# Patient Record
Sex: Female | Born: 1955 | Race: White | Hispanic: No | Marital: Married
Health system: Midwestern US, Community
[De-identification: ages and names within clinical notes are randomized; demographics above are authoritative.]

## PROBLEM LIST (undated history)

## (undated) DIAGNOSIS — M25531 Pain in right wrist: Principal | ICD-10-CM

## (undated) DIAGNOSIS — E1140 Type 2 diabetes mellitus with diabetic neuropathy, unspecified: Secondary | ICD-10-CM

## (undated) DIAGNOSIS — N952 Postmenopausal atrophic vaginitis: Secondary | ICD-10-CM

## (undated) DIAGNOSIS — E785 Hyperlipidemia, unspecified: Secondary | ICD-10-CM

## (undated) DIAGNOSIS — R928 Other abnormal and inconclusive findings on diagnostic imaging of breast: Secondary | ICD-10-CM

## (undated) DIAGNOSIS — I10 Essential (primary) hypertension: Principal | ICD-10-CM

## (undated) DIAGNOSIS — Z794 Long term (current) use of insulin: Secondary | ICD-10-CM

## (undated) DIAGNOSIS — E1165 Type 2 diabetes mellitus with hyperglycemia: Principal | ICD-10-CM

## (undated) DIAGNOSIS — E119 Type 2 diabetes mellitus without complications: Principal | ICD-10-CM

## (undated) DIAGNOSIS — K529 Noninfective gastroenteritis and colitis, unspecified: Principal | ICD-10-CM

## (undated) DIAGNOSIS — E559 Vitamin D deficiency, unspecified: Principal | ICD-10-CM

## (undated) DIAGNOSIS — G2581 Restless legs syndrome: Secondary | ICD-10-CM

## (undated) DIAGNOSIS — J340 Abscess, furuncle and carbuncle of nose: Secondary | ICD-10-CM

## (undated) DIAGNOSIS — F411 Generalized anxiety disorder: Secondary | ICD-10-CM

## (undated) DIAGNOSIS — K219 Gastro-esophageal reflux disease without esophagitis: Secondary | ICD-10-CM

## (undated) DIAGNOSIS — Z1159 Encounter for screening for other viral diseases: Secondary | ICD-10-CM

## (undated) DIAGNOSIS — R110 Nausea: Principal | ICD-10-CM

## (undated) DIAGNOSIS — J309 Allergic rhinitis, unspecified: Secondary | ICD-10-CM

## (undated) DIAGNOSIS — Z86010 Personal history of colonic polyps: Secondary | ICD-10-CM

## (undated) DIAGNOSIS — K8050 Calculus of bile duct without cholangitis or cholecystitis without obstruction: Secondary | ICD-10-CM

## (undated) DIAGNOSIS — F32A Depression, unspecified: Secondary | ICD-10-CM

## (undated) DIAGNOSIS — T7840XA Allergy, unspecified, initial encounter: Secondary | ICD-10-CM

## (undated) DIAGNOSIS — G589 Mononeuropathy, unspecified: Secondary | ICD-10-CM

## (undated) DIAGNOSIS — M1990 Unspecified osteoarthritis, unspecified site: Secondary | ICD-10-CM

## (undated) DIAGNOSIS — M797 Fibromyalgia: Secondary | ICD-10-CM

## (undated) DIAGNOSIS — G43909 Migraine, unspecified, not intractable, without status migrainosus: Secondary | ICD-10-CM

## (undated) DIAGNOSIS — F419 Anxiety disorder, unspecified: Secondary | ICD-10-CM

## (undated) DIAGNOSIS — K2970 Gastritis, unspecified, without bleeding: Secondary | ICD-10-CM

## (undated) DIAGNOSIS — M810 Age-related osteoporosis without current pathological fracture: Secondary | ICD-10-CM

## (undated) DIAGNOSIS — I509 Heart failure, unspecified: Secondary | ICD-10-CM

## (undated) DIAGNOSIS — N951 Menopausal and female climacteric states: Secondary | ICD-10-CM

## (undated) DIAGNOSIS — Z9289 Personal history of other medical treatment: Secondary | ICD-10-CM

## (undated) DIAGNOSIS — N289 Disorder of kidney and ureter, unspecified: Secondary | ICD-10-CM

## (undated) DIAGNOSIS — F329 Major depressive disorder, single episode, unspecified: Secondary | ICD-10-CM

## (undated) DIAGNOSIS — Z860101 Personal history of adenomatous and serrated colon polyps: Secondary | ICD-10-CM

## (undated) DIAGNOSIS — H8109 Meniere's disease, unspecified ear: Secondary | ICD-10-CM

## (undated) DIAGNOSIS — E538 Deficiency of other specified B group vitamins: Secondary | ICD-10-CM

## (undated) DIAGNOSIS — H8090 Unspecified otosclerosis, unspecified ear: Secondary | ICD-10-CM

## (undated) DIAGNOSIS — H8110 Benign paroxysmal vertigo, unspecified ear: Secondary | ICD-10-CM

## (undated) DIAGNOSIS — K760 Fatty (change of) liver, not elsewhere classified: Secondary | ICD-10-CM

## (undated) DIAGNOSIS — K279 Peptic ulcer, site unspecified, unspecified as acute or chronic, without hemorrhage or perforation: Secondary | ICD-10-CM

## (undated) DIAGNOSIS — R319 Hematuria, unspecified: Secondary | ICD-10-CM

## (undated) DIAGNOSIS — K449 Diaphragmatic hernia without obstruction or gangrene: Secondary | ICD-10-CM

## (undated) HISTORY — DX: Menopausal and female climacteric states: N95.1

## (undated) HISTORY — PX: CHOLECYSTECTOMY: SHX55

## (undated) HISTORY — PX: BILE DUCT EXPLORATION: SHX1225

## (undated) HISTORY — PX: COLONOSCOPY: SHX174

## (undated) HISTORY — PX: FOOT SURGERY: SHX648

## (undated) HISTORY — PX: MOUTH SURGERY: SHX715

## (undated) HISTORY — DX: Calculus of bile duct without cholangitis or cholecystitis without obstruction: K80.50

## (undated) HISTORY — DX: Benign paroxysmal vertigo, unspecified ear: H81.10

## (undated) HISTORY — PX: TUBAL LIGATION: SHX77

## (undated) HISTORY — DX: Fibromyalgia: M79.7

## (undated) HISTORY — DX: Personal history of other medical treatment: Z92.89

## (undated) HISTORY — DX: Gastro-esophageal reflux disease without esophagitis: K21.9

## (undated) HISTORY — DX: Personal history of colonic polyps: Z86.010

## (undated) HISTORY — DX: Allergy, unspecified, initial encounter: T78.40XA

## (undated) HISTORY — DX: Personal history of adenomatous and serrated colon polyps: Z86.0101

## (undated) HISTORY — DX: Hematuria, unspecified: R31.9

## (undated) HISTORY — DX: Gastritis, unspecified, without bleeding: K29.70

## (undated) HISTORY — DX: Unspecified osteoarthritis, unspecified site: M19.90

## (undated) HISTORY — DX: Depression, unspecified: F32.A

## (undated) HISTORY — PX: TOTAL ABDOMINAL HYSTERECTOMY: SHX209

## (undated) HISTORY — DX: Meniere's disease, unspecified ear: H81.09

## (undated) HISTORY — DX: Unspecified otosclerosis, unspecified ear: H80.90

## (undated) HISTORY — DX: Disorder of kidney and ureter, unspecified: N28.9

## (undated) HISTORY — DX: Migraine, unspecified, not intractable, without status migrainosus: G43.909

## (undated) HISTORY — DX: Hyperlipidemia, unspecified: E78.5

## (undated) HISTORY — DX: Anxiety disorder, unspecified: F41.9

## (undated) HISTORY — DX: Fatty (change of) liver, not elsewhere classified: K76.0

## (undated) HISTORY — DX: Allergic rhinitis, unspecified: J30.9

## (undated) HISTORY — DX: Age-related osteoporosis without current pathological fracture: M81.0

## (undated) HISTORY — DX: Diaphragmatic hernia without obstruction or gangrene: K44.9

## (undated) HISTORY — PX: BREAST ENHANCEMENT SURGERY: SHX7

## (undated) HISTORY — PX: SHOULDER SURGERY: SHX246

## (undated) HISTORY — DX: Deficiency of other specified B group vitamins: E53.8

## (undated) HISTORY — DX: Heart failure, unspecified: I50.9

## (undated) HISTORY — DX: Mononeuropathy, unspecified: G58.9

## (undated) HISTORY — DX: Peptic ulcer, site unspecified, unspecified as acute or chronic, without hemorrhage or perforation: K27.9

## (undated) HISTORY — DX: Major depressive disorder, single episode, unspecified: F32.9

---

## 2004-12-28 ENCOUNTER — Encounter: Admission: RE | Admit: 2004-12-28 | Discharge: 2004-12-28 | Payer: Self-pay | Admitting: Specialist

## 2005-06-14 ENCOUNTER — Encounter: Admission: RE | Admit: 2005-06-14 | Discharge: 2005-06-14 | Payer: Self-pay | Admitting: Neurology

## 2005-06-30 ENCOUNTER — Encounter: Admission: RE | Admit: 2005-06-30 | Discharge: 2005-06-30 | Payer: Self-pay | Admitting: Neurology

## 2005-07-04 ENCOUNTER — Ambulatory Visit: Payer: Self-pay | Admitting: Internal Medicine

## 2005-07-24 ENCOUNTER — Ambulatory Visit: Payer: Self-pay | Admitting: Internal Medicine

## 2005-07-24 ENCOUNTER — Encounter (INDEPENDENT_AMBULATORY_CARE_PROVIDER_SITE_OTHER): Payer: Self-pay | Admitting: Specialist

## 2005-07-28 LAB — HM COLONOSCOPY

## 2005-08-09 ENCOUNTER — Ambulatory Visit: Payer: Self-pay | Admitting: Internal Medicine

## 2005-09-10 ENCOUNTER — Ambulatory Visit (HOSPITAL_COMMUNITY): Admission: RE | Admit: 2005-09-10 | Discharge: 2005-09-10 | Payer: Self-pay | Admitting: Family Medicine

## 2006-03-07 ENCOUNTER — Encounter: Admission: RE | Admit: 2006-03-07 | Discharge: 2006-03-07 | Payer: Self-pay | Admitting: Neurology

## 2006-03-26 ENCOUNTER — Encounter: Admission: RE | Admit: 2006-03-26 | Discharge: 2006-03-26 | Payer: Self-pay | Admitting: Neurology

## 2007-06-10 ENCOUNTER — Encounter: Admission: RE | Admit: 2007-06-10 | Discharge: 2007-06-10 | Payer: Self-pay | Admitting: Family Medicine

## 2007-07-17 ENCOUNTER — Ambulatory Visit: Payer: Self-pay | Admitting: Internal Medicine

## 2007-07-19 ENCOUNTER — Ambulatory Visit (HOSPITAL_COMMUNITY): Admission: RE | Admit: 2007-07-19 | Discharge: 2007-07-19 | Payer: Self-pay | Admitting: Internal Medicine

## 2007-07-23 ENCOUNTER — Ambulatory Visit: Payer: Self-pay | Admitting: Internal Medicine

## 2007-07-23 LAB — CONVERTED CEMR LAB
ALT: 32 units/L (ref 0–35)
AST: 23 units/L (ref 0–37)
Albumin: 4.3 g/dL (ref 3.5–5.2)
Alkaline Phosphatase: 64 units/L (ref 39–117)
Basophils Absolute: 0 10*3/uL (ref 0.0–0.1)
Basophils Relative: 0.4 % (ref 0.0–1.0)
Bilirubin, Direct: 0.1 mg/dL (ref 0.0–0.3)
Eosinophils Absolute: 0 10*3/uL (ref 0.0–0.6)
Eosinophils Relative: 0.3 % (ref 0.0–5.0)
HCT: 41.6 % (ref 36.0–46.0)
Hemoglobin: 14.5 g/dL (ref 12.0–15.0)
INR: 0.8 (ref 0.8–1.0)
Iron: 91 ug/dL (ref 42–145)
Lymphocytes Relative: 33.3 % (ref 12.0–46.0)
MCHC: 34.9 g/dL (ref 30.0–36.0)
MCV: 93.4 fL (ref 78.0–100.0)
Monocytes Absolute: 0.5 10*3/uL (ref 0.2–0.7)
Monocytes Relative: 9.8 % (ref 3.0–11.0)
Neutro Abs: 3.2 10*3/uL (ref 1.4–7.7)
Neutrophils Relative %: 56.2 % (ref 43.0–77.0)
Platelets: 233 10*3/uL (ref 150–400)
Prothrombin Time: 10.8 s — ABNORMAL LOW (ref 10.9–13.3)
RBC: 4.46 M/uL (ref 3.87–5.11)
RDW: 12.5 % (ref 11.5–14.6)
Saturation Ratios: 20.2 % (ref 20.0–50.0)
Total Bilirubin: 1 mg/dL (ref 0.3–1.2)
Total Protein: 7 g/dL (ref 6.0–8.3)
Transferrin: 322.5 mg/dL (ref >=212.0)
WBC: 5.5 10*3/uL (ref 4.5–10.5)

## 2007-07-30 ENCOUNTER — Encounter: Payer: Self-pay | Admitting: Internal Medicine

## 2007-07-30 ENCOUNTER — Ambulatory Visit: Payer: Self-pay | Admitting: Internal Medicine

## 2007-07-30 DIAGNOSIS — K2900 Acute gastritis without bleeding: Secondary | ICD-10-CM | POA: Insufficient documentation

## 2007-09-21 DIAGNOSIS — F329 Major depressive disorder, single episode, unspecified: Secondary | ICD-10-CM | POA: Insufficient documentation

## 2007-09-21 DIAGNOSIS — E785 Hyperlipidemia, unspecified: Secondary | ICD-10-CM | POA: Insufficient documentation

## 2007-09-21 DIAGNOSIS — K449 Diaphragmatic hernia without obstruction or gangrene: Secondary | ICD-10-CM | POA: Insufficient documentation

## 2007-09-21 DIAGNOSIS — D126 Benign neoplasm of colon, unspecified: Secondary | ICD-10-CM | POA: Insufficient documentation

## 2007-09-21 DIAGNOSIS — F411 Generalized anxiety disorder: Secondary | ICD-10-CM | POA: Insufficient documentation

## 2007-09-21 DIAGNOSIS — Z8719 Personal history of other diseases of the digestive system: Secondary | ICD-10-CM | POA: Insufficient documentation

## 2007-09-21 DIAGNOSIS — F3289 Other specified depressive episodes: Secondary | ICD-10-CM | POA: Insufficient documentation

## 2007-09-21 DIAGNOSIS — K219 Gastro-esophageal reflux disease without esophagitis: Secondary | ICD-10-CM | POA: Insufficient documentation

## 2008-02-17 ENCOUNTER — Ambulatory Visit: Payer: Self-pay | Admitting: Family Medicine

## 2008-06-10 ENCOUNTER — Encounter: Admission: RE | Admit: 2008-06-10 | Discharge: 2008-06-10 | Payer: Self-pay | Admitting: Family Medicine

## 2009-02-25 ENCOUNTER — Ambulatory Visit: Payer: Self-pay | Admitting: Family Medicine

## 2009-03-11 ENCOUNTER — Ambulatory Visit: Payer: Self-pay | Admitting: Family Medicine

## 2009-06-11 ENCOUNTER — Encounter: Admission: RE | Admit: 2009-06-11 | Discharge: 2009-06-11 | Payer: Self-pay | Admitting: Family Medicine

## 2009-09-14 ENCOUNTER — Telehealth: Payer: Self-pay | Admitting: Internal Medicine

## 2009-10-26 ENCOUNTER — Ambulatory Visit: Payer: Self-pay | Admitting: Internal Medicine

## 2009-10-26 DIAGNOSIS — R197 Diarrhea, unspecified: Secondary | ICD-10-CM | POA: Insufficient documentation

## 2010-06-02 ENCOUNTER — Encounter: Payer: Self-pay | Admitting: Internal Medicine

## 2010-06-13 ENCOUNTER — Encounter: Admission: RE | Admit: 2010-06-13 | Discharge: 2010-06-13 | Payer: Self-pay | Admitting: Family Medicine

## 2010-06-22 ENCOUNTER — Encounter (INDEPENDENT_AMBULATORY_CARE_PROVIDER_SITE_OTHER): Payer: Self-pay | Admitting: *Deleted

## 2010-06-23 ENCOUNTER — Ambulatory Visit: Payer: Self-pay | Admitting: Internal Medicine

## 2010-06-23 ENCOUNTER — Encounter: Admission: RE | Admit: 2010-06-23 | Discharge: 2010-06-23 | Payer: Self-pay | Admitting: Family Medicine

## 2010-07-05 ENCOUNTER — Ambulatory Visit: Payer: Self-pay | Admitting: Internal Medicine

## 2010-07-07 ENCOUNTER — Encounter: Payer: Self-pay | Admitting: Internal Medicine

## 2010-09-19 ENCOUNTER — Encounter: Payer: Self-pay | Admitting: Family Medicine

## 2010-09-27 NOTE — Letter (Signed)
Summary: [facility] Instructions  [facility]  [address] [place], [place] [address]   Phone: [phone]  Fax: [phone]       Dorothy [name]    Mar 28, 1956    MRN: [id number]       Procedure Day Dorothy [name]:  Dorothy [name]  07/05/10     Arrival Time: 8:30AM     Procedure Time:  9:30AM     Location of Procedure:                    Dorothy [name]  [facility] (4th Floor)  PREPARATION FOR COLONOSCOPY WITH MIRALAX  Starting 5 days prior to your procedure 06/30/10 do not eat nuts, seeds, popcorn, corn, beans, peas,  salads, or any raw vegetables.  Do not take any fiber supplements (e.g. Metamucil, Citrucel, and Benefiber). ____________________________________________________________________________________________________   THE DAY BEFORE YOUR PROCEDURE         DATE: 07/04/10   DAY: MONDAY  1   Drink clear liquids the entire day-NO SOLID FOOD  2   Do not drink anything colored red or purple.  Avoid juices with pulp.  No orange juice.  3   Drink at least 64 oz. (8 glasses) of fluid/clear liquids during the day to prevent dehydration and help the prep work efficiently.  CLEAR LIQUIDS INCLUDE: Water Jello Ice Popsicles Tea (sugar ok, no milk/cream) Powdered fruit flavored drinks Coffee (sugar ok, no milk/cream) Gatorade Juice: apple, white grape, white cranberry  Lemonade Clear bullion, consomm, broth Carbonated beverages (any kind) Strained chicken noodle soup Hard Candy  4   Mix the entire bottle of Miralax with 64 oz. of Gatorade/Powerade in the morning and put in the refrigerator to chill.  5   At 3:00 pm take 2 Dulcolax/Bisacodyl tablets.  6   At 4:30 pm take one Reglan/Metoclopramide tablet.  7  Starting at 5:00 pm drink one 8 oz glass of the Miralax mixture every 15-20 minutes until you have finished drinking the entire 64 oz.  You should finish drinking prep around 7:30 or 8:00 pm.  8   If you are nauseated, you may take the 2nd Reglan/Metoclopramide tablet  at 6:30 pm.        9    At 8:00 pm take 2 more DULCOLAX/Bisacodyl tablets.     THE DAY OF YOUR PROCEDURE      DATE:  07/05/10   DAY: Dorothy [name]  You may drink clear liquids until 7:30AM (2 HOURS BEFORE PROCEDURE).   MEDICATION INSTRUCTIONS  Unless otherwise instructed, you should take regular prescription medications with a small sip of water as early as possible the morning of your procedure.         OTHER INSTRUCTIONS  You will need a responsible adult at least 54 years of age to accompany you and drive you home.   This person must remain in the waiting room during your procedure.  Wear loose fitting clothing that is easily removed.  Leave jewelry and other valuables at home.  However, you may wish to bring a book to read or an iPod/MP3 player to listen to music as you wait for your procedure to start.  Remove all body piercing jewelry and leave at home.  Total time from sign-in until discharge is approximately 2-3 hours.  You should go home directly after your procedure and rest.  You can resume normal activities the day after your procedure.  The day of your procedure you should not:   Drive  Make legal decisions   Operate machinery   Drink alcohol   Return to work  You will receive specific instructions about eating, activities and medications before you leave.   The above instructions have been reviewed and explained to me by   Dorothy [name], RN_____________________    I fully understand and can verbalize these instructions _____________________________ Date _______

## 2010-09-27 NOTE — Progress Notes (Signed)
Summary: Med refill-scheduled her rov  Medications Added LOPERAMIDE HCL 2 MG  TABS (LOPERAMIDE HCL) Take 1 tablet two times a day as needed diarrhea       Phone Note Call from Patient Call back at Home Phone [phone]   Call For: Dr. Juanda Chance Summary of Call: Scheduled rov-next available 3-1. Can we refill her medicine to hold her over until then? Initial call taken by: Leanor Kail [facility],  September 14, 2009 12:38 PM  Follow-up for Phone Call        Advised patient that I would not be giving refills until she is actually seen in the office as I have denied the medication on 5 different occasions. Patient was very understanding, however she states that she was only advised yesterday that she needed an office visit. She was told the other times that we did not respond to refill requests. In that case, I told the patient that I will give her refills until March 1 appt. Follow-up by: Hortense Ramal CMA Duncan Dull),  September 14, 2009 1:34 PM    New/Updated Medications: LOPERAMIDE HCL 2 MG  TABS (LOPERAMIDE HCL) Take 1 tablet two times a day as needed diarrhea Prescriptions: LOPERAMIDE HCL 2 MG  TABS (LOPERAMIDE HCL) Take 1 tablet two times a day as needed diarrhea  #60 x 1   Entered by:   Hortense Ramal CMA (AAMA)   Authorized by:   Hart Carwin MD   Signed by:   Hortense Ramal CMA (AAMA) on 09/14/2009   Method used:   [facility]  [address] [phone][facility]       [address]       [place], [place]  [address]       Ph: [phone]       Fax: [phone]   RxID:   [id number]

## 2010-09-27 NOTE — Letter (Signed)
Summary: Pre Visit Letter Revised  [facility]  [address] [place], [place] [address]   Phone: [phone]  Fax: [phone]        06/02/2010 MRN: [id number] [facility] [address] [place], [place]  [address]             Procedure Date:  11-8 at 9:30am  Welcome to the Gastroenterology Division at [facility].    You are scheduled to see a nurse for your pre-procedure visit on 06-23-10 at 11am on the 3rd floor at [facility], [address]  We ask that you try to arrive at our office 15 minutes prior to your appointment time to allow for check-in.  Please take a minute to review the attached form.  If you answer "Yes" to one or more of the questions on the first page, we ask that you call the person listed at your earliest opportunity.  If you answer "No" to all of the questions, please complete the rest of the form and bring it to your appointment.    Your nurse visit will consist of discussing your medical and surgical history, your immediate family medical history, and your medications.   If you are unable to list all of your medications on the form, please bring the medication bottles to your appointment and we will list them.  We will need to be aware of both prescribed and over the counter drugs.  We will need to know exact dosage information as well.    Please be prepared to read and sign documents such as consent forms, a financial agreement, and acknowledgement forms.  If necessary, and with your consent, a friend or relative is welcome to sit-in on the nurse visit with you.  Please bring your insurance card so that we may make a copy of it.  If your insurance requires a referral to see a specialist, please bring your referral form from your primary care physician.  No co-pay is required for this nurse visit.     If you cannot keep your appointment, please call [phone] to cancel or reschedule prior to your appointment date.  This  allows [place] the opportunity to schedule an appointment for another patient in need of care.    Thank you for choosing [facility] for your medical needs.  We appreciate the opportunity to care for you.  Please visit [place] at our website  to learn more about our practice.  Sincerely, The Gastroenterology Division

## 2010-09-27 NOTE — Assessment & Plan Note (Signed)
Summary: LOPEDRINE REFILLYF    History of Present Illness Visit Type: Follow-up Visit Primary GI MD: [name] MD Primary Provider: Belva Bertin Requesting Provider: n/a Chief Complaint: Lopermide refills, Bowel habit changes with some gas, some rectal itching History of Present Illness:   This is a 55 year old white female with irritable bowel syndrome and postcholecystectomy diarrhea. She needs refills on her Imodium which she takes every other day. There is a positive family history of colon cancer in her maternal cousin at the age of 103. She has a personal history of colon polyps. Her last colonoscopy was in November 2006. Patient is status post cholecystectomy in 2002 resulting in diarrhea. She had an ERCP for a common bile duct stone removal. 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 An upper endoscopy in 1999 in 2001confirmed gastroesophageal reflux. 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 Her last upper endoscopy in December 2008 showed gastritis.   GI Review of Systems    Reports abdominal pain.     Location of  Abdominal pain: RUQ.    Denies acid reflux, belching, bloating, chest pain, dysphagia with liquids, dysphagia with solids, heartburn, loss of appetite, nausea, vomiting, vomiting blood, weight loss, and  weight gain.      Reports change in bowel habits and  rectal bleeding.     Denies anal fissure, black tarry stools, constipation, diarrhea, diverticulosis, fecal incontinence, heme positive stool, hemorrhoids, irritable bowel syndrome, jaundice, light color stool, liver problems, and  rectal pain. Preventive Screening-Counseling & Management  Alcohol-Tobacco     Smoking Status: never      Drug Use:  no.      Current Medications (verified): 1)  Loperamide Hcl 2 Mg  Tabs (Loperamide Hcl) .... Take 1 Tablet Two Times A Day As Needed Diarrhea 2)  Paxil  20 Mg Tabs (Paroxetine Hcl) .[name].. 1 By Mouth Once Daily 3)  Metformin Hcl 500 Mg Tabs (Metformin Hcl) .[name].. 1 By Mouth Two Times A Day 4)  Zocor 40 Mg Tabs (Simvastatin) .[name].. 1 By Mouth Once Daily 5)  Nexium 40 Mg Cpdr (Esomeprazole Magnesium) .[name].. 1 By Mouth Once Daily 6)  Dyazide 37.5-25 Mg Caps (Triamterene-Hctz) .[name].. 1 By Mouth Two Times A Day 7)  Premarin 1.25 Mg Tabs (Estrogens Conjugated) .[name].. 1 By Mouth Once Daily 8)  Aspirin 81 Mg Tbec (Aspirin) .[name].. 1 By Mouth Once Daily 9)  Vitamin D3 1000 Unit Caps (Cholecalciferol) .... 2000iu Once Daily 10)  Calcium-Vitamin D 500-200 Mg-Unit Tabs (Calcium Carbonate-Vitamin D) .[name].. 1 By Mouth Once Daily 11)  Cats Claw 500 Mg Caps (Cats Claw (Uncaria Tomentosa)) .[name].. 1 By Mouth Once Daily 12)  Lyrica 50 Mg Caps (Pregabalin) .[name].. 1 By Mouth Two Times A Day  Allergies (verified): 1)  ! Codeine 2)  ! Talwin 3)  ! Macrobid 4)  ! Bactrim  Past History:  Past Medical History: Current Problems:  GASTRITIS, ACUTE (ICD-535.00) HYPERLIPIDEMIA (ICD-272.4) DEPRESSION (ICD-311) ANXIETY (ICD-300.00) CHOLEDOCHOLITHIASIS, HX OF (ICD-V12.79) GERD (ICD-530.81) HIATAL HERNIA (ICD-553.3) COLONIC POLYPS, ADENOMATOUS (ICD-211.3) DIABETES MELLITUS (ICD-250.00) Fibromyalgia  Past Surgical History: Reviewed history from 09/21/2007 and no changes required. Cholecystectomy Total Abdominal Hysterectomy Tubal ligation left shoulder surgery  Family History: Family History of Colon Cancer: 1st cousin died at age 54 Family History of Prostate Cancer: father Family History of Breast Cancer:aunt  Social History: Patient has never smoked.  Alcohol Use - yes  1 a month Illicit Drug Use - no No Caffeine Occupation:  Receptionist Smoking Status:  never Drug Use:  no  Review of Systems       The patient complains of arthritis/joint pain, back pain, and fatigue.  The patient denies allergy/sinus, anemia, anxiety-new, blood in urine, breast changes/lumps,  change in vision, confusion, cough, coughing up blood, depression-new, fainting, fever, headaches-new, hearing problems, heart murmur, heart rhythm changes, itching, menstrual pain, muscle pains/cramps, night sweats, nosebleeds, pregnancy symptoms, shortness of breath, skin rash, sleeping problems, sore throat, swelling of feet/legs, swollen lymph glands, thirst - excessive , urination - excessive , urination changes/pain, urine leakage, vision changes, and voice change.         with urination tends to have a bowel movement  Vital Signs:  Patient profile:   55 year old female Height:      64 inches Weight:      180 pounds BMI:     31.01 BSA:     1.87 Pulse rate:   64 / minute Pulse rhythm:   irregular BP sitting:   118 / 72  (left arm)  Vitals Entered By: Merri Ray CMA Duncan Dull) (October 26, 2009 1:48 PM)  Physical Exam  General:  Well developed, well nourished, no acute distress. Mouth:  No deformity or lesions, dentition normal. Neck:  Supple; no masses or thyromegaly. Lungs:  Clear throughout to auscultation. Heart:  Regular rate and rhythm; no murmurs, rubs,  or bruits. Abdomen:  soft abdomen with tenderness and left lower quadrant. No palpable mass or rebound. Liver edge at costal margin. Post laparoscopic cholecystectomy scars. No bruit. Rectal:  normal rectal tone. Stool is soft and Hemoccult-negative. Extremities:  No clubbing, cyanosis, edema or deformities noted. Skin:  Intact without significant lesions or rashes. Psych:  Alert and cooperative. Normal mood and affect.   Impression & Recommendations:  Problem # 1:  DIARRHEA, CHRONIC (ICD-787.91) Patient has chronic low-grade diarrhea due to prior cholecystectomy as well as to decreased rectal sphincter tone and diabetic neuropathy. She is improved due to dietary modifications. We will refill her Imodium. I advised to take Benefiber 1 tablespoon daily and gave her samples of a probiotic to take one a day. She is due for a  recall colonoscopy in November 2011. For, rectal irritation she received Calmoseptine ointment.  Problem # 2:  COLONIC POLYPS, ADENOMATOUS (ICD-211.3) Patient had adenomatous and hyperplastic polyps in 2006. She also has a family history of colon cancer in a cousin. She is due for a colonoscopy in November 2011.  Patient Instructions: 1)  refill Imodium to take p.r.n. 2)  High-fiber diet. 3)  Calmoseptine ointment p.r.n. rectal irritation. 4)  Probiotic one a day. 5)  Benefiber 1 tablespoon daily. 6)  Copy sent to : Dr Kevin Fenton 7)  The medication list was reviewed and reconciled.  All changed / newly prescribed medications were explained.  A complete medication list was provided to the patient / caregiver. Prescriptions: LOPERAMIDE HCL 2 MG  TABS (LOPERAMIDE HCL) Take 1 tablet two times a day as needed diarrhea  #60 x 4   Entered by:   Hortense Ramal CMA (AAMA)   Authorized by:   Hart Carwin MD   Signed by:   Hortense Ramal CMA (AAMA) on 10/26/2009   Method used:   [facility]  [address] [phone]       [address]  [place], [place]  [address]       Ph: [phone]       Fax: [phone]   RxID:   [id number]

## 2010-09-27 NOTE — Miscellaneous (Signed)
Summary: RECALLC OLON/YF  Clinical Lists Changes  Medications: Added new medication of MIRALAX   POWD (POLYETHYLENE GLYCOL 3350) As directed - Signed Added new medication of REGLAN 10 MG  TABS (METOCLOPRAMIDE HCL) As directed - Signed Added new medication of DULCOLAX 5 MG  TBEC (BISACODYL) As directed - Signed Rx of MIRALAX   POWD (POLYETHYLENE GLYCOL 3350) As directed;  #255 gms x 0;  Signed;  Entered by: Clide Cliff RN;  Authorized by: Hart Carwin MD;  Method used: [id number] [address] [place], [place], [place]  [address], Ph: [phone], Fax: [phone] Rx of REGLAN 10 MG  TABS (METOCLOPRAMIDE HCL) As directed;  #2 x 0;  Signed;  Entered by: Clide Cliff RN;  Authorized by: Hart Carwin MD;  Method used: [id number] [address] [place], [place], [place]  [address], Ph: [phone], Fax: [phone] Rx of DULCOLAX 5 MG  TBEC (BISACODYL) As directed;  #4 x 0;  Signed;  Entered by: Clide Cliff RN;  Authorized by: Hart Carwin MD;  Method used: [facility] [phone]*, [address], [place], [place], [place]  [address], Ph: [phone], Fax: [phone] Observations: Added new observation of ALLERGY REV: Done (06/23/2010 11:27)    Prescriptions: DULCOLAX 5 MG  TBEC (BISACODYL) As directed  #4 x 0   Entered by:   Clide Cliff RN   Authorized by:   Hart Carwin MD   Signed by:   Clide Cliff RN on 06/23/2010   Method used:   [facility]        [facility]  [address] [phone]* (retail)       [address]       [place], [place]  [address]       Ph: [phone]       Fax: [phone]   RxID:   [id number] REGLAN 10 MG  TABS (METOCLOPRAMIDE HCL) As directed  #2 x 0   Entered by:   Clide Cliff RN   Authorized by:   Hart Carwin MD   Signed by:   Clide Cliff RN on 06/23/2010   Method used:   [facility]        [facility]   [address] [phone]* (retail)       [address]       [place], [place]  [address]       Ph: [phone]       Fax: [phone]   RxID:   [id number] MIRALAX   POWD (POLYETHYLENE GLYCOL 3350) As directed  #255 gms x 0   Entered by:   Clide Cliff RN   Authorized by:   Hart Carwin MD   Signed by:   Clide Cliff RN on 06/23/2010   Method used:   [facility]        [facility]  [address] [phone]* (retail)       [address]       [place], [place]  [address]       Ph: [phone]       Fax: [phone]   RxID:   [id number]

## 2010-09-27 NOTE — Procedures (Signed)
Summary: Colonoscopy  Patient: Dorothy [name] Note: All result statuses are Final unless otherwise noted.  Tests: (1) Colonoscopy (COL)   COL Colonoscopy           DONE     [facility]     [address]     [place], [place]  [address]           COLONOSCOPY PROCEDURE REPORT           PATIENT:  Dorothy, [name]  MR#:  [id number]     BIRTHDATE:  Aug 16, 1956, 54 yrs. old  GENDER:  female     ENDOSCOPIST:  [name]. Juanda Chance, MD     REF. BY:  Nilda Simmer, M.D.     PROCEDURE DATE:  07/05/2010     PROCEDURE:  Colonoscopy [address]     ASA CLASS:  Class II     INDICATIONS:  history of hyperplastic polyps colon 2006     MEDICATIONS:   Versed 7 mg, Fentanyl 50 mcg           DESCRIPTION OF PROCEDURE:   After the risks benefits and     alternatives of the procedure were thoroughly explained, informed     consent was obtained.  Digital rectal exam was performed and     revealed no rectal masses.   The LB [id number] endoscope     was introduced through the anus and advanced to the cecum, which     was identified by both the appendix and ileocecal valve, without     limitations.  The quality of the prep was good, using MiraLax.     The instrument was then slowly withdrawn as the colon was fully     examined.     <<PROCEDUREIMAGES>>           FINDINGS:  Two polyps were found in the sigmoid colon. at 50 cm     33-4 mm flat polyps removed, only one retrieved Polyp was snared     without cautery. Retrieval was unsuccessful. snare polyp The polyp     was removed using cold biopsy forceps (see image4 and image3).     This was otherwise a normal examination of the colon (see image5,     image2, and image1).   Retroflexed views in the rectum revealed no     abnormalities.    The scope was then withdrawn from the patient     and the procedure completed.           COMPLICATIONS:  None     ENDOSCOPIC IMPRESSION:     1) Two polyps in the sigmoid colon     2) Otherwise normal examination  RECOMMENDATIONS:     1) Await pathology results     2) High fiber diet.     REPEAT EXAM:  In 10 year(s) for.           ______________________________     [name]. Juanda Chance, MD           CC:           n.     eSIGNED:   [name]. Brodie at 07/05/2010 10:23 AM           Donetta Potts, [phone]  Note: An exclamation mark (!) indicates a result that was not dispersed into the flowsheet. Document Creation Date: 07/05/2010 10:23 AM _______________________________________________________________________  (1) Order result status: Final Collection or observation date-time: 07/05/2010 10:14 Requested date-time:  Receipt date-time:  Reported  date-time:  Referring Physician:   Ordering Physician: [name] [phone]) Specimen Source:  Source: Launa Grill Order Number: [id number] Lab site:   Appended Document: Colonoscopy     Procedures Next Due Date:    Colonoscopy: 06/2020

## 2010-09-27 NOTE — Letter (Signed)
Summary: Patient Notice- Polyp Results  [facility]  [address] [place], [place] [address]   Phone: [phone]  Fax: [phone]        July 07, 2010 MRN: [id number]    [facility] [address] [place], [place]  [address]    Dear Ms. Vajda,  I am pleased to inform you that the colon polyp(s) removed during your recent colonoscopy was (were) found to be benign (no cancer detected) upon pathologic examination.  I recommend you have a repeat colonoscopy examination in 10_ years to look for recurrent polyps, as having colon polyps increases your risk for having recurrent polyps or even colon cancer in the future.  Should you develop new or worsening symptoms of abdominal pain, bowel habit changes or bleeding from the rectum or bowels, please schedule an evaluation with either your primary care physician or with me.  Additional information/recommendations:  _x_ No further action with gastroenterology is needed at this time. Please      follow-up with your primary care physician for your other healthcare      needs.  __ Please call [phone] to schedule a return visit to review your      situation.  __ Please keep your follow-up visit as already scheduled.  __ Continue treatment plan as outlined the day of your exam.  Please call us if you are having persistent problems or have questions about your condition that have not been fully answered at this time.  Sincerely,  Hart Carwin MD  This letter has been electronically signed by your physician.  Appended Document: Patient Notice- Polyp Results letter mailed 11.15.2011

## 2010-11-08 LAB — GLUCOSE, CAPILLARY
Glucose-Capillary: 110 mg/dL — ABNORMAL HIGH (ref 70–99)
Glucose-Capillary: 152 mg/dL — ABNORMAL HIGH (ref 70–99)

## 2010-11-24 LAB — HM PAP SMEAR

## 2011-01-10 NOTE — Assessment & Plan Note (Signed)
[name]                         GASTROENTEROLOGY OFFICE NOTE   [name], [name]                        MRN:          [id number]  DATE:07/17/2007                            DOB:          [birth year]    NEW PATIENT EVALUATION:  Dorothy [name] is a 55 year old white female with diabetes, whom we saw in  the past for diarrhea and abnormal bowel habits.  There was also a  family history of colon cancer in an indirect relative.  On colonoscopy  November 2006 she was found to have an adenomatous polyp.  She is here  today because of symptoms of right upper quadrant discomfort and  dyspepsia.  She has had symptoms of gastroesophageal reflux for at least  10 years and underwent upper endoscopy in [place] in 1999 and again  at [place] in [place] in 2001.  She was told to have a hiatal  hernia and has stated on proton pump inhibitor continuously.  Her Nexium  was upped from 20 mg a day to 40 mg a day last week.  She denies any  dysphagia or odynophagia.  She has not had any vomiting.  She has  diarrhea and urgency almost each meal, usually breakfast and lunch.  There has been no rectal bleeding.  Some of the right upper quadrant  abdominal pain is associated with movement such as sudden turning or  reaching.  It radiates all the way to the back.  After her  cholecystectomy in 2003 she had to undergo ERCP and removal of a common  bile duct stone about 3 months after the surgery.  She has not had any  problems since then.   PAST HISTORY:  1. Diabetes x3 years.  2. Hyperlipidemia.  3. Anxiety.  4. Depression.   OPERATIONS:  1. Cholecystectomy in 2003.  2. Hysterectomy in 1985.  3. Tubal ligation in 1980.  4. Left shoulder surgery December 2000.   FAMILY HISTORY:  Significant for heart disease in father, uncle and  grandfather; prostate cancer in father; breast cancer in aunt; diabetes  in mother, father and brother as well as  grandmother.   SOCIAL HISTORY:  Married with three children.  She is a Loss adjuster, chartered.  She does not smoke and drinks alcohol only occasionally.   REVIEW OF SYSTEMS:  Positive for allergies, arthritic complaints, night  sweats, back pain, severe fatigue.   PHYSICAL EXAM:  Blood pressure 134/98, pulse 96 and weight 172 pounds.  She was alert, oriented, in no distress.  Neck was supple, no lymphadenopathy.  Sclerae are nonicteric.  Oral  cavity normal.  LUNGS:  Clear to auscultation.  CARDIAC:  Normal S1, normal S2.  ABDOMEN:  Soft with tenderness along the right costal margin radiating  laterally.  Her liver was palpable at right upper quadrant at the costal  margin.  It was tender.  The left upper quadrant was unremarkable.  I  could also elicit some tenderness in the right lower quadrant overlying  the cecum.  There was no distention, and bowel sounds were normoactive.  RECTAL:  Exam not done.  EXTREMITIES:  No edema.   IMPRESSION:  37. A 55 year old white female with right upper quadrant discomfort and      dyspepsia.  The right upper quadrant discomfort may be related to      steatosis of the liver resulting from diabetes, causing some mild      hepatomegaly.  It also could be related to an irritable bowel      syndrome causing hepatic flexure syndrome, tenderness along her      right colon, which I have been able to confirm on physical exam.  2. Chronic dyspepsia and gastroesophageal reflux.  She was told to      have a hiatal hernia.  It has not been adequately controlled on      proton pump inhibitor.  Rule out Helicobacter pylori gastropathy.      Rule out gastroparesis causing delayed gastric emptying and      secondary reflux.  3. Irritable bowel syndrome with diarrhea.  Some of this may be post      cholecystectomy diarrhea.  4. History of common bile duct stone, status post cholecystectomy and      endoscopic retrograde cholangiopancreatography in  [place], [place].   PLAN:  1. Upper endoscopy scheduled.  2. Increase Nexium to 40 mg a day.  3. Refill for loperamide 2 mg to take one or two a day.  This has      worked for her for the past several years.  4. Upper abdominal ultrasound with attention to the liver to assess      for steatosis.     [name]. Juanda Chance, MD  Electronically Signed    DMB/MedQ  DD: 07/17/2007  DT: 07/17/2007  Job #: [id number]   cc:   Nilda Simmer, M.D.

## 2011-02-23 ENCOUNTER — Ambulatory Visit: Payer: Self-pay | Admitting: Family Medicine

## 2011-03-07 ENCOUNTER — Other Ambulatory Visit: Payer: Self-pay | Admitting: Internal Medicine

## 2011-05-15 ENCOUNTER — Other Ambulatory Visit: Payer: Self-pay | Admitting: Family Medicine

## 2011-05-15 DIAGNOSIS — Z1231 Encounter for screening mammogram for malignant neoplasm of breast: Secondary | ICD-10-CM

## 2011-06-26 ENCOUNTER — Ambulatory Visit
Admission: RE | Admit: 2011-06-26 | Discharge: 2011-06-26 | Disposition: A | Discharge status: Home / Self Care | Payer: 59 | Source: Ambulatory Visit | Attending: Family Medicine | Admitting: Family Medicine

## 2011-06-26 ENCOUNTER — Other Ambulatory Visit: Payer: Self-pay | Admitting: Family Medicine

## 2011-06-26 DIAGNOSIS — Z1231 Encounter for screening mammogram for malignant neoplasm of breast: Secondary | ICD-10-CM

## 2011-06-26 LAB — HM MAMMOGRAPHY: HM Mammogram: NORMAL

## 2011-08-01 LAB — HM DEXA SCAN

## 2011-08-09 ENCOUNTER — Ambulatory Visit (INDEPENDENT_AMBULATORY_CARE_PROVIDER_SITE_OTHER): Payer: 59

## 2011-08-09 DIAGNOSIS — J111 Influenza due to unidentified influenza virus with other respiratory manifestations: Secondary | ICD-10-CM

## 2011-09-08 ENCOUNTER — Encounter: Payer: Self-pay | Admitting: *Deleted

## 2011-09-20 ENCOUNTER — Ambulatory Visit: Payer: 59 | Admitting: Internal Medicine

## 2011-09-29 ENCOUNTER — Ambulatory Visit: Payer: Self-pay | Admitting: Family Medicine

## 2011-12-14 ENCOUNTER — Ambulatory Visit: Payer: Self-pay | Admitting: Family Medicine

## 2011-12-14 LAB — HM PAP SMEAR

## 2012-04-09 ENCOUNTER — Other Ambulatory Visit: Payer: Self-pay | Admitting: Family Medicine

## 2012-04-09 DIAGNOSIS — Z9882 Breast implant status: Secondary | ICD-10-CM

## 2012-04-09 DIAGNOSIS — Z1231 Encounter for screening mammogram for malignant neoplasm of breast: Secondary | ICD-10-CM

## 2012-05-16 ENCOUNTER — Encounter: Payer: Self-pay | Admitting: Internal Medicine

## 2012-05-20 ENCOUNTER — Encounter: Payer: Self-pay | Admitting: *Deleted

## 2012-05-21 ENCOUNTER — Encounter: Payer: Self-pay | Admitting: Family Medicine

## 2012-06-03 ENCOUNTER — Other Ambulatory Visit: Payer: Self-pay | Admitting: Family Medicine

## 2012-06-03 NOTE — Telephone Encounter (Signed)
Chart pulled to PA pool at nurses station [phone]

## 2012-06-11 ENCOUNTER — Encounter: Payer: Self-pay | Admitting: Family Medicine

## 2012-06-11 ENCOUNTER — Ambulatory Visit (INDEPENDENT_AMBULATORY_CARE_PROVIDER_SITE_OTHER): Payer: 59 | Admitting: Family Medicine

## 2012-06-11 ENCOUNTER — Other Ambulatory Visit: Payer: Self-pay | Admitting: *Deleted

## 2012-06-11 VITALS — BP 134/84 | HR 68 | Temp 97.9°F | Resp 16 | Ht 65.0 in | Wt 176.0 lb

## 2012-06-11 DIAGNOSIS — I10 Essential (primary) hypertension: Secondary | ICD-10-CM

## 2012-06-11 DIAGNOSIS — E7800 Pure hypercholesterolemia, unspecified: Secondary | ICD-10-CM

## 2012-06-11 DIAGNOSIS — R197 Diarrhea, unspecified: Secondary | ICD-10-CM

## 2012-06-11 DIAGNOSIS — E119 Type 2 diabetes mellitus without complications: Secondary | ICD-10-CM

## 2012-06-11 DIAGNOSIS — Z23 Encounter for immunization: Secondary | ICD-10-CM

## 2012-06-11 DIAGNOSIS — J309 Allergic rhinitis, unspecified: Secondary | ICD-10-CM

## 2012-06-11 DIAGNOSIS — Z780 Asymptomatic menopausal state: Secondary | ICD-10-CM

## 2012-06-11 LAB — COMPREHENSIVE METABOLIC PANEL
AST: 26 U/L (ref 0–37)
Albumin: 4.1 g/dL (ref 3.5–5.2)
Alkaline Phosphatase: 49 U/L (ref 39–117)
BUN: 18 mg/dL (ref 6–23)
Glucose, Bld: 139 mg/dL — ABNORMAL HIGH (ref 70–99)
Potassium: 3.6 mEq/L (ref 3.5–5.3)
Sodium: 137 mEq/L (ref 135–145)
Total Bilirubin: 0.5 mg/dL (ref 0.3–1.2)

## 2012-06-11 LAB — CBC WITH DIFFERENTIAL/PLATELET
Basophils Absolute: 0 K/uL (ref 0.0–0.1)
Basophils Relative: 1 % (ref 0–1)
Eosinophils Absolute: 0.1 K/uL (ref 0.0–0.7)
Eosinophils Relative: 3 % (ref 0–5)
HCT: 37.4 % (ref 36.0–46.0)
Hemoglobin: 12.6 g/dL (ref 12.0–15.0)
Lymphocytes Relative: 28 % (ref 12–46)
Lymphs Abs: 1.2 K/uL (ref 0.7–4.0)
MCH: 30.6 pg (ref 26.0–34.0)
MCHC: 33.7 g/dL (ref 30.0–36.0)
MCV: 90.8 fL (ref 78.0–100.0)
Monocytes Absolute: 0.3 K/uL (ref 0.1–1.0)
Monocytes Relative: 8 % (ref 3–12)
Neutro Abs: 2.6 K/uL (ref 1.7–7.7)
Neutrophils Relative %: 60 % (ref 43–77)
Platelets: 240 K/uL (ref 150–400)
RBC: 4.12 MIL/uL (ref 3.87–5.11)
RDW: 13.5 % (ref 11.5–15.5)
WBC: 4.3 K/uL (ref 4.0–10.5)

## 2012-06-11 LAB — LIPID PANEL
Cholesterol: 282 mg/dL — ABNORMAL HIGH (ref 0–200)
HDL: 68 mg/dL
LDL Cholesterol: 164 mg/dL — ABNORMAL HIGH (ref 0–99)
Total CHOL/HDL Ratio: 4.1 ratio
Triglycerides: 248 mg/dL — ABNORMAL HIGH
VLDL: 50 mg/dL — ABNORMAL HIGH (ref 0–40)

## 2012-06-11 LAB — CK: Total CK: 93 U/L (ref 7–177)

## 2012-06-11 LAB — HEMOGLOBIN A1C
Hgb A1c MFr Bld: 6.8 % — ABNORMAL HIGH
Mean Plasma Glucose: 148 mg/dL — ABNORMAL HIGH

## 2012-06-11 MED ORDER — ESTROGENS CONJUGATED 0.625 MG PO TABS: ORAL_TABLET | ORAL | Status: DC

## 2012-06-11 MED ORDER — METFORMIN HCL 1000 MG PO TABS: 1000.0000 mg | ORAL_TABLET | Freq: Two times a day (BID) | ORAL | Status: DC

## 2012-06-11 MED ORDER — FLUTICASONE PROPIONATE 50 MCG/ACT NA SUSP: 2.0000 | Freq: Every day | NASAL | Status: DC

## 2012-06-11 NOTE — Progress Notes (Signed)
[address]   [place], [place]  [address]   [phone]  Subjective:    Patient ID: Dorothy [name], female    DOB: 12-06-1955, 56 y.o.   MRN: [id number]  HPIThis 56 y.o. female presents to establish care and for three month follow-up:  1.  DMII: four month follow-up.  Sugars running 122-238.  Compliance with medication; good tolerance to medication; good symptom control.  Denies polydipsia or polyuria.    2. Hyperlipidemia:  Four month follow-up; no changes to management made at last visit.  Reports good tolerance to medication, good compliance with medication; good symptom control.  Denies CP/palp/SOB/leg swelling; denies HA/vision changes/focal weakness/paresthesias.  3.  Depression:  Mom passed four weeks ago; mother died at age 17; died of respiratory failure/pneumonia/COPD/secondary adenocarcinoma lung?  Called mother every day after work; now having a hard time after work.  Brother lived a few hours away.  Son moved to [facility] one week before mother got sick.  Stayed with mother in hospice for four days.  Son in prison; called grandmother.     4. Neck pain:  Unchanged.   Not interested in referral to ortho at this time.  No worsening.    5.  Diarrhea: onset two months.  Has worsened since death of mother.  Stress? Pure water or sludge; after every meals. Chronic diarrhea if does not take medication; Dr. Juanda Chance prescribed Lomotil one bid.  Usually takes one pill three times per week.  Now taking Lomotil bid for two weeks and still having loose stools.  Does not feel stressed.  Watery diarrhea.  Non-bloody stools. No recent antibiotics.  No abdominal pain or cramping.  No nausea or vomiting; no fever.  No malaise or fatigue.  Last colonoscopy 2011 with [facility].  Prep is miserable.  No melena.    6. Hepatitis B#2:; due.  7. Flu vaccine: agreeable.  8. L bunion hurting:  Needs evaluation eventually.  9.  Gasping for air at night  10:  Allergic rhinitis: worsening.  Using Astelin  daily.    Review of Systems  Constitutional: Negative for fever, chills, diaphoresis and fatigue.  HENT: Positive for congestion, rhinorrhea, sneezing and postnasal drip. Negative for ear pain.   Respiratory: Positive for apnea. Negative for shortness of breath and wheezing.   Cardiovascular: Negative for chest pain, palpitations and leg swelling.  Gastrointestinal: Positive for diarrhea. Negative for nausea, vomiting, abdominal pain, constipation, blood in stool, abdominal distention, anal bleeding and rectal pain.  Musculoskeletal: Positive for joint swelling and arthralgias.  Neurological: Negative for dizziness, syncope, facial asymmetry, speech difficulty, weakness, light-headedness, numbness and headaches.  Psychiatric/Behavioral: Positive for dysphoric mood. The patient is not nervous/anxious.         Past Medical History  Diagnosis Date  . Fatty liver   . GERD (gastroesophageal reflux disease)   . Gastritis   . Hyperlipidemia   . Depression   . Anxiety   . Choledocholithiasis   . Hiatal hernia   . Hx of adenomatous colonic polyps   . Diabetes mellitus   . Fibromyalgia   . Migraine, unspecified, without mention of intractable migraine without mention of status migrainosus   . Osteoarthrosis, unspecified whether generalized or localized, unspecified site   . Peptic ulcer, unspecified site, unspecified as acute or chronic, without mention of hemorrhage, perforation, or obstruction   . Otosclerosis, unspecified   . Other B-complex deficiencies   . Chest pain, unspecified   . Hematuria, unspecified   . Unspecified disorder of kidney  and ureter   . Mononeuritis of unspecified site   . Osteoporosis, unspecified   . Meniere's disease, unspecified   . Benign paroxysmal positional vertigo   . Symptomatic menopausal or female climacteric states   . Allergic rhinitis, cause unspecified     Past Surgical History  Procedure Date  . Cholecystectomy   . Total abdominal  hysterectomy     ovaries intact  . Tubal ligation   . Shoulder surgery     left  . Breast enhancement surgery   . Foot surgery     right  . Mouth surgery   . Bile duct exploration     gallstone removed    Prior to Admission medications   Medication Sig Start Date End Date Taking? Authorizing Provider  aspirin EC 81 MG tablet Take 81 mg by mouth daily.   Yes Historical Provider, MD  azelastine (ASTELIN) 137 MCG/SPRAY nasal spray Place 1 spray into the nose as needed. Use in each nostril as directed   Yes Historical Provider, MD  Calcium Carb-Cholecalciferol (CALCIUM 500 +D) 500-400 MG-UNIT TABS Take by mouth daily.   Yes Historical Provider, MD  Cholecalciferol (VITAMIN D3) 2000 UNITS TABS Take by mouth daily.   Yes Historical Provider, MD  esomeprazole (NEXIUM) 40 MG packet Take 40 mg by mouth daily before breakfast.   Yes Historical Provider, MD  estrogens, conjugated, (PREMARIN) 0.625 MG tablet One p o q d 06/11/12  Yes Ethelda Chick, MD  glucose blood test strip ACCUCHECK ACTIVE TEST STRIPS ONLY PLEASE   Yes Historical Provider, MD  Lancets MISC by Does not apply route. accu-check   Yes Historical Provider, MD  metFORMIN (GLUCOPHAGE) 1000 MG tablet Take 1 tablet (1,000 mg total) by mouth 2 (two) times daily with a meal. 06/11/12  Yes Ethelda Chick, MD  omega-3 acid ethyl esters (LOVAZA) 1 G capsule Take 2 g by mouth 2 (two) times daily.   Yes Historical Provider, MD  pregabalin (LYRICA) 50 MG capsule Take 50 mg by mouth 2 (two) times daily.   Yes Historical Provider, MD  simvastatin (ZOCOR) 40 MG tablet Take 40 mg by mouth every evening.   Yes Historical Provider, MD  triamterene-hydrochlorothiazide (DYAZIDE) 37.5-25 MG per capsule Take 2 capsules by mouth every morning.   Yes Historical Provider, MD  venlafaxine XR (EFFEXOR-XR) 75 MG 24 hr capsule Take 75 mg by mouth daily.   Yes Historical Provider, MD  dicyclomine (BENTYL) 20 MG tablet Take 1 tablet (20 mg total) by mouth 2 (two)  times daily. 08/07/12   Hart Carwin, MD  loperamide (IMODIUM) 2 MG capsule Take 1 capsule (2 mg total) by mouth 2 (two) times daily as needed for diarrhea or loose stools. 08/07/12   Hart Carwin, MD  ranitidine (ZANTAC) 300 MG capsule Take 1 capsule (300 mg total) by mouth every evening. 08/07/12   Hart Carwin, MD    Allergies  Allergen Reactions  . Codeine   . Nitrofurantoin   . Pentazocine Lactate   . Phenazopyridine   . Sulfa Drugs Cross Reactors   . Sulfamethoxazole W-Trimethoprim   . Talwin (Pentazocine) Nausea And Vomiting and Rash    Muscle cramps and vision changes    History   Social History  . Marital Status: Married    Spouse Name: N/A    Number of Children: 3  . 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 Smoking status: Never Smoker   . Smokeless tobacco: Never Used  . Alcohol Use: Yes     Comment: occasional 1 x month  . Drug Use: No  . Sexually Active: Not on file   Other Topics Concern  . Not on file   Social History Narrative   Always uses seat belts. Smoke alarm and carbon monoxide detector in the home.No caffeine use. No unsecured guns in the home. Married x 16 years;Happily,no abuse. 2nd marriage. Exercise: Inactive. Lives with spouse and two sons.    Family History  Problem Relation Age of Onset  . Colon cancer Cousin     died age 76  . Prostate cancer Father   . Lung disease Father   . Diabetes Father   . Heart disease Father   . COPD Father   . Breast cancer Maternal Aunt   . Diabetes Mother   . Hypertension Mother   . Hyperlipidemia Mother   . COPD Mother   . Diabetes Brother   . Hypertension Brother     Objective:   Physical Exam  Nursing note and vitals reviewed. Constitutional: She appears well-developed and well-nourished. No distress.  HENT:  Head: Normocephalic and atraumatic.  Right Ear: External ear normal.  Left Ear: External  ear normal.  Nose: Nose normal.  Mouth/Throat: Oropharynx is clear and moist.  Eyes: Conjunctivae normal are normal. Pupils are equal, round, and reactive to light.  Neck: Normal range of motion. Neck supple. No JVD present. No thyromegaly present.  Cardiovascular: Normal rate, regular rhythm, normal heart sounds and intact distal pulses.   Pulmonary/Chest: Effort normal and breath sounds normal.  Abdominal: Soft. Bowel sounds are normal. She exhibits no distension and no mass. There is no tenderness. There is no rebound and no guarding.  Musculoskeletal:       Right shoulder: She exhibits normal range of motion, no tenderness and no bony tenderness.       Left shoulder: She exhibits normal range of motion, no tenderness and no bony tenderness.       Cervical back: She exhibits decreased range of motion and tenderness. She exhibits no bony tenderness, no pain and no spasm.  Lymphadenopathy:    She has no cervical adenopathy.  Skin: She is not diaphoretic.  Psychiatric: She has a normal mood and affect. Her behavior is normal. Judgment and thought content normal.    HEPATITIS B#2 ADMINISTERED.  INFLUENZA VACCINE ADMINISTERED.     Assessment & Plan:   1. Need for hepatitis B vaccination  Hepatitis B vaccine adult IM  2. Need for influenza vaccination  Flu vaccine greater than or equal to 3yo preservative free IM  3. Type II or unspecified type diabetes mellitus without mention of complication, not stated as uncontrolled  CBC with Differential, CK, Hemoglobin A1c, metFORMIN (GLUCOPHAGE) 1000 MG tablet  4. Pure hypercholesterolemia  Comprehensive metabolic panel, Lipid panel  5. Menopause  estrogens, conjugated, (PREMARIN) 0.625 MG tablet  6. Allergic rhinitis  DISCONTINUED: fluticasone (FLONASE) 50 MCG/ACT nasal spray  7. Diarrhea  Clostridium difficile EIA, Ova and parasite examination, Stool culture     1.  DMII:  Controlled; no change in medications; obtain labs.  S/p Hepatitis B#2,  s/p influenza vaccine. 2. Hypercholesterolemia: controlled; no change in management; obtain labs. 3.  Menopause: stable refill of medication provided. 4.  Allergic rhinitis:  Worsening; continue Astelin nasal spray daily; start Flonase daily. 5. Diarrhea:  New.  Obtain stool cultures.  If  persists, follow-up with GI/[facility]. Colonoscopy UTD. 6.  S/p Hepatitis B#2; RTC three months for Hepatitis B#3.   7.  S/p influenza vaccine.  Meds ordered this encounter  Medications  . estrogens, conjugated, (PREMARIN) 0.625 MG tablet    Sig: One p o q d    Dispense:  30 tablet    Refill:  11  . DISCONTD: fluticasone (FLONASE) 50 MCG/ACT nasal spray    Sig: Place 2 sprays into the nose daily.    Dispense:  16 g    Refill:  6  . metFORMIN (GLUCOPHAGE) 1000 MG tablet    Sig: Take 1 tablet (1,000 mg total) by mouth 2 (two) times daily with a meal.    Dispense:  60 tablet    Refill:  11

## 2012-06-11 NOTE — Patient Instructions (Addendum)
1. Need for hepatitis B vaccination  Hepatitis B vaccine adult IM  2. Need for influenza vaccination  Flu vaccine greater than or equal to 55yo preservative free IM  3. Type II or unspecified type diabetes mellitus without mention of complication, not stated as uncontrolled  CBC with Differential, CK, Hemoglobin A1c, metFORMIN (GLUCOPHAGE) 1000 MG tablet  4. Pure hypercholesterolemia  Comprehensive metabolic panel, Lipid panel  5. Menopause  estrogens, conjugated, (PREMARIN) 0.625 MG tablet  6. Allergic rhinitis  fluticasone (FLONASE) 50 MCG/ACT nasal spray  7. Diarrhea  Stool culture, Clostridium difficile culture-fecal

## 2012-06-20 ENCOUNTER — Other Ambulatory Visit: Payer: Self-pay | Admitting: Internal Medicine

## 2012-06-26 ENCOUNTER — Other Ambulatory Visit: Payer: Self-pay | Admitting: Family Medicine

## 2012-06-26 ENCOUNTER — Ambulatory Visit
Admission: RE | Admit: 2012-06-26 | Discharge: 2012-06-26 | Disposition: A | Discharge status: Home / Self Care | Payer: 59 | Source: Ambulatory Visit | Attending: Family Medicine | Admitting: Family Medicine

## 2012-06-26 DIAGNOSIS — Z9882 Breast implant status: Secondary | ICD-10-CM

## 2012-06-26 DIAGNOSIS — Z1231 Encounter for screening mammogram for malignant neoplasm of breast: Secondary | ICD-10-CM

## 2012-07-22 ENCOUNTER — Other Ambulatory Visit: Payer: Self-pay | Admitting: Internal Medicine

## 2012-07-22 MED ORDER — LOPERAMIDE HCL 2 MG PO CAPS: 2.0000 mg | ORAL_CAPSULE | Freq: Two times a day (BID) | ORAL | Status: DC | PRN

## 2012-07-22 NOTE — Telephone Encounter (Signed)
rx sent

## 2012-07-29 ENCOUNTER — Ambulatory Visit: Payer: 59 | Admitting: Internal Medicine

## 2012-08-07 ENCOUNTER — Encounter: Payer: Self-pay | Admitting: Internal Medicine

## 2012-08-07 ENCOUNTER — Ambulatory Visit (INDEPENDENT_AMBULATORY_CARE_PROVIDER_SITE_OTHER): Payer: 59 | Admitting: Internal Medicine

## 2012-08-07 VITALS — BP 108/70 | HR 84 | Ht 64.5 in | Wt 173.4 lb

## 2012-08-07 DIAGNOSIS — R197 Diarrhea, unspecified: Secondary | ICD-10-CM

## 2012-08-07 DIAGNOSIS — K219 Gastro-esophageal reflux disease without esophagitis: Secondary | ICD-10-CM

## 2012-08-07 MED ORDER — LOPERAMIDE HCL 2 MG PO CAPS: 2.0000 mg | ORAL_CAPSULE | Freq: Two times a day (BID) | ORAL | Status: DC | PRN

## 2012-08-07 MED ORDER — DICYCLOMINE HCL 20 MG PO TABS: 20.0000 mg | ORAL_TABLET | Freq: Two times a day (BID) | ORAL | Status: DC

## 2012-08-07 MED ORDER — RANITIDINE HCL 300 MG PO CAPS: 300.0000 mg | ORAL_CAPSULE | Freq: Every evening | ORAL | Status: DC

## 2012-08-07 NOTE — Patient Instructions (Addendum)
We have sent the following medications to your pharmacy for you to pick up at your convenience: Ranitidine every evening (in addition to your Nexium) Imodium Bentyl  CC: Dr Nilda Simmer

## 2012-08-07 NOTE — Progress Notes (Signed)
Dorothy [name] 29-Mar-1956 MRN [id number]  History of Present Illness:  This is a 56 year old white female diabetic with an exacerbation of irritable bowel syndrome/diarrhea. She has a history of loose stoolssince prior cholecystectomy . Her last colonoscopy in November 2011 showed 2 polyps which showed only polypoid mucosa. There were no adenomatous changes. She is now having burning epigastric pain and reflux symptoms while taking Nexium 40 mg every morning. Her mother just passed away in [place] and patient had to stay with her in the hospital and take care of her house and pack everything after she passed away. She was very stressed out and now she is dealing with marriage problems. She feels that lot of her symptoms may be related to stress. Her lst upper endoscopy in 2008 showed gastritis.   Past Medical History  Diagnosis Date  . Fatty liver   . GERD (gastroesophageal reflux disease)   . Gastritis   . Hyperlipidemia   . Depression   . Anxiety   . Choledocholithiasis   . Hiatal hernia   . Hx of adenomatous colonic polyps   . Diabetes mellitus   . Fibromyalgia   . Migraine, unspecified, without mention of intractable migraine without mention of status migrainosus   . Osteoarthrosis, unspecified whether generalized or localized, unspecified site   . Peptic ulcer, unspecified site, unspecified as acute or chronic, without mention of hemorrhage, perforation, or obstruction   . Otosclerosis, unspecified   . Other B-complex deficiencies   . Chest pain, unspecified   . Hematuria, unspecified   . Unspecified disorder of kidney and ureter   . Mononeuritis of unspecified site   . Osteoporosis, unspecified   . Meniere's disease, unspecified   . Benign paroxysmal positional vertigo   . Symptomatic menopausal or female climacteric states   . Allergic rhinitis, cause unspecified    Past Surgical History  Procedure Date  . Cholecystectomy   . Total abdominal hysterectomy    ovaries intact  . Tubal ligation   . Shoulder surgery     left  . Breast enhancement surgery   . Foot surgery     right  . Mouth surgery   . Bile duct exploration     gallstone removed    reports that she has never smoked. She has never used smokeless tobacco. She reports that she drinks alcohol. She reports that she does not use illicit drugs. family history includes Breast cancer in her maternal aunt; COPD in her father and mother; Colon cancer in her cousin; Diabetes in her brother, father, and mother; Heart disease in her father; Hyperlipidemia in her mother; Hypertension in her brother and mother; Lung disease in her father; and Prostate cancer in her father. Allergies  Allergen Reactions  . Codeine   . Nitrofurantoin   . Pentazocine Lactate   . Phenazopyridine   . Sulfa Drugs Cross Reactors   . Sulfamethoxazole W-Trimethoprim   . Talwin (Pentazocine) Nausea And Vomiting and Rash    Muscle cramps and vision changes        Review of Systems: Positive for heartburn negative for dysphagia odynophagia positive for diarrhea. No diarrhea at night  The remainder of the 10 point ROS is negative except as outlined in H&P   Physical Exam: General appearance  Well developed, in no distress.,appears depressed Eyes- non icteric. HEENT nontraumatic, normocephalic. Mouth no lesions, tongue papillated, no cheilosis. Neck supple without adenopathy, thyroid not enlarged, no carotid bruits, no JVD. Lungs Clear to auscultation bilaterally. Cor normal S1, normal  S2, regular rhythm, no murmur,  quiet precordium. Abdomen: Soft abdomen with mild tenderness in epigastrium and also diffuse tenderness around her left transverse and right colon. There is no rebound, no distention or bruit. Rectal: Not done. Extremities no pedal edema. Skin no lesions. Neurological alert and oriented x 3. Psychological normal mood and affect.  Assessment and Plan:  Problem #1 Exacerbation of irritable bowel  syndrome/diarrhea secondary to stress associated with her mother's death and marriage problems. She will continue on Nexium 40 mg daily and add ranitidine 300 mg in the afternoon. She will modify her diet which has been rather erratic. We will refill her Imodium twice a day and add Bentyl 20 mg twice a day. If her symptoms don't settle down in the next 6-8 weeks, she will call us back and I will consider an upper endoscopy and upper abdominal ultrasound.   08/07/2012 [name]

## 2012-08-23 NOTE — Progress Notes (Signed)
Reviewed and agree.

## 2012-09-01 ENCOUNTER — Ambulatory Visit (INDEPENDENT_AMBULATORY_CARE_PROVIDER_SITE_OTHER): Payer: 59 | Admitting: Family Medicine

## 2012-09-01 ENCOUNTER — Encounter: Payer: Self-pay | Admitting: Family Medicine

## 2012-09-01 VITALS — BP 132/84 | HR 69 | Temp 98.3°F | Resp 16 | Ht 65.0 in | Wt 174.6 lb

## 2012-09-01 DIAGNOSIS — K219 Gastro-esophageal reflux disease without esophagitis: Secondary | ICD-10-CM

## 2012-09-01 DIAGNOSIS — R110 Nausea: Secondary | ICD-10-CM

## 2012-09-01 DIAGNOSIS — R1013 Epigastric pain: Secondary | ICD-10-CM

## 2012-09-01 LAB — POCT UA - MICROSCOPIC ONLY: Mucus, UA: NEGATIVE

## 2012-09-01 LAB — POCT CBC
Granulocyte percent: 61 %G (ref 37–80)
Hemoglobin: 13.5 g/dL (ref 12.2–16.2)
MCV: 96.6 fL (ref 80–97)
MID (cbc): 0.3 (ref 0–0.9)
MPV: 10 fL (ref 0–99.8)
POC MID %: 5.2 %M (ref 0–12)
Platelet Count, POC: 264 10*3/uL (ref 142–424)
RBC: 4.53 M/uL (ref 4.04–5.48)
WBC: 6.6 10*3/uL (ref 4.6–10.2)

## 2012-09-01 LAB — POCT URINALYSIS DIPSTICK
Ketones, UA: NEGATIVE
Leukocytes, UA: NEGATIVE
Nitrite, UA: NEGATIVE
Protein, UA: NEGATIVE
Urobilinogen, UA: 0.2
pH, UA: 7

## 2012-09-01 MED ORDER — SUCRALFATE 1 GM/10ML PO SUSP: 1.0000 g | Freq: Three times a day (TID) | ORAL | Status: DC

## 2012-09-01 NOTE — Progress Notes (Signed)
[address]   [place], [place]  [address]   [phone]  Subjective:    Patient ID: Dorothy [name], female    DOB: [birth year], 57 y.o.   MRN: [id number]  HPIThis 57 y.o. female presents for evaluation of nausea, gas, bloating, belching, GERD, abdominal pain.  S/p evaluation by Juanda Chance 08/07/12 for diarrhea, GERD; added Ranitidine 300mg  at bedtime; continued Bentyl and Imodium. Recommended contacting office if no improvement in six weeks for EGD and abdominal u/s.  Called [facility] office two days ago; appointment scheduled for 10/08/12.  No changes to medication.  Felt stress related.  Felt IBS exacerbation.  Eating makes worse.  Really watching food intake; avoiding spicy or gassy foods.  Cut out coffee, juices, diet cokes.  Only drinking water; hurts with going down.  Diarrhea is better; almost constipating since starting Ranitidine.  Did not complete stool studies.  Diarrhea improved after last visit and then recurred.  Exact symptoms in past; tested for H. Pylori and treated with improvement.   No fever/chills/sweats.  +nausea; after eating; no vomiting; no diarrhea; +constipation; no bloody stools.  GERD all day. Has L sided pain and radiates into back; also hurts under R rib cage.  Felt like cholecystitis but s/p cholecystectomy.  Eating makes pain worse.  Moderate flatus.  Appetite fine.  All foods make worse; hand cut back to bland diet. Rare Ibuprofen or Aleve.  No Goody Powders ro BC powders.  Two glasses of tea per day.  No alcohol.     Review of Systems  Constitutional: Negative for fever, chills, diaphoresis and fatigue.  Cardiovascular: Positive for chest pain.  Gastrointestinal: Positive for nausea, abdominal pain, constipation and abdominal distention. Negative for vomiting, diarrhea and anal bleeding.  Genitourinary: Negative for dysuria, urgency, hematuria and flank pain.        Past Medical History  Diagnosis Date  . Fatty liver   . GERD (gastroesophageal reflux disease)   .  Gastritis   . Hyperlipidemia   . Depression   . Anxiety   . Choledocholithiasis   . Hiatal hernia   . Hx of adenomatous colonic polyps   . Diabetes mellitus   . Fibromyalgia   . Migraine, unspecified, without mention of intractable migraine without mention of status migrainosus   . Osteoarthrosis, unspecified whether generalized or localized, unspecified site   . Peptic ulcer, unspecified site, unspecified as acute or chronic, without mention of hemorrhage, perforation, or obstruction   . Otosclerosis, unspecified   . Other B-complex deficiencies   . Chest pain, unspecified   . Hematuria, unspecified   . Unspecified disorder of kidney and ureter   . Mononeuritis of unspecified site   . Osteoporosis, unspecified   . Meniere's disease, unspecified   . Benign paroxysmal positional vertigo   . Symptomatic menopausal or female climacteric states   . Allergic rhinitis, cause unspecified     Past Surgical History  Procedure Date  . Cholecystectomy   . Total abdominal hysterectomy     ovaries intact  . Tubal ligation   . Shoulder surgery     left  . Breast enhancement surgery   . Foot surgery     right  . Mouth surgery   . Bile duct exploration     gallstone removed    Prior to Admission medications   Medication Sig Start Date End Date Taking? Authorizing Provider  aspirin EC 81 MG tablet Take 81 mg by mouth daily.   Yes Historical Provider, MD  azelastine (ASTELIN)  137 MCG/SPRAY nasal spray Place 1 spray into the nose as needed. Use in each nostril as directed   Yes Historical Provider, MD  Calcium Carb-Cholecalciferol (CALCIUM 500 +D) 500-400 MG-UNIT TABS Take by mouth daily.   Yes Historical Provider, MD  Cholecalciferol (VITAMIN D3) 2000 UNITS TABS Take by mouth daily.   Yes Historical Provider, MD  dicyclomine (BENTYL) 20 MG tablet Take 1 tablet (20 mg total) by mouth 2 (two) times daily. 08/07/12  Yes Hart Carwin, MD  esomeprazole (NEXIUM) 40 MG packet Take 40 mg by  mouth daily before breakfast.   Yes Historical Provider, MD  estrogens, conjugated, (PREMARIN) 0.625 MG tablet One p o q d 06/11/12  Yes Ethelda Chick, MD  glucose blood test strip ACCUCHECK ACTIVE TEST STRIPS ONLY PLEASE   Yes Historical Provider, MD  Lancets MISC by Does not apply route. accu-check   Yes Historical Provider, MD  loperamide (IMODIUM) 2 MG capsule Take 1 capsule (2 mg total) by mouth 2 (two) times daily as needed for diarrhea or loose stools. 08/07/12  Yes Hart Carwin, MD  metFORMIN (GLUCOPHAGE) 1000 MG tablet Take 1 tablet (1,000 mg total) by mouth 2 (two) times daily with a meal. 06/11/12  Yes Ethelda Chick, MD  omega-3 acid ethyl esters (LOVAZA) 1 G capsule Take 2 g by mouth 2 (two) times daily.   Yes Historical Provider, MD  pregabalin (LYRICA) 50 MG capsule Take 50 mg by mouth 2 (two) times daily.   Yes Historical Provider, MD  ranitidine (ZANTAC) 300 MG capsule Take 1 capsule (300 mg total) by mouth every evening. 08/07/12  Yes Hart Carwin, MD  simvastatin (ZOCOR) 40 MG tablet Take 40 mg by mouth every evening.   Yes Historical Provider, MD  triamterene-hydrochlorothiazide (DYAZIDE) 37.5-25 MG per capsule Take 2 capsules by mouth every morning.   Yes Historical Provider, MD  venlafaxine XR (EFFEXOR-XR) 75 MG 24 hr capsule Take 75 mg by mouth daily.   Yes Historical Provider, MD  sucralfate (CARAFATE) 1 GM/10ML suspension Take 10 mLs (1 g total) by mouth 3 (three) times daily before meals. 09/01/12   Ethelda Chick, MD    Allergies  Allergen Reactions  . Codeine   . Nitrofurantoin   . Pentazocine Lactate   . Phenazopyridine   . Sulfa Drugs Cross Reactors   . Sulfamethoxazole W-Trimethoprim   . Talwin (Pentazocine) Nausea And Vomiting and Rash    Muscle cramps and vision changes    History   Social History  . Marital Status: Married    Spouse Name: N/A    Number of Children: 3  . 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 Smoking status: Never Smoker   . Smokeless tobacco: Never Used  . Alcohol Use: Yes     Comment: occasional 1 x month  . Drug Use: No  . Sexually Active: Not on file   Other Topics Concern  . Not on file   Social History Narrative   Always uses seat belts. Smoke alarm and carbon monoxide detector in the home.No caffeine use. No unsecured guns in the home. Married x 16 years;Happily,no abuse. 2nd marriage. Exercise: Inactive. Lives with spouse and two sons.    Family History  Problem Relation Age of Onset  . Colon cancer Cousin     died age 67  . Prostate cancer Father   .  Lung disease Father   . Diabetes Father   . Heart disease Father   . COPD Father   . Breast cancer Maternal Aunt   . Diabetes Mother   . Hypertension Mother   . Hyperlipidemia Mother   . COPD Mother   . Diabetes Brother   . Hypertension Brother     Objective:   Physical Exam  Nursing note and vitals reviewed. Constitutional: She is oriented to person, place, and time. She appears well-developed and well-nourished. No distress.  Cardiovascular: Normal rate, regular rhythm and normal heart sounds.   No murmur heard. Pulmonary/Chest: Effort normal and breath sounds normal. She has no wheezes. She has no rales.  Abdominal: Soft. Bowel sounds are normal. She exhibits no distension and no mass. There is no hepatosplenomegaly. There is tenderness in the right upper quadrant, epigastric area and left upper quadrant. There is guarding. There is no rigidity, no rebound, no CVA tenderness and negative Murphy's sign. No hernia.  Neurological: She is alert and oriented to person, place, and time.  Skin: Skin is warm and dry. She is not diaphoretic.  Psychiatric: She has a normal mood and affect. Her behavior is normal. Judgment and thought content normal.    Results for orders placed in visit on 09/01/12  POCT CBC      Component Value Range   WBC 6.6   4.6 - 10.2 K/uL   Lymph, poc 2.2  0.6 - 3.4   POC LYMPH PERCENT 33.8  10 - 50 %L   MID (cbc) 0.3  0 - 0.9   POC MID % 5.2  0 - 12 %M   POC Granulocyte 4.0  2 - 6.9   Granulocyte percent 61.0  37 - 80 %G   RBC 4.53  4.04 - 5.48 M/uL   Hemoglobin 13.5  12.2 - 16.2 g/dL   HCT, POC 65.7  84.6 - 47.9 %   MCV 96.6  80 - 97 fL   MCH, POC 29.8  27 - 31.2 pg   MCHC 30.8 (*) 31.8 - 35.4 g/dL   RDW, POC 96.2     Platelet Count, POC 264  142 - 424 K/uL   MPV 10.0  0 - 99.8 fL  POCT UA - MICROSCOPIC ONLY      Component Value Range   WBC, Ur, HPF, POC neg     RBC, urine, microscopic 0-1     Bacteria, U Microscopic trace     Mucus, UA neg     Epithelial cells, urine per micros 1-2     Crystals, Ur, HPF, POC neg     Casts, Ur, LPF, POC neg     Yeast, UA neg    POCT URINALYSIS DIPSTICK      Component Value Range   Color, UA yellow     Clarity, UA clear     Glucose, UA neg     Bilirubin, UA neg     Ketones, UA neg     Spec Grav, UA 1.015     Blood, UA neg     pH, UA 7.0     Protein, UA neg     Urobilinogen, UA 0.2     Nitrite, UA neg     Leukocytes, UA Negative         Assessment & Plan:   1. Abdominal pain, acute, epigastric  Comprehensive metabolic panel, US Abdomen Complete  2. GERD (gastroesophageal reflux disease)    3. Nausea  POCT CBC, POCT UA -  Microscopic Only, Comprehensive metabolic panel, Lipase, Amylase, US Abdomen Complete    1.  Abdominal Pain:  New to this provider.  Associated with worsening GERD symptoms, bloating, nausea especially post-prandially.  Appointment in one month for GI follow-up. Obtain labs. Refer for abdominal u/s.  Rx for Carafate to use before each meal. Continue Nexium and Ranitidine at current doses.  Ddx includes PUD, gastritis, GERD, biliary process, pancreatitis. 2.  GERD: uncontrolled; continue Nexium and Ranitidine.  Rx for Carafate provided; continue with dietary modification. 3.  Nausea: New.  Associated with above symptoms.  Obtain labs.     Meds ordered this encounter  Medications  . sucralfate (CARAFATE) 1 GM/10ML suspension    Sig: Take 10 mLs (1 g total) by mouth 3 (three) times daily before meals.    Dispense:  420 mL    Refill:  3

## 2012-09-01 NOTE — Patient Instructions (Addendum)
1. Abdominal pain, acute, epigastric  Comprehensive metabolic panel, US Abdomen Complete, POCT urinalysis dipstick  2. GERD (gastroesophageal reflux disease)    3. Nausea  POCT CBC, POCT UA - Microscopic Only, Comprehensive metabolic panel, Lipase, Amylase, US Abdomen Complete, POCT urinalysis dipstick

## 2012-09-02 LAB — LIPASE: Lipase: 24 U/L (ref 0–75)

## 2012-09-02 LAB — COMPREHENSIVE METABOLIC PANEL
Albumin: 4.4 g/dL (ref 3.5–5.2)
BUN: 24 mg/dL — ABNORMAL HIGH (ref 6–23)
Calcium: 9.3 mg/dL (ref 8.4–10.5)
Chloride: 100 mEq/L (ref 96–112)
Creat: 1.48 mg/dL — ABNORMAL HIGH (ref 0.50–1.10)
Glucose, Bld: 176 mg/dL — ABNORMAL HIGH (ref 70–99)
Potassium: 3.6 mEq/L (ref 3.5–5.3)

## 2012-09-02 LAB — AMYLASE: Amylase: 39 U/L (ref 0–105)

## 2012-09-02 NOTE — Progress Notes (Signed)
Reviewed and agree.

## 2012-09-04 ENCOUNTER — Ambulatory Visit
Admission: RE | Admit: 2012-09-04 | Discharge: 2012-09-04 | Disposition: A | Discharge status: Home / Self Care | Payer: 59 | Source: Ambulatory Visit | Attending: Family Medicine | Admitting: Family Medicine

## 2012-09-04 DIAGNOSIS — K760 Fatty (change of) liver, not elsewhere classified: Secondary | ICD-10-CM

## 2012-09-04 DIAGNOSIS — R110 Nausea: Secondary | ICD-10-CM

## 2012-09-04 DIAGNOSIS — R1013 Epigastric pain: Secondary | ICD-10-CM

## 2012-09-04 HISTORY — DX: Fatty (change of) liver, not elsewhere classified: K76.0

## 2012-09-05 ENCOUNTER — Encounter: Payer: Self-pay | Admitting: *Deleted

## 2012-09-06 NOTE — Progress Notes (Signed)
Left msg for patient to schedule 2-4 week f/up appt with Dr. Katrinka Blazing.

## 2012-09-11 ENCOUNTER — Telehealth: Payer: Self-pay

## 2012-09-11 NOTE — Telephone Encounter (Signed)
Patient would like someone to call her with results from ultrasound done on the 8th.

## 2012-09-11 NOTE — Telephone Encounter (Signed)
lmom to cb. 

## 2012-09-12 ENCOUNTER — Telehealth: Payer: Self-pay

## 2012-09-12 NOTE — Telephone Encounter (Signed)
Notes Recorded by Watt Climes on 09/11/2012 at 10:46 AM Left message on machine to call back. ------  Notes Recorded by Ethelda Chick, MD on 09/10/2012 at 11:35 AM Call --- 1. Liver with fatty infiltration; recommend weight loss, exercise, aggressive control of diabetes and cholesterol (as we are doing). 2. Common bile duct is normal. 3. Kidneys are normal. 4. Pancreas is largely obscured by gas thus not well visualized. 5. Recommend current plan of care. Recommend follow-up with Dr. Juanda Chance as scheduled; Dr. Juanda Chance will be able to review abdominal ultrasound results in the computer system. 5. How is she feeling? KMS      Called patient left message for call back/ again Amy

## 2012-09-12 NOTE — Telephone Encounter (Signed)
Pt CB to get results of [place]. I gave her results and instr's from Dr Katrinka Blazing (see notes under [place] results). Pt agreed and reported that she is feeling better other than the burning in stomach/esophagus when she eats. Other Sxs have resolved. Pt will f/up w/Dr Juanda Chance and asked if she should cont the carafate. I advised pt to get the RFs that Dr Katrinka Blazing had given her and continue taking it until she sees Dr Juanda Chance, as long as she is having burning Sxs.

## 2012-09-12 NOTE — Progress Notes (Signed)
Appt made for 10/01/12 with Dr. Katrinka Blazing.

## 2012-09-15 NOTE — Telephone Encounter (Signed)
Pt CB to get results of [place]. I gave her results and instr's from Dr Katrinka Blazing (see notes under [place] results). Pt agreed and reported that she is feeling better other than the burning in stomach/esophagus when she eats. Other Sxs have resolved. Pt will f/up w/Dr Juanda Chance and asked if she should cont the carafate. I advised pt to get the RFs that Dr Katrinka Blazing had given her and continue taking it until she sees Dr Juanda Chance, as long as she is having burning Sxs. Above is note from Sheppard Plumber, RN she has been in touch with patient.   FYI patient feeling better, will follow up with Dr Juanda Chance, and continue the Carafate. Chany Woolworth

## 2012-09-22 ENCOUNTER — Telehealth: Payer: Self-pay | Admitting: *Deleted

## 2012-09-22 MED ORDER — SIMVASTATIN 40 MG PO TABS: 40.0000 mg | ORAL_TABLET | Freq: Every evening | ORAL | Status: DC

## 2012-09-22 NOTE — Telephone Encounter (Signed)
Cholesterol med sent in

## 2012-09-26 ENCOUNTER — Other Ambulatory Visit: Payer: Self-pay | Admitting: Family Medicine

## 2012-10-01 ENCOUNTER — Encounter: Payer: Self-pay | Admitting: Family Medicine

## 2012-10-01 ENCOUNTER — Ambulatory Visit (INDEPENDENT_AMBULATORY_CARE_PROVIDER_SITE_OTHER): Payer: 59 | Admitting: Family Medicine

## 2012-10-01 VITALS — BP 118/90 | HR 73 | Temp 98.7°F | Resp 16 | Ht 65.0 in | Wt 169.6 lb

## 2012-10-01 DIAGNOSIS — E119 Type 2 diabetes mellitus without complications: Secondary | ICD-10-CM

## 2012-10-01 DIAGNOSIS — F329 Major depressive disorder, single episode, unspecified: Secondary | ICD-10-CM

## 2012-10-01 DIAGNOSIS — I10 Essential (primary) hypertension: Secondary | ICD-10-CM

## 2012-10-01 DIAGNOSIS — E7800 Pure hypercholesterolemia, unspecified: Secondary | ICD-10-CM

## 2012-10-01 DIAGNOSIS — R1013 Epigastric pain: Secondary | ICD-10-CM

## 2012-10-01 DIAGNOSIS — E785 Hyperlipidemia, unspecified: Secondary | ICD-10-CM

## 2012-10-01 LAB — CBC WITH DIFFERENTIAL/PLATELET
Basophils Absolute: 0 10*3/uL (ref 0.0–0.1)
Basophils Relative: 1 % (ref 0–1)
Eosinophils Absolute: 0.1 10*3/uL (ref 0.0–0.7)
Eosinophils Relative: 2 % (ref 0–5)
HCT: 42.1 % (ref 36.0–46.0)
Hemoglobin: 14.4 g/dL (ref 12.0–15.0)
MCH: 30.2 pg (ref 26.0–34.0)
MCHC: 34.2 g/dL (ref 30.0–36.0)
MCV: 88.3 fL (ref 78.0–100.0)
Monocytes Absolute: 0.5 10*3/uL (ref 0.1–1.0)
Monocytes Relative: 9 % (ref 3–12)
RDW: 13.5 % (ref 11.5–15.5)

## 2012-10-01 LAB — COMPREHENSIVE METABOLIC PANEL
BUN: 25 mg/dL — ABNORMAL HIGH (ref 6–23)
CO2: 29 mEq/L (ref 19–32)
Creat: 1.18 mg/dL — ABNORMAL HIGH (ref 0.50–1.10)
Glucose, Bld: 138 mg/dL — ABNORMAL HIGH (ref 70–99)
Sodium: 139 mEq/L (ref 135–145)
Total Bilirubin: 0.5 mg/dL (ref 0.3–1.2)
Total Protein: 7.1 g/dL (ref 6.0–8.3)

## 2012-10-01 LAB — LIPID PANEL
Cholesterol: 224 mg/dL — ABNORMAL HIGH (ref 0–200)
HDL: 79 mg/dL (ref >39)

## 2012-10-01 NOTE — Assessment & Plan Note (Signed)
Slightly improved with addition of Carafate.  Concerning for PUD; warrants EGD.  Appointment next week with GI.  Associated with RUQ pain, weight loss.

## 2012-10-01 NOTE — Assessment & Plan Note (Signed)
Uncontrolled at last visit; repeat today; obtain labs.

## 2012-10-01 NOTE — Assessment & Plan Note (Signed)
Controlled with Effexor; no changes to management.

## 2012-10-01 NOTE — Patient Instructions (Addendum)
1. Type II or unspecified type diabetes mellitus without mention of complication, not stated as uncontrolled  CBC with Differential, CK, Comprehensive metabolic panel, Hemoglobin A1c, Lipid panel  2. Pure hypercholesterolemia  CBC with Differential, CK, Comprehensive metabolic panel, Hemoglobin A1c, Lipid panel  3. Essential hypertension, benign  CBC with Differential, CK, Comprehensive metabolic panel, Hemoglobin A1c, Lipid panel  4. Abdominal pain, epigastric
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
## 2012-10-01 NOTE — Assessment & Plan Note (Signed)
Controlled; obtain labs; continue current medications; will call in new glucometer with lancets, test strips.

## 2012-10-02 LAB — HEMOGLOBIN A1C
Hgb A1c MFr Bld: 7.3 % — ABNORMAL HIGH (ref <5.7)
Mean Plasma Glucose: 163 mg/dL — ABNORMAL HIGH (ref <117)

## 2012-10-02 NOTE — Progress Notes (Signed)
Reviewed and agree.

## 2012-10-03 ENCOUNTER — Encounter: Payer: Self-pay | Admitting: *Deleted

## 2012-10-03 DIAGNOSIS — M797 Fibromyalgia: Secondary | ICD-10-CM | POA: Insufficient documentation

## 2012-10-06 MED ORDER — POTASSIUM CHLORIDE CRYS ER 20 MEQ PO TBCR: 20.0000 meq | EXTENDED_RELEASE_TABLET | Freq: Every day | ORAL | Status: DC

## 2012-10-06 NOTE — Addendum Note (Signed)
Addended by: Johnnette Litter on: 10/06/2012 08:33 AM   Modules accepted: Orders

## 2012-10-08 ENCOUNTER — Ambulatory Visit (INDEPENDENT_AMBULATORY_CARE_PROVIDER_SITE_OTHER): Payer: 59 | Admitting: Internal Medicine

## 2012-10-08 ENCOUNTER — Encounter: Payer: Self-pay | Admitting: Internal Medicine

## 2012-10-08 VITALS — BP 102/74 | HR 80 | Ht 64.5 in | Wt 172.5 lb

## 2012-10-08 DIAGNOSIS — R1013 Epigastric pain: Secondary | ICD-10-CM

## 2012-10-08 NOTE — Patient Instructions (Addendum)
You have been scheduled for an endoscopy with propofol. Please follow written instructions given to you at your visit today. If you use inhalers (even only as needed) or a CPAP machine, please bring them with you on the day of your procedure.  Please hold your diabetic medications the morning of your endoscopy.  CC: Dr Nilda Simmer

## 2012-10-08 NOTE — Progress Notes (Signed)
Dorothy [name] 31-Mar-1956 MRN [id number]   History of Present Illness:  This is a 57 year old white female with epigastric pain. Her last appointment was 6 weeks ago. She was put on Nexium 40 mg in the morning and ranitidine 300 mg at bedtime. She is about 30% improved. She saw Dr. Katrinka [name] 2 weeks ago for continued abdominal pain and was started on Carafate 1 g 3 times a day. She is still having pain in the epigastrium, mostly during the day and after meals. She has not lost any weight. She denies nausea or vomiting. She has a history of H. pylori gastritis when she lived in [place] about 14 years ago. She was treated for it. She had a prior cholecystectomy. A recent upper abdominal ultrasound showed a 7.5 mm common bile duct and fatty liver. Her colonoscopy in November 2011 showed 2 polyps, both of them consisted of polypoid mucosa. A prior colonoscopy in 2006 showed hyperplastic polyps. Patient's most recent upper endoscopy in December 2008 showed gastritis. Since the last visit, her diarrhea has subsided on Bentyl 20 mg twice a day. Her stress associated with her mother has decreased. She has been able to work.   Past Medical History  Diagnosis Date  . Fatty liver   . GERD (gastroesophageal reflux disease)   . Gastritis   . Hyperlipidemia   . Depression   . Anxiety   . Choledocholithiasis   . Hiatal hernia   . Hx of adenomatous colonic polyps   . Diabetes mellitus   . Fibromyalgia   . Migraine   . Osteoarthritis   . Peptic ulcer, unspecified site, unspecified as acute or chronic, without mention of hemorrhage, perforation, or obstruction   . Otosclerosis, unspecified   . B12 deficiency   . Hematuria, unspecified   . Unspecified disorder of kidney and ureter   . Mononeuritis of unspecified site   . Osteoporosis, unspecified   . Meniere's disease, unspecified   . Benign paroxysmal positional vertigo   . Symptomatic menopausal or female climacteric states   . Allergic rhinitis,  cause unspecified   . Fatty liver 09/04/12   Past Surgical History  Procedure Laterality Date  . Cholecystectomy    . Total abdominal hysterectomy      ovaries intact  . Tubal ligation    . Shoulder surgery      left  . Breast enhancement surgery    . Foot surgery      right  . Mouth surgery    . Bile duct exploration      gallstone removed    reports that she has never smoked. She has never used smokeless tobacco. She reports that  drinks alcohol. She reports that she does not use illicit drugs. family history includes Breast cancer in her maternal aunt; COPD in her father and mother; Colon cancer in her cousin; Diabetes in her brother, father, and mother; Heart disease in her father; Hyperlipidemia in her mother; Hypertension in her brother and mother; Lung disease in her father; and Prostate cancer in her father. Allergies  Allergen Reactions  . Codeine   . Nitrofurantoin   . Pentazocine Lactate   . Phenazopyridine   . Sulfa Drugs Cross Reactors   . Sulfamethoxazole W-Trimethoprim   . Talwin (Pentazocine) Nausea And Vomiting and Rash    Muscle cramps and vision changes        Review of Systems: Denies heartburn or dysphagia  The remainder of the 10 point ROS is negative except as outlined in  H&P   Physical Exam: General appearance  Well developed, in no distress. Eyes- non icteric. HEENT nontraumatic, normocephalic. Mouth no lesions, tongue papillated, no cheilosis. Neck supple without adenopathy, thyroid not enlarged, no carotid bruits, no JVD. Lungs Clear to auscultation bilaterally. Cor normal S1, normal S2, regular rhythm, no murmur,  quiet precordium. Abdomen: Very tender epigastrium and left and right upper quadrant. Also, there is mild tenderness in the left lower quadrant, normal active bowel sounds. Rectal: Not done. Extremities no pedal edema. Skin no lesions. Neurological alert and oriented x 3. Psychological normal mood and affect.  Assessment and  Plan:  Problem #1 Persistent epigastric pain. Patient is on maximal medical therapy which includes a proton pump inhibitor, ranitidine and Carafate. She is also on antispasmodics. Her liver function tests appeared normal after her prior cholecystectomy. She has a history of H. pylori gastritis and there is a possibility that she has a recurrent infection. She gives a history of an ulcer about 15 years ago. We will proceed with an upper endoscopy and appropriate biopsies.  Problem #2 IBS with diarrhea. This has been under good control with Bentyl.   10/08/2012 Dorothy [name]

## 2012-10-09 ENCOUNTER — Encounter: Payer: Self-pay | Admitting: Internal Medicine

## 2012-10-09 ENCOUNTER — Ambulatory Visit (AMBULATORY_SURGERY_CENTER): Payer: 59 | Admitting: Internal Medicine

## 2012-10-09 ENCOUNTER — Other Ambulatory Visit: Payer: Self-pay | Admitting: Internal Medicine

## 2012-10-09 VITALS — BP 124/79 | HR 72 | Temp 97.7°F | Resp 19 | Ht 64.0 in | Wt 172.0 lb

## 2012-10-09 DIAGNOSIS — R1013 Epigastric pain: Secondary | ICD-10-CM

## 2012-10-09 DIAGNOSIS — K319 Disease of stomach and duodenum, unspecified: Secondary | ICD-10-CM

## 2012-10-09 DIAGNOSIS — K2960 Other gastritis without bleeding: Secondary | ICD-10-CM

## 2012-10-09 DIAGNOSIS — K219 Gastro-esophageal reflux disease without esophagitis: Secondary | ICD-10-CM

## 2012-10-09 MED ORDER — DICYCLOMINE HCL 20 MG PO TABS: 20.0000 mg | ORAL_TABLET | Freq: Two times a day (BID) | ORAL | Status: DC

## 2012-10-09 MED ORDER — SODIUM CHLORIDE 0.9 % IV SOLN: 500.0000 mL | INTRAVENOUS | Status: DC

## 2012-10-09 MED ORDER — SUCRALFATE 1 GM/10ML PO SUSP: 1.0000 g | Freq: Three times a day (TID) | ORAL | Status: DC

## 2012-10-09 NOTE — Progress Notes (Signed)
Called to room to assist during endoscopic procedure.  Patient ID and intended procedure confirmed with present staff. Received instructions for my participation in the procedure from the performing physician.  

## 2012-10-09 NOTE — Patient Instructions (Addendum)

## 2012-10-09 NOTE — Op Note (Signed)
[facility] [address] [place], [address]   ENDOSCOPY PROCEDURE REPORT  PATIENT: Dorothy, [name]  MR#: [id number] BIRTHDATE: Jan 03, 1956 , 56  yrs. old GENDER: Female ENDOSCOPIST: Hart Carwin, MD REFERRED BY:  Nilda Simmer, M.D. PROCEDURE DATE:  10/09/2012 PROCEDURE:  EGD w/ biopsy ASA CLASS:     Class II INDICATIONS:  Epigastric pain.   epig.  burning refractory to PPI, hx GERD on EGD 1999, 2001, EGD 2008- gastritis.  Hx of H.Pylori, treated, s/p chole,. MEDICATIONS: MAC sedation, administered by CRNA and propofol (Diprivan) 200mg  IV TOPICAL ANESTHETIC: none  DESCRIPTION OF PROCEDURE: After the risks benefits and alternatives of the procedure were thoroughly explained, informed consent was obtained.  The LB GIF-H180 [id number] endoscope was introduced through the mouth and advanced to the second portion of the duodenum. Without limitations.  The instrument was slowly withdrawn as the mucosa was fully examined.        STOMACH: Mild gastritis (inflammation) with hemorrhage was found in the gastric antrum.  A biopsy was performed using cold forceps. Sample obtained for helicobacter pylori testing.  Retroflexed views revealed no abnormalities.     The scope was then withdrawn from the patient and the procedure completed.  COMPLICATIONS: There were no complications. ENDOSCOPIC IMPRESSION: Gastritis (inflammation) with hemorrhage was found in the gastric antrum; biopsy , nothing to account for the abdominal pain  RECOMMENDATIONS: 1.  Await biopsy results 2.  Continue current meds 3. Trial of probiotics 4. follow up prn  REPEAT EXAM: for EGD pending biopsy results.  eSigned:  Hart Carwin, MD 10/09/2012 1:05 PM   CC:

## 2012-10-09 NOTE — Progress Notes (Signed)
Lidocaine-40mg IV prior to Propofol InductionPropofol given over incremental dosages 

## 2012-10-09 NOTE — Progress Notes (Signed)
Patient did not experience any of the following events: a burn prior to discharge; a fall within the facility; wrong site/side/patient/procedure/implant event; or a hospital transfer or hospital admission upon discharge from the facility. (G8907) Patient did not have preoperative order for IV antibiotic SSI prophylaxis. (G8918)  

## 2012-10-15 ENCOUNTER — Encounter: Payer: Self-pay | Admitting: Internal Medicine

## 2012-12-30 ENCOUNTER — Other Ambulatory Visit: Payer: Self-pay | Admitting: Family Medicine

## 2013-01-07 ENCOUNTER — Ambulatory Visit (INDEPENDENT_AMBULATORY_CARE_PROVIDER_SITE_OTHER): Payer: 59 | Admitting: Family Medicine

## 2013-01-07 ENCOUNTER — Encounter: Payer: Self-pay | Admitting: Family Medicine

## 2013-01-07 VITALS — BP 115/82 | HR 76 | Temp 97.8°F | Resp 16 | Ht 65.0 in | Wt 168.0 lb

## 2013-01-07 DIAGNOSIS — E7800 Pure hypercholesterolemia, unspecified: Secondary | ICD-10-CM

## 2013-01-07 DIAGNOSIS — Z0000 Encounter for general adult medical examination without abnormal findings: Secondary | ICD-10-CM

## 2013-01-07 DIAGNOSIS — Z23 Encounter for immunization: Secondary | ICD-10-CM

## 2013-01-07 DIAGNOSIS — Z01419 Encounter for gynecological examination (general) (routine) without abnormal findings: Secondary | ICD-10-CM

## 2013-01-07 DIAGNOSIS — E119 Type 2 diabetes mellitus without complications: Secondary | ICD-10-CM

## 2013-01-07 LAB — CBC WITH DIFFERENTIAL/PLATELET
Basophils Relative: 0 % (ref 0–1)
Eosinophils Absolute: 0.1 10*3/uL (ref 0.0–0.7)
Eosinophils Relative: 1 % (ref 0–5)
Lymphs Abs: 2.2 10*3/uL (ref 0.7–4.0)
MCH: 31 pg (ref 26.0–34.0)
MCHC: 34.4 g/dL (ref 30.0–36.0)
MCV: 90.2 fL (ref 78.0–100.0)
Neutrophils Relative %: 60 % (ref 43–77)
Platelets: 283 10*3/uL (ref 150–400)
RBC: 4.9 MIL/uL (ref 3.87–5.11)
RDW: 13.7 % (ref 11.5–15.5)

## 2013-01-07 LAB — FOLATE: Folate: 15.3 ng/mL

## 2013-01-07 LAB — COMPREHENSIVE METABOLIC PANEL
AST: 16 U/L (ref 0–37)
BUN: 29 mg/dL — ABNORMAL HIGH (ref 6–23)
Calcium: 10.1 mg/dL (ref 8.4–10.5)
Chloride: 96 mEq/L (ref 96–112)
Creat: 1.34 mg/dL — ABNORMAL HIGH (ref 0.50–1.10)
Total Bilirubin: 0.5 mg/dL (ref 0.3–1.2)

## 2013-01-07 LAB — LIPID PANEL
Cholesterol: 212 mg/dL — ABNORMAL HIGH (ref 0–200)
HDL: 67 mg/dL (ref >39)
Triglycerides: 359 mg/dL — ABNORMAL HIGH (ref <150)
VLDL: 72 mg/dL — ABNORMAL HIGH (ref 0–40)

## 2013-01-07 LAB — HEMOGLOBIN A1C: Hgb A1c MFr Bld: 6.7 % — ABNORMAL HIGH (ref <5.7)

## 2013-01-07 LAB — VITAMIN B12: Vitamin B-12: 1337 pg/mL — ABNORMAL HIGH (ref 211–911)

## 2013-01-07 LAB — POCT UA - MICROSCOPIC ONLY

## 2013-01-07 LAB — POCT URINALYSIS DIPSTICK
Bilirubin, UA: NEGATIVE
Glucose, UA: NEGATIVE
Spec Grav, UA: 1.025
pH, UA: 5.5

## 2013-01-07 LAB — VITAMIN D 25 HYDROXY (VIT D DEFICIENCY, FRACTURES): Vit D, 25-Hydroxy: 44 ng/mL (ref 30–89)

## 2013-01-07 LAB — CK: Total CK: 30 U/L (ref 7–177)

## 2013-01-07 LAB — TSH: TSH: 3.067 u[IU]/mL (ref 0.350–4.500)

## 2013-01-07 MED ORDER — ESOMEPRAZOLE MAGNESIUM 40 MG PO CPDR: 40.0000 mg | DELAYED_RELEASE_CAPSULE | Freq: Every day | ORAL | Status: DC

## 2013-01-07 NOTE — Progress Notes (Signed)
[address]   [place], [place]  [address]   [phone]  Subjective:    Patient ID: Dorothy [name], female    DOB: [birth year], 57 y.o.   MRN: [id number]  HPI This 57 y.o. female presents for evaluation of CPE.  Last physical 12/14/11. Pap smear 12/14/2011. Mammogram 06/26/12. Colonoscopy 07/28/2005. Repeat 06/2010. TDAP 08/28/2008. Pneumovax 08/29/2007. Hepatitis B  12/14/2011, 06/11/2012. Flu vaccine 06/11/2012. Eye exam due for one now; 11/2011; called this morning; [facility]. Dental exam Read Chestine Spore; last week.   Review of Systems  Constitutional: Negative for fever, chills, diaphoresis, activity change, appetite change, fatigue and unexpected weight change.  HENT: Positive for ear pain and postnasal drip. Negative for hearing loss, nosebleeds, congestion, sore throat, facial swelling, sneezing, drooling, mouth sores, trouble swallowing, neck pain, neck stiffness, dental problem, voice change, sinus pressure, tinnitus and ear discharge.   Eyes: Negative for photophobia, pain, discharge, redness, itching and visual disturbance.  Respiratory: Negative for apnea, cough, choking, chest tightness, shortness of breath, wheezing and stridor.   Cardiovascular: Negative for chest pain, palpitations and leg swelling.  Gastrointestinal: Positive for nausea and diarrhea. Negative for vomiting, abdominal pain, constipation, blood in stool, abdominal distention, anal bleeding and rectal pain.  Endocrine: Negative for cold intolerance, heat intolerance, polydipsia, polyphagia and polyuria.  Genitourinary: Negative for dysuria, urgency, frequency, hematuria, flank pain, decreased urine volume, vaginal bleeding, vaginal discharge, enuresis, difficulty urinating, genital sores, vaginal pain, pelvic pain and dyspareunia.  Musculoskeletal: Positive for myalgias, back pain and arthralgias. Negative for joint swelling and gait problem.  Skin: Negative for color change, pallor, rash and wound.    Allergic/Immunologic: Negative for environmental allergies, food allergies and immunocompromised state.  Neurological: Positive for dizziness, light-headedness and headaches. Negative for tremors, seizures, syncope, facial asymmetry, speech difficulty, weakness and numbness.  Hematological: Negative for adenopathy. Does not bruise/bleed easily.  Psychiatric/Behavioral: Negative for suicidal ideas, hallucinations, behavioral problems, confusion, sleep disturbance, self-injury, dysphoric mood, decreased concentration and agitation. The patient is not nervous/anxious and is not hyperactive.        Objective:   Physical Exam  Nursing note and vitals reviewed. Constitutional: She is oriented to person, place, and time. She appears well-developed and well-nourished. No distress.  HENT:  Head: Normocephalic and atraumatic.  Right Ear: External ear normal.  Left Ear: External ear normal.  Nose: Nose normal.  Mouth/Throat: Oropharynx is clear and moist.  Eyes: Conjunctivae and EOM are normal. Pupils are equal, round, and reactive to light.  Neck: Normal range of motion. Neck supple. No JVD present. No thyromegaly present.  Cardiovascular: Normal rate, regular rhythm, normal heart sounds and intact distal pulses.  Exam reveals no gallop and no friction rub.   No murmur heard. Pulmonary/Chest: Effort normal and breath sounds normal. She has no wheezes. She has no rales.  Abdominal: Soft. Bowel sounds are normal. She exhibits no distension and no mass. There is no tenderness. There is no rebound and no guarding.  Genitourinary: Vagina normal. No breast swelling, tenderness, discharge or bleeding. There is no rash, tenderness or lesion on the right labia. There is no rash, tenderness or lesion on the left labia. Right adnexum displays no mass, no tenderness and no fullness. Left adnexum displays no mass, no tenderness and no fullness.  Lymphadenopathy:    She has no cervical adenopathy.  Neurological:  She is alert and oriented to person, place, and time. She has normal reflexes. No cranial nerve deficit. She exhibits normal muscle tone. Coordination normal.  Monofilament intact.  Skin: Skin is warm and dry. No rash noted. She is not diaphoretic. No erythema. No pallor.  Psychiatric: She has a normal mood and affect. Her behavior is normal. Judgment and thought content normal.   Hepatitis B#3 administered in office.    Assessment & Plan:  Routine general medical examination at a health care facility - Plan: POCT urinalysis dipstick, Microalbumin, urine, TSH, Vitamin B12, Vitamin D 25 hydroxy, Folate, EKG 12-Lead, POCT UA - Microscopic Only  Type II or unspecified type diabetes mellitus without mention of complication, not stated as uncontrolled - Plan: CBC with Differential, Hemoglobin A1c, Microalbumin, urine  Pure hypercholesterolemia - Plan: CBC with Differential, CK, Comprehensive metabolic panel, Lipid panel  Need for hepatitis B vaccination - Plan: Hepatitis B vaccine adult IM

## 2013-01-07 NOTE — Progress Notes (Signed)
  Subjective:    Patient ID: Dorothy [name], female    DOB: May 16, 1956, 57 y.o.   MRN: [id number]  HPI    Review of Systems  HENT: Positive for ear pain, postnasal drip and sinus pressure.   Gastrointestinal: Positive for nausea and diarrhea.  Musculoskeletal: Positive for myalgias, back pain and arthralgias.  Neurological: Positive for dizziness, light-headedness and headaches.       Objective:   Physical Exam        Assessment & Plan:

## 2013-01-15 ENCOUNTER — Telehealth: Payer: Self-pay

## 2013-01-15 MED ORDER — LANCETS MISC: Status: DC

## 2013-01-15 NOTE — Telephone Encounter (Signed)
Called pt back to give her lab results. Pt reported that she WAS fasting for her lipid panel, so she is concerned also that her triglycerides have increased. Dr Katrinka Blazing, [place]. Do you want to make any changes to her medication?

## 2013-01-15 NOTE — Telephone Encounter (Signed)
Pt is calling back about some results Call back numbers she left is home [phone] and cell [phone]

## 2013-01-24 NOTE — Telephone Encounter (Signed)
I suspect that her triglycerides have increased because she stopped the Lovaza (which is a triglyceride lowering medication).  She could start back the Lovaza or could start Fish Oil if she can tolerate it (Fish Oil 1000mg  two tablets bid).

## 2013-01-26 NOTE — Telephone Encounter (Signed)
Left message with husband for pt to [facility]

## 2013-01-27 NOTE — Telephone Encounter (Signed)
Advised pt of dr Lonn Georgia note

## 2013-01-27 NOTE — Telephone Encounter (Signed)
lmom to cb. 

## 2013-02-25 DIAGNOSIS — Z01419 Encounter for gynecological examination (general) (routine) without abnormal findings: Secondary | ICD-10-CM | POA: Insufficient documentation

## 2013-02-25 DIAGNOSIS — Z0000 Encounter for general adult medical examination without abnormal findings: Secondary | ICD-10-CM | POA: Insufficient documentation

## 2013-02-25 DIAGNOSIS — Z23 Encounter for immunization: Secondary | ICD-10-CM | POA: Insufficient documentation

## 2013-02-25 NOTE — Assessment & Plan Note (Signed)
S/p Hepatitis B#3 in office; series completed.

## 2013-02-25 NOTE — Assessment & Plan Note (Signed)
Controlled; normal monofilament; obtain urine microalbumin.  S/p Hepatitis B series.

## 2013-02-25 NOTE — Assessment & Plan Note (Signed)
Anticipatory guidance --- weight loss, exercise.  Pap smear UTD.  Mammogram and colonoscopy UTD.  Immunizations UTD: s/p Hepatitis #3 in office. Obtain labs.

## 2013-02-25 NOTE — Assessment & Plan Note (Signed)
Completed. Will need to wean HRT further at next CPE in one year.

## 2013-04-07 ENCOUNTER — Other Ambulatory Visit: Payer: Self-pay | Admitting: Internal Medicine

## 2013-04-09 ENCOUNTER — Telehealth: Payer: Self-pay | Admitting: *Deleted

## 2013-04-09 NOTE — Telephone Encounter (Signed)
lmom to see if pt will be using [facility] for diabetic supplies.  Please let Apolonio Schneiders or Britta Mccreedy know.

## 2013-04-09 NOTE — Telephone Encounter (Signed)
error 

## 2013-04-15 ENCOUNTER — Ambulatory Visit (INDEPENDENT_AMBULATORY_CARE_PROVIDER_SITE_OTHER): Payer: 59 | Admitting: Family Medicine

## 2013-04-15 ENCOUNTER — Encounter: Payer: Self-pay | Admitting: Family Medicine

## 2013-04-15 ENCOUNTER — Ambulatory Visit: Payer: 59

## 2013-04-15 VITALS — BP 112/75 | HR 63 | Temp 97.5°F | Resp 16 | Ht 65.0 in | Wt 169.0 lb

## 2013-04-15 DIAGNOSIS — F329 Major depressive disorder, single episode, unspecified: Secondary | ICD-10-CM

## 2013-04-15 DIAGNOSIS — S63501A Unspecified sprain of right wrist, initial encounter: Secondary | ICD-10-CM

## 2013-04-15 DIAGNOSIS — E7800 Pure hypercholesterolemia, unspecified: Secondary | ICD-10-CM

## 2013-04-15 DIAGNOSIS — E119 Type 2 diabetes mellitus without complications: Secondary | ICD-10-CM

## 2013-04-15 DIAGNOSIS — E782 Mixed hyperlipidemia: Secondary | ICD-10-CM

## 2013-04-15 DIAGNOSIS — S63509A Unspecified sprain of unspecified wrist, initial encounter: Secondary | ICD-10-CM

## 2013-04-15 DIAGNOSIS — M79609 Pain in unspecified limb: Secondary | ICD-10-CM

## 2013-04-15 LAB — COMPREHENSIVE METABOLIC PANEL
ALT: 14 U/L (ref 0–35)
CO2: 28 mEq/L (ref 19–32)
Calcium: 9.3 mg/dL (ref 8.4–10.5)
Chloride: 100 mEq/L (ref 96–112)
Creat: 1.35 mg/dL — ABNORMAL HIGH (ref 0.50–1.10)
Glucose, Bld: 132 mg/dL — ABNORMAL HIGH (ref 70–99)
Total Protein: 6.1 g/dL (ref 6.0–8.3)

## 2013-04-15 LAB — CBC WITH DIFFERENTIAL/PLATELET
Eosinophils Relative: 2 % (ref 0–5)
HCT: 39.5 % (ref 36.0–46.0)
Hemoglobin: 13.3 g/dL (ref 12.0–15.0)
Lymphocytes Relative: 28 % (ref 12–46)
Lymphs Abs: 1.5 10*3/uL (ref 0.7–4.0)
MCV: 92.1 fL (ref 78.0–100.0)
Monocytes Absolute: 0.4 10*3/uL (ref 0.1–1.0)
Monocytes Relative: 7 % (ref 3–12)
RBC: 4.29 MIL/uL (ref 3.87–5.11)
WBC: 5.3 10*3/uL (ref 4.0–10.5)

## 2013-04-15 LAB — LIPID PANEL
LDL Cholesterol: 58 mg/dL (ref 0–99)
Triglycerides: 141 mg/dL (ref <150)

## 2013-04-15 LAB — HEMOGLOBIN A1C
Hgb A1c MFr Bld: 6.3 % — ABNORMAL HIGH (ref <5.7)
Mean Plasma Glucose: 134 mg/dL — ABNORMAL HIGH (ref <117)

## 2013-04-15 MED ORDER — VENLAFAXINE HCL ER 75 MG PO CP24: 75.0000 mg | ORAL_CAPSULE | Freq: Every day | ORAL | Status: DC

## 2013-04-15 MED ORDER — ESOMEPRAZOLE MAGNESIUM 40 MG PO CPDR: 40.0000 mg | DELAYED_RELEASE_CAPSULE | Freq: Every day | ORAL | Status: DC

## 2013-04-15 MED ORDER — MELOXICAM 15 MG PO TABS: 15.0000 mg | ORAL_TABLET | Freq: Every day | ORAL | Status: DC

## 2013-04-15 NOTE — Patient Instructions (Addendum)
1  ICE WRIST AND FOOT TWICE DAILY (15-20 MINUTES) FOR ONE WEEK AND THEN DECREASE TO ICING ONCE DAILY FOR ANOTHER WEEK. 2. WEAR WRIST SPLINT DURING THE DAY FOR THE NEXT 2-3 WEEKS.   3.  TAKE WRIST SPLINT OFF EVERY EVENING  AND PERFORM RANGE OF MOTION EXERCISES.

## 2013-04-15 NOTE — Progress Notes (Signed)
[address]   [place], [place]  [address]   [phone]  Subjective:    Patient ID: Dorothy [name], female    DOB: [birth year], 57 y.o.   MRN: [id number]  HPI This 57 y.o. female presents for three month follow-up:  1.  DMII:  Three month follow-up; no changes to management made at last visit.  HgbA1c at goal.  Reports good compliance with medication, good tolerance to medication; good symptom control.  Sugars running 128-170s.    2. Hyperlipidemia: three month follow-up; had stopped Lovaza prior to last visit; triglycerides elevated.  No change in diet since last visit.  Wants to take less medication.  3. Wrist pain R:  Onset three weeks ago.  No injury.  Only thing pt can think of put up green beans; not sure if repetitive motion of stringing beans caused pain.  Onset ulnar aspect of wrist.   No swelling.  No numbness of tingling; pain with movement; no pain with rest.  Bought wrist splint three days ago; wearing wrist split.  No ice.  No medication.  4.  Depression: stable at this time; mother died in past year; also caught husband talking with another women; no physical relationship; now undergoing counseling marital with Librarian, academic.  Coping well; needs refill on Effexor.    5.  Jaw surgery: scheduled for June 20, 2013.  Reconstruct the bottom jaw because jaw recesses and bottom teeth cut gums.  6. R foot pain: hit 5th toe on chair one week ago; now having pain in 3rd toe proximal; history of foot surgery in past.  No n/t or burning. No bruising or swelling.  Must wear supportive shoes to help with pain.  7. Epigastric pain: much improved; really worsened after death of mother and with marital issues.  Continuing to take Nexium and Zantac daily; afraid to stop medication.   Review of Systems  Constitutional: Negative for fever, chills, diaphoresis and fatigue.  Respiratory: Negative for shortness of breath, wheezing and stridor.   Cardiovascular: Negative for chest pain,  palpitations and leg swelling.  Gastrointestinal: Negative for nausea, vomiting, abdominal pain, diarrhea, constipation, blood in stool, abdominal distention, anal bleeding and rectal pain.  Musculoskeletal: Positive for arthralgias and gait problem. Negative for myalgias, back pain and joint swelling.  Skin: Negative for color change, pallor, rash and wound.  Neurological: Negative for dizziness, tremors, seizures, syncope, facial asymmetry, speech difficulty, weakness, light-headedness, numbness and headaches.  Psychiatric/Behavioral: Positive for dysphoric mood. Negative for suicidal ideas, sleep disturbance and self-injury. The patient is not nervous/anxious.       Past Medical History  Diagnosis Date  . Fatty liver   . GERD (gastroesophageal reflux disease)   . Gastritis   . Hyperlipidemia   . Depression   . Anxiety   . Choledocholithiasis   . Hiatal hernia   . Hx of adenomatous colonic polyps   . Diabetes mellitus   . Fibromyalgia   . Migraine   . Osteoarthritis   . Peptic ulcer, unspecified site, unspecified as acute or chronic, without mention of hemorrhage, perforation, or obstruction   . Otosclerosis, unspecified   . B12 deficiency   . Hematuria, unspecified   . Unspecified disorder of kidney and ureter   . Mononeuritis of unspecified site   . Osteoporosis, unspecified   . Meniere's disease, unspecified   . Benign paroxysmal positional vertigo   . Symptomatic menopausal or female climacteric states   . Allergic rhinitis, cause unspecified   . Fatty liver  09/04/12    Past Surgical History  Procedure Laterality Date  . Cholecystectomy    . Total abdominal hysterectomy      ovaries intact  . Tubal ligation    . Shoulder surgery      left  . Breast enhancement surgery    . Foot surgery      right  . Mouth surgery    . Bile duct exploration      gallstone removed    Prior to Admission medications   Medication Sig Start Date End Date Taking? Authorizing  Provider  aspirin EC 81 MG tablet Take 81 mg by mouth daily.   Yes Historical Provider, MD  azelastine (ASTELIN) 137 MCG/SPRAY nasal spray Place 1 spray into the nose as needed. Use in each nostril as directed   Yes Historical Provider, MD  Calcium Carb-Cholecalciferol (CALCIUM 500 +D) 500-400 MG-UNIT TABS Take by mouth daily.   Yes Historical Provider, MD  Cholecalciferol (VITAMIN D3) 2000 UNITS TABS Take by mouth daily.   Yes Historical Provider, MD  esomeprazole (NEXIUM) 40 MG capsule Take 1 capsule (40 mg total) by mouth daily before breakfast. 01/07/13  Yes Ethelda Chick, MD  estrogens, conjugated, (PREMARIN) 0.625 MG tablet One p o q d 06/11/12  Yes Ethelda Chick, MD  fluticasone Aleda Grana) 50 MCG/ACT nasal spray  12/29/12  Yes Historical Provider, MD  glucose blood test strip ACCUCHECK ACTIVE TEST STRIPS ONLY PLEASE   Yes Historical Provider, MD  Lancets MISC Use to test blood sugar daily. One Touch 01/15/13  Yes Eleanore Delia Chimes, PA-C  metFORMIN (GLUCOPHAGE) 1000 MG tablet Take 1 tablet (1,000 mg total) by mouth 2 (two) times daily with a meal. 06/11/12  Yes Ethelda Chick, MD  pregabalin (LYRICA) 50 MG capsule Take 50 mg by mouth 2 (two) times daily.   Yes Historical Provider, MD  ranitidine (ZANTAC) 300 MG capsule Take 1 capsule (300 mg total) by mouth every evening. 08/07/12  Yes Hart Carwin, MD  simvastatin (ZOCOR) 40 MG tablet Take 1 tablet (40 mg total) by mouth every evening. 09/22/12  Yes Morrell Riddle, PA-C  triamterene-hydrochlorothiazide (DYAZIDE) 37.5-25 MG per capsule Take 2 capsules by mouth every morning.   Yes Historical Provider, MD  venlafaxine XR (EFFEXOR-XR) 75 MG 24 hr capsule TAKE 1 CAPSULE BY MOUTH DAILY 09/26/12  Yes Marzella Schlein McClung, PA-C  loperamide (IMODIUM) 2 MG capsule TAKE 1 CAPSULE BY MOUTH 2 TIMES DAILY AS NEEDED FOR DIARRHEA OR LOOSE STOOLS. 04/07/13   Hart Carwin, MD  LORazepam (ATIVAN) 1 MG tablet  12/29/12   Historical Provider, MD  metaxalone (SKELAXIN) 800  MG tablet  11/13/12   Historical Provider, MD    Allergies  Allergen Reactions  . Codeine   . Nitrofurantoin   . Pentazocine Lactate   . Phenazopyridine   . Sulfa Drugs Cross Reactors   . Sulfamethoxazole W-Trimethoprim   . Talwin [Pentazocine] Nausea And Vomiting and Rash    Muscle cramps and vision changes    History   Social History  . Marital Status: Married    Spouse Name: N/A    Number of Children: 3  . 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 Smoking status: Never Smoker   . Smokeless tobacco: Never Used  . Alcohol Use: Yes     Comment: occasional 1 x month  . Drug Use: No  .  Sexual Activity: Yes   Other Topics Concern  . Not on file   Social History Narrative   Always uses seat belts. Smoke alarm and carbon monoxide detector in the home.No caffeine use. No unsecured guns in the home.    Marital status:  Married x 17 years;Happily,no abuse. 2nd marriage.      Children: 3 sons (37, 22, 26); 3 grandchildren; 3 step grandchildren; 1 gg.      Lives: with husband.      Employment:  Working 15-20 hours per week at [employer]; loves job.      Tobacco:       Alcohol:       Drugs:      Exercise: Inactive/none.    Family History  Problem Relation Age of Onset  . Colon cancer Cousin     died age 20  . Prostate cancer Father   . Lung disease Father   . Diabetes Father   . Heart disease Father 76    CHF, AMI multiple/CABG  . COPD Father   . Cancer Father     prostate cancer  . Breast cancer Maternal Aunt   . Diabetes Mother   . Hypertension Mother   . Hyperlipidemia Mother   . COPD Mother   . Cancer Mother     lung cancer  . Diabetes Brother   . Hypertension Brother   . Obesity Brother   . Stroke Maternal Grandmother   . Heart disease Maternal Grandfather   . COPD Paternal Grandmother   . Diabetes Paternal Grandmother   . COPD Paternal Grandfather          Objective:   Physical Exam  Nursing note and vitals reviewed. Constitutional: She is oriented to person, place, and time. She appears well-developed and well-nourished. No distress.  HENT:  Head: Normocephalic and atraumatic.  Right Ear: External ear normal.  Left Ear: External ear normal.  Nose: Nose normal.  Mouth/Throat: Oropharynx is clear and moist.  Eyes: Conjunctivae and EOM are normal. Pupils are equal, round, and reactive to light.  Neck: Normal range of motion. Neck supple. No JVD present. No thyromegaly present.  Cardiovascular: Normal rate, regular rhythm, normal heart sounds and intact distal pulses.  Exam reveals no gallop and no friction rub.   No murmur heard. Pulmonary/Chest: Effort normal and breath sounds normal.  Musculoskeletal:       Right wrist: She exhibits decreased range of motion, tenderness and bony tenderness. She exhibits no swelling.       Right foot: She exhibits tenderness and bony tenderness. She exhibits normal range of motion and no swelling.  R WRIST:  +TTP ULNAR ASPECT OF WRIST; PAIN WITH FLEXION/EXTENSION; MILD PAIN WITH SUPINATION AND PRONATION.  GRIP 5/5. R HAND: NON-TENDER; NO SWELLING. R FOOT:  NO SWELLING, ECCHYMOSES; +TTP INTERDIGIT SPACE OF 2ND AND 3RD DIGITS.    Lymphadenopathy:    She has no cervical adenopathy.  Neurological: She is alert and oriented to person, place, and time.  Skin: Skin is warm and dry. No rash noted. She is not diaphoretic. No erythema.  Psychiatric: She has a normal mood and affect. Her behavior is normal. Judgment and thought content normal.   [facility] reading (PRIMARY) by  Dr. Katrinka Blazing.  R FOOT:  NAD; +screw in place.      Assessment & Plan:  Type II or unspecified type diabetes mellitus without mention of complication, not stated as uncontrolled - Plan: CBC with Differential, CK, Comprehensive metabolic panel, Hemoglobin A1c  Pure hypercholesterolemia - Plan: CBC with Differential, CK, Comprehensive metabolic  panel, Lipid panel  Pain, foot, right - Plan: DG Foot Complete Right, CANCELED: DG Foot Complete Right  DEPRESSION  Sprain of wrist, right, initial encounter   1.  DMII: controlled; obtain labs; no change in medications.   2.  Hypercholesterolemia: uncontrolled with elevated triglycerides since stopping Lovaza; repeat labs today. If remains elevated, will need to restart Lovaza or Fish oil. 3.  Depression: stable despite death of mother in past year and infidelity of husband; counseling provided in the office; coping well; undergoing marital counseling with good results; no change in Effexor at this time. 4.  R wrist sprain:  New.  Overuse injury; recommend continued wrist splint daily for 2-3 weeks; rx for Mobic 15mg  one daily for next 2-4 weeks; ice wrist bid. 5.  Pain R foot:  New.  Onset after trauma to 5th digit; xray negative; supportive shoe; ice foot bid for one week and then PRN.  Rx for Mobic.  Meds ordered this encounter  Medications  . meloxicam (MOBIC) 15 MG tablet    Sig: Take 1 tablet (15 mg total) by mouth daily.    Dispense:  30 tablet    Refill:  0  . venlafaxine XR (EFFEXOR-XR) 75 MG 24 hr capsule    Sig: Take 1 capsule (75 mg total) by mouth daily.    Dispense:  30 capsule    Refill:  11  . esomeprazole (NEXIUM) 40 MG capsule    Sig: Take 1 capsule (40 mg total) by mouth daily before breakfast.    Dispense:  30 capsule    Refill:  11

## 2013-04-16 NOTE — Telephone Encounter (Signed)
She would like to continue using [facility].

## 2013-04-21 ENCOUNTER — Telehealth: Payer: Self-pay | Admitting: *Deleted

## 2013-04-21 NOTE — Telephone Encounter (Signed)
Faxed signed order to [facility] [phone] for diabetic supplies, per Dr Kevin Fenton. Confirmation page received.

## 2013-05-10 ENCOUNTER — Other Ambulatory Visit: Payer: Self-pay | Admitting: Family Medicine

## 2013-05-22 ENCOUNTER — Encounter (HOSPITAL_COMMUNITY)
Admission: RE | Disposition: A | Discharge status: Home / Self Care | Payer: Self-pay | Source: Ambulatory Visit | Attending: Oral Surgery

## 2013-05-22 SURGERY — OSTEOTOMY, MANDIBLE
Anesthesia: General | Laterality: Bilateral

## 2013-06-09 ENCOUNTER — Encounter (HOSPITAL_COMMUNITY): Payer: Self-pay | Admitting: Pharmacy Technician

## 2013-06-09 ENCOUNTER — Other Ambulatory Visit (HOSPITAL_COMMUNITY): Payer: Self-pay | Admitting: Oral Surgery

## 2013-06-09 NOTE — Progress Notes (Signed)
ekg 01-07-13 epic

## 2013-06-09 NOTE — Patient Instructions (Addendum)
20 Dorothy [name]  06/09/2013   Your procedure is scheduled on: 06-19-2013  Report to [facility] at 530 AM.  Call this number if you have problems the morning of surgery [phone]   Remember:   Do not eat food or drink liquids :After Midnight.     Take these medicines the morning of surgery with A SIP OF WATER: lyrica, fluticasone nasal spray                                SEE [facility] PREPARING FOR SURGERY SHEET             You may not have any metal on your body including hair pins and piercings  Do not wear jewelry, make-up.  Do not wear lotions, powders, or perfumes. You may wear deodorant.   Men may shave face and neck.  Do not bring valuables to the hospital. [facility] IS NOT RESPONSIBLE FOR VALUEABLES.  Contacts, dentures or bridgework may not be worn into surgery.  Leave suitcase in the car. After surgery it may be brought to your room.  For patients admitted to the hospital, checkout time is 11:00 AM the day of discharge.   Patients discharged the day of surgery will not be allowed to drive home.  Name and phone number of your driver:  Special Instructions: N/A   Please read over the following fact sheets that you were given:   Call Cain Sieve RN pre op nurse if needed [phone]    FAILURE TO FOLLOW THESE INSTRUCTIONS MAY RESULT IN THE CANCELLATION OF YOUR SURGERY.  PATIENT SIGNATURE___________________________________________  NURSE SIGNATURE_____________________________________________

## 2013-06-10 ENCOUNTER — Encounter (HOSPITAL_COMMUNITY)
Admission: RE | Admit: 2013-06-10 | Discharge: 2013-06-10 | Disposition: A | Discharge status: Home / Self Care | Payer: 59 | Source: Ambulatory Visit | Attending: Oral Surgery | Admitting: Oral Surgery

## 2013-06-10 ENCOUNTER — Encounter (INDEPENDENT_AMBULATORY_CARE_PROVIDER_SITE_OTHER): Payer: Self-pay

## 2013-06-10 ENCOUNTER — Encounter (HOSPITAL_COMMUNITY): Payer: Self-pay

## 2013-06-10 DIAGNOSIS — Z01812 Encounter for preprocedural laboratory examination: Secondary | ICD-10-CM | POA: Insufficient documentation

## 2013-06-10 LAB — CBC
Hemoglobin: 13.6 g/dL (ref 12.0–15.0)
MCH: 30.6 pg (ref 26.0–34.0)
MCHC: 34 g/dL (ref 30.0–36.0)
Platelets: 226 10*3/uL (ref 150–400)
RBC: 4.45 MIL/uL (ref 3.87–5.11)
RDW: 13.4 % (ref 11.5–15.5)
WBC: 5.5 10*3/uL (ref 4.0–10.5)

## 2013-06-10 LAB — BASIC METABOLIC PANEL
CO2: 24 mEq/L (ref 19–32)
Calcium: 10.3 mg/dL (ref 8.4–10.5)
GFR calc non Af Amer: 65 mL/min — ABNORMAL LOW (ref >90)
Potassium: 3.5 mEq/L (ref 3.5–5.1)
Sodium: 136 mEq/L (ref 135–145)

## 2013-06-10 NOTE — Progress Notes (Signed)
bmet results faxed by epic to dr Gwendlyn Deutscher office

## 2013-06-11 NOTE — H&P (Signed)
Dorothy [name] is an 57 y.o. female.   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  She has had multiple implants placed over the last several years and requires a mandibular advancement of the mandible for her to have a functional occlusion.    Past Medical History  Diagnosis Date  . Fatty liver   . GERD (gastroesophageal reflux disease)   . Gastritis   . Hyperlipidemia   . Depression   . Anxiety   . Choledocholithiasis   . Hx of adenomatous colonic polyps   . Diabetes mellitus   . Migraine   . Osteoarthritis   . Peptic ulcer, unspecified site, unspecified as acute or chronic, without mention of hemorrhage, perforation, or obstruction   . Otosclerosis, unspecified   . B12 deficiency   . Hematuria, unspecified   . Unspecified disorder of kidney and ureter   . Osteoporosis, unspecified   . Meniere's disease, unspecified   . Benign paroxysmal positional vertigo   . Symptomatic menopausal or female climacteric states   . Allergic rhinitis, cause unspecified   . Fatty liver 09/04/12  . Hiatal hernia   . Fibromyalgia   . Mononeuritis of unspecified site     Past Surgical History  Procedure Laterality Date  . Cholecystectomy    . Tubal ligation    . Shoulder surgery      left  . Breast enhancement surgery Bilateral   . Foot surgery      right  . Mouth surgery    . Bile duct exploration      gallstone removed  . Total abdominal hysterectomy      ovaries intact    Family History  Problem Relation Age of Onset  . Colon cancer Cousin     died age 39  . Prostate cancer Father   . Lung disease Father   . Diabetes Father   . Heart disease Father 6    CHF, AMI multiple/CABG  . COPD Father   . Cancer Father     prostate cancer  . Breast cancer Maternal Aunt   . Diabetes Mother   . Hypertension Mother   . Hyperlipidemia Mother   . COPD Mother   . Cancer  Mother     lung cancer  . Diabetes Brother   . Hypertension Brother   . Obesity Brother   . Stroke Maternal Grandmother   . Heart disease Maternal Grandfather   . COPD Paternal Grandmother   . Diabetes Paternal Grandmother   . COPD Paternal Grandfather    Social History:  reports that she has never smoked. She has never used smokeless tobacco. She reports that she drinks alcohol. She reports that she does not use illicit drugs.  Allergies:  Allergies  Allergen Reactions  . Adhesive [Tape] Other (See Comments)    Tears skins  . Bactrim [Sulfamethoxazole-Tmp Ds] Other (See Comments)    blisters  . Codeine Itching  . Macrobid [Nitrofurantoin Macrocrystal] Other (See Comments)    blisters  . Nitrofurantoin Nausea And Vomiting and Other (See Comments)    Blisters   . Pentazocine Lactate     Pt not sure  . Phenazopyridine     Pt not sure  . Sulfa Drugs Cross Reactors Itching  . Sulfamethoxazole-Trimethoprim Itching  . Talwin [Pentazocine] Nausea And Vomiting and Rash    Muscle cramps and vision changes  Current medications: Effexor 75 mg, Metformin 1000 mg, Simvastatin 40 mg, Loperamide 2 mg, Nexium 40 mg, Lyrica, 50 mg, Triamterene HCTZ, Premaxin 0.625, Lovaza, ASA, Vit D-3, Calcium.    Results for orders placed during the hospital encounter of 06/10/13 (from the past 48 hour(s))  CBC     Status: None   Collection Time    06/10/13 10:25 AM      Result Value Range   WBC 5.5  4.0 - 10.5 K/uL   RBC 4.45  3.87 - 5.11 MIL/uL   Hemoglobin 13.6  12.0 - 15.0 g/dL   HCT 40.9  81.1 - 91.4 %   MCV 89.9  78.0 - 100.0 fL   MCH 30.6  26.0 - 34.0 pg   MCHC 34.0  30.0 - 36.0 g/dL   RDW 78.2  95.6 - 21.3 %   Platelets 226  150 - 400 K/uL  BASIC METABOLIC PANEL     Status: Abnormal   Collection Time    06/10/13 10:30 AM      Result Value Range   Sodium 136  135 - 145 mEq/L   Potassium 3.5  3.5 - 5.1 mEq/L   Chloride 97  96 - 112 mEq/L   CO2 24  19 - 32 mEq/L   Glucose, Bld 163  (*) 70 - 99 mg/dL   BUN 27 (*) 6 - 23 mg/dL   Creatinine, Ser 0.86  0.50 - 1.10 mg/dL   Calcium 57.8  8.4 - 46.9 mg/dL   GFR calc non Af Amer 65 (*) >90 mL/min   GFR calc Af Amer 75 (*) >90 mL/min   Comment: (NOTE)     The eGFR has been calculated using the CKD EPI equation.     This calculation has not been validated in all clinical situations.     eGFR's persistently <90 mL/min signify possible Chronic Kidney     Disease.    No results found.  ROS: Pertinent items are noted in HPI.  There were no vitals taken for this visit.  Physical Exam: General appearance: alert and cooperative Head: Normocephalic, without obvious abnormality, atraumatic Eyes: conjunctivae/corneas clear. PERRL, EOM's intact. Fundi benign. Ears: normal TM's and external ear canals both ears Nose: Nares normal. Septum midline. Mucosa normal. No drainage or sinus tenderness.  Chest: Clear to Ascultation HEART: No Murmurs Rubs or Gallops Abdomen: Soft NT, ND, Positive bowel sounds Extremities: 2+ pulses x 4  Neurologic: Cranial Nerves Intact  Assessment/Plan Mandibular Hypoplasia  Plan:  1.  Bilateral Sagittal Split Osteotomy Advancement  2. The patient will be admitted for 23 observation  Dorothy [name],Dorothy [name] 06/11/2013, 5:07 PM

## 2013-06-18 ENCOUNTER — Other Ambulatory Visit: Payer: Self-pay

## 2013-06-18 MED ORDER — METFORMIN HCL 1000 MG PO TABS: 1000.0000 mg | ORAL_TABLET | Freq: Two times a day (BID) | ORAL | Status: DC

## 2013-06-18 MED ORDER — ESTROGENS CONJUGATED 0.625 MG PO TABS: 0.6250 mg | ORAL_TABLET | Freq: Every day | ORAL | Status: DC

## 2013-06-19 ENCOUNTER — Ambulatory Visit (HOSPITAL_COMMUNITY): Payer: 59 | Admitting: Anesthesiology

## 2013-06-19 ENCOUNTER — Encounter (HOSPITAL_COMMUNITY): Payer: 59 | Admitting: Anesthesiology

## 2013-06-19 ENCOUNTER — Observation Stay (HOSPITAL_BASED_OUTPATIENT_CLINIC_OR_DEPARTMENT_OTHER)
Admission: RE | Admit: 2013-06-19 | Discharge: 2013-06-20 | Disposition: A | Discharge status: Home / Self Care | Payer: 59 | Source: Ambulatory Visit | Attending: Oral Surgery | Admitting: Oral Surgery

## 2013-06-19 ENCOUNTER — Encounter (HOSPITAL_COMMUNITY): Payer: Self-pay

## 2013-06-19 ENCOUNTER — Encounter (HOSPITAL_COMMUNITY)
Admission: RE | Disposition: A | Discharge status: Home / Self Care | Payer: Self-pay | Source: Ambulatory Visit | Attending: Oral Surgery

## 2013-06-19 DIAGNOSIS — K219 Gastro-esophageal reflux disease without esophagitis: Secondary | ICD-10-CM | POA: Insufficient documentation

## 2013-06-19 DIAGNOSIS — E785 Hyperlipidemia, unspecified: Secondary | ICD-10-CM | POA: Insufficient documentation

## 2013-06-19 DIAGNOSIS — M2604 Mandibular hypoplasia: Principal | ICD-10-CM | POA: Insufficient documentation

## 2013-06-19 DIAGNOSIS — E119 Type 2 diabetes mellitus without complications: Secondary | ICD-10-CM | POA: Insufficient documentation

## 2013-06-19 DIAGNOSIS — I10 Essential (primary) hypertension: Secondary | ICD-10-CM | POA: Insufficient documentation

## 2013-06-19 DIAGNOSIS — M810 Age-related osteoporosis without current pathological fracture: Secondary | ICD-10-CM | POA: Insufficient documentation

## 2013-06-19 HISTORY — PX: MAXILLARY LE FORTE I OSTEOTOMY: SHX2005

## 2013-06-19 LAB — GLUCOSE, CAPILLARY
Glucose-Capillary: 152 mg/dL — ABNORMAL HIGH (ref 70–99)
Glucose-Capillary: 156 mg/dL — ABNORMAL HIGH (ref 70–99)

## 2013-06-19 SURGERY — OSTEOTOMY, LE FORT I, MAXILLA, WITH MANDIBULAR OSTEOTOMY
Anesthesia: General | Site: Mouth | Laterality: Bilateral | Wound class: Clean Contaminated

## 2013-06-19 MED ORDER — VITAMIN D 1000 UNITS PO TABS
2000.0000 [IU] | ORAL_TABLET | Freq: Every day | ORAL | Status: DC
  Administered 2013-06-19 – 2013-06-20 (×2): 2000 [IU] via ORAL
  Filled 2013-06-19 (×2): qty 2

## 2013-06-19 MED ORDER — TRIAMTERENE-HCTZ 37.5-25 MG PO TABS
2.0000 | ORAL_TABLET | Freq: Every day | ORAL | Status: DC
  Administered 2013-06-19 – 2013-06-20 (×2): 2 via ORAL
  Filled 2013-06-19 (×2): qty 2

## 2013-06-19 MED ORDER — PANTOPRAZOLE SODIUM 40 MG PO TBEC
80.0000 mg | DELAYED_RELEASE_TABLET | Freq: Every day | ORAL | Status: DC
  Administered 2013-06-19: 80 mg via ORAL
  Filled 2013-06-19 (×2): qty 2

## 2013-06-19 MED ORDER — CEFAZOLIN SODIUM 1-5 GM-% IV SOLN
1.0000 g | Freq: Three times a day (TID) | INTRAVENOUS | Status: DC
  Administered 2013-06-19 – 2013-06-20 (×3): 1 g via INTRAVENOUS
  Filled 2013-06-19 (×4): qty 50

## 2013-06-19 MED ORDER — PREGABALIN 50 MG PO CAPS
50.0000 mg | ORAL_CAPSULE | Freq: Two times a day (BID) | ORAL | Status: DC
  Administered 2013-06-19 – 2013-06-20 (×2): 50 mg via ORAL
  Filled 2013-06-19 (×2): qty 1

## 2013-06-19 MED ORDER — DEXAMETHASONE SODIUM PHOSPHATE 4 MG/ML IJ SOLN
8.0000 mg | Freq: Three times a day (TID) | INTRAMUSCULAR | Status: DC
  Administered 2013-06-19 – 2013-06-20 (×3): 8 mg via INTRAVENOUS
  Filled 2013-06-19 (×6): qty 2

## 2013-06-19 MED ORDER — OXYMETAZOLINE HCL 0.05 % NA SOLN
NASAL | Status: AC
  Filled 2013-06-19: qty 15

## 2013-06-19 MED ORDER — FLUTICASONE PROPIONATE 50 MCG/ACT NA SUSP
2.0000 | Freq: Every day | NASAL | Status: DC
  Administered 2013-06-20: 2 via NASAL
  Filled 2013-06-19: qty 16

## 2013-06-19 MED ORDER — ESTROGENS CONJUGATED 0.625 MG PO TABS
0.6250 mg | ORAL_TABLET | Freq: Every day | ORAL | Status: DC
  Administered 2013-06-19: 0.625 mg via ORAL
  Filled 2013-06-19 (×2): qty 1

## 2013-06-19 MED ORDER — CEFAZOLIN SODIUM-DEXTROSE 2-3 GM-% IV SOLR
2.0000 g | INTRAVENOUS | Status: AC
  Administered 2013-06-19: 2 g via INTRAVENOUS

## 2013-06-19 MED ORDER — CALCIUM CARBONATE-VITAMIN D 500-200 MG-UNIT PO TABS
1.0000 | ORAL_TABLET | Freq: Every day | ORAL | Status: DC
  Filled 2013-06-19 (×3): qty 1

## 2013-06-19 MED ORDER — BUPIVACAINE-EPINEPHRINE PF 0.5-1:200000 % IJ SOLN
INTRAMUSCULAR | Status: AC
  Filled 2013-06-19: qty 7.2

## 2013-06-19 MED ORDER — FENTANYL CITRATE 0.05 MG/ML IJ SOLN: 25.0000 ug | INTRAMUSCULAR | Status: DC | PRN

## 2013-06-19 MED ORDER — LIDOCAINE HCL (CARDIAC) 20 MG/ML IV SOLN
INTRAVENOUS | Status: DC | PRN
  Administered 2013-06-19: 75 mg via INTRAVENOUS

## 2013-06-19 MED ORDER — OXYCODONE-ACETAMINOPHEN 5-325 MG PO TABS
1.0000 | ORAL_TABLET | ORAL | Status: DC | PRN
  Filled 2013-06-19: qty 2

## 2013-06-19 MED ORDER — HYDROMORPHONE HCL PF 1 MG/ML IJ SOLN
0.5000 mg | INTRAMUSCULAR | Status: DC | PRN
  Administered 2013-06-19 (×2): 0.5 mg via INTRAVENOUS
  Filled 2013-06-19 (×2): qty 1

## 2013-06-19 MED ORDER — VENLAFAXINE HCL ER 75 MG PO CP24
75.0000 mg | ORAL_CAPSULE | Freq: Every evening | ORAL | Status: DC
  Administered 2013-06-19: 75 mg via ORAL
  Filled 2013-06-19 (×2): qty 1

## 2013-06-19 MED ORDER — LACTATED RINGERS IV SOLN: INTRAVENOUS | Status: DC

## 2013-06-19 MED ORDER — PHENYLEPHRINE HCL 10 MG/ML IJ SOLN
INTRAMUSCULAR | Status: DC | PRN
  Administered 2013-06-19: 120 ug via INTRAVENOUS

## 2013-06-19 MED ORDER — LACTATED RINGERS IV SOLN
INTRAVENOUS | Status: DC
  Administered 2013-06-19: 12:00:00 via INTRAVENOUS

## 2013-06-19 MED ORDER — CALCIUM CARB-CHOLECALCIFEROL 500-400 MG-UNIT PO TABS: 1.0000 | ORAL_TABLET | Freq: Every day | ORAL | Status: DC

## 2013-06-19 MED ORDER — PROMETHAZINE HCL 25 MG/ML IJ SOLN: 6.2500 mg | INTRAMUSCULAR | Status: DC | PRN

## 2013-06-19 MED ORDER — ONDANSETRON HCL 4 MG/2ML IJ SOLN: 4.0000 mg | Freq: Four times a day (QID) | INTRAMUSCULAR | Status: DC | PRN

## 2013-06-19 MED ORDER — ISOPROPYL ALCOHOL 70 % SOLN
Status: AC
  Filled 2013-06-19: qty 480

## 2013-06-19 MED ORDER — CHLORHEXIDINE GLUCONATE 0.12 % MT SOLN
15.0000 mL | Freq: Two times a day (BID) | OROMUCOSAL | Status: DC
  Administered 2013-06-19 – 2013-06-20 (×2): 15 mL via OROMUCOSAL
  Filled 2013-06-19 (×3): qty 15

## 2013-06-19 MED ORDER — SODIUM CHLORIDE 0.9 % IV SOLN
INTRAVENOUS | Status: DC
  Administered 2013-06-19: 16:00:00 via INTRAVENOUS

## 2013-06-19 MED ORDER — BUPIVACAINE-EPINEPHRINE PF 0.5-1:200000 % IJ SOLN
INTRAMUSCULAR | Status: DC | PRN
  Administered 2013-06-19: 7.2 mL

## 2013-06-19 MED ORDER — MEPERIDINE HCL 50 MG/ML IJ SOLN: 6.2500 mg | INTRAMUSCULAR | Status: DC | PRN

## 2013-06-19 MED ORDER — OMEGA-3-ACID ETHYL ESTERS 1 G PO CAPS
1.0000 g | ORAL_CAPSULE | Freq: Two times a day (BID) | ORAL | Status: DC
  Filled 2013-06-19 (×4): qty 1

## 2013-06-19 MED ORDER — BACITRACIN-NEOMYCIN-POLYMYXIN 400-5-5000 EX OINT
TOPICAL_OINTMENT | CUTANEOUS | Status: DC | PRN
  Administered 2013-06-19: 1 via TOPICAL

## 2013-06-19 MED ORDER — SIMVASTATIN 40 MG PO TABS
40.0000 mg | ORAL_TABLET | Freq: Every evening | ORAL | Status: DC
  Administered 2013-06-19: 40 mg via ORAL
  Filled 2013-06-19 (×2): qty 1

## 2013-06-19 MED ORDER — DEXAMETHASONE SODIUM PHOSPHATE 10 MG/ML IJ SOLN
INTRAMUSCULAR | Status: DC | PRN
  Administered 2013-06-19: 10 mg via INTRAVENOUS

## 2013-06-19 MED ORDER — HYDROMORPHONE HCL PF 1 MG/ML IJ SOLN
INTRAMUSCULAR | Status: DC | PRN
  Administered 2013-06-19: 0.5 mg via INTRAVENOUS

## 2013-06-19 MED ORDER — LIDOCAINE-EPINEPHRINE 2 %-1:100000 IJ SOLN
INTRAMUSCULAR | Status: AC
  Filled 2013-06-19: qty 6.8

## 2013-06-19 MED ORDER — LOPERAMIDE HCL 2 MG PO CAPS
2.0000 mg | ORAL_CAPSULE | Freq: Every day | ORAL | Status: DC
  Administered 2013-06-19 – 2013-06-20 (×2): 2 mg via ORAL
  Filled 2013-06-19 (×2): qty 1

## 2013-06-19 MED ORDER — TRIAMTERENE-HCTZ 37.5-25 MG PO CAPS
2.0000 | ORAL_CAPSULE | Freq: Every morning | ORAL | Status: DC
  Filled 2013-06-19: qty 2

## 2013-06-19 MED ORDER — NEOSTIGMINE METHYLSULFATE 1 MG/ML IJ SOLN
INTRAMUSCULAR | Status: DC | PRN
  Administered 2013-06-19: 3 mg via INTRAVENOUS

## 2013-06-19 MED ORDER — PROPOFOL 10 MG/ML IV BOLUS
INTRAVENOUS | Status: DC | PRN
  Administered 2013-06-19: 200 mg via INTRAVENOUS
  Administered 2013-06-19: 100 mg via INTRAVENOUS

## 2013-06-19 MED ORDER — ASPIRIN EC 81 MG PO TBEC
81.0000 mg | DELAYED_RELEASE_TABLET | Freq: Every morning | ORAL | Status: DC
  Filled 2013-06-19 (×2): qty 1

## 2013-06-19 MED ORDER — MIDAZOLAM HCL 5 MG/5ML IJ SOLN
INTRAMUSCULAR | Status: DC | PRN
  Administered 2013-06-19: 2 mg via INTRAVENOUS
  Administered 2013-06-19: 1 mg via INTRAVENOUS

## 2013-06-19 MED ORDER — GLYCOPYRROLATE 0.2 MG/ML IJ SOLN
INTRAMUSCULAR | Status: DC | PRN
  Administered 2013-06-19: 0.4 mg via INTRAVENOUS

## 2013-06-19 MED ORDER — CEFAZOLIN SODIUM 1-5 GM-% IV SOLN
INTRAVENOUS | Status: AC
  Filled 2013-06-19: qty 100

## 2013-06-19 MED ORDER — BACITRACIN-NEOMYCIN-POLYMYXIN 400-5-5000 EX OINT
TOPICAL_OINTMENT | CUTANEOUS | Status: AC
  Filled 2013-06-19: qty 1

## 2013-06-19 MED ORDER — METFORMIN HCL 500 MG PO TABS
1000.0000 mg | ORAL_TABLET | Freq: Two times a day (BID) | ORAL | Status: DC
  Administered 2013-06-19 – 2013-06-20 (×2): 1000 mg via ORAL
  Filled 2013-06-19 (×4): qty 2

## 2013-06-19 MED ORDER — LIDOCAINE-EPINEPHRINE 2 %-1:100000 IJ SOLN
INTRAMUSCULAR | Status: DC | PRN
  Administered 2013-06-19: 6.8 mL

## 2013-06-19 MED ORDER — SUFENTANIL CITRATE 50 MCG/ML IV SOLN
INTRAVENOUS | Status: DC | PRN
  Administered 2013-06-19: 10 ug via INTRAVENOUS
  Administered 2013-06-19 (×2): 5 ug via INTRAVENOUS
  Administered 2013-06-19 (×2): 10 ug via INTRAVENOUS
  Administered 2013-06-19: 5 ug via INTRAVENOUS
  Administered 2013-06-19: 10 ug via INTRAVENOUS

## 2013-06-19 MED ORDER — ONDANSETRON HCL 4 MG/2ML IJ SOLN
INTRAMUSCULAR | Status: DC | PRN
  Administered 2013-06-19: 4 mg via INTRAMUSCULAR

## 2013-06-19 MED ORDER — ONDANSETRON HCL 4 MG PO TABS: 4.0000 mg | ORAL_TABLET | Freq: Four times a day (QID) | ORAL | Status: DC | PRN

## 2013-06-19 MED ORDER — ROCURONIUM BROMIDE 100 MG/10ML IV SOLN
INTRAVENOUS | Status: DC | PRN
  Administered 2013-06-19: 60 mg via INTRAVENOUS

## 2013-06-19 MED ORDER — LACTATED RINGERS IV SOLN
INTRAVENOUS | Status: DC | PRN
  Administered 2013-06-19 (×2): via INTRAVENOUS

## 2013-06-19 SURGICAL SUPPLY — 60 items
ATTRACTOMAT 16X20 MAGNETIC DRP (DRAPES) ×2 IMPLANT
BAG SPEC THK2 15X12 ZIP CLS (MISCELLANEOUS)
BAG ZIPLOCK 12X15 (MISCELLANEOUS) IMPLANT
BAR ARCH COIL WRE MTR ACHV (MISCELLANEOUS)
BAR ARCH WIRE ENRICH COIL (MISCELLANEOUS) ×1 IMPLANT
BIT DRILL LINDEMANN MED (BIT) ×1 IMPLANT
BLADE SURG 15 STRL LF DISP TIS (BLADE) ×2 IMPLANT
BLADE SURG 15 STRL SS (BLADE) ×4
BUR CROSS CUT TIS (BURR) ×2 IMPLANT
BUR CRS CUT 2.1 (BURR) ×2 IMPLANT
BUR OVAL CARBIDE 4.0 (BURR) ×2 IMPLANT
BUR SIDE CUT (BURR) ×2 IMPLANT
CANISTER SUCTION 2500CC (MISCELLANEOUS) ×2 IMPLANT
CANNULA VESSEL W/WING WO/VALVE (CANNULA) ×5 IMPLANT
CLEANER TIP ELECTROSURG 2X2 (MISCELLANEOUS) ×2 IMPLANT
CLIP TI WIDE RED SMALL 6 (CLIP) IMPLANT
CLOTH BEACON ORANGE TIMEOUT ST (SAFETY) ×1 IMPLANT
COVER SURGICAL LIGHT HANDLE (MISCELLANEOUS) IMPLANT
DEPRESSOR TONGUE BLADE STERILE (MISCELLANEOUS) IMPLANT
DRAPE TABLE BACK 44X90 PK DISP (DRAPES) ×1 IMPLANT
DRESSING TELFA 8X3 (GAUZE/BANDAGES/DRESSINGS) ×1 IMPLANT
DRILL LINDEMANN MED (BIT) ×2
ELECT COATED BLADE 2.86 ST (ELECTRODE) ×2 IMPLANT
ELECT REM PT RETURN 9FT ADLT (ELECTROSURGICAL) ×2
ELECTRODE REM PT RTRN 9FT ADLT (ELECTROSURGICAL) ×1 IMPLANT
GAUZE SPONGE 4X4 16PLY XRAY LF (GAUZE/BANDAGES/DRESSINGS) ×3 IMPLANT
GLOVE ECLIPSE 8.5 STRL (GLOVE) ×2 IMPLANT
GLOVE SURG SS PI 8.0 STRL IVOR (GLOVE) ×6 IMPLANT
HOLDER FOLEY CATH W/STRAP (MISCELLANEOUS) ×2 IMPLANT
KIT BASIN OR (CUSTOM PROCEDURE TRAY) ×2 IMPLANT
NDL DENTAL 27 LONG (NEEDLE) ×2 IMPLANT
NEEDLE DENTAL 27 LONG (NEEDLE) ×4 IMPLANT
NS IRRIG 1000ML POUR BTL (IV SOLUTION) ×3 IMPLANT
PACK EENT SPLIT (PACKS) ×2 IMPLANT
PACKING VAGINAL (PACKING) ×2 IMPLANT
PAD EYE OVAL STERILE LF (GAUZE/BANDAGES/DRESSINGS) ×4 IMPLANT
PENCIL BUTTON HOLSTER BLD 10FT (ELECTRODE) ×2 IMPLANT
SOL PREP POV-IOD 16OZ 10% (MISCELLANEOUS) ×2 IMPLANT
SOL PREP PROV IODINE SCRUB 4OZ (MISCELLANEOUS) ×2 IMPLANT
SUT CHROMIC 3 0 PS 2 (SUTURE) ×2 IMPLANT
SUT CHROMIC 4 0 P 3 18 (SUTURE) IMPLANT
SUT STEEL 0 (SUTURE)
SUT STEEL 0 18XMFL TIE 12 (SUTURE)
SUT STEEL 2 0 (SUTURE) IMPLANT
SUT VIC AB 2-0 FS1 27 (SUTURE) ×1 IMPLANT
SUT VIC AB 2-0 PS2 27 (SUTURE) ×1 IMPLANT
SUT VIC AB 3-0 PS2 18 (SUTURE) ×4
SUT VIC AB 3-0 PS2 18XBRD (SUTURE) ×2 IMPLANT
SUT VIC AB 4-0 PS2 27 (SUTURE) ×2 IMPLANT
SUT VIC AB 4-0 SH 27 (SUTURE)
SUT VIC AB 4-0 SH 27XBRD (SUTURE) ×2 IMPLANT
SUTURE STEEL 0 18XMFL TIE 12 (SUTURE) ×1 IMPLANT
SYR 50ML LL SCALE MARK (SYRINGE) ×6 IMPLANT
SYR BULB IRRIGATION 50ML (SYRINGE) ×1 IMPLANT
SYRINGE 10CC LL (SYRINGE) ×2 IMPLANT
TOOTHBRUSH ADULT (PERSONAL CARE ITEMS) ×2 IMPLANT
TOWEL OR 17X26 10 PK STRL BLUE (TOWEL DISPOSABLE) ×2 IMPLANT
TRAY FOLEY CATH 14FRSI W/METER (CATHETERS) ×2 IMPLANT
WATER STERILE IRR 1500ML POUR (IV SOLUTION) IMPLANT
YANKAUER SUCT BULB TIP 10FT TU (MISCELLANEOUS) ×2 IMPLANT

## 2013-06-19 NOTE — Anesthesia Postprocedure Evaluation (Signed)
  Anesthesia Post-op Note  Patient: Dorothy [name]  Procedure(s) Performed: Procedure(s) (LRB): BILATERAL SAGITTAL SPLIT OSTEOMY WITH RIGID FIXATION (Bilateral)  Patient Location: PACU  Anesthesia Type: General  Level of Consciousness: awake and alert   Airway and Oxygen Therapy: Patient Spontanous Breathing  Post-op Pain: mild  Post-op Assessment: Post-op Vital signs reviewed, Patient's Cardiovascular Status Stable, Respiratory Function Stable, Patent Airway and No signs of Nausea or vomiting  Last Vitals:  Filed Vitals:   06/19/13 1115  BP: 118/70  Pulse:   Temp: 36.7 C  Resp:     Post-op Vital Signs: stable   Complications: No apparent anesthesia complications

## 2013-06-19 NOTE — Anesthesia Preprocedure Evaluation (Addendum)
Anesthesia Evaluation  Patient identified by MRN, date of birth, ID band Patient awake    Reviewed: Allergy & Precautions, H&P , NPO status , Patient's Chart, lab work & pertinent test results  Airway Mallampati: II TM Distance: >3 FB Neck ROM: Full    Dental no notable dental hx.    Pulmonary neg pulmonary ROS,  breath sounds clear to auscultation  Pulmonary exam normal       Cardiovascular hypertension, Pt. on medications Rhythm:Regular Rate:Normal     Neuro/Psych negative neurological ROS  negative psych ROS   GI/Hepatic negative GI ROS, Neg liver ROS, hiatal hernia,   Endo/Other  diabetes, Type 2, Oral Hypoglycemic Agents  Renal/GU negative Renal ROS  negative genitourinary   Musculoskeletal negative musculoskeletal ROS (+) Fibromyalgia -  Abdominal   Peds negative pediatric ROS (+)  Hematology negative hematology ROS (+)   Anesthesia Other Findings   Reproductive/Obstetrics negative OB ROS                          Anesthesia Physical Anesthesia Plan  ASA: II  Anesthesia Plan: General   Post-op Pain Management:    Induction: Intravenous  Airway Management Planned: Nasal ETT  Additional Equipment:   Intra-op Plan:   Post-operative Plan: Extubation in OR  Informed Consent: I have reviewed the patients History and Physical, chart, labs and discussed the procedure including the risks, benefits and alternatives for the proposed anesthesia with the patient or authorized representative who has indicated his/her understanding and acceptance.   Dental advisory given  Plan Discussed with: CRNA  Anesthesia Plan Comments:         Anesthesia Quick Evaluation

## 2013-06-19 NOTE — Interval H&P Note (Signed)
History and Physical Interval Note:  06/19/2013 7:01 AM  Dorothy [name]  has presented today for surgery, with the diagnosis of prognathism retrognathism, mandibular hyproplasia, under developed  The various methods of treatment have been discussed with the patient and family. After consideration of risks, benefits and other options for treatment, the patient has consented to the Procedure: BILATERAL SAGITTAL SPLIT OSTEOTOMY WITH RIGID FIXATION as a surgical intervention .  The patient's history has been reviewed, patient examined, no change in status, stable for surgery.  I have reviewed the patient's chart and labs.  Questions were answered to the patient's satisfaction.     Debraann Livingstone JR,Maysun Meditz M

## 2013-06-19 NOTE — Transfer of Care (Signed)
Immediate Anesthesia Transfer of Care Note  Patient: Dorothy [name]  Procedure(s) Performed: Procedure(s): BILATERAL SAGITTAL SPLIT OSTEOMY WITH RIGID FIXATION (Bilateral)  Patient Location: PACU  Anesthesia Type:General  Level of Consciousness: awake, alert , oriented and patient cooperative  Airway & Oxygen Therapy: Patient Spontanous Breathing and Patient connected to face mask oxygen  Post-op Assessment: Report given to PACU RN, Post -op Vital signs reviewed and stable and Patient moving all extremities X 4  Post vital signs: stable  Complications: No apparent anesthesia complications

## 2013-06-20 LAB — GLUCOSE, CAPILLARY: Glucose-Capillary: 153 mg/dL — ABNORMAL HIGH (ref 70–99)

## 2013-06-20 LAB — BASIC METABOLIC PANEL
Chloride: 98 mEq/L (ref 96–112)
GFR calc Af Amer: 68 mL/min — ABNORMAL LOW (ref >90)
GFR calc non Af Amer: 59 mL/min — ABNORMAL LOW (ref >90)
Potassium: 3.5 mEq/L (ref 3.5–5.1)
Sodium: 136 mEq/L (ref 135–145)

## 2013-06-20 LAB — CBC
HCT: 35.8 % — ABNORMAL LOW (ref 36.0–46.0)
MCHC: 33.8 g/dL (ref 30.0–36.0)
MCV: 91.8 fL (ref 78.0–100.0)
RBC: 3.9 MIL/uL (ref 3.87–5.11)
RDW: 13.8 % (ref 11.5–15.5)
WBC: 11.8 10*3/uL — ABNORMAL HIGH (ref 4.0–10.5)

## 2013-07-04 ENCOUNTER — Encounter (HOSPITAL_COMMUNITY): Payer: Self-pay | Admitting: Oral Surgery

## 2013-07-16 ENCOUNTER — Ambulatory Visit
Admission: RE | Admit: 2013-07-16 | Discharge: 2013-07-16 | Disposition: A | Discharge status: Home / Self Care | Payer: 59 | Source: Ambulatory Visit | Attending: Family Medicine | Admitting: Family Medicine

## 2013-07-16 ENCOUNTER — Other Ambulatory Visit: Payer: Self-pay | Admitting: Physician Assistant

## 2013-07-16 DIAGNOSIS — Z9882 Breast implant status: Secondary | ICD-10-CM

## 2013-07-16 DIAGNOSIS — Z1231 Encounter for screening mammogram for malignant neoplasm of breast: Secondary | ICD-10-CM

## 2013-07-17 ENCOUNTER — Encounter (HOSPITAL_COMMUNITY): Payer: Self-pay | Admitting: Oral Surgery

## 2013-07-28 ENCOUNTER — Ambulatory Visit (INDEPENDENT_AMBULATORY_CARE_PROVIDER_SITE_OTHER): Payer: 59 | Admitting: Family Medicine

## 2013-07-28 ENCOUNTER — Ambulatory Visit: Payer: 59 | Admitting: Family Medicine

## 2013-07-28 ENCOUNTER — Encounter: Payer: Self-pay | Admitting: Family Medicine

## 2013-07-28 VITALS — BP 130/80 | HR 60 | Temp 98.2°F | Resp 16 | Ht 65.0 in | Wt 163.0 lb

## 2013-07-28 DIAGNOSIS — F411 Generalized anxiety disorder: Secondary | ICD-10-CM

## 2013-07-28 DIAGNOSIS — E119 Type 2 diabetes mellitus without complications: Secondary | ICD-10-CM

## 2013-07-28 DIAGNOSIS — Z23 Encounter for immunization: Secondary | ICD-10-CM

## 2013-07-28 DIAGNOSIS — E785 Hyperlipidemia, unspecified: Secondary | ICD-10-CM

## 2013-07-28 DIAGNOSIS — E782 Mixed hyperlipidemia: Secondary | ICD-10-CM

## 2013-07-28 LAB — CBC WITH DIFFERENTIAL/PLATELET
Basophils Relative: 0 % (ref 0–1)
Eosinophils Absolute: 0 10*3/uL (ref 0.0–0.7)
Lymphocytes Relative: 33 % (ref 12–46)
Lymphs Abs: 1.3 10*3/uL (ref 0.7–4.0)
MCH: 30.2 pg (ref 26.0–34.0)
Neutro Abs: 2.3 10*3/uL (ref 1.7–7.7)
Neutrophils Relative %: 59 % (ref 43–77)
Platelets: 237 10*3/uL (ref 150–400)
RBC: 3.98 MIL/uL (ref 3.87–5.11)
WBC: 3.9 10*3/uL — ABNORMAL LOW (ref 4.0–10.5)

## 2013-07-28 LAB — LIPID PANEL
HDL: 76 mg/dL (ref >39)
LDL Cholesterol: 83 mg/dL (ref 0–99)
Total CHOL/HDL Ratio: 2.6 Ratio

## 2013-07-28 LAB — COMPREHENSIVE METABOLIC PANEL
ALT: 17 U/L (ref 0–35)
Alkaline Phosphatase: 61 U/L (ref 39–117)
CO2: 27 mEq/L (ref 19–32)
Calcium: 9.1 mg/dL (ref 8.4–10.5)
Chloride: 102 mEq/L (ref 96–112)
Sodium: 139 mEq/L (ref 135–145)
Total Bilirubin: 0.5 mg/dL (ref 0.3–1.2)
Total Protein: 6.2 g/dL (ref 6.0–8.3)

## 2013-07-28 LAB — HEMOGLOBIN A1C: Hgb A1c MFr Bld: 6.6 % — ABNORMAL HIGH (ref <5.7)

## 2013-07-28 NOTE — Progress Notes (Signed)
Subjective:    Patient ID: Dorothy [name], female    DOB: [birth year], 57 y.o.   MRN: [id number]  Diabetes Pertinent negatives for hypoglycemia include no dizziness, headaches, nervousness/anxiousness, seizures, speech difficulty or tremors. Pertinent negatives for diabetes include no chest pain, no fatigue, no polydipsia, no polyphagia, no polyuria and no weakness.   This 57 y.o. female presents for four month follow-up:  1.  Mandible surgery: 06/20/13; did really well; suffered with n/v/d post-operatively. Diarrhea persisted for three weeks. Suffering with chin numbness.  2. DMII:  Three month follow-up; no changes to management made at last visit; last HgbA1c of 6.3; compliance with medication; good tolerance to medication; good symptom control.  Does not check sugars at home.    3.  Hyperlipidemia: three month follow-up; no changes to management made at last visit.  Triglycerides normal at last visit. Reports good compliance with medication; good tolerance to medication; good symptom control.  4.  Depression/anxiety:  Three month follow-up; no changes to management made at last visit; major home stressors with husband's infidelity; was undergoing marital counseling; has not undergone counseling in past two months.  Sons are stable; "same old same old"; sons did not come for Thanksgiving which was good; "they always hit [place] up for money".   5.  Wrist strain: Improved.  No persistent pain.  6.  Toe contusion: improved.  No persistent pain.  7. Vertigo: no recent issues.  Review of Systems  Constitutional: Negative for fever, chills, diaphoresis and fatigue.  Eyes: Negative for visual disturbance.  Respiratory: Negative for cough and shortness of breath.   Cardiovascular: Negative for chest pain, palpitations and leg swelling.  Gastrointestinal: Negative for nausea, vomiting, abdominal pain and diarrhea.  Endocrine: Negative for cold intolerance, heat intolerance, polydipsia, polyphagia  and polyuria.  Neurological: Positive for numbness. Negative for dizziness, tremors, seizures, syncope, facial asymmetry, speech difficulty, weakness, light-headedness and headaches.  Psychiatric/Behavioral: Negative for sleep disturbance and dysphoric mood. The patient is not nervous/anxious.    Past Medical History  Diagnosis Date  . Fatty liver   . GERD (gastroesophageal reflux disease)   . Gastritis   . Hyperlipidemia   . Depression   . Anxiety   . Choledocholithiasis   . Hx of adenomatous colonic polyps   . Diabetes mellitus   . Migraine   . Osteoarthritis   . Peptic ulcer, unspecified site, unspecified as acute or chronic, without mention of hemorrhage, perforation, or obstruction   . Otosclerosis, unspecified   . B12 deficiency   . Hematuria, unspecified   . Unspecified disorder of kidney and ureter   . Osteoporosis, unspecified   . Meniere's disease, unspecified   . Benign paroxysmal positional vertigo   . Symptomatic menopausal or female climacteric states   . Allergic rhinitis, cause unspecified   . Fatty liver 09/04/12  . Hiatal hernia   . Fibromyalgia   . Mononeuritis of unspecified site    Allergies  Allergen Reactions  . Adhesive [Tape] Other (See Comments)    Tears skins  . Bactrim [Sulfamethoxazole-Tmp Ds] Other (See Comments)    blisters  . Codeine Itching  . Keflex [Cephalexin]   . Macrobid [Nitrofurantoin Macrocrystal] Other (See Comments)    blisters  . Nitrofurantoin Nausea And Vomiting and Other (See Comments)    Blisters   . Pentazocine Lactate     Pt not sure  . Phenazopyridine     Pt not sure  . Sulfa Drugs Cross Reactors Itching  . Sulfamethoxazole-Trimethoprim Itching  .  Talwin [Pentazocine] Nausea And Vomiting and Rash    Muscle cramps and vision changes   Current outpatient prescriptions:aspirin EC 81 MG tablet, Take 81 mg by mouth every morning. , Disp: , Rfl: ;  Calcium Carb-Cholecalciferol (CALCIUM 500 +D) 500-400 MG-UNIT TABS, Take  1 tablet by mouth daily. , Disp: , Rfl: ;  Cholecalciferol (VITAMIN D3) 2000 UNITS TABS, Take 2,000 Units by mouth daily. , Disp: , Rfl: ;  Cyanocobalamin (VITAMIN B 12 PO), Take 1,500 mg by mouth daily., Disp: , Rfl:  esomeprazole (NEXIUM) 40 MG capsule, Take 40 mg by mouth every evening., Disp: , Rfl: ;  estrogens, conjugated, (PREMARIN) 0.625 MG tablet, Take 1 tablet (0.625 mg total) by mouth daily. Take daily for 21 days then do not take for 7 days., Disp: 30 tablet, Rfl: 5;  fluticasone (FLONASE) 50 MCG/ACT nasal spray, Place 2 sprays into the nose daily., Disp: , Rfl: ;  loperamide (IMODIUM) 2 MG capsule, Take 2 mg by mouth daily., Disp: , Rfl:  metFORMIN (GLUCOPHAGE) 1000 MG tablet, Take 1 tablet (1,000 mg total) by mouth 2 (two) times daily with a meal., Disp: 60 tablet, Rfl: 3;  omega-3 acid ethyl esters (LOVAZA) 1 G capsule, Take 1 g by mouth 2 (two) times daily., Disp: , Rfl: ;  ONETOUCH DELICA LANCETS 33G MISC, USE TO TEST BLOOD SUGAR DAILY, Disp: 100 each, Rfl: 1;  pregabalin (LYRICA) 50 MG capsule, Take 50 mg by mouth 2 (two) times daily., Disp: , Rfl:  simvastatin (ZOCOR) 40 MG tablet, Take 40 mg by mouth every evening., Disp: , Rfl: ;  triamterene-hydrochlorothiazide (DYAZIDE) 37.5-25 MG per capsule, Take 2 capsules by mouth every morning., Disp: , Rfl: ;  triamterene-hydrochlorothiazide (DYAZIDE) 37.5-25 MG per capsule, Take 2 capsules by mouth every morning., Disp: , Rfl: ;  venlafaxine XR (EFFEXOR-XR) 75 MG 24 hr capsule, Take 75 mg by mouth every evening., Disp: , Rfl:     Objective:   Physical Exam  Nursing note and vitals reviewed. Constitutional: She is oriented to person, place, and time. She appears well-developed and well-nourished. No distress.  HENT:  Head: Normocephalic and atraumatic.  Eyes: Conjunctivae and EOM are normal. Pupils are equal, round, and reactive to light.  Neck: Normal range of motion. Neck supple. No thyromegaly present.  Cardiovascular: Normal rate,  regular rhythm and normal heart sounds.  Exam reveals no gallop and no friction rub.   No murmur heard. Pulmonary/Chest: Effort normal and breath sounds normal. She has no wheezes. She has no rales.  Abdominal: Soft. Bowel sounds are normal. She exhibits no distension. There is no tenderness. There is no rebound and no guarding.  Lymphadenopathy:    She has no cervical adenopathy.  Neurological: She is alert and oriented to person, place, and time.  Skin: Skin is warm and dry. She is not diaphoretic.  Psychiatric: She has a normal mood and affect. Her behavior is normal. Judgment and thought content normal.   INFLUENZA VACCINE ADMINISTERED IN OFFICE.    Assessment & Plan:  Type II or unspecified type diabetes mellitus without mention of complication, not stated as uncontrolled - Plan: HM Diabetes Foot Exam, Hemoglobin A1c  Need for prophylactic vaccination and inoculation against influenza - Plan: Flu Vaccine QUAD 36+ mos IM  ANXIETY  HYPERLIPIDEMIA - Plan: CBC with Differential, Comprehensive metabolic panel, Lipid panel  1.  DMII: controlled; obtain labs; continue current medications. 2.  Hyperlipidemia: controlled; obtain labs; continue current medications. 3.  Depression/anxiety: controlled; continue current medications.  4.  S/p flu vaccine. 5.  Wrist strain: resolved. 6.  Toe contusion: resolved.  No orders of the defined types were placed in this encounter.

## 2013-07-31 NOTE — Discharge Summary (Signed)
Physician Discharge Summary  Patient ID: Dorothy [name] MRN: [id number] DOB/AGE: Sep 13, 1955 57 y.o.  Admit date: 06/19/2013 Discharge date:06/20/2013  Admission Diagnoses: Mandibular Hypoplasia  Discharge Diagnoses: Mandibular Hypoplasia Active Problems:   * No active hospital problems. 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  She was stable after surgery and was kept 23 hours for observation and then discharged to home with follow up with Dr. Manson Passey.  Consults: None  Significant Diagnostic Studies: none  Treatments: surgery: Bilateral Sagittal Split Osteotomy  Discharge Exam: Blood pressure 112/70, pulse 73, temperature 98.4 F (36.9 C), temperature source Axillary, resp. rate 18, height 5\' 4"  (1.626 m), weight 75.297 kg (166 lb), SpO2 100.00%. General appearance: alert and cooperative  Disposition: 01-Home or Self Care  Discharge Orders   Future Appointments Provider Department Dept Phone   10/27/2013 8:00 AM Ethelda Chick, MD [facility] [phone]   01/12/2014 8:30 AM Ethelda Chick, MD [facility] [phone]   Future Orders Complete By Expires   Diet - low sodium heart healthy  As directed    Increase activity slowly  As directed        Medication List    STOP taking these medications       glucose blood test strip     Lancets Misc      TAKE these medications       aspirin EC 81 MG tablet  Take 81 mg by mouth every morning.     CALCIUM 500 +D 500-400 MG-UNIT Tabs  Generic drug:  Calcium Carb-Cholecalciferol  Take 1 tablet by mouth daily.     esomeprazole 40 MG capsule  Commonly known as:  NEXIUM  Take 40 mg by mouth every evening.     estrogens (conjugated) 0.625 MG tablet  Commonly known as:  PREMARIN  Take 1 tablet (0.625 mg total) by mouth daily. Take daily for 21 days then do not take for 7 days.     fluticasone 50 MCG/ACT  nasal spray  Commonly known as:  FLONASE  Place 2 sprays into the nose daily.     loperamide 2 MG capsule  Commonly known as:  IMODIUM  Take 2 mg by mouth daily.     metFORMIN 1000 MG tablet  Commonly known as:  GLUCOPHAGE  Take 1 tablet (1,000 mg total) by mouth 2 (two) times daily with a meal.     omega-3 acid ethyl esters 1 G capsule  Commonly known as:  LOVAZA  Take 1 g by mouth 2 (two) times daily.     pregabalin 50 MG capsule  Commonly known as:  LYRICA  Take 50 mg by mouth 2 (two) times daily.     simvastatin 40 MG tablet  Commonly known as:  ZOCOR  Take 40 mg by mouth every evening.     triamterene-hydrochlorothiazide 37.5-25 MG per capsule  Commonly known as:  DYAZIDE  Take 2 capsules by mouth every morning.     triamterene-hydrochlorothiazide 37.5-25 MG per capsule  Commonly known as:  DYAZIDE  Take 2 capsules by mouth every morning.     venlafaxine XR 75 MG 24 hr capsule  Commonly known as:  EFFEXOR-XR  Take 75 mg by mouth every evening.     VITAMIN B 12 PO  Take 1,500 mg by mouth daily.     Vitamin D3 2000 UNITS Tabs  Take 2,000 Units by mouth  daily.           Follow-up Information   Call Marlou Sa, DDS. (Come to the office after discharge)    Specialty:  Oral Surgery   Contact information:   [address] [place] [address] [phone]       Signed: Corley,Alanta Scobey L 07/31/2013, 7:48 AM

## 2013-07-31 NOTE — Op Note (Addendum)
06/19/2013   1:00PM  PATIENT:  Dorothy [name]  57 y.o. female  PRE-OPERATIVE DIAGNOSIS:  Mandibular Hypoplasia   POST-OPERATIVE DIAGNOSIS:  Mandibular Hypoplasia  PROCEDURE:  Procedure(s): BILATERAL SAGITTAL SPLIT OSTEOMY WITH RIGID FIXATION  INDICATIONS FOR PROCEDURE: The patient was referred for prosthetic reconstruction of her posterior mandible with dental implants; however, she also has a significant Class II skeletal deficiency.  This deficiency required a bilateral sagittal split osteotomy advancement to correct her occlusion.  SURGEON:  Surgeon(s): Ubaldo Glassing, DDS Francene Finders, DDS  PHYSICIAN ASSISTANT: None  ASSISTANTS: none   PROCEDURE IN DETAIL: The patient was seen in the preoperative area. All of the patient's questions were answered and the consent was reviewed and signed .  The history and physical was also updated and verified.  The patient was taken to the operating room by the anesthesia service.   Patient was placed on the table in the supine position and nasally intubated. The patient was prepped and draped in the usual sterile fashion for all maxillofacial surgery procedures.  A moisten raytec was placed in the patient oropharynx.  Next,  2% Lidocaine with 1:100,000 epinephrine and 0.5% Marcaine with 1:200,000 epinephrine was then used to block the left and right Inferior Alveolar Nerve and Long Buccal Nerve.     Next, Bovie cautery set at 30/30 was used to make a standard bilateral sagittal split incision which was a full thickness mucoperiosteal flap along the right anterior ramus from the first molar up to the level of the temporalis tendon insertion.  Next 15 blade was used to make release distal to the second molar.  Next, a periosteal elevator was used to raise the flap.    The lingula was indentified using a nerve hook and then a [employer] was used to make the medial cut just superior to it.  Next a 701 bur was used to make the sagittal cut  and then a 703 was used to make the lateral and inferior cuts.  Then, a sequence of increasing larger osteotomes were used to widen the osteotomy and separate the distal and proximal mandible.  The nerve was intact and the condyle was attached to the proximal segment.  This was then repeated on the left mandible.  The mandible was advanced and the patient was placed into MMF with 24 gauge wire bilaterally times two and then anteriorly.  The right distal and proximal segments were clamped with a bone clamp.  The right mandible was fixated with 2 superior border KLS positional screws and 1 inferior border screw.  The same was repeated for the left mandible.   The MMF was taken down and the mouth was irrigated.  The throat pack was removed and the patient was suctioned.  Occlusion was stable and repeatable into a class one occlusion.  Copious irrigation with normal saline was performed again and then 3-0 Vicryl was used to close.  Heavy elastics were used to place the patient back into MMF.  All counts were correct times two.  Patient was extubated and taken to the PACU were he recovered well.  ANESTHESIA:   general  EBL:  [name] than 200 mL  DRAINS: none   LOCAL MEDICATIONS USED:  MARCAINE    and LIDOCAINE   SPECIMEN:  No Specimen  DISPOSITION OF SPECIMEN:  N/A  COUNTS:  YES   PLAN OF CARE: Admit to inpatient   PATIENT DISPOSITION:  PACU - hemodynamically stable.   Delay start of Pharmacological VTE  agent (>24hrs) due to surgical blood loss or risk of bleeding:  not applicable

## 2013-08-18 ENCOUNTER — Ambulatory Visit: Payer: 59 | Admitting: Family Medicine

## 2013-09-26 ENCOUNTER — Telehealth: Payer: Self-pay

## 2013-09-26 NOTE — Telephone Encounter (Signed)
Patient would like a ROI faxed to her.   Fax [phone]  Cell number- [phone]

## 2013-09-26 NOTE — Telephone Encounter (Signed)
Release form faxed with confirmation.

## 2013-10-27 ENCOUNTER — Encounter: Payer: Self-pay | Admitting: Family Medicine

## 2013-10-27 ENCOUNTER — Ambulatory Visit: Payer: 59

## 2013-10-27 ENCOUNTER — Ambulatory Visit (INDEPENDENT_AMBULATORY_CARE_PROVIDER_SITE_OTHER): Payer: 59 | Admitting: Family Medicine

## 2013-10-27 VITALS — BP 116/68 | HR 69 | Temp 98.2°F | Resp 16 | Ht 65.0 in | Wt 165.0 lb

## 2013-10-27 DIAGNOSIS — R0602 Shortness of breath: Secondary | ICD-10-CM

## 2013-10-27 DIAGNOSIS — F329 Major depressive disorder, single episode, unspecified: Secondary | ICD-10-CM

## 2013-10-27 DIAGNOSIS — E119 Type 2 diabetes mellitus without complications: Secondary | ICD-10-CM

## 2013-10-27 DIAGNOSIS — K219 Gastro-esophageal reflux disease without esophagitis: Secondary | ICD-10-CM

## 2013-10-27 DIAGNOSIS — R945 Abnormal results of liver function studies: Secondary | ICD-10-CM

## 2013-10-27 DIAGNOSIS — R7989 Other specified abnormal findings of blood chemistry: Secondary | ICD-10-CM

## 2013-10-27 DIAGNOSIS — R5381 Other malaise: Secondary | ICD-10-CM

## 2013-10-27 DIAGNOSIS — R5383 Other fatigue: Secondary | ICD-10-CM

## 2013-10-27 DIAGNOSIS — F3289 Other specified depressive episodes: Secondary | ICD-10-CM

## 2013-10-27 DIAGNOSIS — E7800 Pure hypercholesterolemia, unspecified: Secondary | ICD-10-CM

## 2013-10-27 LAB — CBC WITH DIFFERENTIAL/PLATELET
BASOS PCT: 0 % (ref 0–1)
Basophils Absolute: 0 10*3/uL (ref 0.0–0.1)
EOS ABS: 0.2 10*3/uL (ref 0.0–0.7)
EOS PCT: 3 % (ref 0–5)
HCT: 40.9 % (ref 36.0–46.0)
HEMOGLOBIN: 13.6 g/dL (ref 12.0–15.0)
LYMPHS ABS: 2 10*3/uL (ref 0.7–4.0)
Lymphocytes Relative: 36 % (ref 12–46)
MCH: 29.2 pg (ref 26.0–34.0)
MCHC: 33.3 g/dL (ref 30.0–36.0)
MCV: 87.8 fL (ref 78.0–100.0)
MONO ABS: 0.4 10*3/uL (ref 0.1–1.0)
MONOS PCT: 7 % (ref 3–12)
Neutro Abs: 3 10*3/uL (ref 1.7–7.7)
Neutrophils Relative %: 54 % (ref 43–77)
Platelets: 273 10*3/uL (ref 150–400)
RBC: 4.66 MIL/uL (ref 3.87–5.11)
RDW: 14.1 % (ref 11.5–15.5)
WBC: 5.5 10*3/uL (ref 4.0–10.5)

## 2013-10-27 LAB — COMPLETE METABOLIC PANEL WITH GFR
ALK PHOS: 65 U/L (ref 39–117)
ALT: 16 U/L (ref 0–35)
AST: 15 U/L (ref 0–37)
Albumin: 4.5 g/dL (ref 3.5–5.2)
BILIRUBIN TOTAL: 0.4 mg/dL (ref 0.2–1.2)
BUN: 28 mg/dL — AB (ref 6–23)
CO2: 27 meq/L (ref 19–32)
CREATININE: 1.1 mg/dL (ref 0.50–1.10)
Calcium: 10.2 mg/dL (ref 8.4–10.5)
Chloride: 99 mEq/L (ref 96–112)
GFR, EST NON AFRICAN AMERICAN: 56 mL/min — AB
GFR, Est African American: 64 mL/min
GLUCOSE: 146 mg/dL — AB (ref 70–99)
Potassium: 3.7 mEq/L (ref 3.5–5.3)
Sodium: 139 mEq/L (ref 135–145)
Total Protein: 7 g/dL (ref 6.0–8.3)

## 2013-10-27 LAB — HEMOGLOBIN A1C
HEMOGLOBIN A1C: 6.8 % — AB (ref <5.7)
Mean Plasma Glucose: 148 mg/dL — ABNORMAL HIGH (ref <117)

## 2013-10-27 LAB — LIPID PANEL
CHOLESTEROL: 213 mg/dL — AB (ref 0–200)
HDL: 87 mg/dL (ref >39)
LDL Cholesterol: 83 mg/dL (ref 0–99)
TRIGLYCERIDES: 214 mg/dL — AB (ref <150)
Total CHOL/HDL Ratio: 2.4 Ratio
VLDL: 43 mg/dL — ABNORMAL HIGH (ref 0–40)

## 2013-10-27 NOTE — Progress Notes (Addendum)
Subjective:  Patient ID: Dorothy [name], female    DOB: 1956-02-08, 58 y.o.   MRN: [id number]  HPI  This chart was scribed for Dorothy Forts, MD, by Dorothy [name], ED Scribe. This patient was seen in room 21 and the patient's care was started at 8:31 AM.  HPI Comments: Dorothy [name] is a 58 y.o. female who presents to the [facility] for a DM, high cholesterol and depression follow-up appointment. During her previous appointment three months ago, her hemoglobin a1c recorded was 6.6, no changes to management were made, and cholesterol was under control, with the exception of high triglycerides. Patient states that she checks her blood sugar every other day with an average recording of 130-180 in the afternoons.  Patient states she has had constant indigestion over the past week due to additional stress at home. She states she has been taking Nexium and OTC medication/Zantac 376m qhs with moderate relief. She denies any black or bloody stools. She has also been taking Carafate PRN.  Patient reports she has been emotionally stable and states her treatment for depression (venlafaxine) has been satisfactory. Patient states that 4-5 days ago, she had symptoms of vertigo while lying in bed at night, and has attributed this new symptom to a constant, non changing post-nasal drip. Patient states that she typically has a runny nose during this time of season due to allergies; compliance with Flonase; suffered with sores in nose with Astelin.  Uses Netti Pot PRN.  Patient also states that recently she has been experiencing intermittent SOB, with mild exertion, such as walking or sweeping the floor. She reports it has progressively worsened over the last three months and that she first noticed the associated fatigue one month ago. Patient states she is afraid to exercise due to this condition and reports that she experiences an infrequent, mild "fluttering" in her chest during these  episodes. She reports no associated CP, cough, or nausea/vomiting. No leg pain or calf pain; no leg swelling.  No associated diaphoresis.  Did undergo mandible surgery in 05/2013; onset of DOE soon afterwards.  No regular exercise. No recent weight gain.  Takes a four hour nap every day after work 2:00pm to 6:00pm.  Last  Sleep study 12 years ago.    Patient has a history of fibromyalgia and states she is due to check her liver functioning as per Dr. DEstanislado Pandy rheumatologist. Underwent LFTs one month ago and were elevated at rheumatology office.  Additionally, she states that she was recommended to stop drinking caffeinated products, including teas and sodas.   Patient states that she has been progressively recovering from her jaw surgery that occurred on 06/19/2013, and has some expected numbness in the area. She denies any pain and states that her drooling has improved.  Past Medical History  Diagnosis Date  . Fatty liver   . GERD (gastroesophageal reflux disease)   . Gastritis   . Hyperlipidemia   . Depression   . Anxiety   . Choledocholithiasis   . Hx of adenomatous colonic polyps   . Diabetes mellitus   . Migraine   . Osteoarthritis   . Peptic ulcer, unspecified site, unspecified as acute or chronic, without mention of hemorrhage, perforation, or obstruction   . Otosclerosis, unspecified   . B12 deficiency   . Hematuria, unspecified   . Unspecified disorder of kidney and ureter   . Osteoporosis, unspecified   . Meniere's disease, unspecified   . Benign paroxysmal positional vertigo   .  Symptomatic menopausal or female climacteric states   . Allergic rhinitis, cause unspecified   . Fatty liver 09/04/12  . Hiatal hernia   . Fibromyalgia   . Mononeuritis of unspecified site     Past Surgical History  Procedure Laterality Date  . Cholecystectomy    . Tubal ligation    . Shoulder surgery      left  . Breast enhancement surgery Bilateral   . Foot surgery      right  . Mouth  surgery    . Bile duct exploration      gallstone removed  . Total abdominal hysterectomy      ovaries intact  . 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 Colon cancer Cousin     died age 4  . Prostate cancer Father   . Lung disease Father   . Diabetes Father   . Heart disease Father 10    CHF, AMI multiple/CABG  . COPD Father   . Cancer Father     prostate cancer  . Breast cancer Maternal Aunt   . Diabetes Mother   . Hypertension Mother   . Hyperlipidemia Mother   . COPD Mother   . Cancer Mother     lung cancer  . Diabetes Brother   . Hypertension Brother   . Obesity Brother   . Stroke Maternal Grandmother   . Heart disease Maternal Grandfather   . COPD Paternal Grandmother   . Diabetes Paternal Grandmother   . COPD Paternal Grandfather     History   Social History  . Marital Status: Married    Spouse Name: Dorothy [name]    Number of Children: 3  . 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 Smoking status: Never Smoker   . Smokeless tobacco: Never Used  . Alcohol Use: Yes     Comment: occasional 1 x month  . Drug Use: No  . Sexual Activity: Yes   Other Topics Concern  . Not on file   Social History Narrative   Always uses seat belts. Smoke alarm and carbon monoxide detector in the home.No caffeine use. No unsecured guns in the home.    Marital status:  Married x 17 years;Happily,no abuse. 2nd marriage.      Children: 3 sons (13, 60, 12); 3 grandchildren; 3 step grandchildren; 1 gg.      Lives: with husband.      Employment:  Working 15-20 hours per week at [employer]; loves job.      Tobacco:       Alcohol:       Drugs:      Exercise:  Inactive/none.    Allergies  Allergen Reactions  . Adhesive [Tape] Other (See Comments)    Tears skins  . Bactrim [Sulfamethoxazole-Tmp Ds] Other (See Comments)    blisters  . Codeine Itching  . Keflex [Cephalexin]   . Macrobid [Nitrofurantoin Macrocrystal] Other (See Comments)    blisters  . Nitrofurantoin Nausea And Vomiting and Other (See Comments)    Blisters   . Pentazocine Lactate     Pt not sure  . Phenazopyridine     Pt not sure  .  Sulfa Drugs Cross Reactors Itching  . Sulfamethoxazole-Trimethoprim Itching  . Talwin [Pentazocine] Nausea And Vomiting and Rash    Muscle cramps and vision changes    Patient Active Problem List   Diagnosis Date Noted  . Routine general medical examination at a health care facility 02/25/2013  . Pure hypercholesterolemia 02/25/2013  . Need for hepatitis B vaccination 02/25/2013  . Routine gynecological examination 02/25/2013  . Fibromyalgia 10/03/2012  . Abdominal pain, epigastric 10/01/2012  . DIARRHEA, CHRONIC 10/26/2009  . COLONIC POLYPS, ADENOMATOUS 09/21/2007  . DIABETES MELLITUS 09/21/2007  . HYPERLIPIDEMIA 09/21/2007  . ANXIETY 09/21/2007  . DEPRESSION 09/21/2007  . GERD 09/21/2007  . HIATAL HERNIA 09/21/2007  . CHOLEDOCHOLITHIASIS, HX OF 09/21/2007  . GASTRITIS, ACUTE 07/30/2007    Results for orders placed in visit on 07/28/13  CBC WITH DIFFERENTIAL      Result Value Ref Range   WBC 3.9 (*) 4.0 - 10.5 K/uL   RBC 3.98  3.87 - 5.11 MIL/uL   Hemoglobin 12.0  12.0 - 15.0 g/dL   HCT 35.3 (*) 36.0 - 46.0 %   MCV 88.7  78.0 - 100.0 fL   MCH 30.2  26.0 - 34.0 pg   MCHC 34.0  30.0 - 36.0 g/dL   RDW 13.9  11.5 - 15.5 %   Platelets 237  150 - 400 K/uL   Neutrophils Relative % 59  43 - 77 %   Neutro Abs 2.3  1.7 - 7.7 K/uL   Lymphocytes Relative 33  12 - 46 %   Lymphs Abs 1.3  0.7 - 4.0 K/uL   Monocytes Relative 7  3 - 12 %   Monocytes Absolute 0.3  0.1 - 1.0 K/uL   Eosinophils Relative 1  0 - 5 %   Eosinophils  Absolute 0.0  0.0 - 0.7 K/uL   Basophils Relative 0  0 - 1 %   Basophils Absolute 0.0  0.0 - 0.1 K/uL   Smear Review Criteria for review not met    COMPREHENSIVE METABOLIC PANEL      Result Value Ref Range   Sodium 139  135 - 145 mEq/L   Potassium 3.4 (*) 3.5 - 5.3 mEq/L   Chloride 102  96 - 112 mEq/L   CO2 27  19 - 32 mEq/L   Glucose, Bld 129 (*) 70 - 99 mg/dL   BUN 20  6 - 23 mg/dL   Creat 1.02  0.50 - 1.10 mg/dL   Total Bilirubin 0.5  0.3 - 1.2 mg/dL   Alkaline Phosphatase 61  39 - 117 U/L   AST 17  0 - 37 U/L   ALT 17  0 - 35 U/L   Total Protein 6.2  6.0 - 8.3 g/dL   Albumin 4.3  3.5 - 5.2 g/dL   Calcium 9.1  8.4 - 10.5 mg/dL  HEMOGLOBIN A1C      Result Value Ref Range   Hemoglobin A1C 6.6 (*) <5.7 %   Mean Plasma Glucose 143 (*) <117 mg/dL  LIPID PANEL      Result Value Ref Range   Cholesterol 197  0 - 200 mg/dL   Triglycerides 189 (*) <150 mg/dL   HDL 76  >39 mg/dL   Total CHOL/HDL Ratio 2.6     VLDL 38  0 - 40 mg/dL   LDL Cholesterol 83  0 - 99 mg/dL    Current Outpatient Prescriptions on File Prior to Visit  Medication Sig Dispense Refill  . Calcium Carb-Cholecalciferol (CALCIUM  500 +D) 500-400 MG-UNIT TABS Take 1 tablet by mouth daily.       . Cholecalciferol (VITAMIN D3) 2000 UNITS TABS Take 2,000 Units by mouth daily.       . Cyanocobalamin (VITAMIN B 12 PO) Take 1,500 mg by mouth daily.      [name] esomeprazole (NEXIUM) 40 MG capsule Take 40 mg by mouth every evening.      . estrogens, conjugated, (PREMARIN) 0.625 MG tablet Take 1 tablet (0.625 mg total) by mouth daily. Take daily for 21 days then do not take for 7 days.  30 tablet  5  . fluticasone (FLONASE) 50 MCG/ACT nasal spray Place 2 sprays into the nose daily.      [name] loperamide (IMODIUM) 2 MG capsule Take 2 mg by mouth daily.      . metFORMIN (GLUCOPHAGE) 1000 MG tablet Take 1 tablet (1,000 mg total) by mouth 2 (two) times daily with a meal.  60 tablet  3  . omega-3 acid ethyl esters (LOVAZA) 1 G capsule Take  1 g by mouth 2 (two) times daily.      [name] DELICA LANCETS 51G MISC USE TO TEST BLOOD SUGAR DAILY  100 each  1  . pregabalin (LYRICA) 50 MG capsule Take 50 mg by mouth 2 (two) times daily.      . simvastatin (ZOCOR) 40 MG tablet Take 40 mg by mouth every evening.      . triamterene-hydrochlorothiazide (DYAZIDE) 37.5-25 MG per capsule Take 2 capsules by mouth every morning.      . venlafaxine XR (EFFEXOR-XR) 75 MG 24 hr capsule Take 75 mg by mouth every evening.      [name] aspirin EC 81 MG tablet Take 81 mg by mouth every morning.        No current facility-administered medications on file prior to visit.   Triage Vitals: BP 116/68  Pulse 69  Temp(Src) 98.2 F (36.8 C)  Resp 16  Ht _0  (1.651 m)  Wt 165 lb (74.844 kg)  BMI 27.46 kg/m2  SpO2 98%  Review of Systems  Constitutional: Positive for fatigue. Negative for fever, chills, activity change and appetite change.  HENT: Positive for postnasal drip and rhinorrhea. Negative for drooling and sore throat.   Eyes: Negative.   Respiratory: Positive for shortness of breath. Negative for cough and chest tightness.   Cardiovascular: Positive for palpitations. Negative for chest pain and leg swelling.  Gastrointestinal: Negative for nausea, vomiting and diarrhea.       Indigestion   Musculoskeletal: Negative for joint swelling.  Neurological: Positive for dizziness (vertigo) and numbness (jaw, post-op). Negative for weakness.  Psychiatric/Behavioral: Negative for sleep disturbance and dysphoric mood.  All other systems reviewed and are negative.    Objective:  Physical Exam  Nursing note and vitals reviewed. Constitutional: She is oriented to person, place, and time. She appears well-developed and well-nourished. No distress.  HENT:  Head: Normocephalic and atraumatic.  Right Ear: Tympanic membrane normal.  Left Ear: Tympanic membrane normal.  Mouth/Throat: Oropharynx is clear and moist. No oropharyngeal exudate.  Eyes: EOM  are normal.  Neck: Normal range of motion. Neck supple.  Cardiovascular: Normal rate, regular rhythm and normal heart sounds.  Exam reveals no gallop.   No murmur heard. No swelling in legs.  Hommen's negative.  Pulmonary/Chest: Effort normal and breath sounds normal. No respiratory distress. She has no wheezes. She has no rales.  Abdominal: Soft. Bowel sounds are normal. There is tenderness (Mild epigastric tenderness.).  Musculoskeletal: Normal range of motion. She exhibits no edema.  Neurological: She is alert and oriented to person, place, and time.  Skin: Skin is warm and dry.  Psychiatric: She has a normal mood and affect. Her behavior is normal.   Orders placed in visit on 10/27/13  . EKG 12-LEAD   No results found for this or any previous visit (from the past 24 hour(s)).  [facility] reading (PRIMARY) by  Dr. Tamala Julian.  CXR: NAD.    EKG: NSR; no acute changes.   Assessment & Plan:  8:41 AM-Discussed my concern for cardiac conditions and sleep apnea related to patient's SOB. EKG ordered and will refer patient to a cardiologist. Ordered CBC with liver fx. Treatment plan discussed with patient and patient agrees.  9:45 AM-EKG performed. Diabetes - Plan: [facility] Diabetes Foot Exam, CBC with Differential, COMPLETE METABOLIC PANEL WITH GFR, Hemoglobin A1c, EKG 12-Lead  Pure hypercholesterolemia - Plan: Lipid panel, EKG 12-Lead  Shortness of breath - Plan: DG Chest 2 View, Ambulatory referral to Cardiology, CT Angio Chest W/Cm &/Or Wo Cm  Other malaise and fatigue - Plan: Ambulatory referral to Cardiology, CT Angio Chest W/Cm &/Or Wo Cm  Elevated LFTs  DEPRESSION  GERD  No orders of the defined types were placed in this encounter.    1. DMII: controlled; obtain labs; continue current medications. 2.  Hyperlipidemia: controlled; obtain labs; continue current medications. 3.  Depression: stable despite recent stressors with son's heroine overdose.  4.  SOB: New.  Ddx includes  post-operative PE after mandibular surgery versus anginal equivalent.  Refer for CT angio to evaluate for PE; refer to cardiology to rule out angina or valvular pathology.  To ED for acute worsening.   5.  Malaise and fatigue: chronic with recent worsening; obtain labs; refer for sleep study. 6.  GERD: worsening with recent stressors; continue Nexium daily, Zantac 372m qhs, Carafate PRN. If persistent, follow-up with GI. 7. Elevated LFTs: New at rheumatology; if persistent, hold statin and repeat.  If still persistent, obtain abdominal u/s and refer back to GI.  Known fatty liver which may be etiology.  I personally performed the services described in this documentation, which was scribed in my presence.  The recorded information has been reviewed and is accurate.  KReginia [name] M.D.  [facility]  [phone]

## 2013-10-29 ENCOUNTER — Other Ambulatory Visit: Payer: Self-pay | Admitting: Family Medicine

## 2013-10-30 ENCOUNTER — Other Ambulatory Visit: Payer: Self-pay | Admitting: Family Medicine

## 2013-10-30 ENCOUNTER — Other Ambulatory Visit: Payer: Self-pay | Admitting: Physician Assistant

## 2013-11-03 ENCOUNTER — Telehealth: Payer: Self-pay

## 2013-11-03 DIAGNOSIS — E7800 Pure hypercholesterolemia, unspecified: Secondary | ICD-10-CM

## 2013-11-03 DIAGNOSIS — R5383 Other fatigue: Secondary | ICD-10-CM

## 2013-11-03 DIAGNOSIS — R5381 Other malaise: Secondary | ICD-10-CM

## 2013-11-03 DIAGNOSIS — F3289 Other specified depressive episodes: Secondary | ICD-10-CM

## 2013-11-03 DIAGNOSIS — F329 Major depressive disorder, single episode, unspecified: Secondary | ICD-10-CM

## 2013-11-03 DIAGNOSIS — E119 Type 2 diabetes mellitus without complications: Secondary | ICD-10-CM

## 2013-11-03 NOTE — Telephone Encounter (Signed)
[facility] called to ask for change of order for noc polysomnography to REFERRAL to neuro. I am putting in corrected order. Dr Tamala Julian, [place].

## 2013-11-13 ENCOUNTER — Encounter: Payer: Self-pay | Admitting: Neurology

## 2013-11-13 ENCOUNTER — Ambulatory Visit (INDEPENDENT_AMBULATORY_CARE_PROVIDER_SITE_OTHER): Payer: 59 | Admitting: Neurology

## 2013-11-13 VITALS — BP 130/82 | HR 68 | Temp 97.4°F | Ht 65.0 in | Wt 170.0 lb

## 2013-11-13 DIAGNOSIS — G478 Other sleep disorders: Secondary | ICD-10-CM

## 2013-11-13 DIAGNOSIS — R0989 Other specified symptoms and signs involving the circulatory and respiratory systems: Secondary | ICD-10-CM

## 2013-11-13 DIAGNOSIS — R0609 Other forms of dyspnea: Secondary | ICD-10-CM

## 2013-11-13 DIAGNOSIS — R0683 Snoring: Secondary | ICD-10-CM

## 2013-11-13 DIAGNOSIS — R0602 Shortness of breath: Secondary | ICD-10-CM

## 2013-11-13 DIAGNOSIS — G2581 Restless legs syndrome: Secondary | ICD-10-CM

## 2013-11-13 DIAGNOSIS — G4761 Periodic limb movement disorder: Secondary | ICD-10-CM

## 2013-11-13 NOTE — Patient Instructions (Addendum)

## 2013-11-13 NOTE — Progress Notes (Signed)
Subjective:    Patient ID: Dorothy [name] is a 58 y.o. female.  HPI    Star Age, MD, PhD [facility] [address] [place], [place] [address]   Dear Dr. Tamala Julian,  I saw your patient, Dorothy [name], upon your kind request in my neurologic clinic today for initial consultation of her sleep disorder, in particular, concern for obstructive sleep apnea. The patient is unaccompanied today. As you know, Dorothy [name] is a 58 year old right-handed woman with an underlying complex medical history of diabetes, hyperlipidemia, depression, fatty liver, reflux disease, anxiety, gallstones, migraine headaches, osteoarthritis, peptic ulcer disease, vitamin B12 deficiency, Mnire's disease and benign positional vertigo, allergic rhinitis, hiatal hernia and fibromyalgia, who reports intermittent shortness of breath, palpitations, indigestion, and excessive daytime somnolence, with a need for daily nap after work between 2 and 6 PM. She did have a sleep study many years ago which she recalls showed snoring. She had a  bilateral sagittal split osteotomy advancement to correct her dental malocclusion in 10/14 and has been doing very well. She has braces on top, which may come out soon. 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 She is a non-smoker and drinks alcohol very occasionally and does not drink caffeine. She lost about 15 lb from the jaw surgery, but has mostly gained it back.   Her typical bedtime is reported to be around MN and usual wake time is around 6 AM. Sleep onset typically occurs within a few minutes. She reports feeling marginally rested upon awakening. She wakes up on an average 3 to 4 times in the middle of the night and has to go to the bathroom 1 times on a typical night. She denies morning headaches. She used to have migraines years ago. She also reports problems with her short-term memory. She reports excessive daytime somnolence  (EDS) and Her Epworth Sleepiness Score (ESS) is 5/24 today. She has not fallen asleep while driving. The patient has been taking a scheduled nap, which is usually 4 hours long. She does not really feel refreshed after a nap.  She has been known to snore for the past many years. Snoring is reportedly mild, and it is unclear if it is associated with choking sounds and witnessed apneas. The patient admits to a sense of gasping for air rarely. There is report of nighttime reflux, with no nighttime cough experienced. The patient has not noted any RLS symptoms and is known to be restless in her sleep. She jerks in her sleep and her RLS symptoms include an urge to move her legs, which bothers her about once a week. There is family history of RLS in her mother.   She denies cataplexy, sleep paralysis, hypnagogic or hypnopompic hallucinations, or sleep attacks. She does not report any vivid dreams, nightmares, dream enactments, or parasomnias, such as sleep walking.  Her bedroom is usually dark and cool. There is a TV in the bedroom and usually it is not on during the night, but she does watch TV up until lights out.    Her Past Medical History Is Significant For: Past Medical History  Diagnosis Date  . Fatty liver   . GERD (gastroesophageal reflux disease)   . Gastritis   . Hyperlipidemia   . Depression   . Anxiety   . Choledocholithiasis   . Hx of adenomatous colonic polyps   . Diabetes mellitus   . Migraine   . Osteoarthritis   . Peptic ulcer,  unspecified site, unspecified as acute or chronic, without mention of hemorrhage, perforation, or obstruction   . Otosclerosis, unspecified   . B12 deficiency   . Hematuria, unspecified   . Unspecified disorder of kidney and ureter   . Osteoporosis, unspecified   . Meniere's disease, unspecified   . Benign paroxysmal positional vertigo   . Symptomatic menopausal or female climacteric states   . Allergic rhinitis, cause unspecified   . Fatty liver 09/04/12   . Hiatal hernia   . Fibromyalgia   . Mononeuritis of unspecified site     Her Past Surgical History Is Significant For: Past Surgical History  Procedure Laterality Date  . Cholecystectomy    . Tubal ligation    . Shoulder surgery      left  . Breast enhancement surgery Bilateral   . Foot surgery      right  . Mouth surgery    . Bile duct exploration      gallstone removed  . Total abdominal hysterectomy      ovaries intact  . 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 Colon cancer Cousin     died age 44  . Prostate cancer Father   . Lung disease Father   . Diabetes Father   . Heart disease Father 23    CHF, AMI multiple/CABG  . COPD Father   . Cancer Father     prostate cancer  . Breast cancer Maternal Aunt   . Diabetes Mother   . Hypertension Mother   . Hyperlipidemia Mother   . COPD Mother   . Cancer Mother     lung cancer  . Diabetes Brother   . Hypertension Brother   . Obesity Brother   . Stroke Maternal Grandmother   . Heart disease Maternal Grandfather   . COPD Paternal Grandmother   . Diabetes Paternal Grandmother   . COPD Paternal Grandfather     Her Social History Is Significant For: History   Social History  . Marital Status: Married    Spouse Name: Richardson Landry    Number of Children: 3  . 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 Smoking status: Never Smoker   . Smokeless tobacco: Never Used  . Alcohol Use: Yes     Comment: occasional 1 x month  . Drug Use: No  . Sexual Activity: Yes   Other Topics Concern  . None   Social History Narrative   Always uses seat belts. Smoke alarm and  carbon monoxide detector in the home.No caffeine use. No unsecured guns in the home.    Marital status:  Married x 17 years;Happily,no abuse. 2nd marriage.      Children: 3 sons (3, 53, 35); 3 grandchildren; 3 step grandchildren; 1 gg.      Lives: with husband.      Employment:  Working 15-20 hours per week at [employer]; loves job.      Tobacco:       Alcohol:       Drugs:      Exercise: Inactive/none.    Her Allergies Are:  Allergies  Allergen Reactions  .  Adhesive [Tape] Other (See Comments)    Tears skins  . Bactrim [Sulfamethoxazole-Tmp Ds] Other (See Comments)    blisters  . Codeine Itching  . Keflex [Cephalexin]   . Macrobid [Nitrofurantoin Macrocrystal] Other (See Comments)    blisters  . Nitrofurantoin Nausea And Vomiting and Other (See Comments)    Blisters   . Pentazocine Lactate     Pt not sure  . Phenazopyridine     Pt not sure  . Sulfa Drugs Cross Reactors Itching  . Sulfamethoxazole-Trimethoprim Itching  . Talwin [Pentazocine] Nausea And Vomiting and Rash    Muscle cramps and vision changes  :   Her Current Medications Are:  Outpatient Encounter Prescriptions as of 11/13/2013  Medication Sig  . Calcium Carb-Cholecalciferol (CALCIUM 500 +D) 500-400 MG-UNIT TABS Take 1 tablet by mouth daily.   . Cholecalciferol (VITAMIN D3) 2000 UNITS TABS Take 2,000 Units by mouth daily.   . Cyanocobalamin (VITAMIN B 12 PO) Take 1,500 mg by mouth daily.  [name] esomeprazole (NEXIUM) 40 MG capsule Take 40 mg by mouth every evening.  . estrogens, conjugated, (PREMARIN) 0.625 MG tablet Take 1 tablet (0.625 mg total) by mouth daily. Take daily for 21 days then do not take for 7 days.  . fluticasone (FLONASE) 50 MCG/ACT nasal spray Place 2 sprays into the nose daily.  [name] loperamide (IMODIUM) 2 MG capsule Take 2 mg by mouth daily.  . metFORMIN (GLUCOPHAGE) 1000 MG tablet TAKE 1 TABLET BY MOUTH 2 TIMES DAILY WITH A MEAL.  [name] omega-3 acid ethyl esters (LOVAZA) 1 G capsule Take 1 g by  mouth 2 (two) times daily.  [name] DELICA LANCETS 42H MISC USE TO TEST BLOOD SUGAR DAILY  . pregabalin (LYRICA) 50 MG capsule Take 50 mg by mouth 2 (two) times daily.  . simvastatin (ZOCOR) 40 MG tablet Take 40 mg by mouth every evening.  . triamterene-hydrochlorothiazide (DYAZIDE) 37.5-25 MG per capsule Take 2 capsules by mouth every morning.  . venlafaxine XR (EFFEXOR-XR) 75 MG 24 hr capsule Take 75 mg by mouth every evening.  . [DISCONTINUED] aspirin EC 81 MG tablet Take 81 mg by mouth every morning.   . [DISCONTINUED] metFORMIN (GLUCOPHAGE) 1000 MG tablet TAKE 1 TABLET BY MOUTH 2 TIMES DAILY WITH A MEAL.  :  Review of Systems:  Out of a complete 14 point review of systems, all are reviewed and negative with the exception of these symptoms as listed below:  Review of Systems  Constitutional: Positive for fatigue.  HENT: Positive for hearing loss, rhinorrhea and tinnitus.   Eyes: Negative.   Respiratory: Positive for apnea (snoring) and shortness of breath.   Cardiovascular: Positive for chest pain.  Gastrointestinal: Positive for diarrhea.  Endocrine: Positive for heat intolerance and polydipsia.  Genitourinary: Negative.   Musculoskeletal: Positive for arthralgias and myalgias.  Skin: Negative.   Allergic/Immunologic: Positive for environmental allergies.  Neurological: Positive for dizziness.       Memory loss   Hematological: Negative.   Psychiatric/Behavioral: Positive for sleep disturbance (e.d.s., snoring, restless leg) and dysphoric mood.    Objective:  Neurologic Exam  Physical Exam Physical Examination:   Filed Vitals:   11/13/13 0839  BP: 130/82  Pulse: 68  Temp: 97.4 F (36.3 C)    General Examination: The patient is a very pleasant 58 y.o. female in no acute distress. She appears well-developed and well-nourished and well groomed.   HEENT: Normocephalic, atraumatic, pupils are equal, round and reactive to light and accommodation. Funduscopic exam is  normal with sharp disc margins noted. Extraocular tracking is good without limitation to gaze excursion or nystagmus noted. Normal smooth pursuit is noted. Hearing is grossly intact. Tympanic membranes are clear bilaterally. Face is symmetric with normal facial animation and normal facial sensation. Speech is clear with no dysarthria noted. There is no hypophonia. There is no lip, neck/head, jaw or voice tremor. Neck is supple with full range of passive and active motion. There are no carotid bruits on auscultation. Oropharynx exam reveals: mild mouth dryness, adequate dental hygiene with upper braces in place and moderate airway crowding, due to larger and elongated uvula and narrow airway entry. Mallampati is class II. Tongue protrudes centrally and palate elevates symmetrically. Tonsils are 1+ in size. Neck size is 14.5 inches. She has a tiny overbite. Nasal inspection reveals no significant nasal mucosal bogginess or redness and no septal deviation.   Chest: Clear to auscultation without wheezing, rhonchi or crackles noted.  Heart: S1+S2+0, regular and normal without murmurs, rubs or gallops noted.   Abdomen: Soft, non-tender and non-distended with normal bowel sounds appreciated on auscultation.  Extremities: There is no pitting edema in the distal lower extremities bilaterally. Pedal pulses are intact.  Skin: Warm and dry without trophic changes noted. There are no varicose veins.  Musculoskeletal: exam reveals no obvious joint deformities, tenderness or joint swelling or erythema.   Neurologically:  Mental status: The patient is awake, alert and oriented in all 4 spheres. Her immediate and remote memory, attention, language skills and fund of knowledge are appropriate. There is no evidence of aphasia, agnosia, apraxia or anomia. Speech is clear with normal prosody and enunciation. Thought process is linear. Mood is normal and affect is normal.  Cranial nerves II - XII are as described above  under HEENT exam. In addition: shoulder shrug is normal with equal shoulder height noted. Motor exam: Normal bulk, strength and tone is noted. There is no drift, tremor or rebound. Romberg shows slight swaying. Reflexes are 2 to 3+ throughout. Babinski: Toes are flexor bilaterally. Fine motor skills and coordination: intact with normal finger taps, normal hand movements, normal rapid alternating patting, normal foot taps and normal foot agility.  Cerebellar testing: No dysmetria or intention tremor on finger to nose testing. Heel to shin is unremarkable bilaterally. There is no truncal or gait ataxia.  Sensory exam: intact to light touch, pinprick, vibration, temperature sense and proprioception in the upper and lower extremities.  Gait, station and balance: She stands easily. No veering to one side is noted. No leaning to one side is noted. Posture is age-appropriate and stance is narrow based. Gait shows normal stride length and normal pace. No problems turning are noted. She turns en bloc. Tandem walk is unremarkable. Intact toe and heel stance is noted.               Assessment and Plan:  In summary, Dorothy [name] is a very pleasant 58 y.o.-year old female with a history and physical exam concerning for obstructive sleep apnea (OSA). She reports snoring, daytime tiredness with a need for prolonged nap daily, nonrestorative sleep. In addition, she reports short-term memory issues. She also endorses RLS symptoms as well as periodic leg movements of sleep. I had a long chat with the patient about my findings and the diagnosis of OSA and RLS, the prognosis and treatment options. We talked about medical treatments and non-pharmacological approaches. I explained in particular the risks and ramifications of untreated moderate to severe OSA, especially with respect  to developing cardiovascular disease down the Road, including congestive heart failure, difficult to treat hypertension, cardiac arrhythmias, or  stroke. Even type 2 diabetes has in part been linked to untreated OSA. We talked about trying to maintain a healthy lifestyle in general, as well as the importance of weight control. I encouraged the patient to eat healthy, exercise daily and keep well hydrated, to keep a scheduled bedtime and wake time routine, to not skip any meals and eat healthy snacks in between meals.  I recommended the following at this time: sleep study with potential positive airway pressure titration.  I explained the sleep test procedure to the patient and also outlined possible surgical and non-surgical treatment options of OSA, including the use of a custom-made dental device, upper airway surgical options, such as pillar implants, radiofrequency surgery, tongue base surgery, and UPPP. I also explained the CPAP treatment option to the patient, who indicated that she would be willing to try CPAP if the need arises. I explained the importance of being compliant with PAP treatment, not only for insurance purposes but primarily to improve Her symptoms, and for the patient's long term health benefit, including to reduce Her cardiovascular risks. I answered all her questions today and the patient was in agreement. I would like to see her back after the sleep study is completed and encouraged her to call with any interim questions, concerns, problems or updates.   Thank you very much for allowing me to participate in the care of this nice patient. If I can be of any further assistance to you please do not hesitate to call me at [phone].  Sincerely,   Star Age, MD, PhD

## 2013-11-19 ENCOUNTER — Other Ambulatory Visit: Payer: Self-pay | Admitting: Family Medicine

## 2013-11-19 ENCOUNTER — Ambulatory Visit
Admission: RE | Admit: 2013-11-19 | Discharge: 2013-11-19 | Disposition: A | Discharge status: Home / Self Care | Payer: No Typology Code available for payment source | Source: Ambulatory Visit | Attending: Family Medicine | Admitting: Family Medicine

## 2013-11-19 DIAGNOSIS — R5383 Other fatigue: Secondary | ICD-10-CM

## 2013-11-19 DIAGNOSIS — R0602 Shortness of breath: Secondary | ICD-10-CM

## 2013-11-19 DIAGNOSIS — R5381 Other malaise: Secondary | ICD-10-CM

## 2013-11-19 MED ORDER — IOHEXOL 350 MG/ML SOLN
100.0000 mL | Freq: Once | INTRAVENOUS | Status: AC | PRN
Start: 2013-11-19 — End: 2013-11-19
  Administered 2013-11-19: 100 mL via INTRAVENOUS

## 2013-11-20 ENCOUNTER — Other Ambulatory Visit: Payer: Self-pay | Admitting: Internal Medicine

## 2013-12-06 ENCOUNTER — Other Ambulatory Visit: Payer: Self-pay | Admitting: Family Medicine

## 2013-12-10 ENCOUNTER — Ambulatory Visit (INDEPENDENT_AMBULATORY_CARE_PROVIDER_SITE_OTHER): Payer: 59 | Admitting: Cardiovascular Disease

## 2013-12-10 ENCOUNTER — Encounter: Payer: Self-pay | Admitting: Cardiovascular Disease

## 2013-12-10 ENCOUNTER — Institutional Professional Consult (permissible substitution): Payer: 59 | Admitting: Cardiovascular Disease

## 2013-12-10 VITALS — BP 114/81 | HR 80 | Ht 65.0 in | Wt 171.0 lb

## 2013-12-10 DIAGNOSIS — I493 Ventricular premature depolarization: Secondary | ICD-10-CM

## 2013-12-10 DIAGNOSIS — I4949 Other premature depolarization: Secondary | ICD-10-CM

## 2013-12-10 DIAGNOSIS — R0789 Other chest pain: Secondary | ICD-10-CM

## 2013-12-10 NOTE — Progress Notes (Signed)
Dorothy [name] Date of Birth  1956/03/18       [facility] Office [address], [address], [place], [place]  [address]   [place], [place]  [address] [place]   Fax  [phone]     Fax [phone]  Problem List: 1. Palpitations 2. Diabetes mellitus 3. Depression 4. Hyperlipidemia   History of Present Illness:  Dorothy [name] has had some DOE.  Has been going on for several months.  Occasionally she has a dull ache - up through the chest .  Not associated with exercise or change of position. She thinks that it may be associated with eating.  She walks on a regular basis.  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   She has no chest pain with exertion.  She has trouble sleeping - no trouble getting to sleep but then has difficulty in staying asleep.    She takes a 4 hour nap every afternoon.     She has some occasional palpitations that are entirely c/w PVCs. ( single isolated palps, runs up there throat, brief sudden cough)   ECG at [facility] was normal. CT of the chest was normal.  + family hx of CAD.  Non smoker,  Occasional ETOH.   Current Outpatient Prescriptions on File Prior to Visit  Medication Sig Dispense Refill  . Calcium Carb-Cholecalciferol (CALCIUM 500 +D) 500-400 MG-UNIT TABS Take 1 tablet by mouth daily.       . Cholecalciferol (VITAMIN D3) 2000 UNITS TABS Take 2,000 Units by mouth daily.       . Cyanocobalamin (VITAMIN B 12 PO) Take 1,500 mg by mouth daily.      [name] esomeprazole (NEXIUM) 40 MG capsule Take 40 mg by mouth every evening.      . estrogens, conjugated, (PREMARIN) 0.625 MG tablet Take 1 tablet (0.625 mg total) by mouth daily. Take daily for 21 days then do not take for 7 days.  30 tablet  5  . fluticasone (FLONASE) 50 MCG/ACT nasal spray Place 2 sprays into the nose daily.      [name] loperamide (IMODIUM) 2 MG capsule Take 2 mg by  mouth daily.      [name] loperamide (IMODIUM) 2 MG capsule TAKE 1 CAPSULE BY MOUTH 2 TIMES DAILY AS NEEDED FOR DIARRHEA OR LOOSE STOOLS.  60 capsule  3  . metFORMIN (GLUCOPHAGE) 1000 MG tablet TAKE 1 TABLET BY MOUTH 2 TIMES DAILY WITH A MEAL.  60 tablet  5  . omega-3 acid ethyl esters (LOVAZA) 1 G capsule Take 1 g by mouth 2 (two) times daily.      [name] DELICA LANCETS 03K MISC USE TO TEST BLOOD SUGAR DAILY  100 each  3  . pregabalin (LYRICA) 50 MG capsule Take 50 mg by mouth 2 (two) times daily.      . simvastatin (ZOCOR) 40 MG tablet Take 40 mg by mouth every evening.      . simvastatin (ZOCOR) 40 MG tablet TAKE 1 TABLET AT BEDTIME FOR CHOLESTEROL  30 tablet  5  . triamterene-hydrochlorothiazide (DYAZIDE) 37.5-25 MG per capsule Take 2 capsules by mouth every morning.      . venlafaxine XR (EFFEXOR-XR) 75 MG 24 hr capsule Take 75 mg by mouth every evening.       No current facility-administered medications on file prior to visit.  Allergies  Allergen Reactions  . Adhesive [Tape] Other (See Comments)    Tears skins  . Bactrim [Sulfamethoxazole-Tmp Ds] Other (See Comments)    blisters  . Codeine Itching  . Keflex [Cephalexin]   . Macrobid [Nitrofurantoin Macrocrystal] Other (See Comments)    blisters  . Nitrofurantoin Nausea And Vomiting and Other (See Comments)    Blisters   . Pentazocine Lactate     Pt not sure  . Phenazopyridine     Pt not sure  . Sulfa Drugs Cross Reactors Itching  . Sulfamethoxazole-Trimethoprim Itching  . Talwin [Pentazocine] Nausea And Vomiting and Rash    Muscle cramps and vision changes    Past Medical History  Diagnosis Date  . Fatty liver   . GERD (gastroesophageal reflux disease)   . Gastritis   . Hyperlipidemia   . Depression   . Anxiety   . Choledocholithiasis   . Hx of adenomatous colonic polyps   . Diabetes mellitus   . Migraine   . Osteoarthritis   . Peptic ulcer, unspecified site, unspecified as acute or chronic, without mention of  hemorrhage, perforation, or obstruction   . Otosclerosis, unspecified   . B12 deficiency   . Hematuria, unspecified   . Unspecified disorder of kidney and ureter   . Osteoporosis, unspecified   . Meniere's disease, unspecified   . Benign paroxysmal positional vertigo   . Symptomatic menopausal or female climacteric states   . Allergic rhinitis, cause unspecified   . Fatty liver 09/04/12  . Hiatal hernia   . Fibromyalgia   . Mononeuritis of unspecified site     Past Surgical History  Procedure Laterality Date  . Cholecystectomy    . Tubal ligation    . Shoulder surgery      left  . Breast enhancement surgery Bilateral   . Foot surgery      right  . Mouth surgery    . Bile duct exploration      gallstone removed  . Total abdominal hysterectomy      ovaries intact  . Maxillary le forte i osteotomy Bilateral 06/19/2013    Procedure: BILATERAL SAGITTAL SPLIT OSTEOMY WITH RIGID FIXATION;  Surgeon: Michaela Corner, DDS;  Location: [facility];  Service: Oral Surgery;  Laterality: Bilateral;    History  Smoking status  . Never Smoker   Smokeless tobacco  . Never Used    History  Alcohol Use  . Yes    Comment: occasional 1 x month    Family History  Problem Relation Age of Onset  . Colon cancer Cousin     died age 75  . Prostate cancer Father   . Lung disease Father   . Diabetes Father   . Heart disease Father 40    CHF, AMI multiple/CABG  . COPD Father   . Cancer Father     prostate cancer  . Breast cancer Maternal Aunt   . Diabetes Mother   . Hypertension Mother   . Hyperlipidemia Mother   . COPD Mother   . Cancer Mother     lung cancer  . Diabetes Brother   . Hypertension Brother   . Obesity Brother   . Stroke Maternal Grandmother   . Heart disease Maternal Grandfather   . COPD Paternal Grandmother   . Diabetes Paternal Grandmother   . COPD Paternal Grandfather     Reviw of Systems:  Reviewed in the HPI.  All other systems are negative.  Physical  Exam: Blood pressure 114/81,  pulse 80, height 5\' 5"  (1.651 m), weight 171 lb (77.565 kg). Wt Readings from Last 3 Encounters:  12/10/13 171 lb (77.565 kg)  11/13/13 170 lb (77.111 kg)  10/27/13 165 lb (74.844 kg)     General: Well developed, well nourished, in no acute distress.  Head: Normocephalic, atraumatic, sclera non-icteric, mucus membranes are moist,   Neck: Supple. Carotids are 2 + without bruits. No JVD   Lungs: Clear   Heart: Rr, , normal S1S2  Abdomen: Soft, non-tender, non-distended with normal bowel sounds.  Msk:  Strength and tone are normal   Extremities: No clubbing or cyanosis. No edema.  Distal pedal pulses are 2+ and equal    Neuro: CN II - XII intact.  Alert and oriented X 3.   Psych:  Normal   ECG: October 28, 2013:  NSR , no ST or T wave changes.  Assessment / Plan:

## 2013-12-10 NOTE — Patient Instructions (Addendum)
Add potassium chloride to your diet. You can find is salt substitute next the salt in the standard grocery store. Additional potassium will help reduce your premature ventricular contractions.    Try to get a more regular sleep schedule.  Increase her exercise. I would like to get off for about 1 hour a day. This should be about 3 miles. Start about half a mile a day and increase the distance every 5-7 days. Call us back if you have any episodes of chest pain with exertion. As suspected to be feeling much better after starting a regular exercise program. Regular  exercise will also help with your  Sleep.    Your physician recommends that you schedule a follow-up appointment in: as needed basis

## 2013-12-10 NOTE — Assessment & Plan Note (Signed)
She presents with palpitations that are almost certainly premature ventricular contractions. He's a single isolated palpitations that run up into her throat culture to cough recently. I suspect that these are due to her hypokalemia, lack of sleep, or increased stress.  Her TSH was normal in May of 2014. I've recommended that she increase the potassium in her diet.

## 2013-12-10 NOTE — Assessment & Plan Note (Addendum)
Chest discomfort sounds atypical. It is not associated with exertion it seems to be more associated with eating or drinking. Is not caused by exercise.  Her EKG is entirely normal.  I've recommended that she start an exercise program. If she is able to advance in  her exercise program and I think rthe odds of her  having significant coronary disease are very low.

## 2013-12-17 ENCOUNTER — Ambulatory Visit (INDEPENDENT_AMBULATORY_CARE_PROVIDER_SITE_OTHER): Payer: 59

## 2013-12-17 DIAGNOSIS — G4761 Periodic limb movement disorder: Secondary | ICD-10-CM

## 2013-12-17 DIAGNOSIS — G2581 Restless legs syndrome: Secondary | ICD-10-CM

## 2013-12-17 DIAGNOSIS — G479 Sleep disorder, unspecified: Secondary | ICD-10-CM

## 2013-12-17 DIAGNOSIS — G4733 Obstructive sleep apnea (adult) (pediatric): Secondary | ICD-10-CM

## 2013-12-17 DIAGNOSIS — R0683 Snoring: Secondary | ICD-10-CM

## 2013-12-17 DIAGNOSIS — R0602 Shortness of breath: Secondary | ICD-10-CM

## 2013-12-17 DIAGNOSIS — G478 Other sleep disorders: Secondary | ICD-10-CM

## 2013-12-24 ENCOUNTER — Other Ambulatory Visit: Payer: Self-pay | Admitting: Family Medicine

## 2013-12-24 NOTE — Telephone Encounter (Signed)
Dr Tamala Julian, you saw pt in March but don't see Premarin addressed. Do you want to RF?

## 2013-12-26 HISTORY — PX: BUNIONECTOMY: SHX129

## 2014-01-02 ENCOUNTER — Telehealth: Payer: Self-pay | Admitting: Neurology

## 2014-01-02 NOTE — Telephone Encounter (Signed)
Please advise patient had her sleep study did not show any significant sleep apnea but she did have significant periodic leg movements of sleep which can be seen in patients with restless leg syndrome. She had significant sleep disruption from her leg movements and I would like to go for the test results with her in detail during her followup appointment and where to go from here. pls arrange appt.

## 2014-01-05 ENCOUNTER — Encounter: Payer: Self-pay | Admitting: *Deleted

## 2014-01-05 NOTE — Telephone Encounter (Signed)
I called and spoke with the patient about her recent sleep study results. I informed the patient that the study did not show any significant sleep apnea but she did have periodic leg movements of sleep which can be seen in patients with restless leg syndrome. I also informed the patient that Dr. Rexene Alberts would like to discuss the sleep study results in detail. Patient has been scheduled for May 15,2015 at 9:00 am with arrival time of 8:45 am. I will fax a copy of the report to Dr. Steffanie Dunn Smith's office and mail a copy to the patient.

## 2014-01-09 ENCOUNTER — Ambulatory Visit (INDEPENDENT_AMBULATORY_CARE_PROVIDER_SITE_OTHER): Payer: 59 | Admitting: Neurology

## 2014-01-09 ENCOUNTER — Encounter: Payer: Self-pay | Admitting: Neurology

## 2014-01-09 ENCOUNTER — Encounter (INDEPENDENT_AMBULATORY_CARE_PROVIDER_SITE_OTHER): Payer: Self-pay

## 2014-01-09 VITALS — BP 119/77 | HR 64 | Temp 97.0°F | Ht 65.0 in | Wt 169.0 lb

## 2014-01-09 DIAGNOSIS — G478 Other sleep disorders: Secondary | ICD-10-CM

## 2014-01-09 DIAGNOSIS — G4761 Periodic limb movement disorder: Secondary | ICD-10-CM

## 2014-01-09 DIAGNOSIS — E663 Overweight: Secondary | ICD-10-CM

## 2014-01-09 DIAGNOSIS — G479 Sleep disorder, unspecified: Secondary | ICD-10-CM

## 2014-01-09 DIAGNOSIS — G2581 Restless legs syndrome: Secondary | ICD-10-CM

## 2014-01-09 MED ORDER — ROTIGOTINE 1 MG/24HR TD PT24: 1.0000 mg | MEDICATED_PATCH | Freq: Every day | TRANSDERMAL | Status: DC

## 2014-01-09 MED ORDER — ROTIGOTINE 2 MG/24HR TD PT24: 1.0000 | MEDICATED_PATCH | Freq: Every day | TRANSDERMAL | Status: DC

## 2014-01-09 NOTE — Patient Instructions (Signed)
We will start treatment for your restless legs and your severe leg kicking in sleep.  Try to switch your Effexor XR 75 mg to once daily in the MORNING.  We will start Neupro: (rotigotine patch) 1 mg/24 hour: Use 1 patch each night for 1 month (or just 2 weeks if you wish: you have a 30 day free Rx), then go to the 2 mg patch for the next months - co pat card provided. Common side effects reported are: Sedation, sleepiness, nausea, rare vomiting, and rare side effects are: confusion, hallucinations, swelling in legs, and abnormal behaviors, including impulse control problems (which can manifest as excessive eating, obsessions with food or gambling, or hypersexuality).  I will also check iron studies in your blood today and we will call you back. If you ferritin is less than 50, you will be asked to take an OTC iron, but we will let you know.

## 2014-01-09 NOTE — Progress Notes (Signed)
Subjective:    [name] ID: Dorothy [name] is a 58 y.o. female.  HPI    Interim history:    Dorothy [name] is a 58 year old right-handed woman with an underlying complex medical history of diabetes, hyperlipidemia, depression, fatty liver, reflux disease, anxiety, gallstones, migraine headaches, osteoarthritis, peptic ulcer disease, vitamin B12 deficiency, Mnire's disease and benign positional vertigo, allergic rhinitis, hiatal hernia and fibromyalgia, who presents for followup consultation of her sleep disturbance, including restless leg symptoms. She is unaccompanied today. I first met her on 11/13/2013 at Dorothy request of her primary care physician, at which time she reported intermittent shortness of breath, palpitations, indigestion, and excessive daytime somnolence, with a need for daily nap after work between 2 and 6 PM. She also reported restless leg symptoms and jerking in her sleep. She reported having had a sleep study many years ago which she recalls showed snoring. 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 She has braces on top, which may come out soon. 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 She is a non-smoker and drinks alcohol very occasionally and does not drink caffeine. She lost about 15 lb from Dorothy jaw surgery, but has mostly gained it back. I suggested she return for sleep study. She she had a baseline sleep study on 12/17/2013 and I went over her test results with her in detail today. Sleep efficiency was reduced at 62.9% latency to sleep of 73 minutes and wake after sleep onset of 77 minutes with moderate to severe sleep fragmentation noted. She had an elevated arousal index of 28.5 per hour primarily because of periodic leg movements. She had a normal percentage of stage I sleep, a markedly increased percentage of stage II sleep, and absence of slow-wave and REM sleep.  She had severe periodic leg movements of sleep. Index was 76.9 per hour with an associated arousal index of 21.5 per hour. She had no significant EKG changes. Minimal intermittent snoring was noted. She slept mostly in Dorothy lateral positions. She had a total AHI of 4 per hour, rising to 25.4 per hour in Dorothy supine position. Baseline oxygen saturation was 95%, nadir was 90%. I felt that Dorothy absence of REM sleep and her estimated her potential underlying sleep disorder breathing. I felt that her Effexor may be a contributor to her restless leg symptoms and PLMS. Today, she reports significant RLS symptoms and endorses difficulty with sleep onset and sleep maintenance. This has been a long-standing problem for her. She recently had cardiac workup and other than PVCs she has no cardiac abnormalities.   Her Past Medical History Is Significant For: Past Medical History  Diagnosis Date  . Fatty liver   . GERD (gastroesophageal reflux disease)   . Gastritis   . Hyperlipidemia   . Depression   . Anxiety   . Choledocholithiasis   . Hx of adenomatous colonic polyps   . Diabetes mellitus   . Migraine   . Osteoarthritis   . Peptic ulcer, unspecified site, unspecified as acute or chronic, without mention of hemorrhage, perforation, or obstruction   . Otosclerosis, unspecified   . B12 deficiency   . Hematuria, unspecified   . Unspecified disorder of kidney and ureter   . Osteoporosis, unspecified   . Meniere's disease, unspecified   . Benign paroxysmal positional vertigo   . Symptomatic menopausal or female climacteric states   . Allergic rhinitis, cause  unspecified   . Fatty liver 09/04/12  . Hiatal hernia   . Fibromyalgia   . Mononeuritis of unspecified site     Her Past Surgical History Is Significant For: Past Surgical History  Procedure Laterality Date  . Cholecystectomy    . Tubal ligation    . Shoulder surgery      left  . Breast enhancement surgery Bilateral   . Foot surgery       right  . Mouth surgery    . Bile duct exploration      gallstone removed  . Total abdominal hysterectomy      ovaries intact  . 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 Colon cancer Cousin     died age 1  . Prostate cancer Father   . Lung disease Father   . Diabetes Father   . Heart disease Father 46    CHF, AMI multiple/CABG  . COPD Father   . Cancer Father     prostate cancer  . Breast cancer Maternal Aunt   . Diabetes Mother   . Hypertension Mother   . Hyperlipidemia Mother   . COPD Mother   . Cancer Mother     lung cancer  . Diabetes Brother   . Hypertension Brother   . Obesity Brother   . Stroke Maternal Grandmother   . Heart disease Maternal Grandfather   . COPD Paternal Grandmother   . Diabetes Paternal Grandmother   . COPD Paternal Grandfather     Her Social History Is Significant For: History   Social History  . Marital Status: Married    Spouse Name: Richardson Landry    Number of Children: 3  . 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 Smoking status: Never Smoker   . Smokeless tobacco: Never Used  . Alcohol Use: Yes     Comment: occasional 1 x month  . Drug Use: No  . Sexual Activity: Yes   Other Topics Concern  . None   Social History Narrative   Always uses seat belts. Smoke alarm and carbon monoxide detector in Dorothy home.No caffeine use. No unsecured guns in Dorothy home.    Marital status:  Married x 17 years;Happily,no abuse. 2nd marriage.      Children: 3 sons (93, 32, 76); 3 grandchildren; 3 step grandchildren; 1 gg.      Lives: with husband.      Employment:  Working 15-20 hours per week at [employer]; loves job.      Tobacco:       Alcohol:       Drugs:      Exercise: Inactive/none.    Her Allergies Are:  Allergies  Allergen Reactions  . Adhesive [Tape] Other (See Comments)    Tears skins  . Bactrim [Sulfamethoxazole-Tmp Ds] Other (See Comments)    blisters  . Codeine Itching  . Keflex [Cephalexin]   . Macrobid [Nitrofurantoin Macrocrystal] Other (See Comments)    blisters  . Nitrofurantoin Nausea And Vomiting and Other (See Comments)    Blisters   . Pentazocine Lactate     Pt not sure  .  Phenazopyridine     Pt not sure  . Sulfa Drugs Cross Reactors Itching  . Sulfamethoxazole-Trimethoprim Itching  . Talwin [Pentazocine] Nausea And Vomiting and Rash    Muscle cramps and vision changes  :   Her Current Medications Are:  Outpatient Encounter Prescriptions as of 01/09/2014  Medication Sig  . Calcium Carb-Cholecalciferol (CALCIUM 500 +D) 500-400 MG-UNIT TABS Take 1 tablet by mouth daily.   . Cholecalciferol (VITAMIN D3) 2000 UNITS TABS Take 2,000 Units by mouth daily.   . Cyanocobalamin (VITAMIN B 12 PO) Take 1,500 mg by mouth daily.  [name] esomeprazole (NEXIUM) 40 MG capsule Take 40 mg by mouth every evening.  . estrogens, conjugated, (PREMARIN) 0.625 MG tablet Take 1 tablet (0.625 mg total) by mouth daily.  . fluticasone (FLONASE) 50 MCG/ACT nasal spray Place 2 sprays into Dorothy nose daily.  [name] loperamide (IMODIUM) 2 MG capsule Take 2 mg by mouth daily.  . metFORMIN (GLUCOPHAGE) 1000 MG tablet TAKE 1 TABLET BY MOUTH 2 TIMES DAILY WITH A MEAL.  [name] omega-3 acid ethyl esters (LOVAZA) 1 G capsule Take 1 g by mouth 2 (two) times daily.  [name] DELICA LANCETS 82N MISC USE TO TEST BLOOD SUGAR DAILY  . pregabalin (LYRICA) 50 MG capsule Take 50 mg by mouth 2 (two) times daily.  . simvastatin (ZOCOR) 40 MG tablet Take 40 mg by mouth every evening.  . simvastatin (ZOCOR) 40 MG tablet TAKE 1 TABLET AT BEDTIME FOR CHOLESTEROL  . triamterene-hydrochlorothiazide (DYAZIDE) 37.5-25  MG per capsule Take 2 capsules by mouth every morning.  . venlafaxine XR (EFFEXOR-XR) 75 MG 24 hr capsule Take 75 mg by mouth every evening.  . [DISCONTINUED] loperamide (IMODIUM) 2 MG capsule TAKE 1 CAPSULE BY MOUTH 2 TIMES DAILY AS NEEDED FOR DIARRHEA OR LOOSE STOOLS.  :  Review of Systems:  Out of a complete 14 point review of systems, all are reviewed and negative with Dorothy exception of these symptoms as listed below:  Review of Systems  Constitutional: Positive for fatigue.  HENT:       Ringing in ears  Respiratory: Positive for shortness of breath.   Cardiovascular: Positive for palpitations.  Genitourinary:       Diarrhea  Musculoskeletal: Positive for back pain, neck pain and neck stiffness.       Joint pain,aching muscles  Allergic/Immunologic: Positive for environmental allergies.  Neurological: Positive for dizziness.  Psychiatric/Behavioral:       Depression    Objective:  Neurologic Exam  Physical Exam Physical Examination:   Filed Vitals:   01/09/14 0903  BP: 119/77  Pulse: 64  Temp: 97 F (36.1 C)     General Examination: Dorothy [name] is a very pleasant 58 y.o. female in no acute distress. She appears well-developed and well-nourished and well groomed.   HEENT: Normocephalic, atraumatic, pupils are equal, round and reactive to light and accommodation. Funduscopic exam is normal with sharp disc margins noted. Extraocular tracking is good without limitation to gaze excursion or nystagmus noted. Normal smooth pursuit is noted. Hearing is grossly intact. Face is symmetric with normal facial animation and normal facial sensation. Speech is clear with no dysarthria noted. There is no hypophonia. There is no lip, neck/head, jaw or voice tremor. Neck is supple with full range of passive and active motion. There are no carotid bruits on auscultation. Oropharynx exam reveals: mild mouth dryness, adequate dental hygiene with upper braces in place and moderate airway  crowding, due to larger and elongated uvula and narrow  airway entry. Mallampati is class II. Tongue protrudes centrally and palate elevates symmetrically. Tonsils are 1+ in size. Neck size is 14.5 inches. She has a tiny overbite.  Chest: Clear to auscultation without wheezing, rhonchi or crackles noted.  Heart: S1+S2+0, regular and normal without murmurs, rubs or gallops noted.   Abdomen: Soft, non-tender and non-distended with normal bowel sounds appreciated on auscultation.  Extremities: There is no pitting edema in Dorothy distal lower extremities bilaterally. Pedal pulses are intact.  Skin: Warm and dry without trophic changes noted. There are no varicose veins.  Musculoskeletal: exam reveals no obvious joint deformities, tenderness or joint swelling or erythema.   Neurologically:  Mental status: Dorothy [name] is awake, alert and oriented in all 4 spheres. Her immediate and remote memory, attention, language skills and fund of knowledge are appropriate. There is no evidence of aphasia, agnosia, apraxia or anomia. Speech is clear with normal prosody and enunciation. Thought process is linear. Mood is normal and affect is normal.  Cranial nerves II - XII are as described above under HEENT exam. In addition: shoulder shrug is normal with equal shoulder height noted. Motor exam: Normal bulk, strength and tone is noted. There is no drift, tremor or rebound. Romberg shows slight swaying. Reflexes are 2 to 3+ throughout. Babinski: Toes are flexor bilaterally. Fine motor skills and coordination: intact with normal finger taps, normal hand movements, normal rapid alternating patting, normal foot taps and normal foot agility.  Cerebellar testing: No dysmetria or intention tremor on finger to nose testing. Heel to shin is unremarkable bilaterally. There is no truncal or gait ataxia.  Sensory exam: intact to light touch, pinprick, vibration, temperature sense in Dorothy upper and lower extremities.  Gait, station  and balance: She stands easily. No veering to one side is noted. No leaning to one side is noted. Posture is age-appropriate and stance is narrow based. Gait shows normal stride length and normal pace. No problems turning are noted. She turns en bloc. Tandem walk is unremarkable.           Assessment and Plan:   In summary, Dorothy [name] is a very pleasant 58 year old female with an underlying complex medical history of diabetes, hyperlipidemia, depression, fatty liver, reflux disease, anxiety, gallstones, migraine headaches, osteoarthritis, peptic ulcer disease, vitamin B12 deficiency, Mnire's disease and benign positional vertigo, allergic rhinitis, hiatal hernia and fibromyalgia, who presents for followup consultation of her sleep disturbance, particularly her restless leg symptoms, after her recent sleep study. I talked to her at length about her sleep study results. Unfortunately she has poor sleep efficiency and poor sleep consolidation, achieved no deep sleep and no REM sleep. Thankfully she does not have much in Dorothy way of sleep disordered breathing other than when she sleeps on her back. For this I have advised her to try to lose weight and stay off her back when she sleeps. Of course, this is in Dorothy absence of REM sleep and Dorothy study which may mean that her sleep disorder breathing make it worse when she is in REM sleep. Dorothy same is true for desaturation nadir. She had significant periodic leg movements in sleep and significant associated arousals which are likely a major cause for sleep disturbance and poor sleep consolidation. She did not achieve any REM sleep which may be a medication effect from Dorothy Effexor. I also advised her that medications like Effexor can increase Dorothy likelihood for periodic leg movements and symptoms of restless leg syndrome. Nevertheless, if she is  stable on her antidepressant there is not necessarily a need to change her antidepressant, in particular, as most other  antidepressants also fallen Dorothy SSRI category or SNRI category and would also be likely to cause similar symptoms or PLMS. At this juncture, I would like to check her iron studies. She recently had a CBC, which was fine. I have asked her to start Neupro patch, 1 mg once daily. I talked to her about potential side effects and provided her with a 30 a free prescription. After one month or if she wishes after 2 weeks she can increase it to Dorothy neck strength, namely 2 mg once daily. I provided her with a separate prescription for that as well. If her ferritin comes back less than 50 I would also like for her to start an over-Dorothy-counter iron supplements. We will call her with Dorothy test results and let her know. In addition, would like for her to try take her Effexor in Dorothy morning as posterior evening but it makes her sleepy she's going to have to switch back. I will see her back in 3-4 months, sooner if Dorothy need arises. We will be in touch over Dorothy phone regarding her blood test results. I answered all her questions and she was in agreement with Dorothy plan.

## 2014-01-10 LAB — IRON AND TIBC
IRON SATURATION: 12 % — AB (ref 15–55)
IRON: 64 ug/dL (ref 35–155)
TIBC: 519 ug/dL — ABNORMAL HIGH (ref 250–450)
UIBC: 455 ug/dL — AB (ref 150–375)

## 2014-01-10 LAB — FERRITIN: Ferritin: 10 ng/mL — ABNORMAL LOW (ref 15–150)

## 2014-01-11 NOTE — Progress Notes (Signed)
Quick Note:  Please call patient and advise her that her that her storage iron was low. This can contribute to symptoms of restless leg syndrome as discussed during our recent clinic visit. I would like for her to start an over-the-counter iron supplement. Please have her follow the directions on the are and supplement. Please ask her to take it with a little bit of orange juice to allow better absorption. We will recheck lab tests probably in about 3 months. Star Age, MD, PhD [facility] ([facility]  ______

## 2014-01-12 ENCOUNTER — Other Ambulatory Visit: Payer: Self-pay | Admitting: Physician Assistant

## 2014-01-12 ENCOUNTER — Encounter: Payer: Self-pay | Admitting: Family Medicine

## 2014-01-12 ENCOUNTER — Ambulatory Visit (INDEPENDENT_AMBULATORY_CARE_PROVIDER_SITE_OTHER): Payer: 59 | Admitting: Family Medicine

## 2014-01-12 ENCOUNTER — Other Ambulatory Visit: Payer: Self-pay | Admitting: Family Medicine

## 2014-01-12 VITALS — BP 116/78 | HR 57 | Temp 98.1°F | Resp 16 | Ht 64.75 in | Wt 173.0 lb

## 2014-01-12 DIAGNOSIS — Z0000 Encounter for general adult medical examination without abnormal findings: Secondary | ICD-10-CM

## 2014-01-12 DIAGNOSIS — E7800 Pure hypercholesterolemia, unspecified: Secondary | ICD-10-CM

## 2014-01-12 DIAGNOSIS — E119 Type 2 diabetes mellitus without complications: Secondary | ICD-10-CM

## 2014-01-12 LAB — CBC WITH DIFFERENTIAL/PLATELET
BASOS ABS: 0 10*3/uL (ref 0.0–0.1)
BASOS PCT: 0 % (ref 0–1)
EOS ABS: 0.1 10*3/uL (ref 0.0–0.7)
Eosinophils Relative: 2 % (ref 0–5)
HCT: 34.8 % — ABNORMAL LOW (ref 36.0–46.0)
HEMOGLOBIN: 11.5 g/dL — AB (ref 12.0–15.0)
Lymphocytes Relative: 34 % (ref 12–46)
Lymphs Abs: 1.6 10*3/uL (ref 0.7–4.0)
MCH: 28.5 pg (ref 26.0–34.0)
MCHC: 33 g/dL (ref 30.0–36.0)
MCV: 86.4 fL (ref 78.0–100.0)
Monocytes Absolute: 0.4 10*3/uL (ref 0.1–1.0)
Monocytes Relative: 8 % (ref 3–12)
NEUTROS PCT: 56 % (ref 43–77)
Neutro Abs: 2.6 10*3/uL (ref 1.7–7.7)
Platelets: 221 10*3/uL (ref 150–400)
RBC: 4.03 MIL/uL (ref 3.87–5.11)
RDW: 15.1 % (ref 11.5–15.5)
WBC: 4.6 10*3/uL (ref 4.0–10.5)

## 2014-01-12 LAB — LIPID PANEL
CHOLESTEROL: 177 mg/dL (ref 0–200)
HDL: 76 mg/dL (ref >39)
LDL CALC: 72 mg/dL (ref 0–99)
TRIGLYCERIDES: 144 mg/dL (ref <150)
Total CHOL/HDL Ratio: 2.3 Ratio
VLDL: 29 mg/dL (ref 0–40)

## 2014-01-12 LAB — POCT URINALYSIS DIPSTICK
Bilirubin, UA: NEGATIVE
Blood, UA: NEGATIVE
Glucose, UA: NEGATIVE
KETONES UA: NEGATIVE
LEUKOCYTES UA: NEGATIVE
Nitrite, UA: NEGATIVE
PH UA: 5.5
PROTEIN UA: NEGATIVE
Spec Grav, UA: 1.015
UROBILINOGEN UA: 0.2

## 2014-01-12 LAB — COMPLETE METABOLIC PANEL WITH GFR
ALK PHOS: 46 U/L (ref 39–117)
ALT: 13 U/L (ref 0–35)
AST: 15 U/L (ref 0–37)
Albumin: 4 g/dL (ref 3.5–5.2)
BILIRUBIN TOTAL: 0.5 mg/dL (ref 0.2–1.2)
BUN: 22 mg/dL (ref 6–23)
CO2: 26 mEq/L (ref 19–32)
Calcium: 9.3 mg/dL (ref 8.4–10.5)
Chloride: 102 mEq/L (ref 96–112)
Creat: 1.09 mg/dL (ref 0.50–1.10)
GFR, EST NON AFRICAN AMERICAN: 56 mL/min — AB
GFR, Est African American: 65 mL/min
Glucose, Bld: 121 mg/dL — ABNORMAL HIGH (ref 70–99)
Potassium: 3.5 mEq/L (ref 3.5–5.3)
Sodium: 139 mEq/L (ref 135–145)
Total Protein: 6.1 g/dL (ref 6.0–8.3)

## 2014-01-12 LAB — MICROALBUMIN, URINE: Microalb, Ur: 0.73 mg/dL (ref 0.00–1.89)

## 2014-01-12 LAB — TSH: TSH: 4.075 u[IU]/mL (ref 0.350–4.500)

## 2014-01-12 LAB — HEMOGLOBIN A1C
HEMOGLOBIN A1C: 7 % — AB (ref <5.7)
MEAN PLASMA GLUCOSE: 154 mg/dL — AB (ref <117)

## 2014-01-12 MED ORDER — VENLAFAXINE HCL ER 75 MG PO CP24: 75.0000 mg | ORAL_CAPSULE | Freq: Every evening | ORAL | Status: DC

## 2014-01-12 MED ORDER — ESTROGENS CONJUGATED 0.45 MG PO TABS: 0.4500 mg | ORAL_TABLET | Freq: Every day | ORAL | Status: DC

## 2014-01-12 MED ORDER — SIMVASTATIN 40 MG PO TABS: 40.0000 mg | ORAL_TABLET | Freq: Every day | ORAL | Status: DC

## 2014-01-12 MED ORDER — FLUTICASONE PROPIONATE 50 MCG/ACT NA SUSP: 2.0000 | Freq: Every day | NASAL | Status: DC

## 2014-01-12 MED ORDER — METFORMIN HCL 1000 MG PO TABS: 1000.0000 mg | ORAL_TABLET | Freq: Two times a day (BID) | ORAL | Status: DC

## 2014-01-12 MED ORDER — DEXLANSOPRAZOLE 60 MG PO CPDR: 60.0000 mg | DELAYED_RELEASE_CAPSULE | Freq: Every day | ORAL | Status: DC

## 2014-01-12 NOTE — Progress Notes (Addendum)
Subjective:    Patient ID: Dorothy [name], female    DOB: [birth year], 58 y.o.   MRN: [id number]  HPI Chief Complaint  Patient presents with   Annual Exam   This chart was scribed for [name], MD by Thea Alken, ED Scribe. This patient was seen in room 30 and the patient's care was started at 9:12 AM.  HPI Comments: Dorothy [name] is a 58 y.o. female who presents to the [facility] here regarding a complete physical. Pt had a sleep study and was diagnosed with mild obstructive sleep apnea and restless leg syndrome. Pt was c/o CP and was seen by the cardiologist. Dr. Acie Fredrickson reports pt CP was atypical and recommended an exercise program.  Pt was having palpitations caused by PVCs; no further work up recommended at cardiology.  At last visit pt was c/o SOB. Pt had CT of chest that was negative and pt was referred to cardiology. Pt GERD worse at last visit; Zantac 300mg  qhs was added with improvement of symptoms. Pt liver function test was elevated but when repeated came out normal. .  Pt reports hot flashes are subsiding. Pt reports she has stop taking Zantac. She states her GERD is not as bad.   Pt reports trouble hearing in right ear. Pt denies worsening CP and palpitation. .Pt believes SOB came from being over weight and found weighing less she had very mild SOB.  Pt is beginning a diet today. She reports smaller portion and cutting back on sweets. Pt is hoping to lose 30 lb. Pt denies cough and HA. Pt denies leg swelling, numbness and tingling in feet.  Pt no longer see massage therapist. Pt has seen optometrist 1 year ago. Pt denies glaucoma and cataracts.  Pt has h/o hysterectomy for non cancerous reasons. Pt denies vaginal pain, vaginal itch, vaginal discharge. Family h/o breast cancer. Pt reports leaking urine when excited during the day. She reports urinating once a night. Pt reports BM every day. She denies blood in stool  Pt has 1 brother who has HTN, DM and is  obese. Pt has been happily marry 18 years. She denies abuse. Pt has 3 sons and 1 lives with her. She reports her son who lives with her is 67 and on methadone. He is unable to find a job due to h/o drug abuse. Pt has 3 biological grandchildren, 3 step grandchildren and 1 great grandchild. Pt works about 25-30 hours a week. She reports she loves her job. Pt denies exercise. Pt denies guns at home.  Pt reports h/o colon cancer in family and does not like going too long without colonoscopy.   01/07/13 last physical.  2011 last colonoscopy 05/20/12-last pap smear 06/2013-  Last mammogram  2010 - TDAP 2012- last bone density  2014- Hep B 2014 influenza vaccine   Past Medical History  Diagnosis Date   Fatty liver    GERD (gastroesophageal reflux disease)    Gastritis    Hyperlipidemia    Depression    Anxiety    Choledocholithiasis    Hx of adenomatous colonic polyps    Diabetes mellitus    Migraine    Osteoarthritis    Peptic ulcer, unspecified site, unspecified as acute or chronic, without mention of hemorrhage, perforation, or obstruction    Otosclerosis, unspecified    B12 deficiency    Hematuria, unspecified    Unspecified disorder of kidney and ureter    Osteoporosis, unspecified  Meniere's disease, unspecified    Benign paroxysmal positional vertigo    Symptomatic menopausal or female climacteric states    Allergic rhinitis, cause unspecified    Fatty liver 09/04/12   Hiatal hernia    Fibromyalgia    Mononeuritis of unspecified site    Allergies  Allergen Reactions   Adhesive [Tape] Other (See Comments)    Tears skins   Bactrim [Sulfamethoxazole-Tmp Ds] Other (See Comments)    blisters   Codeine Itching   Keflex [Cephalexin]    Macrobid [Nitrofurantoin Macrocrystal] Other (See Comments)    blisters   Nitrofurantoin Nausea And Vomiting and Other (See Comments)    Blisters    Pentazocine Lactate     Pt not sure   Phenazopyridine      Pt not sure   Sulfa Drugs Cross Reactors Itching   Sulfamethoxazole-Trimethoprim Itching   Talwin [Pentazocine] Nausea And Vomiting and Rash    Muscle cramps and vision changes   Prior to Admission medications   Medication Sig Start Date End Date Taking? Authorizing Provider  Calcium Carb-Cholecalciferol (CALCIUM 500 +D) 500-400 MG-UNIT TABS Take 1 tablet by mouth daily.     Historical Provider, MD  Cholecalciferol (VITAMIN D3) 2000 UNITS TABS Take 2,000 Units by mouth daily.     Historical Provider, MD  Cyanocobalamin (VITAMIN B 12 PO) Take 1,500 mg by mouth daily.    Historical Provider, MD  esomeprazole (NEXIUM) 40 MG capsule Take 40 mg by mouth every evening.    Historical Provider, MD  estrogens, conjugated, (PREMARIN) 0.625 MG tablet Take 1 tablet (0.625 mg total) by mouth daily. 12/24/13   [name], MD  fluticasone (FLONASE) 50 MCG/ACT nasal spray Place 2 sprays into the nose daily.    Historical Provider, MD  loperamide (IMODIUM) 2 MG capsule Take 2 mg by mouth daily.    Historical Provider, MD  metFORMIN (GLUCOPHAGE) 1000 MG tablet TAKE 1 TABLET BY MOUTH 2 TIMES DAILY WITH A MEAL. 10/30/13   [name], MD  omega-3 acid ethyl esters (LOVAZA) 1 G capsule Take 1 g by mouth 2 (two) times daily.    Historical Provider, MD  [facility] DELICA LANCETS [id number] MISC USE TO TEST BLOOD SUGAR DAILY 10/30/13   [name], MD  pregabalin (LYRICA) 50 MG capsule Take 50 mg by mouth 2 (two) times daily.    Historical Provider, MD  Rotigotine (NEUPRO) 1 MG/24HR PT24 Place 1 patch (1 mg total) onto the skin daily. 01/09/14   Star Age, MD  rotigotine (NEUPRO) 2 MG/24HR Place 1 patch onto the skin daily. 01/09/14   Star Age, MD  simvastatin (ZOCOR) 40 MG tablet Take 40 mg by mouth every evening.    Historical Provider, MD  simvastatin (ZOCOR) 40 MG tablet TAKE 1 TABLET AT BEDTIME FOR CHOLESTEROL    Ryan M Dunn, PA-C  triamterene-hydrochlorothiazide (DYAZIDE) 37.5-25 MG per capsule Take 2  capsules by mouth every morning.    Historical Provider, MD  venlafaxine XR (EFFEXOR-XR) 75 MG 24 hr capsule Take 75 mg by mouth every evening.    Historical Provider, MD   Review of Systems  Constitutional: Positive for fatigue. Negative for fever, chills, diaphoresis, activity change, appetite change and unexpected weight change.  HENT: Positive for hearing loss, postnasal drip and tinnitus. Negative for congestion, dental problem, drooling, ear discharge, ear pain, facial swelling, mouth sores, nosebleeds, rhinorrhea, sinus pressure, sneezing, sore throat, trouble swallowing and voice change.   Eyes: Negative for photophobia, pain, discharge, redness, itching  and visual disturbance.  Respiratory: Negative for apnea, cough, choking, chest tightness, shortness of breath, wheezing and stridor.   Cardiovascular: Negative for chest pain, palpitations and leg swelling.  Gastrointestinal: Negative for nausea, vomiting, abdominal pain, diarrhea, constipation, blood in stool, abdominal distention, anal bleeding and rectal pain.  Endocrine: Negative for cold intolerance, heat intolerance, polydipsia, polyphagia and polyuria.  Genitourinary: Negative for dysuria, urgency, frequency, hematuria, flank pain, decreased urine volume, vaginal bleeding, vaginal discharge, enuresis, difficulty urinating, genital sores, vaginal pain, menstrual problem, pelvic pain and dyspareunia.  Musculoskeletal: Positive for myalgias, back pain, arthralgias, neck pain and neck stiffness. Negative for joint swelling and gait problem.  Skin: Negative for color change, pallor, rash and wound.  Allergic/Immunologic: Positive for environmental allergies. Negative for food allergies and immunocompromised state.  Neurological: Negative for dizziness, tremors, seizures, syncope, facial asymmetry, speech difficulty, weakness, light-headedness, numbness and headaches.  Hematological: Negative for adenopathy. Does not bruise/bleed easily.    Psychiatric/Behavioral: Positive for sleep disturbance. Negative for suicidal ideas, hallucinations, behavioral problems, confusion, self-injury, dysphoric mood, decreased concentration and agitation. The patient is not nervous/anxious and is not hyperactive.   All other systems reviewed and are negative.  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 Smoke alarm and carbon monoxide detector in the home.No caffeine use. No unsecured guns in the home.    Marital status:  Married x 18 years;Happily,no abuse. 2nd marriage.      Children: 3 sons (8, 22, 53); 3 grandchildren; 3 step grandchildren; 1 gg.      Lives: with husband, son.      Employment:  Working 25-30 hours per week at [employer]; loves job.      Tobacco:none       Alcohol:         Drugs:      Exercise: Inactive/none.      Seatbelt: 100%      Guns: none   Family History  Problem Relation Age of Onset   Colon cancer Cousin     died age 1   Prostate cancer Father    Lung disease Father    Diabetes Father    Heart disease Father 37    CHF, AMI multiple/CABG   COPD Father    Cancer Father     prostate cancer   Breast cancer Maternal Aunt    Diabetes Mother    Hypertension Mother    Hyperlipidemia Mother    COPD Mother    Cancer Mother     lung cancer   Diabetes Brother    Hypertension Brother    Obesity Brother    Stroke Maternal Grandmother    Heart disease Maternal Grandfather    COPD Paternal Grandmother    Diabetes Paternal Grandmother    COPD  Paternal Grandfather        Objective:   Physical Exam  Nursing note and vitals reviewed. Constitutional: She is oriented to person, place,  and time. She appears well-developed and well-nourished. No distress.  HENT:  Head: Normocephalic and atraumatic.  Right Ear: External ear normal.  Left Ear: External ear normal.  Nose: Nose normal.  Mouth/Throat: Oropharynx is clear and moist.  Cerumen in right ear  Eyes: Conjunctivae and EOM are normal. Pupils are equal, round, and reactive to light.  Neck: Normal range of motion and full passive range of motion without pain. Neck supple. No JVD present. Carotid bruit is not present. No tracheal deviation present. No thyromegaly present.  Cardiovascular: Normal rate, regular rhythm and normal heart sounds.  Exam reveals no gallop and no friction rub.   No murmur heard. Pulmonary/Chest: Effort normal and breath sounds normal. No respiratory distress. She has no wheezes. She has no rales. She exhibits no tenderness. Right breast exhibits no inverted nipple, no mass, no nipple discharge, no skin change and no tenderness. Left breast exhibits no inverted nipple, no mass, no nipple discharge, no skin change and no tenderness. Breasts are symmetrical.  Abdominal: Soft. Bowel sounds are normal. She exhibits no distension and no mass. There is no tenderness. There is no rebound and no guarding.  Genitourinary: Vagina normal. No labial fusion. There is no rash, tenderness, lesion or injury on the right labia. There is no rash, tenderness, lesion or injury on the left labia. Right adnexum displays no mass, no tenderness and no fullness. Left adnexum displays no mass, no tenderness and no fullness. No erythema, tenderness or bleeding around the vagina. No foreign body around the vagina. No signs of injury around the vagina. No vaginal discharge found.  Musculoskeletal: Normal range of motion.       Right shoulder: Normal.       Left shoulder: Normal.        Cervical back: Normal.  Lymphadenopathy:    She has no cervical adenopathy.  Neurological: She is alert and oriented to person, place, and time. She has normal reflexes. No cranial nerve deficit. She exhibits normal muscle tone. Coordination normal.  Skin: Skin is warm and dry. No rash noted. She is not diaphoretic. No erythema. No pallor.  Psychiatric: She has a normal mood and affect. Her behavior is normal. Judgment and thought content normal.      Assessment & Plan:  Routine general medical examination at a health care facility - Plan: POCT urinalysis dipstick, CBC with Differential, COMPLETE METABOLIC PANEL WITH GFR, TSH, CANCELED: EKG 12-Lead  Type II or unspecified type diabetes mellitus without mention of complication, not stated as uncontrolled - Plan: Microalbumin, urine, Hemoglobin A1c  Pure hypercholesterolemia - Plan: Lipid panel   1. Complete Physical Examination: anticipatory guidance --- weight loss, exercise.  No longer warrants pap smears due to hysterectomy status.  Mammogram UTD. Colonoscopy UTD. Immunizations UTD.  2.  Gynecological exam: completed; pap smear and mammogram UTD. Hysterectomy for non-cancerous reasons.  Rx for premarin 0.45mg  daily provided; plan to decrease further next year. 3.  DMII: controlled; obtain labs,urine microalbumin. Eye exam UTD. Refills provided. 4.  Hypercholesterolemia: controlled; obtain labs; refills provided.   Meds ordered this encounter  Medications   dexlansoprazole (DEXILANT) 60 MG capsule    Sig: Take 1 capsule (60 mg total) by mouth daily.    Dispense:  30 capsule    Refill:  11   estrogens, conjugated, (PREMARIN) 0.45 MG tablet    Sig: Take 1 tablet (0.45 mg total) by mouth daily.    Dispense:  30 tablet    Refill:  11   fluticasone (  FLONASE) 50 MCG/ACT nasal spray    Sig: Place 2 sprays into both nostrils daily.    Dispense:  16 g    Refill:  11   metFORMIN (GLUCOPHAGE) 1000 MG tablet    Sig: Take 1 tablet (1,000  mg total) by mouth 2 (two) times daily with a meal.    Dispense:  60 tablet    Refill:  11   simvastatin (ZOCOR) 40 MG tablet    Sig: Take 1 tablet (40 mg total) by mouth at bedtime.    Dispense:  30 tablet    Refill:  11   venlafaxine XR (EFFEXOR-XR) 75 MG 24 hr capsule    Sig: Take 1 capsule (75 mg total) by mouth every evening.    Dispense:  30 capsule    Refill:  11    I personally performed the services described in this documentation, which was scribed in my presence.  The recorded information has been reviewed and is accurate.  Reginia Forts, M.D.  [facility] [address] [place], [place]  [phone] phone [phone] fax

## 2014-01-15 ENCOUNTER — Telehealth: Payer: Self-pay | Admitting: Neurology

## 2014-01-15 NOTE — Telephone Encounter (Signed)
Patient calling to get lab results. Thanks. °

## 2014-01-21 ENCOUNTER — Telehealth: Payer: Self-pay | Admitting: Neurology

## 2014-01-21 NOTE — Telephone Encounter (Signed)
I called and LMVM for pt to return call.   

## 2014-01-21 NOTE — Telephone Encounter (Signed)
Patient calling requesting results of recent lab work. Please call to advise.

## 2014-01-21 NOTE — Telephone Encounter (Signed)
Pt's information was given to Sandy, RN. Please advise °

## 2014-01-22 ENCOUNTER — Encounter: Payer: Self-pay | Admitting: *Deleted

## 2014-01-22 NOTE — Progress Notes (Signed)
Quick Note:  Mailed copy of results along with Dr. Guadelupe Sabin reccomendation. ______

## 2014-01-23 NOTE — Telephone Encounter (Signed)
I called pt and LMVM home # to return call.

## 2014-01-23 NOTE — Telephone Encounter (Signed)
Patient is returning call regarding lab results-she could not understand message that was left--please call before 1pm--thank you.

## 2014-01-26 NOTE — Telephone Encounter (Signed)
Mailed copy of lab results and Dr. Guadelupe Sabin recommendations.

## 2014-04-13 ENCOUNTER — Ambulatory Visit: Payer: 59 | Admitting: Family Medicine

## 2014-04-15 ENCOUNTER — Ambulatory Visit (INDEPENDENT_AMBULATORY_CARE_PROVIDER_SITE_OTHER): Payer: 59 | Admitting: Family Medicine

## 2014-04-15 ENCOUNTER — Encounter: Payer: Self-pay | Admitting: Family Medicine

## 2014-04-15 VITALS — BP 114/79 | HR 65 | Temp 97.9°F | Resp 16 | Ht 65.0 in | Wt 170.4 lb

## 2014-04-15 DIAGNOSIS — E7800 Pure hypercholesterolemia, unspecified: Secondary | ICD-10-CM

## 2014-04-15 DIAGNOSIS — E119 Type 2 diabetes mellitus without complications: Secondary | ICD-10-CM

## 2014-04-15 DIAGNOSIS — D649 Anemia, unspecified: Secondary | ICD-10-CM

## 2014-04-15 DIAGNOSIS — K219 Gastro-esophageal reflux disease without esophagitis: Secondary | ICD-10-CM

## 2014-04-15 DIAGNOSIS — F329 Major depressive disorder, single episode, unspecified: Secondary | ICD-10-CM

## 2014-04-15 DIAGNOSIS — K2900 Acute gastritis without bleeding: Secondary | ICD-10-CM

## 2014-04-15 DIAGNOSIS — H6121 Impacted cerumen, right ear: Secondary | ICD-10-CM

## 2014-04-15 DIAGNOSIS — F3289 Other specified depressive episodes: Secondary | ICD-10-CM

## 2014-04-15 DIAGNOSIS — H6120 Impacted cerumen, unspecified ear: Secondary | ICD-10-CM

## 2014-04-15 LAB — COMPLETE METABOLIC PANEL WITH GFR
ALBUMIN: 4.5 g/dL (ref 3.5–5.2)
ALK PHOS: 60 U/L (ref 39–117)
ALT: 28 U/L (ref 0–35)
AST: 20 U/L (ref 0–37)
BILIRUBIN TOTAL: 0.6 mg/dL (ref 0.2–1.2)
BUN: 23 mg/dL (ref 6–23)
CO2: 28 mEq/L (ref 19–32)
Calcium: 9.7 mg/dL (ref 8.4–10.5)
Chloride: 96 mEq/L (ref 96–112)
Creat: 1.12 mg/dL — ABNORMAL HIGH (ref 0.50–1.10)
GFR, EST NON AFRICAN AMERICAN: 55 mL/min — AB
GFR, Est African American: 63 mL/min
GLUCOSE: 142 mg/dL — AB (ref 70–99)
POTASSIUM: 3.9 meq/L (ref 3.5–5.3)
Sodium: 139 mEq/L (ref 135–145)
Total Protein: 7.1 g/dL (ref 6.0–8.3)

## 2014-04-15 LAB — CBC WITH DIFFERENTIAL/PLATELET
BASOS ABS: 0.1 10*3/uL (ref 0.0–0.1)
Basophils Relative: 1 % (ref 0–1)
Eosinophils Absolute: 0.1 10*3/uL (ref 0.0–0.7)
Eosinophils Relative: 2 % (ref 0–5)
HEMATOCRIT: 40.6 % (ref 36.0–46.0)
Hemoglobin: 13.5 g/dL (ref 12.0–15.0)
LYMPHS PCT: 31 % (ref 12–46)
Lymphs Abs: 1.7 10*3/uL (ref 0.7–4.0)
MCH: 28.8 pg (ref 26.0–34.0)
MCHC: 33.3 g/dL (ref 30.0–36.0)
MCV: 86.6 fL (ref 78.0–100.0)
MONO ABS: 0.4 10*3/uL (ref 0.1–1.0)
Monocytes Relative: 8 % (ref 3–12)
Neutro Abs: 3.2 10*3/uL (ref 1.7–7.7)
Neutrophils Relative %: 58 % (ref 43–77)
Platelets: 231 10*3/uL (ref 150–400)
RBC: 4.69 MIL/uL (ref 3.87–5.11)
RDW: 15.2 % (ref 11.5–15.5)
WBC: 5.6 10*3/uL (ref 4.0–10.5)

## 2014-04-15 LAB — LIPID PANEL
Cholesterol: 190 mg/dL (ref 0–200)
HDL: 83 mg/dL (ref >39)
LDL Cholesterol: 74 mg/dL (ref 0–99)
Total CHOL/HDL Ratio: 2.3 Ratio
Triglycerides: 164 mg/dL — ABNORMAL HIGH (ref <150)
VLDL: 33 mg/dL (ref 0–40)

## 2014-04-15 LAB — HEMOGLOBIN A1C
Hgb A1c MFr Bld: 7.1 % — ABNORMAL HIGH (ref <5.7)
MEAN PLASMA GLUCOSE: 157 mg/dL — AB (ref <117)

## 2014-04-15 MED ORDER — FLUTICASONE PROPIONATE 50 MCG/ACT NA SUSP: 2.0000 | Freq: Every day | NASAL | Status: DC

## 2014-04-15 NOTE — Progress Notes (Signed)
Subjective:    Patient ID: Dorothy [name], female    DOB: Jan 02, 1956, 58 y.o.   MRN: [id number] This chart was scribed for [name], MD by Cathie Hoops, ED Scribe. The patient was seen in Room 2. The patient's care was started at 10:51 AM.   04/15/2014  Diabetes   HPI HPI Comments: Dorothy [name] is a 58 y.o. female who presents to the [facility] for three month follow-up for the following:   Recurrent lower back pain:  complaining of lower back pain onset one month ago. Pt attributes her pain to sitting too much at work. Pt reports some pain radiating down her right leg. Pt reports taking ibuprofen to relieve her symptoms. Pt denies having a recent x-ray of her back. Pt reports she has some stretches for back but denies she has done them to relieve her symptoms. Pt reports she does have a muscle relaxer, but notes she hasn't taken it in about 2 weeks.  Dr. Kathee Delton has performed lower back injections for chronic lower back pain in the past.  Pt sees Dr. Kathee Delton in two weeks; plans to discuss lower back pain during that visit.   Pt reports intermittent, moderate burning in her upper abdomen. Pt denies she is taking Zantac. She reports compliance with Dexilant; no recent follow-up with Dr. Olevia Perches.  Burning never completely resolved after last visit with Dr. Olevia Perches. Denies n/v/d/c; denies bloody or black stools.  Compliance with Dexliant.   Pt reports some right ear pain. Pt reports she has some mild wax build-up and post-nasal drip. Pt reports she has been taking Claritin and nasal spray Flonase; needs a refill of Flonase. Pt reports some gland tenderness on the right. Pt denies chest pain.   Pt reports she had some dizziness associated with her vertigo two days ago but notes it has since resolved. Pt denies cough, SOB, leg swelling, chest pain, numbness, tingling or weakness.  Pt reports she checks her blood sugars at home and reports readings of 130-165 at  dinner. Pt denies blood in stool, black stools, or nausea.    Pt reports her son is doing well. She states he takes his methadone everyday and decreased his socialization.  No longer dating girlfriend.  Pt is doing well emotionally at this time.  She is here for a diabetic follow-up. Her Hemoglobin/A1C was 7.0 at her last visit. We didn't change anything. Follow-up for cholesteorol was under excellent control. Thyroid function was normal. She was slightly anemic at her last visit so we will repeat that. She also has anxiety that is well controlled with Effexor.   Review of Systems  Constitutional: Negative for fever, chills, diaphoresis, fatigue and unexpected weight change.  HENT: Positive for congestion, ear pain (right), hearing loss and postnasal drip. Negative for rhinorrhea, sinus pressure, sneezing, sore throat, tinnitus and trouble swallowing.   Eyes: Negative for visual disturbance.  Respiratory: Negative for cough, shortness of breath, wheezing and stridor.   Cardiovascular: Negative for chest pain, palpitations and leg swelling.  Gastrointestinal: Positive for abdominal pain. Negative for nausea, vomiting, diarrhea, constipation, blood in stool, abdominal distention and anal bleeding.  Endocrine: Negative for cold intolerance, heat intolerance, polydipsia, polyphagia and polyuria.  Musculoskeletal: Positive for arthralgias (right leg) and back pain.  Skin: Negative for color change, pallor, rash and wound.  Neurological: Positive for dizziness. Negative for tremors, seizures, syncope, facial asymmetry, speech difficulty, weakness, light-headedness, numbness and headaches.  Psychiatric/Behavioral: Negative for suicidal ideas,  sleep disturbance, self-injury and dysphoric mood. The patient is not nervous/anxious.   All other systems reviewed and are negative.  Past Medical History  Diagnosis Date  . Fatty liver   . GERD (gastroesophageal reflux disease)   . Gastritis   .  Hyperlipidemia   . Depression   . Anxiety   . Choledocholithiasis   . Hx of adenomatous colonic polyps   . Diabetes mellitus   . Migraine   . Osteoarthritis   . Peptic ulcer, unspecified site, unspecified as acute or chronic, without mention of hemorrhage, perforation, or obstruction   . Otosclerosis, unspecified   . B12 deficiency   . Hematuria, unspecified   . Unspecified disorder of kidney and ureter   . Osteoporosis, unspecified   . Meniere's disease, unspecified   . Benign paroxysmal positional vertigo   . Symptomatic menopausal or female climacteric states   . Allergic rhinitis, cause unspecified   . Fatty liver 09/04/12  . Hiatal hernia   . Fibromyalgia   . Mononeuritis of unspecified site    Past Surgical History  Procedure Laterality Date  . Cholecystectomy    . Tubal ligation    . Shoulder surgery      left  . Breast enhancement surgery Bilateral   . Foot surgery      right  . Mouth surgery    . Bile duct exploration      gallstone removed  . Total abdominal hysterectomy      ovaries intact  . 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 Adhesive [Tape] Other (See Comments)    Tears skins  . Bactrim [Sulfamethoxazole-Tmp Ds] Other (See Comments)    blisters  . Codeine Itching  . Keflex [Cephalexin]   . Macrobid [Nitrofurantoin Macrocrystal] Other (See Comments)    blisters  . Nitrofurantoin Nausea And Vomiting and Other (See Comments)    Blisters   . Pentazocine Lactate     Pt not sure  . Phenazopyridine     Pt not sure  . Sulfa Drugs Cross Reactors Itching  . Sulfamethoxazole-Trimethoprim Itching  . Talwin [Pentazocine] Nausea And Vomiting and Rash    Muscle cramps and vision changes   Current Outpatient Prescriptions  Medication Sig  Dispense Refill  . Calcium Carb-Cholecalciferol (CALCIUM 500 +D) 500-400 MG-UNIT TABS Take 1 tablet by mouth daily.       . Cholecalciferol (VITAMIN D3) 2000 UNITS TABS Take 2,000 Units by mouth daily.       . Cyanocobalamin (VITAMIN B 12 PO) Take 1,500 mg by mouth daily.      [name] dexlansoprazole (DEXILANT) 60 MG capsule Take 1 capsule (60 mg total) by mouth daily.  30 capsule  11  . esomeprazole (NEXIUM) 40 MG capsule Take 40 mg by mouth every evening.      . estrogens, conjugated, (PREMARIN) 0.45 MG tablet Take 1 tablet (0.45 mg total) by mouth daily.  30 tablet  11  . fluticasone (FLONASE) 50 MCG/ACT nasal spray Place 2 sprays into both nostrils daily.  16 g  11  . loperamide (IMODIUM) 2 MG capsule Take 2 mg by mouth daily.      . metFORMIN (GLUCOPHAGE) 1000 MG tablet Take 1 tablet (1,000 mg total) by mouth 2 (two) times daily with a meal.  60 tablet  11  . omega-3 acid ethyl esters (LOVAZA) 1 G capsule Take 1 g by mouth 2 (two) times daily.      [name] DELICA LANCETS 75Z MISC USE TO TEST BLOOD SUGAR DAILY  100 each  3  . pregabalin (LYRICA) 50 MG capsule Take 50 mg by mouth 2 (two) times daily.      . Rotigotine (NEUPRO) 1 MG/24HR PT24 Place 1 patch (1 mg total) onto the skin daily.  30 patch  0  . simvastatin (ZOCOR) 40 MG tablet Take 1 tablet (40 mg total) by mouth at bedtime.  30 tablet  11  . triamterene-hydrochlorothiazide (DYAZIDE) 37.5-25 MG per capsule Take 2 capsules by mouth every morning.      . venlafaxine XR (EFFEXOR-XR) 75 MG 24 hr capsule Take 1 capsule (75 mg total) by mouth every evening.  30 capsule  11  . rotigotine (NEUPRO) 2 MG/24HR Place 1 patch onto the skin daily.  30 patch  5   No current facility-administered medications for this visit.       Objective:  Traige Vitals: BP 114/79  Pulse 65  Temp(Src) 97.9 F (36.6 C) (Oral)  Resp 16  Ht $R'5\' 5"'TD$  (1.651 m)  Wt 170 lb 6.4 oz (77.293 kg)  BMI 28.36 kg/m2  SpO2 100% Physical Exam  Nursing note and vitals  reviewed. Constitutional: She is oriented to person, place, and time. She appears well-developed and well-nourished. No distress.  HENT:  Head: Normocephalic and atraumatic.  Right Ear: External ear normal.  Left Ear: External ear normal.  Nose: Nose normal.  Mouth/Throat: Oropharynx is clear and moist.  Cerumen in R ear canal.    Eyes: Conjunctivae and EOM are normal. Pupils are equal, round, and reactive to light.  Neck: Normal range of motion. Neck supple. No JVD present. Carotid bruit is not present. No tracheal deviation present. No thyromegaly present.  Cardiovascular: Normal rate, regular rhythm, normal heart sounds and intact distal pulses.  Exam reveals no gallop and no friction rub.   No murmur heard. Pulmonary/Chest: Effort normal and breath sounds normal. No respiratory distress. She has no wheezes. She has no rales.  Abdominal: Soft. Bowel sounds are normal. She exhibits no distension and no mass. There is tenderness in the epigastric area. There is no rebound and no guarding.  Some epigastric tenderness.  Musculoskeletal: Normal range of motion.  Lymphadenopathy:    She has no cervical adenopathy.  Neurological: She is alert and oriented to person, place, and time. She has normal reflexes. No cranial nerve deficit or sensory deficit. Gait normal.  Skin: Skin is warm and dry. No rash noted. She is not diaphoretic. No erythema. No pallor.  Psychiatric: She has a normal mood and affect. Her behavior is normal. Judgment and thought content normal.   Results for orders placed in visit on 04/15/14  COMPLETE METABOLIC PANEL WITH GFR      Result Value Ref Range   Sodium 139  135 - 145 mEq/L   Potassium 3.9  3.5 - 5.3 mEq/L   Chloride 96  96 - 112 mEq/L   CO2 28  19 - 32 mEq/L   Glucose, Bld 142 (*) 70 - 99 mg/dL   BUN 23  6 - 23 mg/dL   Creat 1.12 (*) 0.50 - 1.10 mg/dL   Total Bilirubin 0.6  0.2 - 1.2 mg/dL   Alkaline Phosphatase 60  39 - 117 U/L   AST 20  0 - 37 U/L   ALT  28  0 - 35 U/L   Total Protein 7.1  6.0 - 8.3 g/dL   Albumin 4.5  3.5 - 5.2 g/dL   Calcium 9.7  8.4 - 10.5 mg/dL   GFR, Est African American 63     GFR, Est Non African American 55 (*)   CBC WITH DIFFERENTIAL      Result Value Ref Range   WBC 5.6  4.0 - 10.5 K/uL   RBC 4.69  3.87 - 5.11 MIL/uL   Hemoglobin 13.5  12.0 - 15.0 g/dL   HCT 40.6  36.0 - 46.0 %   MCV 86.6  78.0 - 100.0 fL   MCH 28.8  26.0 - 34.0 pg   MCHC 33.3  30.0 - 36.0 g/dL   RDW 15.2  11.5 - 15.5 %   Platelets 231  150 - 400 K/uL   Neutrophils Relative % 58  43 - 77 %   Neutro Abs 3.2  1.7 - 7.7 K/uL   Lymphocytes Relative 31  12 - 46 %   Lymphs Abs 1.7  0.7 - 4.0 K/uL   Monocytes Relative 8  3 - 12 %   Monocytes Absolute 0.4  0.1 - 1.0 K/uL   Eosinophils Relative 2  0 - 5 %   Eosinophils Absolute 0.1  0.0 - 0.7 K/uL   Basophils Relative 1  0 - 1 %   Basophils Absolute 0.1  0.0 - 0.1 K/uL   Smear Review Criteria for review not met    HEMOGLOBIN A1C      Result Value Ref Range   Hemoglobin A1C 7.1 (*) <5.7 %   Mean Plasma Glucose 157 (*) <117 mg/dL  LIPID PANEL      Result Value Ref Range   Cholesterol 190  0 - 200 mg/dL   Triglycerides 164 (*) <150 mg/dL   HDL 83  >39 mg/dL   Total CHOL/HDL Ratio 2.3     VLDL 33  0 - 40 mg/dL   LDL Cholesterol 74  0 - 99 mg/dL       Assessment & Plan:   1. Type II or unspecified type diabetes mellitus without mention of complication, not stated as uncontrolled   2. Pure hypercholesterolemia   3. Cerumen impaction, right   4. Gastroesophageal reflux disease without esophagitis   5. GASTRITIS, ACUTE   6. DEPRESSION   7. Anemia, unspecified anemia type    1. DMII: moderately controlled; if worsens, will add Januvia.  Obtain labs. No changes to management. 2.  Hypercholesterolemia: controlled; obtain labs; continue current medications. 3.  Cerumen Impaction: New.  S/p R ear irrigation in office. 4.  GERD: controlled with Dexilant. 5.  Gastritis: persistent  epigastric burning; continue Dexilant; refill of Zantac provided.   6.  Depression with anxiety: controlled; no changes to therapy at this time. 7. Anemia: New at last visit; repeat today is normal. 8. Low back pain:  Recurrent; recommend stretches daily.  Recommend muscle relaxer qhs. Follow up with [facility] in two weeks.  Meds ordered this encounter  Medications  . fluticasone (FLONASE) 50 MCG/ACT nasal spray    Sig: Place 2 sprays into both nostrils daily.    Dispense:  16 g    Refill:  11    Return in about 3 months (around 07/16/2014) for recheck.   I personally performed the services described in this documentation, which was scribed in my presence.  The recorded information has been reviewed and is accurate.    Reginia Forts,  M.D.  [facility] [address] [place], [place]  [address] [phone] phone [phone] fax

## 2014-04-15 NOTE — Progress Notes (Signed)
After verbal consent colace was placed in patients right ear. Patients right ear was then flushed with peroxide and warm water 50:50 ratio. Moderate amount of cerumen was removed from the right ear, a small amount remains, but appears to be adhered to the ear canal. 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 Patient tolerated procedure well with no complaints of dizziness or discomfort.

## 2014-04-16 MED ORDER — RANITIDINE HCL 300 MG PO TABS: 300.0000 mg | ORAL_TABLET | Freq: Every day | ORAL | Status: DC

## 2014-04-21 MED ORDER — SITAGLIPTIN PHOSPHATE 50 MG PO TABS: 50.0000 mg | ORAL_TABLET | Freq: Every day | ORAL | Status: DC

## 2014-04-21 NOTE — Addendum Note (Signed)
Addended by: [name] on: 04/21/2014 03:59 PM   Modules accepted: Orders

## 2014-05-01 ENCOUNTER — Other Ambulatory Visit: Payer: Self-pay

## 2014-05-01 MED ORDER — METFORMIN HCL 1000 MG PO TABS: 1000.0000 mg | ORAL_TABLET | Freq: Two times a day (BID) | ORAL | Status: DC

## 2014-05-19 ENCOUNTER — Other Ambulatory Visit: Payer: Self-pay

## 2014-05-19 DIAGNOSIS — Z1231 Encounter for screening mammogram for malignant neoplasm of breast: Secondary | ICD-10-CM

## 2014-06-09 ENCOUNTER — Encounter: Payer: Self-pay | Admitting: Neurology

## 2014-06-09 ENCOUNTER — Encounter (INDEPENDENT_AMBULATORY_CARE_PROVIDER_SITE_OTHER): Payer: Self-pay

## 2014-06-09 ENCOUNTER — Ambulatory Visit (INDEPENDENT_AMBULATORY_CARE_PROVIDER_SITE_OTHER): Payer: 59 | Admitting: Neurology

## 2014-06-09 VITALS — BP 117/81 | HR 67 | Temp 96.9°F | Resp 12 | Ht 65.0 in | Wt 171.5 lb

## 2014-06-09 DIAGNOSIS — G2581 Restless legs syndrome: Secondary | ICD-10-CM

## 2014-06-09 DIAGNOSIS — G4761 Periodic limb movement disorder: Secondary | ICD-10-CM

## 2014-06-09 MED ORDER — ROTIGOTINE 2 MG/24HR TD PT24
1.0000 | MEDICATED_PATCH | Freq: Every day | TRANSDERMAL | Status: DC
Start: 2014-06-09 — End: 2015-02-22

## 2014-06-09 NOTE — Progress Notes (Signed)
Subjective:    Patient ID: Dorothy [name] is a 58 y.o. female.  HPI    Interim history:   Dorothy [name] is a 58 year old right-handed woman with an underlying complex medical history of diabetes, hyperlipidemia, depression, fatty liver, reflux disease, anxiety, gallstones, migraine headaches, osteoarthritis, peptic ulcer disease, vitamin B12 deficiency, Mnire's disease and benign positional vertigo, allergic rhinitis, hiatal hernia and fibromyalgia, who presents for followup consultation of her RLS. She is unaccompanied today. Last saw her on 01/09/2014, at which time she reported significant RLS symptoms and difficulty with sleep onset as well as sleep maintenance. Sleep issues have been a long-standing problem for her. She also reported a recent cardiac workup and other than PVCs there were no abnormalities found. I talked her about her sleep test results in detail. I suggested we check iron studies. Her ferritin level was 10 and we called her with the test results. I advised her to start an over-the-counter iron supplement. Her hemoglobin and hematocrit were slightly low which were checked by her primary care provider a few days later. I also suggested she start Neupro patch for symptomatic treatment of RLS. I started her on 1 mg strength with increased to 2 mg. She has been diagnosed with diabetes in the interim. She follows with Dr. Estanislado Pandy for arthritis.   Today, she reports doing better with Neupro. She is using the 2 mg patch. She denies any side effects and feels that she is sleeping so much better. Unfortunately she had an accident with a patch one time were it fell off of her stomach as she was changing clothes and her little 5 pound dog apparently lifted or put it in her mouth and started vomiting. She became lethargic and her eyes rolled up and to call poison control and take her to the fat immediately. Since then the patient has been very careful and applying and removing the patch. One  time she forgot to change it out and noted increase in her RLS symptoms. She has been taking her iron supplement daily. She is trying to lose weight and has joined a gym. She is now on the second diabetes medication, Januvia per PCP.   I first met her on 11/13/2013 at the request of her primary care physician, at which time she reported intermittent shortness of breath, palpitations, indigestion, and excessive daytime somnolence, with a need for daily nap after work between 2 and 6 PM. She also reported restless leg symptoms and jerking in her sleep. She reported having had a sleep study many years ago which she recalled showed snoring. 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 She has braces on top, which may come out soon. 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 She is a non-smoker and drinks alcohol very occasionally and does not drink caffeine. She lost about 15 lb from the jaw surgery, but has mostly gained it back. I suggested she return for sleep study. She she had a baseline sleep study on 12/17/2013: Sleep efficiency was reduced at 62.9% latency to sleep of 73 minutes and wake after sleep onset of 77 minutes with moderate to severe sleep fragmentation noted. She had an elevated arousal index of 28.5 per hour primarily because of periodic leg movements. She had a normal percentage of stage I sleep, a markedly increased percentage of stage II sleep, and absence of slow-wave and REM sleep. She had  severe periodic leg movements of sleep. Index was 76.9 per hour with an associated arousal index of 21.5 per hour. She had no significant EKG changes. Minimal intermittent snoring was noted. She slept mostly in the lateral positions. She had a total AHI of 4 per hour, rising to 25.4 per hour in the supine position. Baseline oxygen saturation was 95%, nadir was 90%. I felt that the absence of REM  sleep and her estimated her potential underlying sleep disorder breathing. I felt that her Effexor may be a contributor to her restless leg symptoms and PLMS.   Her Past Medical History Is Significant For: Past Medical History  Diagnosis Date  . Fatty liver   . GERD (gastroesophageal reflux disease)   . Gastritis   . Hyperlipidemia   . Depression   . Anxiety   . Choledocholithiasis   . Hx of adenomatous colonic polyps   . Diabetes mellitus   . Migraine   . Osteoarthritis   . Peptic ulcer, unspecified site, unspecified as acute or chronic, without mention of hemorrhage, perforation, or obstruction   . Otosclerosis, unspecified   . B12 deficiency   . Hematuria, unspecified   . Unspecified disorder of kidney and ureter   . Osteoporosis, unspecified   . Meniere's disease, unspecified   . Benign paroxysmal positional vertigo   . Symptomatic menopausal or female climacteric states   . Allergic rhinitis, cause unspecified   . Fatty liver 09/04/12  . Hiatal hernia   . Fibromyalgia   . Mononeuritis of unspecified site     Her Past Surgical History Is Significant For: Past Surgical History  Procedure Laterality Date  . Cholecystectomy    . Tubal ligation    . Shoulder surgery      left  . Breast enhancement surgery Bilateral   . Foot surgery      right  . Mouth surgery    . Bile duct exploration      gallstone removed  . Total abdominal hysterectomy      ovaries intact  . 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 Colon cancer Cousin     died age 21  . Prostate cancer Father   . Lung disease Father   . Diabetes Father   . Heart disease Father 49    CHF, AMI multiple/CABG  . COPD Father   . Cancer Father      prostate cancer  . Breast cancer Maternal Aunt   . Diabetes Mother   . Hypertension Mother   . Hyperlipidemia Mother   . COPD Mother   . Cancer Mother     lung cancer  . Diabetes Brother   . Hypertension Brother   . Obesity Brother   . Stroke Maternal Grandmother   . Heart disease Maternal Grandfather   . COPD Paternal Grandmother   . Diabetes Paternal Grandmother   . COPD Paternal Grandfather     Her Social History Is Significant For: History   Social History  . Marital Status: Married    Spouse Name: Richardson Landry    Number of Children: 3  . 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 Smoking status: Never Smoker   . Smokeless tobacco: Never Used  . Alcohol Use: Yes     Comment: occasional 1 x month  . Drug Use: No  . Sexual Activity: Yes   Other Topics Concern  . None   Social History Narrative   Always uses seat belts. Smoke alarm and carbon monoxide detector in the home.No caffeine use. No unsecured guns in the home.    Marital status:  Married x 18 years;Happily,no abuse. 2nd marriage.      Children: 3 sons (28, 9, 52); 3 grandchildren; 3 step grandchildren; 1 gg.      Lives: with husband, son.      Employment:  Working 25-30 hours per week at [employer]; loves job.      Tobacco:none       Alcohol:         Drugs:      Exercise: Inactive/none.      Seatbelt: 100%      Guns: none    Her Allergies Are:  Allergies  Allergen Reactions  . Adhesive [Tape] Other (See Comments)    Tears skins  . Bactrim [Sulfamethoxazole-Tmp Ds] Other (See Comments)    blisters  . Codeine Itching  . Keflex [Cephalexin]   . Macrobid [Nitrofurantoin Macrocrystal] Other (See Comments)    blisters  . Nitrofurantoin Nausea And Vomiting and Other (See Comments)    Blisters   . Pentazocine Lactate     Pt not sure  . Phenazopyridine     Pt not sure  . Sulfa Drugs Cross Reactors Itching  .  Sulfamethoxazole-Trimethoprim Itching  . Talwin [Pentazocine] Nausea And Vomiting and Rash    Muscle cramps and vision changes  :   Her Current Medications Are:  Outpatient Encounter Prescriptions as of 06/09/2014  Medication Sig  . Calcium Carb-Cholecalciferol (CALCIUM 500 +D) 500-400 MG-UNIT TABS Take 1 tablet by mouth daily.   . Cholecalciferol (VITAMIN D3) 2000 UNITS TABS Take 2,000 Units by mouth daily.   . Cyanocobalamin (VITAMIN B 12 PO) Take 1,500 mg by mouth daily.  [name] dexlansoprazole (DEXILANT) 60 MG capsule Take 1 capsule (60 mg total) by mouth daily.  [name] esomeprazole (NEXIUM) 40 MG capsule Take 40 mg by mouth every evening.  . estrogens, conjugated, (PREMARIN) 0.45 MG tablet Take 1 tablet (0.45 mg total) by mouth daily.  . fluticasone (FLONASE) 50 MCG/ACT nasal spray Place 2 sprays into both nostrils daily.  [name] loperamide (IMODIUM) 2 MG capsule Take 2 mg by mouth daily.  . metFORMIN (GLUCOPHAGE) 1000 MG tablet Take 1 tablet (1,000 mg total) by mouth 2 (two) times daily with a meal.  . omega-3 acid ethyl esters (LOVAZA) 1 G capsule Take 1 g by mouth 2 (two) times daily.  [name] DELICA LANCETS 66A MISC USE TO TEST BLOOD SUGAR DAILY  . pregabalin (LYRICA) 50 MG capsule Take 50 mg by mouth 2 (two) times daily.  . ranitidine (ZANTAC) 300 MG tablet Take 1 tablet (300 mg total) by mouth at bedtime.  . Rotigotine (NEUPRO) 1 MG/24HR PT24 Place 1 patch (1 mg total) onto the skin daily.  . rotigotine (NEUPRO) 2 MG/24HR Place 1 patch onto the skin daily.  . simvastatin (ZOCOR) 40 MG tablet Take 1 tablet (40 mg total) by mouth at bedtime.  . sitaGLIPtin (JANUVIA) 50 MG tablet Take 1 tablet (50 mg total) by mouth daily.  [name] triamterene-hydrochlorothiazide (DYAZIDE) 37.5-25 MG per capsule Take 2 capsules by mouth every morning.  [name]  venlafaxine XR (EFFEXOR-XR) 75 MG 24 hr capsule Take 1 capsule (75 mg total) by mouth every evening.  [name] VOLTAREN 1 % GEL   . [DISCONTINUED] rotigotine (NEUPRO) 2  MG/24HR Place 1 patch onto the skin daily.  :  Review of Systems:  Out of a complete 14 point review of systems, all are reviewed and negative with the exception of these symptoms as listed below:   Review of Systems  Constitutional: Positive for fatigue.  HENT:       Tinnitus  Endocrine:       Excessive thirst  Musculoskeletal:       Joint pain, back pain, aching muscles, muscle cramps, neck pain, neck stiffness  Neurological:       RL, daytime sleepiness, dizziness  Psychiatric/Behavioral:       Depression     Objective:  Neurologic Exam  Physical Exam Physical Examination:   Filed Vitals:   06/09/14 1430  BP: 117/81  Pulse: 67  Temp: 96.9 F (36.1 C)  Resp: 12    General Examination: The patient is a very pleasant 58 y.o. female in no acute distress. She appears well-developed and well-nourished and well groomed.   HEENT: Normocephalic, atraumatic, pupils are equal, round and reactive to light and accommodation. Funduscopic exam is normal with sharp disc margins noted. Extraocular tracking is good without limitation to gaze excursion or nystagmus noted. Normal smooth pursuit is noted. Hearing is grossly intact. Face is symmetric with normal facial animation and normal facial sensation. Speech is clear with no dysarthria noted. There is no hypophonia. There is no lip, neck/head, jaw or voice tremor. Neck is supple with full range of passive and active motion. There are no carotid bruits on auscultation. Oropharynx exam reveals: mild mouth dryness, adequate dental hygiene with upper braces in place and moderate airway crowding, due to larger and elongated uvula and narrow airway entry. Mallampati is class II. Tongue protrudes centrally and palate elevates symmetrically. Tonsils are 1+ in size.   Chest: Clear to auscultation without wheezing, rhonchi or crackles noted.  Heart: S1+S2+0, regular and normal without murmurs, rubs or gallops noted.   Abdomen: Soft, non-tender  and non-distended with normal bowel sounds appreciated on auscultation.  Extremities: There is no pitting edema in the distal lower extremities bilaterally. Pedal pulses are intact.  Skin: Warm and dry without trophic changes noted. There are no varicose veins.  Musculoskeletal: exam reveals no obvious joint deformities, tenderness or joint swelling or erythema.   Neurologically:  Mental status: The patient is awake, alert and oriented in all 4 spheres. Her immediate and remote memory, attention, language skills and fund of knowledge are appropriate. There is no evidence of aphasia, agnosia, apraxia or anomia. Speech is clear with normal prosody and enunciation. Thought process is linear. Mood is normal and affect is normal.  Cranial nerves II - XII are as described above under HEENT exam. In addition: shoulder shrug is normal with equal shoulder height noted. Motor exam: Normal bulk, strength and tone is noted. There is no drift, tremor or rebound. Romberg is negative. Reflexes are 2 to 3+ throughout. Babinski: Toes are flexor bilaterally. Fine motor skills and coordination: intact with normal finger taps, normal hand movements, normal rapid alternating patting, normal foot taps and normal foot agility.  Cerebellar testing: No dysmetria or intention tremor on finger to nose testing. Heel to shin is unremarkable bilaterally. There is no truncal or gait ataxia.  Sensory exam: intact to light touch, pinprick, vibration, temperature sense in  the upper and lower extremities.  Gait, station and balance: She stands easily. No veering to one side is noted. No leaning to one side is noted. Posture is age-appropriate and stance is narrow based. Gait shows normal stride length and normal pace. No problems turning are noted. She turns en bloc. Tandem walk is unremarkable.           Assessment and Plan:   In summary, Dorothy [name] is a very pleasant 58 year old female with an underlying complex medical  history of diabetes, hyperlipidemia, depression, fatty liver, reflux disease, anxiety, gallstones, migraine headaches, osteoarthritis, peptic ulcer disease, vitamin B12 deficiency, Mnire's disease and benign positional vertigo, allergic rhinitis, hiatal hernia and fibromyalgia, who presents for followup consultation of her restless leg syndrome. In May I placed her on Neupro and she is up to 2 mg once daily. She has noted improvement in her RLS symptoms and better sleep. Thankfully she has not had any side effects but her dog did like one of the patches as it fell off while she was changing clothes. The patient is advised to be very careful as to where she places the patch and is reminded to make sure it does not and on the floor or somewhere where her pets can get to it. Thankfully she has done well with it herself. She has been taking an iron supplement. We will check iron studies today to make sure her ferritin level is about 50 which should be targeted for RLS patients. At this juncture, I renewed her prescription for Neupro patch 2 mg strength once daily and we will call her with her blood test results. I would like to see her back in 6 months, sooner the need arises. Her exam is stable and nonfocal at this time. I answered all her questions today and she was in agreement.

## 2014-06-09 NOTE — Patient Instructions (Signed)
We will recheck iron studies today and see if you still need extra iron.   We will continue with Neupro patch and please use your upper arms and thighs for placement. Make sure your dogs don't get to the medication.

## 2014-06-10 LAB — IRON AND TIBC
Iron Saturation: 14 % — ABNORMAL LOW (ref 15–55)
Iron: 63 ug/dL (ref 35–155)
TIBC: 448 ug/dL (ref 250–450)
UIBC: 385 ug/dL — ABNORMAL HIGH (ref 150–375)

## 2014-06-10 LAB — FERRITIN: FERRITIN: 17 ng/mL (ref 15–150)

## 2014-06-10 NOTE — Progress Notes (Signed)
Quick Note:  Please call patient regarding her blood test results. Numbers are improved as far as iron studies. Storage iron called ferritin is improved from 10 to 17 in the last 5 months but still too low. Again, would like to see the ferritin level above 50. Therefore I would like for the patient to continue with her iron supplement as discussed during the visit yesterday. thx Star Age, MD, PhD [facility] ([facility]  ______

## 2014-06-16 NOTE — Progress Notes (Signed)
Quick Note:  I called and gave her the results of the labs. She is to continue her iron supplement. I will release to my chart and she will sign up. ______

## 2014-07-15 ENCOUNTER — Encounter: Payer: Self-pay | Admitting: Family Medicine

## 2014-07-15 ENCOUNTER — Ambulatory Visit (INDEPENDENT_AMBULATORY_CARE_PROVIDER_SITE_OTHER): Payer: 59 | Admitting: Family Medicine

## 2014-07-15 ENCOUNTER — Ambulatory Visit: Payer: 59 | Admitting: Family Medicine

## 2014-07-15 VITALS — BP 115/70 | HR 72 | Temp 98.5°F | Resp 16 | Ht 65.0 in | Wt 169.0 lb

## 2014-07-15 DIAGNOSIS — F32A Depression, unspecified: Secondary | ICD-10-CM

## 2014-07-15 DIAGNOSIS — E119 Type 2 diabetes mellitus without complications: Secondary | ICD-10-CM

## 2014-07-15 DIAGNOSIS — F329 Major depressive disorder, single episode, unspecified: Secondary | ICD-10-CM

## 2014-07-15 DIAGNOSIS — K2900 Acute gastritis without bleeding: Secondary | ICD-10-CM

## 2014-07-15 DIAGNOSIS — K219 Gastro-esophageal reflux disease without esophagitis: Secondary | ICD-10-CM

## 2014-07-15 DIAGNOSIS — R1013 Epigastric pain: Secondary | ICD-10-CM

## 2014-07-15 DIAGNOSIS — Z23 Encounter for immunization: Secondary | ICD-10-CM

## 2014-07-15 DIAGNOSIS — F418 Other specified anxiety disorders: Secondary | ICD-10-CM

## 2014-07-15 DIAGNOSIS — F419 Anxiety disorder, unspecified: Secondary | ICD-10-CM

## 2014-07-15 DIAGNOSIS — E785 Hyperlipidemia, unspecified: Secondary | ICD-10-CM

## 2014-07-15 LAB — CBC WITH DIFFERENTIAL/PLATELET
Basophils Absolute: 0 10*3/uL (ref 0.0–0.1)
Basophils Relative: 0 % (ref 0–1)
EOS ABS: 0 10*3/uL (ref 0.0–0.7)
Eosinophils Relative: 1 % (ref 0–5)
HCT: 42.1 % (ref 36.0–46.0)
HEMOGLOBIN: 14 g/dL (ref 12.0–15.0)
LYMPHS ABS: 1.1 10*3/uL (ref 0.7–4.0)
Lymphocytes Relative: 28 % (ref 12–46)
MCH: 30.6 pg (ref 26.0–34.0)
MCHC: 33.3 g/dL (ref 30.0–36.0)
MCV: 91.9 fL (ref 78.0–100.0)
MPV: 10.6 fL (ref 9.4–12.4)
Monocytes Absolute: 0.3 10*3/uL (ref 0.1–1.0)
Monocytes Relative: 8 % (ref 3–12)
NEUTROS PCT: 63 % (ref 43–77)
Neutro Abs: 2.6 10*3/uL (ref 1.7–7.7)
Platelets: 220 10*3/uL (ref 150–400)
RBC: 4.58 MIL/uL (ref 3.87–5.11)
RDW: 14.9 % (ref 11.5–15.5)
WBC: 4.1 10*3/uL (ref 4.0–10.5)

## 2014-07-15 LAB — COMPREHENSIVE METABOLIC PANEL
ALT: 24 U/L (ref 0–35)
AST: 16 U/L (ref 0–37)
Albumin: 4.2 g/dL (ref 3.5–5.2)
Alkaline Phosphatase: 50 U/L (ref 39–117)
BILIRUBIN TOTAL: 0.5 mg/dL (ref 0.2–1.2)
BUN: 17 mg/dL (ref 6–23)
CO2: 28 meq/L (ref 19–32)
Calcium: 9.4 mg/dL (ref 8.4–10.5)
Chloride: 100 mEq/L (ref 96–112)
Creat: 1.1 mg/dL (ref 0.50–1.10)
GLUCOSE: 128 mg/dL — AB (ref 70–99)
POTASSIUM: 3.4 meq/L — AB (ref 3.5–5.3)
Sodium: 139 mEq/L (ref 135–145)
TOTAL PROTEIN: 6.5 g/dL (ref 6.0–8.3)

## 2014-07-15 LAB — LIPID PANEL
CHOLESTEROL: 254 mg/dL — AB (ref 0–200)
HDL: 75 mg/dL (ref >39)
LDL Cholesterol: 151 mg/dL — ABNORMAL HIGH (ref 0–99)
Total CHOL/HDL Ratio: 3.4 Ratio
Triglycerides: 140 mg/dL (ref <150)
VLDL: 28 mg/dL (ref 0–40)

## 2014-07-15 LAB — POCT GLYCOSYLATED HEMOGLOBIN (HGB A1C): Hemoglobin A1C: 6.6

## 2014-07-15 MED ORDER — SITAGLIPTIN PHOSPHATE 100 MG PO TABS: 100.0000 mg | ORAL_TABLET | Freq: Every day | ORAL | Status: DC

## 2014-07-15 MED ORDER — SUCRALFATE 1 GM/10ML PO SUSP: 1.0000 g | Freq: Three times a day (TID) | ORAL | Status: DC

## 2014-07-15 MED ORDER — RANITIDINE HCL 300 MG PO TABS: 300.0000 mg | ORAL_TABLET | Freq: Every day | ORAL | Status: DC

## 2014-07-15 NOTE — Progress Notes (Signed)
Subjective:    Patient ID: Dorothy [name], female    DOB: 1955/12/27, 58 y.o.   MRN: [id number]  07/15/2014  Diabetes; Hyperlipidemia; and Depression   HPI This 58 y.o. female presents for three month follow-up:  1.  DMII: sugars are not coming down any.  Added Januvia 50mg  daily.  Running 132-184 fasting.   This morning sugars were 147.  Cut out sweets now.  Joined the gym; [employer] off of [address].  Going to sign up for a trainer.   Last eye exam 4 months ago with Tommy Rainwater.    2. RLS: started Neupro patch within past six months; followed by neurology.  Cannot tell a difference on medication.  Continues to suffer with symptoms.  3.  Hyperlipidemia: fasting; Patient reports good compliance with medication, good tolerance to medication, and good symptom control.    4.  Anxiety and depression:   Son on Methadone; son with horrible GI issues with GERD.  Cannot find a job.  Feels emotionally stable.  Patient reports good compliance with medication, good tolerance to medication, and good symptom control.    5. GERD/abdominal pain epigastric:  No improvement with Zantac 300mg  qhs; continues taking Dexilant. Denies n/v/d/c; denies bloody stools or melena.   Review of Systems  Constitutional: Negative for fever, chills, diaphoresis and fatigue.  Eyes: Negative for visual disturbance.  Respiratory: Negative for cough and shortness of breath.   Cardiovascular: Negative for chest pain, palpitations and leg swelling.  Gastrointestinal: Negative for nausea, vomiting, abdominal pain, diarrhea and constipation.  Endocrine: Negative for cold intolerance, heat intolerance, polydipsia, polyphagia and polyuria.  Skin: Negative for color change, pallor, rash and wound.  Neurological: Negative for dizziness, tremors, seizures, syncope, facial asymmetry, speech difficulty, weakness, light-headedness, numbness and headaches.  Psychiatric/Behavioral: Negative for suicidal ideas, sleep  disturbance, self-injury and dysphoric mood. The patient is not nervous/anxious.     Past Medical History  Diagnosis Date  . Fatty liver   . GERD (gastroesophageal reflux disease)   . Gastritis   . Hyperlipidemia   . Depression   . Anxiety   . Choledocholithiasis   . Hx of adenomatous colonic polyps   . Diabetes mellitus   . Migraine   . Osteoarthritis   . Peptic ulcer, unspecified site, unspecified as acute or chronic, without mention of hemorrhage, perforation, or obstruction   . Otosclerosis, unspecified   . B12 deficiency   . Hematuria, unspecified   . Unspecified disorder of kidney and ureter   . Osteoporosis, unspecified   . Meniere's disease, unspecified   . Benign paroxysmal positional vertigo   . Symptomatic menopausal or female climacteric states   . Allergic rhinitis, cause unspecified   . Fatty liver 09/04/12  . Hiatal hernia   . Fibromyalgia   . Mononeuritis of unspecified site    Past Surgical History  Procedure Laterality Date  . Cholecystectomy    . Tubal ligation    . Shoulder surgery      left  . Breast enhancement surgery Bilateral   . Foot surgery      right  . Mouth surgery    . Bile duct exploration      gallstone removed  . Total abdominal hysterectomy      ovaries intact  . 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 Adhesive [Tape] Other (See Comments)    Tears skins  . Bactrim [Sulfamethoxazole-Trimethoprim] Other (See Comments)    blisters  . Codeine Itching  . Keflex [Cephalexin]   . Macrobid [Nitrofurantoin Macrocrystal] Other (See Comments)    blisters  . Nitrofurantoin Nausea And Vomiting and Other (See Comments)    Blisters   . Pentazocine Lactate     Pt not sure  . Phenazopyridine     Pt not sure  . Sulfa Drugs  Cross Reactors Itching  . Sulfamethoxazole-Trimethoprim Itching  . Talwin [Pentazocine] Nausea And Vomiting and Rash    Muscle cramps and vision changes   Current Outpatient Prescriptions  Medication Sig Dispense Refill  . Calcium Carb-Cholecalciferol (CALCIUM 500 +D) 500-400 MG-UNIT TABS Take 1 tablet by mouth daily.     . Cholecalciferol (VITAMIN D3) 2000 UNITS TABS Take 2,000 Units by mouth daily.     . Cyanocobalamin (VITAMIN B 12 PO) Take 1,500 mg by mouth daily.    [name] dexlansoprazole (DEXILANT) 60 MG capsule Take 1 capsule (60 mg total) by mouth daily. 30 capsule 11  . esomeprazole (NEXIUM) 40 MG capsule Take 40 mg by mouth every evening.    . estrogens, conjugated, (PREMARIN) 0.45 MG tablet Take 1 tablet (0.45 mg total) by mouth daily. 30 tablet 11  . fluticasone (FLONASE) 50 MCG/ACT nasal spray Place 2 sprays into both nostrils daily. 16 g 11  . loperamide (IMODIUM) 2 MG capsule Take 2 mg by mouth daily.    . metFORMIN (GLUCOPHAGE) 1000 MG tablet Take 1 tablet (1,000 mg total) by mouth 2 (two) times daily with a meal. 60 tablet 8  . omega-3 acid ethyl esters (LOVAZA) 1 G capsule Take 1 g by mouth 2 (two) times daily.    [name] DELICA LANCETS 03K MISC USE TO TEST BLOOD SUGAR DAILY 100 each 3  . pregabalin (LYRICA) 50 MG capsule Take 50 mg by mouth 2 (two) times daily.    . Rotigotine (NEUPRO) 1 MG/24HR PT24 Place 1 patch (1 mg total) onto the skin daily. 30 patch 0  . rotigotine (NEUPRO) 2 MG/24HR Place 1 patch onto the skin daily. 30 patch 5  . simvastatin (ZOCOR) 40 MG tablet Take 1 tablet (40 mg total) by mouth at bedtime. 30 tablet 11  . sitaGLIPtin (JANUVIA) 100 MG tablet Take 1 tablet (100 mg total) by mouth daily. 90 tablet 3  . triamterene-hydrochlorothiazide (DYAZIDE) 37.5-25 MG per capsule Take 2 capsules by mouth every morning.    . venlafaxine XR (EFFEXOR-XR) 75 MG 24 hr capsule Take 1 capsule (75 mg total) by mouth every evening. 30 capsule 11  . VOLTAREN 1 % GEL       . ranitidine (ZANTAC) 300 MG tablet Take 1 tablet (300 mg total) by mouth at bedtime. 90 tablet 3  . sucralfate (CARAFATE) 1 GM/10ML suspension Take 10 mLs (1 g total) by mouth 4 (four) times daily -  with meals and at bedtime. 420 mL 2   No current facility-administered medications for this visit.       Objective:    BP 115/70 mmHg  Pulse 72  Temp(Src) 98.5 F (36.9 C) (Oral)  Resp 16  Ht 5\' 5"  (1.651 m)  Wt 169 lb (76.658 kg)  BMI 28.12 kg/m2  SpO2 98% Physical Exam  Constitutional: She is oriented to person, place, and time. She appears well-developed and well-nourished. No distress.  HENT:  Head: Normocephalic and atraumatic.  Right Ear:  External ear normal.  Left Ear: External ear normal.  Nose: Nose normal.  Mouth/Throat: Oropharynx is clear and moist.  Eyes: Conjunctivae and EOM are normal. Pupils are equal, round, and reactive to light.  Neck: Normal range of motion. Neck supple. Carotid bruit is not present. No thyromegaly present.  Cardiovascular: Normal rate, regular rhythm, normal heart sounds and intact distal pulses.  Exam reveals no gallop and no friction rub.   No murmur heard. Pulmonary/Chest: Effort normal and breath sounds normal. She has no wheezes. She has no rales.  Abdominal: Soft. Bowel sounds are normal. She exhibits no distension and no mass. There is no tenderness. There is no rebound and no guarding.  Lymphadenopathy:    She has no cervical adenopathy.  Neurological: She is alert and oriented to person, place, and time. No cranial nerve deficit.  Skin: Skin is warm and dry. No rash noted. She is not diaphoretic. No erythema. No pallor.  Psychiatric: She has a normal mood and affect. Her behavior is normal.        Assessment & Plan:   1. Need for prophylactic vaccination and inoculation against influenza   2. Type 2 diabetes mellitus without complication   3. Hyperlipidemia   4. Anxiety and depression   5. Gastroesophageal reflux disease  without esophagitis   6. Acute gastritis without hemorrhage   7. Abdominal pain, epigastric      1. DMII: controlled moderately; increase Januvia to 100mg  daily; continue metformin; eye exam UTD. 2.  Hyperlipidemia: controlled; obtain labs; continue current medications. 3.  Anxiety and depression: stable despite ongoing family stressors with two sons with polysubstance abuse.  No changes to management. 4.  GERD/Abdominal pain/epigastric pain/gastritis: persistent despite Zantac 300mg  qhs.  Rx for Carafate provided to use qac, qhs.  If no improvement, follow-up with Dr. Olevia Perches of GI. 5. S/p flu vaccine 6. RLS: persistent; followed by neurology.   Meds ordered this encounter  Medications  . sitaGLIPtin (JANUVIA) 100 MG tablet    Sig: Take 1 tablet (100 mg total) by mouth daily.    Dispense:  90 tablet    Refill:  3  . ranitidine (ZANTAC) 300 MG tablet    Sig: Take 1 tablet (300 mg total) by mouth at bedtime.    Dispense:  90 tablet    Refill:  3  . sucralfate (CARAFATE) 1 GM/10ML suspension    Sig: Take 10 mLs (1 g total) by mouth 4 (four) times daily -  with meals and at bedtime.    Dispense:  420 mL    Refill:  2    Return in about 3 months (around 10/15/2014) for recheck.    Reginia Forts, M.D.  [facility] [address] [place], [place]  [address] [phone] phone [phone] fax

## 2014-07-15 NOTE — Addendum Note (Signed)
Addended by: [name] on: 07/15/2014 05:37 PM   Modules accepted: Orders, Medications

## 2014-07-17 ENCOUNTER — Ambulatory Visit
Admission: RE | Admit: 2014-07-17 | Discharge: 2014-07-17 | Disposition: A | Discharge status: Home / Self Care | Payer: 59 | Source: Ambulatory Visit

## 2014-07-17 DIAGNOSIS — Z1231 Encounter for screening mammogram for malignant neoplasm of breast: Secondary | ICD-10-CM

## 2014-07-27 ENCOUNTER — Encounter (HOSPITAL_COMMUNITY): Payer: Self-pay

## 2014-07-27 ENCOUNTER — Emergency Department (HOSPITAL_COMMUNITY): Payer: 59

## 2014-07-27 ENCOUNTER — Emergency Department (HOSPITAL_COMMUNITY)
Admission: EM | Admit: 2014-07-27 | Discharge: 2014-07-27 | Disposition: A | Discharge status: Home / Self Care | Payer: 59 | Attending: Emergency Medicine | Admitting: Emergency Medicine

## 2014-07-27 DIAGNOSIS — G43909 Migraine, unspecified, not intractable, without status migrainosus: Secondary | ICD-10-CM | POA: Diagnosis not present

## 2014-07-27 DIAGNOSIS — R0602 Shortness of breath: Secondary | ICD-10-CM

## 2014-07-27 DIAGNOSIS — Z7982 Long term (current) use of aspirin: Secondary | ICD-10-CM | POA: Diagnosis not present

## 2014-07-27 DIAGNOSIS — K219 Gastro-esophageal reflux disease without esophagitis: Secondary | ICD-10-CM | POA: Insufficient documentation

## 2014-07-27 DIAGNOSIS — R0989 Other specified symptoms and signs involving the circulatory and respiratory systems: Secondary | ICD-10-CM

## 2014-07-27 DIAGNOSIS — E785 Hyperlipidemia, unspecified: Secondary | ICD-10-CM | POA: Insufficient documentation

## 2014-07-27 DIAGNOSIS — J810 Acute pulmonary edema: Secondary | ICD-10-CM | POA: Diagnosis not present

## 2014-07-27 DIAGNOSIS — F419 Anxiety disorder, unspecified: Secondary | ICD-10-CM | POA: Insufficient documentation

## 2014-07-27 DIAGNOSIS — Z79899 Other long term (current) drug therapy: Secondary | ICD-10-CM | POA: Insufficient documentation

## 2014-07-27 DIAGNOSIS — Z86010 Personal history of colonic polyps: Secondary | ICD-10-CM | POA: Insufficient documentation

## 2014-07-27 DIAGNOSIS — F329 Major depressive disorder, single episode, unspecified: Secondary | ICD-10-CM | POA: Diagnosis not present

## 2014-07-27 DIAGNOSIS — E538 Deficiency of other specified B group vitamins: Secondary | ICD-10-CM | POA: Insufficient documentation

## 2014-07-27 DIAGNOSIS — M1990 Unspecified osteoarthritis, unspecified site: Secondary | ICD-10-CM | POA: Diagnosis not present

## 2014-07-27 LAB — BASIC METABOLIC PANEL
ANION GAP: 17 — AB (ref 5–15)
BUN: 18 mg/dL (ref 6–23)
CO2: 22 mEq/L (ref 19–32)
Calcium: 9.6 mg/dL (ref 8.4–10.5)
Chloride: 101 mEq/L (ref 96–112)
Creatinine, Ser: 1 mg/dL (ref 0.50–1.10)
GFR calc Af Amer: 71 mL/min — ABNORMAL LOW (ref >90)
GFR, EST NON AFRICAN AMERICAN: 61 mL/min — AB (ref >90)
Glucose, Bld: 103 mg/dL — ABNORMAL HIGH (ref 70–99)
Potassium: 3.3 mEq/L — ABNORMAL LOW (ref 3.7–5.3)
Sodium: 140 mEq/L (ref 137–147)

## 2014-07-27 LAB — CBC WITH DIFFERENTIAL/PLATELET
BASOS ABS: 0 10*3/uL (ref 0.0–0.1)
Basophils Relative: 0 % (ref 0–1)
EOS ABS: 0 10*3/uL (ref 0.0–0.7)
Eosinophils Relative: 1 % (ref 0–5)
HCT: 36.2 % (ref 36.0–46.0)
HEMOGLOBIN: 12 g/dL (ref 12.0–15.0)
Lymphocytes Relative: 27 % (ref 12–46)
Lymphs Abs: 1.4 10*3/uL (ref 0.7–4.0)
MCH: 30.9 pg (ref 26.0–34.0)
MCHC: 33.1 g/dL (ref 30.0–36.0)
MCV: 93.3 fL (ref 78.0–100.0)
MONOS PCT: 8 % (ref 3–12)
Monocytes Absolute: 0.4 10*3/uL (ref 0.1–1.0)
Neutro Abs: 3.5 10*3/uL (ref 1.7–7.7)
Neutrophils Relative %: 64 % (ref 43–77)
PLATELETS: 182 10*3/uL (ref 150–400)
RBC: 3.88 MIL/uL (ref 3.87–5.11)
RDW: 14.6 % (ref 11.5–15.5)
WBC: 5.4 10*3/uL (ref 4.0–10.5)

## 2014-07-27 LAB — I-STAT TROPONIN, ED: Troponin i, poc: 0 ng/mL (ref 0.00–0.08)

## 2014-07-27 LAB — PRO B NATRIURETIC PEPTIDE: PRO B NATRI PEPTIDE: 515 pg/mL — AB (ref 0–125)

## 2014-07-27 MED ORDER — FUROSEMIDE 20 MG PO TABS: 20.0000 mg | ORAL_TABLET | Freq: Every day | ORAL | Status: DC

## 2014-07-27 MED ORDER — FUROSEMIDE 10 MG/ML IJ SOLN
20.0000 mg | Freq: Once | INTRAMUSCULAR | Status: AC
  Administered 2014-07-27: 20 mg via INTRAVENOUS
  Filled 2014-07-27: qty 4

## 2014-07-27 NOTE — ED Provider Notes (Signed)
CSN: [id number]     Arrival date & time 07/27/14  1306 History   First MD Initiated Contact with Patient 07/27/14 1317     Chief Complaint  Patient presents with  . Shortness of Breath     (Consider location/radiation/quality/duration/timing/severity/associated sxs/prior Treatment) HPI  Dorothy [name] is a 58 y.o. female with PMH of DM, HTN, Hyperlipidemia, anxiety, depression presenting with 10Ib weight gain in 2 weeks, shortness of breath in the past two days with bilateral swelling in legs and mild dyspnea. Pt has not taken her diuretics (dyazide) for 1.5 weeks since she was unable to get an appointment with her ENT physician who prescribes it for Meniere's disease. Pt has appointment tomorrow. Pt denies chest pain, cough, nausea, vomiting, recent illnesses, back pain. Pt saw a cardiologist 6 months ago and per patient the provider said her heart was okay. She did not have stress test.   Past Medical History  Diagnosis Date  . Fatty liver   . GERD (gastroesophageal reflux disease)   . Gastritis   . Hyperlipidemia   . Depression   . Anxiety   . Choledocholithiasis   . Hx of adenomatous colonic polyps   . Diabetes mellitus   . Migraine   . Osteoarthritis   . Peptic ulcer, unspecified site, unspecified as acute or chronic, without mention of hemorrhage, perforation, or obstruction   . Otosclerosis, unspecified   . B12 deficiency   . Hematuria, unspecified   . Unspecified disorder of kidney and ureter   . Osteoporosis, unspecified   . Meniere's disease, unspecified   . Benign paroxysmal positional vertigo   . Symptomatic menopausal or female climacteric states   . Allergic rhinitis, cause unspecified   . Fatty liver 09/04/12  . Hiatal hernia   . Fibromyalgia   . Mononeuritis of unspecified site    Past Surgical History  Procedure Laterality Date  . Cholecystectomy    . Tubal ligation    . Shoulder surgery      left  . Breast enhancement surgery Bilateral   . Foot  surgery      right  . Mouth surgery    . Bile duct exploration      gallstone removed  . Total abdominal hysterectomy      ovaries intact  . 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 Colon cancer Cousin     died age 14  . Prostate cancer Father   . Lung disease Father   . Diabetes Father   . Heart disease Father 72    CHF, AMI multiple/CABG  . COPD Father   . Cancer Father     prostate cancer  . Breast cancer Maternal Aunt   . Diabetes Mother   . Hypertension Mother   . Hyperlipidemia Mother   . COPD Mother   . Cancer Mother     lung cancer  . Diabetes Brother   . Hypertension Brother   . Obesity Brother   . Stroke Maternal Grandmother   . Heart disease Maternal Grandfather   . COPD Paternal Grandmother   . Diabetes Paternal Grandmother   . COPD Paternal Grandfather    History  Substance Use Topics  . Smoking status: Never Smoker   . Smokeless tobacco: Never Used  .  Alcohol Use: Yes     Comment: occasional 1 x month   OB History    No data available     Review of Systems  Constitutional: Negative for fever and chills.  HENT: Negative for congestion and rhinorrhea.   Eyes: Negative for visual disturbance.  Respiratory: Positive for shortness of breath. Negative for cough.   Cardiovascular: Positive for leg swelling. Negative for chest pain.  Gastrointestinal: Negative for nausea, vomiting and diarrhea.  Genitourinary: Negative for dysuria and hematuria.  Musculoskeletal: Negative for back pain and gait problem.  Skin: Negative for rash.  Neurological: Negative for weakness and headaches.      Allergies  Adhesive; Bactrim; Codeine; Keflex; Macrobid; Nitrofurantoin; Pentazocine lactate; Phenazopyridine; Sulfa drugs cross reactors;  Sulfamethoxazole-trimethoprim; and Talwin  Home Medications   Prior to Admission medications   Medication Sig Start Date End Date Taking? Authorizing Provider  aspirin 81 MG tablet Take 81 mg by mouth every evening.   Yes Historical Provider, MD  Calcium Carb-Cholecalciferol (CALCIUM 500 +D) 500-400 MG-UNIT TABS Take 1 tablet by mouth daily.    Yes Historical Provider, MD  Cholecalciferol (VITAMIN D3) 2000 UNITS TABS Take 2,000 Units by mouth daily.    Yes Historical Provider, MD  Cyanocobalamin (VITAMIN B 12 PO) Take 1,500 mg by mouth daily.   Yes Historical Provider, MD  dexlansoprazole (DEXILANT) 60 MG capsule Take 1 capsule (60 mg total) by mouth daily. 01/12/14  Yes [name], MD  estrogens, conjugated, (PREMARIN) 0.45 MG tablet Take 1 tablet (0.45 mg total) by mouth daily. 01/12/14  Yes [name], MD  fluticasone (FLONASE) 50 MCG/ACT nasal spray Place 2 sprays into both nostrils daily. Patient taking differently: Place 2 sprays into both nostrils daily as needed for allergies.  04/15/14  Yes [name], MD  loperamide (IMODIUM) 2 MG capsule Take 2 mg by mouth daily.   Yes Historical Provider, MD  metFORMIN (GLUCOPHAGE) 1000 MG tablet Take 1 tablet (1,000 mg total) by mouth 2 (two) times daily with a meal. 05/01/14  Yes [name], MD  omega-3 acid ethyl esters (LOVAZA) 1 G capsule Take 1 g by mouth 2 (two) times daily.   Yes Historical Provider, MD  [facility] DELICA LANCETS 32G MISC USE TO TEST BLOOD SUGAR DAILY   Yes [name], MD  pregabalin (LYRICA) 50 MG capsule Take 50 mg by mouth 2 (two) times daily.   Yes Historical Provider, MD  ranitidine (ZANTAC) 300 MG tablet Take 1 tablet (300 mg total) by mouth at bedtime. 07/15/14  Yes [name], MD  rotigotine (NEUPRO) 2 MG/24HR Place 1 patch onto the skin daily. 06/09/14  Yes Star Age, MD  simvastatin (ZOCOR) 40 MG tablet Take 1 tablet (40 mg total) by mouth at bedtime. 01/12/14  Yes [name], MD  sitaGLIPtin  (JANUVIA) 100 MG tablet Take 1 tablet (100 mg total) by mouth daily. 07/15/14  Yes [name], MD  sucralfate (CARAFATE) 1 GM/10ML suspension Take 10 mLs (1 g total) by mouth 4 (four) times daily -  with meals and at bedtime. 07/15/14  Yes [name], MD  triamterene-hydrochlorothiazide (DYAZIDE) 37.5-25 MG per capsule Take 2 capsules by mouth every morning.   Yes Historical Provider, MD  venlafaxine XR (EFFEXOR-XR) 75 MG 24 hr capsule Take 1 capsule (75 mg total) by mouth every evening. 01/12/14  Yes [name], MD  VOLTAREN 1 % GEL Apply 2 g topically 2 (two) times daily as needed (  pain).  04/27/14  Yes Historical Provider, MD  furosemide (LASIX) 20 MG tablet Take 1 tablet (20 mg total) by mouth daily. 07/27/14   Pura Spice, PA-C  Rotigotine (NEUPRO) 1 MG/24HR PT24 Place 1 patch (1 mg total) onto the skin daily. Patient not taking: Reported on 07/27/2014 01/09/14   Star Age, MD   BP 117/60 mmHg  Pulse 68  Temp(Src) 98.4 F (36.9 C) (Oral)  Resp 16  SpO2 99% Physical Exam  Constitutional: She appears well-developed and well-nourished. No distress.  HENT:  Head: Normocephalic and atraumatic.  No facial swelling  Eyes: Conjunctivae and EOM are normal. Right eye exhibits no discharge. Left eye exhibits no discharge.  Neck: Normal range of motion. Neck supple. No JVD present.  Cardiovascular: Normal rate, regular rhythm and normal heart sounds.   +1 pitting edema equal bilaterally. No skin changes. Negative Homan's sign.   Pulmonary/Chest: Effort normal and breath sounds normal. No respiratory distress. She has no wheezes.  Abdominal: Soft. Bowel sounds are normal. She exhibits no distension. There is no tenderness.  Neurological: She is alert. She exhibits normal muscle tone. Coordination normal.  Skin: Skin is warm and dry. She is not diaphoretic.  Nursing note and vitals reviewed.   ED Course  Procedures (including critical care time) Labs Review Labs Reviewed   BASIC METABOLIC PANEL - Abnormal; Notable for the following:    Potassium 3.3 (*)    Glucose, Bld 103 (*)    GFR calc non Af Amer 61 (*)    GFR calc Af Amer 71 (*)    Anion gap 17 (*)    All other components within normal limits  PRO B NATRIURETIC PEPTIDE - Abnormal; Notable for the following:    Pro B Natriuretic peptide (BNP) 515.0 (*)    All other components within normal limits  CBC WITH DIFFERENTIAL  I-STAT TROPOININ, ED    Imaging Review Dg Chest 2 View  07/27/2014   CLINICAL DATA:  Shortness of breath that began 1 day ago. Patient is normally on fluid pills those not taken this medication for several days.  EXAM: CHEST  2 VIEW  COMPARISON:  10/27/2013; chest CT- 11/19/2013  FINDINGS: Grossly unchanged borderline enlarged cardiac silhouette mediastinal contours with atherosclerotic plaque within the thoracic aorta. There is mild tortuosity of the thoracic aorta. Mild pulmonary venous congestion without frank evidence of edema. Slight worsening of bibasilar heterogeneous opacities, left greater than right, likely atelectasis. No discrete focal airspace opacities. No pleural effusion or pneumothorax. There is minimal pleural parenchymal thickening about the bilateral major and the right minor fissures. No pneumothorax. Unchanged bones. Post cholecystectomy.  IMPRESSION: 1. Cardiomegaly and pulmonary venous congestion without definite evidence of edema. 2. Slight worsening bibasilar atelectasis, left greater than right.   Electronically Signed   By: Sandi Mariscal M.D.   On: 07/27/2014 14:39     EKG Interpretation   Date/Time:  Monday July 27 2014 13:47:59 EST Ventricular Rate:  54 PR Interval:  163 QRS Duration: 94 QT Interval:  465 QTC Calculation: 441 R Axis:   87 Text Interpretation:  Sinus rhythm Low voltage, precordial leads Minimal  ST depression, inferior leads No prior for comparison Confirmed by Mingo Amber   MD, BLAIR ([id number]) on 07/27/2014 1:51:29 PM      MDM   Final  diagnoses:  Shortness of breath  Pulmonary venous congestion   Patient very well appearing. Pt with SOB but no chest pain. VSS, no JVD or new murmur, RRR, breath sounds  equal bilaterally, negative troponin, chest x-ray with venous congestion but no definite edema and stable borderline cardiomegaly. EKG with findings above. BNP 515. Pt ambulatory in ED without shortness of breath. Concerned with shortness of breath possibly being cardiac in origin but believe due to patients absence of CP, nontoxic appearance, mild peripheral edema and ability to ambulate in ED without SOB, pt stable for outpt cardiology follow up. Consult cardiology and spoke with Trish to schedule close outpatient follow up. Appointment scheduled in 3 days. Patient is afebrile, nontoxic, and in no acute distress. Patient is appropriate for outpatient management and is stable for discharge. Pt has been advised to return to the ED if she develops CP that is exertional, associated with diaphoresis or nausea, radiates to left jaw/arm, worsens or becomes concerning in any way. Pt to follow up with PCP as well. Pt appears reliable for follow up and is agreeable to discharge.   Discussed return precautions with patient. Discussed all results and patient verbalizes understanding and agrees with plan.  This is a shared patient. This patient was discussed with the physician, Dr. Mingo Amber who saw and evaluated the patient and agrees with the plan.      Pura Spice, PA-C 07/27/14 [place], MD 07/28/14 [phone]

## 2014-07-27 NOTE — ED Notes (Signed)
Per pt, has meniere's disease.  Pt has been off her fluid pill for a week.  Went to get refill and told she has to go to MD for script.  Pt has shortness of breath started last night with swelling in ankles and stomach/face.  No chest pain.  No cough

## 2014-07-27 NOTE — ED Notes (Signed)
Pt alert and oriented x4. Respirations even and unlabored, bilateral symmetrical rise and fall of chest. Skin warm and dry. In no acute distress. Denies needs.   

## 2014-07-27 NOTE — ED Notes (Signed)
PA at bedside Pt alert and oriented x4. Respirations even and unlabored, bilateral symmetrical rise and fall of chest. Skin warm and dry. In no acute distress. Denies needs.   

## 2014-07-27 NOTE — ED Notes (Signed)
Pt to xray

## 2014-07-27 NOTE — ED Notes (Signed)
Pt back from x-ray.

## 2014-07-27 NOTE — ED Notes (Signed)
Pt escorted to discharge window. Pt verbalized understanding discharge instructions. In no acute distress.  

## 2014-07-27 NOTE — Discharge Instructions (Signed)
Return to the emergency room with worsening of symptoms, new symptoms or with symptoms that are concerning, especially worsening of shortness of breath, chest pain, nausea, vomiting, difficulty breathing, fevers. Follow up with cardiology on Thursday as scheduled. Take furosemide 20mg  daily Please call your doctor for a followup appointment within 24-48 hours. When you talk to your doctor please let them know that you were seen in the emergency department and have them acquire all of your records so that they can discuss the findings with you and formulate a treatment plan to fully care for your new and ongoing problems. If you do not have a primary care provider please call the number below under ED resources to establish care with a provider and follow up.     Shortness of Breath Shortness of breath means you have trouble breathing. It could also mean that you have a medical problem. You should get immediate medical care for shortness of breath. CAUSES   Not enough oxygen in the air such as with high altitudes or a smoke-filled room.  Certain lung diseases, infections, or problems.  Heart disease or conditions, such as angina or heart failure.  Low red blood cells (anemia).  Poor physical fitness, which can cause shortness of breath when you exercise.  Chest or back injuries or stiffness.  Being overweight.  Smoking.  Anxiety, which can make you feel like you are not getting enough air. DIAGNOSIS  Serious medical problems can often be found during your physical exam. Tests may also be done to determine why you are having shortness of breath. Tests may include:  Chest X-rays.  Lung function tests.  Blood tests.  An electrocardiogram (ECG).  An ambulatory electrocardiogram. An ambulatory ECG records your heartbeat patterns over a 24-hour period.  Exercise testing.  A transthoracic echocardiogram (TTE). During echocardiography, sound waves are used to evaluate how blood  flows through your heart.  A transesophageal echocardiogram (TEE).  Imaging scans. Your health care provider may not be able to find a cause for your shortness of breath after your exam. In this case, it is important to have a follow-up exam with your health care provider as directed.  TREATMENT  Treatment for shortness of breath depends on the cause of your symptoms and can vary greatly. HOME CARE INSTRUCTIONS   Do not smoke. Smoking is a common cause of shortness of breath. If you smoke, ask for help to quit.  Avoid being around chemicals or things that may bother your breathing, such as paint fumes and dust.  Rest as needed. Slowly resume your usual activities.  If medicines were prescribed, take them as directed for the full length of time directed. This includes oxygen and any inhaled medicines.  Keep all follow-up appointments as directed by your health care provider. SEEK MEDICAL CARE IF:   Your condition does not improve in the time expected.  You have a hard time doing your normal activities even with rest.  You have any new symptoms. SEEK IMMEDIATE MEDICAL CARE IF:   Your shortness of breath gets worse.  You feel light-headed, faint, or develop a cough not controlled with medicines.  You start coughing up blood.  You have pain with breathing.  You have chest pain or pain in your arms, shoulders, or abdomen.  You have a fever.  You are unable to walk up stairs or exercise the way you normally do. MAKE SURE YOU:  Understand these instructions.  Will watch your condition.  Will get help right  away if you are not doing well or get worse. Document Released: 05/09/2001 Document Revised: 08/19/2013 Document Reviewed: 10/30/2011 [facility] Patient Information 2015 [place], [place]. This information is not intended to replace advice given to you by your health care provider. Make sure you discuss any questions you have with your health care provider.

## 2014-07-30 ENCOUNTER — Encounter: Payer: Self-pay | Admitting: Physician Assistant

## 2014-07-30 ENCOUNTER — Ambulatory Visit (INDEPENDENT_AMBULATORY_CARE_PROVIDER_SITE_OTHER): Payer: 59 | Admitting: Physician Assistant

## 2014-07-30 VITALS — BP 128/82 | HR 69 | Ht 65.0 in | Wt 176.0 lb

## 2014-07-30 DIAGNOSIS — R0600 Dyspnea, unspecified: Secondary | ICD-10-CM

## 2014-07-30 DIAGNOSIS — E785 Hyperlipidemia, unspecified: Secondary | ICD-10-CM

## 2014-07-30 DIAGNOSIS — E119 Type 2 diabetes mellitus without complications: Secondary | ICD-10-CM

## 2014-07-30 DIAGNOSIS — I509 Heart failure, unspecified: Secondary | ICD-10-CM

## 2014-07-30 LAB — BASIC METABOLIC PANEL
BUN: 22 mg/dL (ref 6–23)
CALCIUM: 9.4 mg/dL (ref 8.4–10.5)
CO2: 27 mEq/L (ref 19–32)
Chloride: 101 mEq/L (ref 96–112)
Creatinine, Ser: 1.1 mg/dL (ref 0.4–1.2)
GFR: 56.64 mL/min — ABNORMAL LOW (ref >60.00)
Glucose, Bld: 104 mg/dL — ABNORMAL HIGH (ref 70–99)
Potassium: 3.4 mEq/L — ABNORMAL LOW (ref 3.5–5.1)
SODIUM: 139 meq/L (ref 135–145)

## 2014-07-30 LAB — BRAIN NATRIURETIC PEPTIDE: Pro B Natriuretic peptide (BNP): 31 pg/mL (ref 0.0–100.0)

## 2014-07-30 MED ORDER — FUROSEMIDE 20 MG PO TABS: 20.0000 mg | ORAL_TABLET | Freq: Every day | ORAL | Status: DC

## 2014-07-30 NOTE — Progress Notes (Signed)
Cardiology Office Note   Date:  07/30/2014   ID:  Dorothy [name], DOB 1956/03/09, MRN [id number]  PCP:  Reginia Forts, MD  Cardiologist:  Dr. Liam Rogers >> Dr. Sherren Mocha (patient request)   History of Present Illness: Dorothy [name] is a 58 y.o. female with a hx of DM2, HL, FHx of CAD, edema, Meniere's disease.  She was evaluated by Dr. Liam Rogers in 11/2013 for dyspnea and chest pain.  She had a CT of the chest in 10/2013 that was normal.  Symptoms were felt to be atypical.  No further cardiac testing was indicated.  She presented to the ED 07/27/14 with DOE.  BNP was elevated and she was started on Lasix.  She was set up for FU today.    The patient tells me that she has had some occasional dyspnea with exertion over the last few months with associated chest tightness. She does admit to eating a large amount of salt throughout the day. She is on Dyazide for her Mnire's disease. She ran out of this about a week prior to her visit to the emergency room. Two days prior to her visit to the ED, she noted increasing dyspnea with minimal exertion (NYHA 3-3b).  She also described 2-3 pillow orthopnea, PND, increasing LE edema and increased abdominal girth. She noted that her weight had gone from a baseline of 169 up to 181 pounds. She received IV Lasix in the emergency room and has been on Lasix 20 mg daily since. She notes improvement in her symptoms. She currently denies any further orthopnea. Her breathing is back to baseline. She denies further chest discomfort. She denies syncope or near-syncope.   Recent Labs: 01/12/2014: TSH 4.075 07/15/2014: ALT 24; LDL (calc) 151* 07/27/2014: BUN 18; Creatinine 1.00; Hemoglobin 12.0; Potassium 3.3*; Pro B Natriuretic peptide (BNP) 515.0*; Sodium 140    Recent Radiology: Dg Chest 2 View  07/27/2014     IMPRESSION: 1. Cardiomegaly and pulmonary venous congestion without definite evidence of edema. 2. Slight worsening bibasilar atelectasis, left  greater than right.   Electronically Signed   By: Sandi Mariscal M.D.   On: 07/27/2014 14:39     Wt Readings from Last 3 Encounters:  07/30/14 176 lb (79.833 kg)  07/15/14 169 lb (76.658 kg)  06/09/14 171 lb 8 oz (77.792 kg)     Past Medical History  Diagnosis Date  . Fatty liver   . GERD (gastroesophageal reflux disease)   . Gastritis   . Hyperlipidemia   . Depression   . Anxiety   . Choledocholithiasis   . Hx of adenomatous colonic polyps   . Diabetes mellitus   . Migraine   . Osteoarthritis   . Peptic ulcer, unspecified site, unspecified as acute or chronic, without mention of hemorrhage, perforation, or obstruction   . Otosclerosis, unspecified   . B12 deficiency   . Hematuria, unspecified   . Unspecified disorder of kidney and ureter   . Osteoporosis, unspecified   . Meniere's disease, unspecified   . Benign paroxysmal positional vertigo   . Symptomatic menopausal or female climacteric states   . Allergic rhinitis, cause unspecified   . Fatty liver 09/04/12  . Hiatal hernia   . Fibromyalgia   . Mononeuritis of unspecified site     Current Outpatient Prescriptions  Medication Sig Dispense Refill  . aspirin 81 MG tablet Take 81 mg by mouth every evening.    . Calcium Carb-Cholecalciferol (CALCIUM 500 +D) 500-400 MG-UNIT TABS Take 1  tablet by mouth daily.     . Cholecalciferol (VITAMIN D3) 2000 UNITS TABS Take 2,000 Units by mouth daily.     . Cyanocobalamin (VITAMIN B 12 PO) Take 1,500 mg by mouth daily.    [name] dexlansoprazole (DEXILANT) 60 MG capsule Take 1 capsule (60 mg total) by mouth daily. 30 capsule 11  . estrogens, conjugated, (PREMARIN) 0.45 MG tablet Take 1 tablet (0.45 mg total) by mouth daily. 30 tablet 11  . fluticasone (FLONASE) 50 MCG/ACT nasal spray Place 2 sprays into both nostrils daily. (Patient taking differently: Place 2 sprays into both nostrils daily as needed for allergies. ) 16 g 11  . furosemide (LASIX) 20 MG tablet Take 1 tablet (20 mg total) by  mouth daily. 20 tablet 0  . loperamide (IMODIUM) 2 MG capsule Take 2 mg by mouth daily.    . metFORMIN (GLUCOPHAGE) 1000 MG tablet Take 1 tablet (1,000 mg total) by mouth 2 (two) times daily with a meal. 60 tablet 8  . omega-3 acid ethyl esters (LOVAZA) 1 G capsule Take 1 g by mouth 2 (two) times daily.    [name] DELICA LANCETS 28N MISC USE TO TEST BLOOD SUGAR DAILY 100 each 3  . pregabalin (LYRICA) 50 MG capsule Take 50 mg by mouth 2 (two) times daily.    . ranitidine (ZANTAC) 300 MG tablet Take 1 tablet (300 mg total) by mouth at bedtime. 90 tablet 3  . rotigotine (NEUPRO) 2 MG/24HR Place 1 patch onto the skin daily. 30 patch 5  . simvastatin (ZOCOR) 40 MG tablet Take 1 tablet (40 mg total) by mouth at bedtime. 30 tablet 11  . sitaGLIPtin (JANUVIA) 100 MG tablet Take 1 tablet (100 mg total) by mouth daily. 90 tablet 3  . sucralfate (CARAFATE) 1 GM/10ML suspension Take 10 mLs (1 g total) by mouth 4 (four) times daily -  with meals and at bedtime. 420 mL 2  . triamterene-hydrochlorothiazide (DYAZIDE) 37.5-25 MG per capsule Take 2 capsules by mouth every morning. Discontinue for 20 days while on lasix    . venlafaxine XR (EFFEXOR-XR) 75 MG 24 hr capsule Take 1 capsule (75 mg total) by mouth every evening. 30 capsule 11  . VOLTAREN 1 % GEL Apply 2 g topically 2 (two) times daily as needed (pain).      No current facility-administered medications for this visit.     Allergies:   Adhesive; Bactrim; Codeine; Keflex; Macrobid; Nitrofurantoin; Sulfa drugs cross reactors; and Talwin   Social History:  The patient  reports that she has never smoked. She has never used smokeless tobacco. She reports that she drinks alcohol. She reports that she does not use illicit drugs.   Family History:  The patient's family history includes Breast cancer in her maternal aunt; COPD in her father, mother, paternal grandfather, and paternal grandmother; Cancer in her father and mother; Colon cancer in her cousin;  Diabetes in her brother, father, mother, and paternal grandmother; Heart disease in her maternal grandfather; Heart disease (age of onset: 31) in her father; Hyperlipidemia in her mother; Hypertension in her brother and mother; Lung disease in her father; Obesity in her brother; Prostate cancer in her father; Stroke in her maternal grandmother.    ROS:  Please see the history of present illness.      All other systems reviewed and negative.    PHYSICAL EXAM: VS:  BP 128/82 mmHg  Pulse 69  Ht 5\' 5"  (1.651 m)  Wt 176 lb (79.833 kg)  BMI 29.29 kg/m2 Well nourished, well developed, in no acute distress HEENT: normal Neck: no JVD Cardiac:  normal S1, S2;  RRR; no murmur   Lungs:   clear to auscultation bilaterally, no wheezing, rhonchi or rales Abd: soft, nontender, no hepatomegaly Ext: no edema Skin: warm and dry Neuro:  CNs 2-12 intact, no focal abnormalities noted  EKG:  NSR, HR 69, normal axis, NSSTTW changes, no change from prior tracing      ASSESSMENT AND PLAN:  1.  Congestive heart failure, unspecified congestive heart failure chronicity, unspecified congestive heart failure type:  The patient recently developed symptoms highly suggestive of CHF. Her BNP was over 500.  She notes improvement with Lasix.  Her ENT has noted that she could remain on either diuretic for treatment of her Mnire's disease.  She admits to a diet high in salt.  I suspect she has underlying diastolic dysfunction which likely explains her chronic DOE.  She likely developed volume excess in the setting of running out of her usual diuretic and high salt load (Thanksgiving Holiday).    -  Check 2D echocardiogram to assess LVF.    -  Repeat BMET and BNP today.    -  Continue Lasix and remain off of Dyazide.      -  Maintain low Na diet. 2.  Dyspnea:  I suspect her dyspnea and chest discomfort are more related to volume excess.  However, she is a diabetic.  Continue diuresis as noted above.  Obtain echo as noted.       -  If Echo demonstrates reduced LV function, she will likely need cardiac cath.    -  Arrange ETT-Myoview (to be done if EF is normal) to rule out ischemia.     -  Continue ASA. 3.  HLD (hyperlipidemia):  Managed by PCP.  Continue statin.  4.  Type 2 diabetes mellitus without complication:  70/48/8891: Hemoglobin-A1c 6.6.  Good control.  FU with PCP.   Disposition:   I spent > 40 minutes with the patient today evaluating her and counseling her on the pathophysiology of CHF and appropriate treatment.  FU with me or Dr. Sherren Mocha 2-3 weeks.    Signed, Versie Starks, MHS 07/30/2014 8:21 AM    [facility] [address], [place], [place]  [address] Phone: [phone]; Fax: [phone]

## 2014-07-30 NOTE — Patient Instructions (Addendum)
I think you probably have diastolic dysfunction leading to excess fluid.  This is called diastolic heart failure.  Please review the information below.  REFILL FOR LASIX WAS SENT IN TODAY  Labs today:  BMET, BNP.  Schedule Echocardiogram.  Schedule Exercise Myoview (needs to be done about 1 week after the echocardiogram).  Follow up with Richardson Dopp, PA-C in 2-3 weeks same day Dr. Burt Knack is in the office    Heart Failure Heart failure is a condition in which the heart has trouble pumping blood. This means your heart does not pump blood efficiently for your body to work well. In some cases of heart failure, fluid may back up into your lungs or you may have swelling (edema) in your lower legs. Heart failure is usually a long-term (chronic) condition. It is important for you to take good care of yourself and follow your health care provider's treatment plan. CAUSES  Some health conditions can cause heart failure. Those health conditions include:  High blood pressure (hypertension). Hypertension causes the heart muscle to work harder than normal. When pressure in the blood vessels is high, the heart needs to pump (contract) with more force in order to circulate blood throughout the body. High blood pressure eventually causes the heart to become stiff and weak.  Coronary artery disease (CAD). CAD is the buildup of cholesterol and fat (plaque) in the arteries of the heart. The blockage in the arteries deprives the heart muscle of oxygen and blood. This can cause chest pain and may lead to a heart attack. High blood pressure can also contribute to CAD.  Heart attack (myocardial infarction). A heart attack occurs when one or more arteries in the heart become blocked. The loss of oxygen damages the muscle tissue of the heart. When this happens, part of the heart muscle dies. The injured tissue does not contract as well and weakens the heart's ability to pump blood.  Abnormal heart valves. When the  heart valves do not open and close properly, it can cause heart failure. This makes the heart muscle pump harder to keep the blood flowing.  Heart muscle disease (cardiomyopathy or myocarditis). Heart muscle disease is damage to the heart muscle from a variety of causes. These can include drug or alcohol abuse, infections, or unknown reasons. These can increase the risk of heart failure.  Lung disease. Lung disease makes the heart work harder because the lungs do not work properly. This can cause a strain on the heart, leading it to fail.  Diabetes. Diabetes increases the risk of heart failure. High blood sugar contributes to high fat (lipid) levels in the blood. Diabetes can also cause slow damage to tiny blood vessels that carry important nutrients to the heart muscle. When the heart does not get enough oxygen and food, it can cause the heart to become weak and stiff. This leads to a heart that does not contract efficiently.  Other conditions can contribute to heart failure. These include abnormal heart rhythms, thyroid problems, and low blood counts (anemia). Certain unhealthy behaviors can increase the risk of heart failure, including:  Being overweight.  Smoking or chewing tobacco.  Eating foods high in fat and cholesterol.  Abusing illicit drugs or alcohol.  Lacking physical activity. SYMPTOMS  Heart failure symptoms may vary and can be hard to detect. Symptoms may include:  Shortness of breath with activity, such as climbing stairs.  Persistent cough.  Swelling of the feet, ankles, legs, or abdomen.  Unexplained weight gain.  Difficulty  breathing when lying flat (orthopnea).  Waking from sleep because of the need to sit up and get more air.  Rapid heartbeat.  Fatigue and loss of energy.  Feeling light-headed, dizzy, or close to fainting.  Loss of appetite.  Nausea.  Increased urination during the night (nocturia). DIAGNOSIS  A diagnosis of heart failure is based  on your history, symptoms, physical examination, and diagnostic tests. Diagnostic tests for heart failure may include:  Echocardiography.  Electrocardiography.  Chest X-ray.  Blood tests.  Exercise stress test.  Cardiac angiography.  Radionuclide scans. TREATMENT  Treatment is aimed at managing the symptoms of heart failure. Medicines, behavioral changes, or surgical intervention may be necessary to treat heart failure.  Medicines to help treat heart failure may include:  Angiotensin-converting enzyme (ACE) inhibitors. This type of medicine blocks the effects of a blood protein called angiotensin-converting enzyme. ACE inhibitors relax (dilate) the blood vessels and help lower blood pressure.  Angiotensin receptor blockers (ARBs). This type of medicine blocks the actions of a blood protein called angiotensin. Angiotensin receptor blockers dilate the blood vessels and help lower blood pressure.  Water pills (diuretics). Diuretics cause the kidneys to remove salt and water from the blood. The extra fluid is removed through urination. This loss of extra fluid lowers the volume of blood the heart pumps.  Beta blockers. These prevent the heart from beating too fast and improve heart muscle strength.  Digitalis. This increases the force of the heartbeat.  Healthy behavior changes include:  Obtaining and maintaining a healthy weight.  Stopping smoking or chewing tobacco.  Eating heart-healthy foods.  Limiting or avoiding alcohol.  Stopping illicit drug use.  Physical activity as directed by your health care provider.  Surgical treatment for heart failure may include:  A procedure to open blocked arteries, repair damaged heart valves, or remove damaged heart muscle tissue.  A pacemaker to improve heart muscle function and control certain abnormal heart rhythms.  An internal cardioverter defibrillator to treat certain serious abnormal heart rhythms.  A left ventricular  assist device (LVAD) to assist the pumping ability of the heart. HOME CARE INSTRUCTIONS   Take medicines only as directed by your health care provider. Medicines are important in reducing the workload of your heart, slowing the progression of heart failure, and improving your symptoms.  Do not stop taking your medicine unless directed by your health care provider.  Do not skip any dose of medicine.  Refill your prescriptions before you run out of medicine. Your medicines are needed every day.  Engage in moderate physical activity if directed by your health care provider. Moderate physical activity can benefit some people. The elderly and people with severe heart failure should consult with a health care provider for physical activity recommendations.  Eat heart-healthy foods. Food choices should be free of trans fat and low in saturated fat, cholesterol, and salt (sodium). Healthy choices include fresh or frozen fruits and vegetables, fish, lean meats, legumes, fat-free or low-fat dairy products, and whole grain or high fiber foods. Talk to a dietitian to learn more about heart-healthy foods.  Limit sodium if directed by your health care provider. Sodium restriction may reduce symptoms of heart failure in some people. Talk to a dietitian to learn more about heart-healthy seasonings.  Use healthy cooking methods. Healthy cooking methods include roasting, grilling, broiling, baking, poaching, steaming, or stir-frying. Talk to a dietitian to learn more about healthy cooking methods.  Limit fluids if directed by your health care provider. Fluid restriction  may reduce symptoms of heart failure in some people.  Weigh yourself every day. Daily weights are important in the early recognition of excess fluid. You should weigh yourself every morning after you urinate and before you eat breakfast. Wear the same amount of clothing each time you weigh yourself. Record your daily weight. Provide your health  care provider with your weight record.  Monitor and record your blood pressure if directed by your health care provider.  Check your pulse if directed by your health care provider.  Lose weight if directed by your health care provider. Weight loss may reduce symptoms of heart failure in some people.  Stop smoking or chewing tobacco. Nicotine makes your heart work harder by causing your blood vessels to constrict. Do not use nicotine gum or patches before talking to your health care provider.  Keep all follow-up visits as directed by your health care provider. This is important.  Limit alcohol intake to no more than 1 drink per day for nonpregnant women and 2 drinks per day for men. One drink equals 12 ounces of beer, 5 ounces of wine, or 1 ounces of hard liquor. Drinking more than that is harmful to your heart. Tell your health care provider if you drink alcohol several times a week. Talk with your health care provider about whether alcohol is safe for you. If your heart has already been damaged by alcohol or you have severe heart failure, drinking alcohol should be stopped completely.  Stop illicit drug use.  Stay up-to-date with immunizations. It is especially important to prevent respiratory infections through current pneumococcal and influenza immunizations.  Manage other health conditions such as hypertension, diabetes, thyroid disease, or abnormal heart rhythms as directed by your health care provider.  Learn to manage stress.  Plan rest periods when fatigued.  Learn strategies to manage high temperatures. If the weather is extremely hot:  Avoid vigorous physical activity.  Use air conditioning or fans or seek a cooler location.  Avoid caffeine and alcohol.  Wear loose-fitting, lightweight, and light-colored clothing.  Learn strategies to manage cold temperatures. If the weather is extremely cold:  Avoid vigorous physical activity.  Layer clothes.  Wear mittens or  gloves, a hat, and a scarf when going outside.  Avoid alcohol.  Obtain ongoing education and support as needed.  Participate in or seek rehabilitation as needed to maintain or improve independence and quality of life. SEEK MEDICAL CARE IF:   Your weight increases by 03 lb/1.4 kg in 1 day or 05 lb/2.3 kg in a week.  You have increasing shortness of breath that is unusual for you.  You are unable to participate in your usual physical activities.  You tire easily.  You cough more than normal, especially with physical activity.  You have any or more swelling in areas such as your hands, feet, ankles, or abdomen.  You are unable to sleep because it is hard to breathe.  You feel like your heart is beating fast (palpitations).  You become dizzy or light-headed upon standing up. SEEK IMMEDIATE MEDICAL CARE IF:   You have difficulty breathing.  There is a change in mental status such as decreased alertness or difficulty with concentration.  You have a pain or discomfort in your chest.  You have an episode of fainting (syncope). MAKE SURE YOU:   Understand these instructions.  Will watch your condition.  Will get help right away if you are not doing well or get worse. Document Released: 08/14/2005 Document Revised:  12/29/2013 Document Reviewed: 09/13/2012 [employer] Patient Information 2015 [place], [place]. This information is not intended to replace advice given to you by your health care provider. Make sure you discuss any questions you have with your health care provider.

## 2014-07-31 ENCOUNTER — Telehealth: Payer: Self-pay | Admitting: *Deleted

## 2014-07-31 DIAGNOSIS — I493 Ventricular premature depolarization: Secondary | ICD-10-CM

## 2014-07-31 MED ORDER — POTASSIUM CHLORIDE ER 20 MEQ PO TBCR: 20.0000 meq | EXTENDED_RELEASE_TABLET | Freq: Every day | ORAL | Status: DC

## 2014-07-31 NOTE — Telephone Encounter (Signed)
pt notified about lab results and to start K+ 20 meq daily, BMET 12/8 when she comes in for echo. Pt agreeable to Plan of Care

## 2014-08-04 ENCOUNTER — Encounter: Payer: Self-pay | Admitting: Physician Assistant

## 2014-08-04 ENCOUNTER — Telehealth: Payer: Self-pay | Admitting: *Deleted

## 2014-08-04 ENCOUNTER — Ambulatory Visit (HOSPITAL_COMMUNITY): Payer: 59 | Attending: Cardiology | Admitting: Radiology

## 2014-08-04 ENCOUNTER — Other Ambulatory Visit (INDEPENDENT_AMBULATORY_CARE_PROVIDER_SITE_OTHER): Payer: 59 | Admitting: *Deleted

## 2014-08-04 DIAGNOSIS — R0600 Dyspnea, unspecified: Secondary | ICD-10-CM | POA: Diagnosis not present

## 2014-08-04 DIAGNOSIS — I493 Ventricular premature depolarization: Secondary | ICD-10-CM

## 2014-08-04 DIAGNOSIS — I509 Heart failure, unspecified: Secondary | ICD-10-CM

## 2014-08-04 LAB — BASIC METABOLIC PANEL
BUN: 23 mg/dL (ref 6–23)
CO2: 26 mEq/L (ref 19–32)
CREATININE: 1.2 mg/dL (ref 0.4–1.2)
Calcium: 9.1 mg/dL (ref 8.4–10.5)
Chloride: 104 mEq/L (ref 96–112)
GFR: 51.04 mL/min — ABNORMAL LOW (ref >60.00)
Glucose, Bld: 147 mg/dL — ABNORMAL HIGH (ref 70–99)
POTASSIUM: 3.4 meq/L — AB (ref 3.5–5.1)
SODIUM: 140 meq/L (ref 135–145)

## 2014-08-04 NOTE — Telephone Encounter (Signed)
I lmptcb on both her home # and cell # as well to go over lab results and to increase K+. I will try again tomorrow.

## 2014-08-04 NOTE — Progress Notes (Signed)
Echocardiogram performed.  

## 2014-08-05 MED ORDER — POTASSIUM CHLORIDE ER 20 MEQ PO TBCR: 40.0000 meq | EXTENDED_RELEASE_TABLET | Freq: Every day | ORAL | Status: DC

## 2014-08-05 NOTE — Telephone Encounter (Signed)
Pt rtnd my call and now has been notified about lab results as well as echo results and increase in K+ to 40 meq QD, bmet 12/18 when she comes in for myoview. Pt agreeable to Plan of Care.

## 2014-08-11 ENCOUNTER — Other Ambulatory Visit: Payer: Self-pay | Admitting: *Deleted

## 2014-08-11 MED ORDER — LOPERAMIDE HCL 2 MG PO CAPS: 2.0000 mg | ORAL_CAPSULE | Freq: Two times a day (BID) | ORAL | Status: DC | PRN

## 2014-08-14 ENCOUNTER — Other Ambulatory Visit (INDEPENDENT_AMBULATORY_CARE_PROVIDER_SITE_OTHER): Payer: 59 | Admitting: *Deleted

## 2014-08-14 ENCOUNTER — Ambulatory Visit (HOSPITAL_COMMUNITY): Payer: 59 | Attending: Cardiovascular Disease | Admitting: Radiology

## 2014-08-14 DIAGNOSIS — R0600 Dyspnea, unspecified: Secondary | ICD-10-CM | POA: Diagnosis present

## 2014-08-14 DIAGNOSIS — I10 Essential (primary) hypertension: Secondary | ICD-10-CM | POA: Insufficient documentation

## 2014-08-14 DIAGNOSIS — E119 Type 2 diabetes mellitus without complications: Secondary | ICD-10-CM | POA: Diagnosis not present

## 2014-08-14 DIAGNOSIS — I493 Ventricular premature depolarization: Secondary | ICD-10-CM

## 2014-08-14 LAB — BASIC METABOLIC PANEL
BUN: 18 mg/dL (ref 6–23)
CO2: 27 mEq/L (ref 19–32)
Calcium: 9.1 mg/dL (ref 8.4–10.5)
Chloride: 104 mEq/L (ref 96–112)
Creatinine, Ser: 1.1 mg/dL (ref 0.4–1.2)
GFR: 56.63 mL/min — AB (ref >60.00)
Glucose, Bld: 124 mg/dL — ABNORMAL HIGH (ref 70–99)
POTASSIUM: 3.7 meq/L (ref 3.5–5.1)
Sodium: 138 mEq/L (ref 135–145)

## 2014-08-14 MED ORDER — TECHNETIUM TC 99M SESTAMIBI GENERIC - CARDIOLITE
33.0000 | Freq: Once | INTRAVENOUS | Status: AC | PRN
  Administered 2014-08-14: 33 via INTRAVENOUS

## 2014-08-14 MED ORDER — TECHNETIUM TC 99M SESTAMIBI GENERIC - CARDIOLITE
11.0000 | Freq: Once | INTRAVENOUS | Status: AC | PRN
  Administered 2014-08-14: 11 via INTRAVENOUS

## 2014-08-14 NOTE — Progress Notes (Signed)
[facility] [address] [place], [place] [address] [phone]    Cardiology Nuclear Med Study  Dorothy [name] is a 58 y.o. female     MRN : [id number]     DOB: 1955/12/16  Procedure Date: 08/14/2014  Nuclear Med Background Indication for Stress Test:  Evaluation for Ischemia and [facility] History:  No Known CAD History Cardiac Risk Factors: Hypertension and NIDDM  Symptoms:  Chest Pain, DOE, Fatigue, Light-Headedness and SOB   Nuclear Pre-Procedure Caffeine/Decaff Intake:  8:30pm NPO After: 8:30pm   Lungs:  clear O2 Sat: 98% on room air. IV 0.9% NS with Angio Cath:  20g  IV Site: R Antecubital  IV Started by:  Ileene Hutchinson, EMT-P  Chest Size (in):  38 Cup Size: D  Height: 5\' 5"  (1.651 m)  Weight:  172 lb (78.019 kg)  BMI:  Body mass index is 28.62 kg/(m^2). Tech Comments:  Held  Lasix and Metformin this am.    Nuclear Med Study 1 or 2 day study: 1 day  Stress Test Type:  Stress  Reading MD: n/a  Order Authorizing Provider:  Cooper,MD  Resting Radionuclide: Technetium 67m Sestamibi  Resting Radionuclide Dose: 11.0 mCi   Stress Radionuclide:  Technetium 36m Sestamibi  Stress Radionuclide Dose: 33.0 mCi           Stress Protocol Rest HR: 52 Stress HR: 155  Rest BP: 142/79 Stress BP: 149/91  Exercise Time (min): 4:33 METS: 6.4           Dose of Adenosine (mg):  n/a Dose of Lexiscan: n/a mg  Dose of Atropine (mg): n/a Dose of Dobutamine: n/a mcg/kg/min (at max HR)  Stress Test Technologist: Glade Lloyd, BS-ES  Nuclear Technologist:  Earl Many, CNMT     Rest Procedure:  Myocardial perfusion imaging was performed at rest 45 minutes following the intravenous administration of Technetium 52m Sestamibi. Rest ECG: NSR - Normal EKG  Stress Procedure:  The patient exercised on the treadmill utilizing the Bruce Protocol for 4:33 minutes. The patient stopped due to SOB and 6/10 chest tightness that resolved in recovery.   Technetium 97m Sestamibi was injected at peak exercise and myocardial perfusion imaging was performed after a brief delay.  Stress ECG: Insignificant upsloping ST segment depression.  QPS Raw Data Images:  Normal; no motion artifact; normal heart/lung ratio. Stress Images:  Normal homogeneous uptake in all areas of the myocardium. Rest Images:  Normal homogeneous uptake in all areas of the myocardium. Subtraction (SDS):  No evidence of ischemia. Transient Ischemic Dilatation (Normal <1.22):  0.92 Lung/Heart Ratio (Normal <0.45):  0.35  Quantitative Gated Spect Images QGS EDV:  64 ml QGS ESV:  17 ml  Impression Exercise Capacity:  Poor exercise capacity. BP Response:  Normal blood pressure response. Clinical Symptoms:  Mild chest pain/dyspnea. ECG Impression:  Insignificant upsloping ST segment depression. Comparison with Prior Nuclear Study: No previous nuclear study performed  Overall Impression:  Low risk stress nuclear study. Poor exercise tolerance. Patient reached her target heart rate quickly. No ischemia by perfusion imaging. Normal LV systolic function with no wall motion abnormalities.  LV Ejection Fraction: 73%.  LV Wall Motion:  NL LV Function; NL Wall Motion  Darlin Coco  MD

## 2014-08-17 ENCOUNTER — Encounter: Payer: Self-pay | Admitting: Physician Assistant

## 2014-08-17 ENCOUNTER — Telehealth: Payer: Self-pay | Admitting: *Deleted

## 2014-08-17 NOTE — Telephone Encounter (Signed)
pt notified about lab and myoview results with verbal understanding. Pt has appt 12/22 w/Scott W. PA

## 2014-08-18 ENCOUNTER — Encounter: Payer: Self-pay | Admitting: Physician Assistant

## 2014-08-18 ENCOUNTER — Ambulatory Visit (INDEPENDENT_AMBULATORY_CARE_PROVIDER_SITE_OTHER): Payer: 59 | Admitting: Physician Assistant

## 2014-08-18 VITALS — BP 138/83 | HR 64 | Ht 65.0 in | Wt 173.0 lb

## 2014-08-18 DIAGNOSIS — I5032 Chronic diastolic (congestive) heart failure: Secondary | ICD-10-CM

## 2014-08-18 DIAGNOSIS — E785 Hyperlipidemia, unspecified: Secondary | ICD-10-CM

## 2014-08-18 NOTE — Progress Notes (Signed)
Cardiology Office Note   Date:  08/18/2014   ID:  Dorothy [name], DOB [birth year], MRN [id number]  PCP:  Reginia Forts, MD  Cardiologist:  Dr. Liam Rogers >> Dr. Sherren Mocha (patient request)   History of Present Illness: Dorothy [name] is a 58 y.o. female with a hx of DM2, HL, FHx of CAD, edema, Meniere's disease.  She was evaluated by Dr. Liam Rogers in 11/2013 for dyspnea and chest pain.  She had a CT of the chest in 10/2013 that was normal.  Symptoms were felt to be atypical.  No further cardiac testing was indicated.  She presented to the ED 07/27/14 with DOE.  BNP was elevated and she was started on Lasix.  I saw her in follow-up. She was feeling better. BNP was normal. Echocardiogram was obtained and demonstrated normal LV function. Myoview was obtained and demonstrated no ischemia. She was maintained on a low dose of Lasix. She returns for follow-up. She is doing well. She does note dyspnea with exertion. She is NYHA 2-2b. She denies orthopnea, PND or edema. She denies syncope or chest pain.   ETT-Myoview (12/15): Overall Impression: Low risk stress nuclear study. Poor exercise tolerance. Patient reached her target heart rate quickly. No ischemia by perfusion imaging. Normal LV systolic function with no wall motion abnormalities.  LV Ejection Fraction: 73%.  Echocardiogram (12/15):   Study Conclusions - Left ventricle: The cavity size was normal. Wall thickness was normal.  Systolic function was normal. The estimated ejection fraction was in the range of 60% to 65%. Wall motion was normal; there were no regional wall motion abnormalities. Left ventricular diastolic function parameters were normal. - Aortic valve: Structurally normal valve. Trileaflet; normal thickness leaflets. Transvalvular velocity was within the normal range. There was no stenosis. There was no regurgitation. - Aortic root: The aortic root was normal in size. - Ascending aorta: The ascending aorta was  normal in size. - Mitral valve: Structurally normal valve. There was trivial regurgitation. - Right ventricle: The cavity size was mildly dilated. Wall thickness was normal. - Right atrium: The atrium was normal in size. - Tricuspid valve: There was no regurgitation. - Pulmonary arteries: Systolic pressure was within the normal range. - Inferior vena cava: The vessel was normal in size. The respirophasic diameter changes were in the normal range (>= 50%), consistent with normal central venous pressure. - Pericardium, extracardiac: There was no pericardial effusion.  Impressions: - Normal study.   Recent Labs: 01/12/2014: TSH 4.075 07/15/2014: ALT 24; LDL (calc) 151* 07/27/2014: Hemoglobin 12.0 07/30/2014: Pro B Natriuretic peptide (BNP) 31.0 08/14/2014: BUN 18; Creatinine 1.1; Potassium 3.7; Sodium 138    Recent Radiology: Dg Chest 2 View  07/27/2014     IMPRESSION: 1. Cardiomegaly and pulmonary venous congestion without definite evidence of edema. 2. Slight worsening bibasilar atelectasis, left greater than right.   Electronically Signed   By: Sandi Mariscal M.D.   On: 07/27/2014 14:39     Wt Readings from Last 3 Encounters:  08/18/14 173 lb (78.472 kg)  08/14/14 172 lb (78.019 kg)  07/30/14 176 lb (79.833 kg)     Past Medical History  Diagnosis Date  . Fatty liver   . GERD (gastroesophageal reflux disease)   . Gastritis   . Hyperlipidemia   . Depression   . Anxiety   . Choledocholithiasis   . Hx of adenomatous colonic polyps   . Diabetes mellitus   . Migraine   . Osteoarthritis   . Peptic ulcer, unspecified  site, unspecified as acute or chronic, without mention of hemorrhage, perforation, or obstruction   . Otosclerosis, unspecified   . B12 deficiency   . Hematuria, unspecified   . Unspecified disorder of kidney and ureter   . Osteoporosis, unspecified   . Meniere's disease, unspecified   . Benign paroxysmal positional vertigo   . Symptomatic menopausal or  female climacteric states   . Allergic rhinitis, cause unspecified   . Fatty liver 09/04/12  . Hiatal hernia   . Fibromyalgia   . Mononeuritis of unspecified site   . Hx of echocardiogram     Echo (12/15):  EF 60-65%, no RWMA, normal diast function, mild RVE  . Hx of cardiovascular stress test     ETT-Myoview (12/15):  No ischemia.  No ECG changes.  EF 73%.  Low Risk.     Current Outpatient Prescriptions  Medication Sig Dispense Refill  . aspirin 81 MG tablet Take 81 mg by mouth every evening.    . Calcium Carb-Cholecalciferol (CALCIUM 500 +D) 500-400 MG-UNIT TABS Take 1 tablet by mouth daily.     . Cholecalciferol (VITAMIN D3) 2000 UNITS TABS Take 2,000 Units by mouth daily.     . Cyanocobalamin (VITAMIN B 12 PO) Take 1,500 mg by mouth daily.    [name] dexlansoprazole (DEXILANT) 60 MG capsule Take 1 capsule (60 mg total) by mouth daily. 30 capsule 11  . estrogens, conjugated, (PREMARIN) 0.45 MG tablet Take 1 tablet (0.45 mg total) by mouth daily. 30 tablet 11  . fluticasone (FLONASE) 50 MCG/ACT nasal spray Place 2 sprays into both nostrils daily. (Patient taking differently: Place 2 sprays into both nostrils daily as needed for allergies. ) 16 g 11  . furosemide (LASIX) 20 MG tablet Take 1 tablet (20 mg total) by mouth daily. 30 tablet 6  . loperamide (IMODIUM) 2 MG capsule Take 1 capsule (2 mg total) by mouth 2 (two) times daily as needed for diarrhea or loose stools. 60 capsule 0  . metFORMIN (GLUCOPHAGE) 1000 MG tablet Take 1 tablet (1,000 mg total) by mouth 2 (two) times daily with a meal. 60 tablet 8  . omega-3 acid ethyl esters (LOVAZA) 1 G capsule Take 1 g by mouth 2 (two) times daily.    . Potassium Chloride ER 20 MEQ TBCR Take 40 mEq by mouth daily. 60 tablet 11  . pregabalin (LYRICA) 50 MG capsule Take 50 mg by mouth 2 (two) times daily.    . ranitidine (ZANTAC) 300 MG tablet Take 1 tablet (300 mg total) by mouth at bedtime. 90 tablet 3  . rotigotine (NEUPRO) 2 MG/24HR Place 1 patch  onto the skin daily. 30 patch 5  . simvastatin (ZOCOR) 40 MG tablet Take 1 tablet (40 mg total) by mouth at bedtime. 30 tablet 11  . sitaGLIPtin (JANUVIA) 100 MG tablet Take 1 tablet (100 mg total) by mouth daily. 90 tablet 3  . sucralfate (CARAFATE) 1 GM/10ML suspension Take 10 mLs (1 g total) by mouth 4 (four) times daily -  with meals and at bedtime. 420 mL 2  . venlafaxine XR (EFFEXOR-XR) 75 MG 24 hr capsule Take 1 capsule (75 mg total) by mouth every evening. 30 capsule 11  . VOLTAREN 1 % GEL Apply 2 g topically 2 (two) times daily as needed (pain).      No current facility-administered medications for this visit.     Allergies:   Adhesive; Bactrim; Codeine; Keflex; Macrobid; Nitrofurantoin; Sulfa drugs cross reactors; and Talwin   Social History:  The patient  reports that she has never smoked. She has never used smokeless tobacco. She reports that she drinks alcohol. She reports that she does not use illicit drugs.   Family History:  The patient's family history includes Breast cancer in her maternal aunt; COPD in her father, mother, paternal grandfather, and paternal grandmother; Cancer in her father and mother; Colon cancer in her cousin; Diabetes in her brother, father, mother, and paternal grandmother; Heart attack in her brother, father, and paternal grandfather; Heart disease in her maternal grandfather; Heart disease (age of onset: 54) in her father; Hyperlipidemia in her mother; Hypertension in her brother and mother; Lung disease in her father; Obesity in her brother; Prostate cancer in her father; Stroke in her maternal grandmother.    ROS:  Please see the history of present illness.      All other systems reviewed and negative.    PHYSICAL EXAM: VS:  BP 138/83 mmHg  Pulse 64  Ht 5\' 5"  (1.651 m)  Wt 173 lb (78.472 kg)  BMI 28.79 kg/m2 Well nourished, well developed, in no acute distress HEENT: normal Neck: no JVD Cardiac:  normal S1, S2;  RRR; no murmur   Lungs:    clear to auscultation bilaterally, no wheezing, rhonchi or rales Abd: soft, nontender, no hepatomegaly Ext: no edema Skin: warm and dry Neuro:  CNs 2-12 intact, no focal abnormalities noted   ASSESSMENT AND PLAN:  1.  Chronic Diastolic CHF:  Volume is stable on low-dose Lasix. She knows to avoid salt. Continue current therapy. 2.  Dyspnea:  Recent Myoview low risk. No further cardiac workup at this time. She may gradually increase her activity.  3.  HLD (hyperlipidemia):  Managed by PCP.  Continue statin. Continue aspirin for primary prevention. 4.  Type 2 diabetes mellitus without complication:  62/13/0865: Hemoglobin-A1c 6.6.  Good control.  FU with PCP.   Disposition:   Follow-up with Dr. Burt Knack in 6 months.    Signed, Versie Starks, MHS 08/18/2014 4:01 PM    [facility] [place], [place], [place]  [address] Phone: [phone]; Fax: [phone]

## 2014-08-18 NOTE — Patient Instructions (Signed)
Your physician recommends that you schedule a follow-up appointment in: [place].   Your physician recommends that you continue on your current medications as directed. Please refer to the Current Medication list given to you today.

## 2014-09-15 ENCOUNTER — Other Ambulatory Visit: Payer: Self-pay

## 2014-09-15 DIAGNOSIS — Z1231 Encounter for screening mammogram for malignant neoplasm of breast: Secondary | ICD-10-CM

## 2014-10-12 ENCOUNTER — Ambulatory Visit: Payer: 59 | Admitting: Family Medicine

## 2014-10-12 ENCOUNTER — Telehealth: Payer: Self-pay | Admitting: *Deleted

## 2014-10-12 ENCOUNTER — Telehealth: Payer: Self-pay | Admitting: Internal Medicine

## 2014-10-12 MED ORDER — LOPERAMIDE HCL 2 MG PO CAPS: 2.0000 mg | ORAL_CAPSULE | Freq: Two times a day (BID) | ORAL | Status: DC | PRN

## 2014-10-12 NOTE — Telephone Encounter (Signed)
Sent Rx for Loperamide (IMODIUM), 2 mg, #60, one refill to [facility] off [facility] on 10/12/14. Patient has an appointment with Dr. Olevia Perches on 11/11/14 at 8:15 am.

## 2014-10-14 NOTE — Telephone Encounter (Signed)
error 

## 2014-11-11 ENCOUNTER — Other Ambulatory Visit: Payer: 59

## 2014-11-11 ENCOUNTER — Encounter: Payer: Self-pay | Admitting: Internal Medicine

## 2014-11-11 ENCOUNTER — Ambulatory Visit (INDEPENDENT_AMBULATORY_CARE_PROVIDER_SITE_OTHER): Payer: 59 | Admitting: Internal Medicine

## 2014-11-11 VITALS — BP 140/82 | HR 68 | Ht 64.5 in | Wt 170.2 lb

## 2014-11-11 DIAGNOSIS — R197 Diarrhea, unspecified: Secondary | ICD-10-CM

## 2014-11-11 DIAGNOSIS — K2970 Gastritis, unspecified, without bleeding: Secondary | ICD-10-CM

## 2014-11-11 DIAGNOSIS — K2990 Gastroduodenitis, unspecified, without bleeding: Secondary | ICD-10-CM

## 2014-11-11 MED ORDER — LOPERAMIDE HCL 2 MG PO CAPS: 2.0000 mg | ORAL_CAPSULE | Freq: Two times a day (BID) | ORAL | Status: DC | PRN

## 2014-11-11 NOTE — Patient Instructions (Addendum)
We have sent the following medications to your pharmacy for you to pick up at your convenience:  [facility] has requested that you go to the basement for the following lab work before leaving today:  Celiac sprue  Dr Reginia Forts

## 2014-11-11 NOTE — Progress Notes (Signed)
Dorothy [name] [birth year] [id number]  Note: This dictation was prepared with Dragon digital system. Any transcriptional errors that result from this procedure are unintentional.   History of Present Illness: This is a 59 year old white female with history of positive H. pylori antibody. Gastric   biopsies indicated reactive gastropathy. Negative for H.Pylori. Prior endoscopy was in 2008. She is currently on Dexilant 60 mg in the morning and ranitidine 300 mg at bedtime. Her symptoms are much improved The patient has a history of of lap cholecystectomy .Last abdominal ultrasound in 2014 revealed 7.5 mm common bile duct and fatty liver.She is having diarrhea controlled with Imodium 2 mg daily . She is also on metformin thousand milligram twice a day which has been contributing to the diarrhea  She is up-to-date on colonoscopy which was done in November 2011 and prior to that in 2006 with findings of hyperplastic polyp and most recently 2 polyps which confirmed onlypolypoid mucosa.    Past Medical History  Diagnosis Date  . Fatty liver   . GERD (gastroesophageal reflux disease)   . Gastritis   . Hyperlipidemia   . Depression   . Anxiety   . Choledocholithiasis   . Hx of adenomatous colonic polyps   . Diabetes mellitus   . Migraine   . Osteoarthritis   . Peptic ulcer, unspecified site, unspecified as acute or chronic, without mention of hemorrhage, perforation, or obstruction   . Otosclerosis, unspecified   . B12 deficiency   . Hematuria, unspecified   . Unspecified disorder of kidney and ureter   . Osteoporosis, unspecified   . Meniere's disease, unspecified   . Benign paroxysmal positional vertigo   . Symptomatic menopausal or female climacteric states   . Allergic rhinitis, cause unspecified   . Fatty liver 09/04/12  . Hiatal hernia   . Fibromyalgia   . Mononeuritis of unspecified site   . Hx of echocardiogram     Echo (12/15):  EF 60-65%, no RWMA, normal diast function, mild RVE   . Hx of cardiovascular stress test     ETT-Myoview (12/15):  No ischemia.  No ECG changes.  EF 73%.  Low Risk.     Past Surgical History  Procedure Laterality Date  . Cholecystectomy    . Tubal ligation    . Shoulder surgery      left  . Breast enhancement surgery Bilateral   . Foot surgery      right  . Mouth surgery    . Bile duct exploration      gallstone removed  . Total abdominal hysterectomy      ovaries intact  . 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 Adhesive [Tape] Other (See Comments)    Tears skins  . Bactrim [Sulfamethoxazole-Trimethoprim] Other (See Comments)    blisters  . Codeine Itching  . Keflex [Cephalexin]   . Macrobid [Nitrofurantoin Macrocrystal] Other (See Comments)    blisters  . Nitrofurantoin Nausea And Vomiting and Other (See Comments)    Blisters   . Sulfa Drugs Cross Reactors Itching  . Talwin [Pentazocine] Nausea And Vomiting and Rash    Muscle cramps and vision changes    Family history and social history have been reviewed.  Review of Systems: Positive for diarrhea. Weight has been stable. Denies nausea  vomiting and heartburn  The remainder of the 10 point ROS is negative except as outlined in the H&P  Physical Exam: General Appearance Well developed, in no distress Eyes  Non icteric  HEENT  Non traumatic, normocephalic  Mouth No lesion, tongue papillated, no cheilosis Neck Supple without adenopathy, thyroid not enlarged, no carotid bruits, no JVD Lungs Clear to auscultation bilaterally COR Normal S1, normal S2, regular rhythm, no murmur, quiet precordium Abdomen Soft mildly tender in left lower quadrant and in epigastrium. Normoactive bowel sounds. Liver edge at costal margin  Rectal Not done  Extremities  No pedal  edema Skin No lesions Neurological Alert and oriented x 3 Psychological Normal mood and affect  Assessment and Plan:   60 year old white female diabetic with postcholecystectomy diarrhea contributed by metformin. We will refill Imodium. Another possibility would be use cholestyramine  But she prefers Imodium. He is up-to-date on colonoscopy.  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 She may discontinue ranitidine and see if she can get by on PPI only. She will also hold Carafate at this point there may always use it on when necessary basis We will be checking sprue profile today    Delfin Edis 11/11/2014

## 2014-11-12 LAB — GLIA (IGA/G) + TTG IGA
GLIADIN IGA: 3 U (ref <20)
GLIADIN IGG: 2 U (ref <20)
TISSUE TRANSGLUTAMINASE AB, IGA: 1 U/mL (ref <4)

## 2014-12-02 ENCOUNTER — Ambulatory Visit: Payer: Self-pay | Admitting: Family Medicine

## 2014-12-10 ENCOUNTER — Encounter: Payer: Self-pay | Admitting: Neurology

## 2014-12-10 ENCOUNTER — Ambulatory Visit (INDEPENDENT_AMBULATORY_CARE_PROVIDER_SITE_OTHER): Payer: 59 | Admitting: Neurology

## 2014-12-10 VITALS — BP 126/81 | HR 67 | Resp 16 | Ht 65.5 in | Wt 168.0 lb

## 2014-12-10 DIAGNOSIS — G2581 Restless legs syndrome: Secondary | ICD-10-CM

## 2014-12-10 DIAGNOSIS — I5030 Unspecified diastolic (congestive) heart failure: Secondary | ICD-10-CM

## 2014-12-10 DIAGNOSIS — M79671 Pain in right foot: Secondary | ICD-10-CM | POA: Diagnosis not present

## 2014-12-10 DIAGNOSIS — G4761 Periodic limb movement disorder: Secondary | ICD-10-CM

## 2014-12-10 DIAGNOSIS — M79672 Pain in left foot: Secondary | ICD-10-CM | POA: Diagnosis not present

## 2014-12-10 MED ORDER — PRAMIPEXOLE DIHYDROCHLORIDE 0.125 MG PO TABS: ORAL_TABLET | ORAL | Status: DC

## 2014-12-10 NOTE — Progress Notes (Signed)
Subjective:    Patient ID: Dorothy [name] is a 59 y.o. female.  HPI     Interim history:  Dorothy [name] is a 59 year old right-handed woman with an underlying complex medical history of diabetes, hyperlipidemia, depression, fatty liver, reflux disease, anxiety, gallstones, migraine headaches, osteoarthritis, peptic ulcer disease, vitamin B12 deficiency, Mnire's disease and benign positional vertigo, allergic rhinitis, hiatal hernia and fibromyalgia, who presents for followup consultation of her RLS. She is unaccompanied today. I last saw her on 06/09/2014, at which time she reported doing better with Neupro. She was on the 2 mg patch. She had no side effects and felt she was sleeping much better. She reported that her little dog unfortunately found one of her old patches that fell off while she was changing and chewed on it and apparently had a overdose type reaction to it. She had to rush him to the vet. Since then she was very careful in removing and applying the patch. She noticed that one time when she forgot to change the patch she had a flareup of her RLS symptoms. She was taking iron supplements. She was trying to lose weight and joined a gym. She was on a second diabetes medication per primary care physician.   Today, 12/10/2014: She reports that she had new congestive heart failure, diagnosed in November 2015. She had developed some shortness of breath about 6 months prior and had seen a cardiologist. She gradually had worsening shortness of breath and on 07/27/2014 had to go to the emergency room because of acute exacerbation of shortness of breath. She was diagnosed with congestive heart failure. She had a CXR 2 views on 07/27/14: 1. Cardiomegaly and pulmonary venous congestion without definite evidence of edema. 2. Slight worsening bibasilar atelectasis, left greater than right. she was scheduled with cardiology in follow-up. She had a nuclear stress test on 08/14/2014 which was negative for  skin you. She had an echocardiogram on 08/04/2014: EF was 60-65%, wall motion was normal, all in all it was a normal study. She was started on Lasix and was able to lose several fluid pounds. She had developed lower extremity edema which improved. Her breathing improved. As far as her RLS, unfortunately she had started noticing itching and burning at the site of the patches and while the neupro was still helpful, the site reactions were troublesome. She started reducing the time the patch was on down from 24 hours to about 8 hours per day. She noticed that her restless leg symptoms flared up. She is still taking a iron supplement. She would like to discuss a different medication. Her lower extremity swelling is better. She has no chest pain. She never actually had chest pain.  she's had bilateral foot pain. She has issues with bunion bilaterally. She's planning to see a podiatrist.  Previously:   I saw her on 01/09/2014, at which time she reported significant RLS symptoms and difficulty with sleep onset as well as sleep maintenance. Sleep issues have been a long-standing problem for her. She also reported a recent cardiac workup and other than PVCs there were no abnormalities found. I talked her about her sleep test results in detail. I suggested we check iron studies. Her ferritin level was 10 and we called her with the test results. I advised her to start an over-the-counter iron supplement. Her hemoglobin and hematocrit were slightly low which were checked by her primary care provider a few days later. I also suggested she start Neupro patch for symptomatic treatment  of RLS. I started her on 1 mg strength with increased to 2 mg. She has been diagnosed with diabetes in the interim. She follows with Dr. Estanislado Pandy for arthritis.    I first met her on 11/13/2013 at the request of her primary care physician, at which time she reported intermittent shortness of breath, palpitations, indigestion, and excessive  daytime somnolence, with a need for daily nap after work between 2 and 6 PM. She also reported restless leg symptoms and jerking in her sleep. She reported having had a sleep study many years ago which she recalled showed snoring. 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 She has braces on top, which may come out soon. 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 She is a non-smoker and drinks alcohol very occasionally and does not drink caffeine. She lost about 15 lb from the jaw surgery, but has mostly gained it back. I suggested she return for sleep study.   She she had a baseline sleep study on 12/17/2013: Sleep efficiency was reduced at 62.9% latency to sleep of 73 minutes and wake after sleep onset of 77 minutes with moderate to severe sleep fragmentation noted. She had an elevated arousal index of 28.5 per hour primarily because of periodic leg movements. She had a normal percentage of stage I sleep, a markedly increased percentage of stage II sleep, and absence of slow-wave and REM sleep. She had severe periodic leg movements of sleep. Index was 76.9 per hour with an associated arousal index of 21.5 per hour. She had no significant EKG changes. Minimal intermittent snoring was noted. She slept mostly in the lateral positions. She had a total AHI of 4 per hour, rising to 25.4 per hour in the supine position. Baseline oxygen saturation was 95%, nadir was 90%. I felt that the absence of REM sleep and her estimated her potential underlying sleep disorder breathing. I felt that her Effexor may be a contributor to her restless leg symptoms and PLMS.   Her Past Medical History Is Significant For: Past Medical History  Diagnosis Date  . Fatty liver   . GERD (gastroesophageal reflux disease)   . Gastritis   . Hyperlipidemia   . Depression   . Anxiety   . Choledocholithiasis   . Hx of  adenomatous colonic polyps   . Diabetes mellitus   . Migraine   . Osteoarthritis   . Peptic ulcer, unspecified site, unspecified as acute or chronic, without mention of hemorrhage, perforation, or obstruction   . Otosclerosis, unspecified   . B12 deficiency   . Hematuria, unspecified   . Unspecified disorder of kidney and ureter   . Osteoporosis, unspecified   . Meniere's disease, unspecified   . Benign paroxysmal positional vertigo   . Symptomatic menopausal or female climacteric states   . Allergic rhinitis, cause unspecified   . Fatty liver 09/04/12  . Hiatal hernia   . Fibromyalgia   . Mononeuritis of unspecified site   . Hx of echocardiogram     Echo (12/15):  EF 60-65%, no RWMA, normal diast function, mild RVE  . Hx of cardiovascular stress test     ETT-Myoview (12/15):  No ischemia.  No ECG changes.  EF 73%.  Low Risk.   . Congestive heart failure     Her Past Surgical History Is Significant For: Past Surgical History  Procedure Laterality Date  . Cholecystectomy    .  Tubal ligation    . Shoulder surgery      left  . Breast enhancement surgery Bilateral   . Foot surgery      right  . Mouth surgery    . Bile duct exploration      gallstone removed  . Total abdominal hysterectomy      ovaries intact  . 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 Colon cancer Cousin     died age 43  . Prostate cancer Father   . Lung disease Father   . Diabetes Father   . Heart disease Father 42    CHF, AMI multiple/CABG  . COPD Father   . Cancer Father     prostate cancer  . Breast cancer Maternal Aunt   . Diabetes Mother   . Hypertension Mother   . Hyperlipidemia Mother   . COPD Mother   . Cancer Mother      lung cancer  . Diabetes Brother   . Hypertension Brother   . Obesity Brother   . Stroke Maternal Grandmother   . Heart disease Maternal Grandfather   . COPD Paternal Grandmother   . Diabetes Paternal Grandmother   . COPD Paternal Grandfather   . Heart attack Father   . Heart attack Paternal Grandfather   . Heart attack Brother     Her Social History Is Significant For: History   Social History  . Marital Status: Married    Spouse Name: Richardson Landry  . Number of Children: 3  . 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 Smoking status: Never Smoker   . Smokeless tobacco: Never Used  . Alcohol Use: 0.0 oz/week    0 Standard drinks or equivalent per week     Comment: occasional 1 x month  . Drug Use: No  . Sexual Activity: Yes   Other Topics Concern  . None   Social History Narrative   Always uses seat belts. Smoke alarm and carbon monoxide detector in the home.No caffeine use. No unsecured guns in the home.    Marital status:  Married x 18 years;Happily,no abuse. 2nd marriage.      Children: 3 sons (46, 48, 54); 3 grandchildren; 3 step grandchildren; 1 gg.      Lives: with husband, son.      Employment:  Working 25-30 hours per week at [employer]; loves job.      Tobacco:none       Alcohol:         Drugs:      Exercise: Inactive/none.      Seatbelt: 100%      Guns: none    Her Allergies Are:  Allergies  Allergen Reactions  . Adhesive [Tape] Other (See Comments)    Tears skins  . Bactrim [Sulfamethoxazole-Trimethoprim] Other (See Comments)    blisters  . Codeine Itching  . Keflex [Cephalexin]   . Macrobid [Nitrofurantoin Macrocrystal] Other (See Comments)    blisters  . Nitrofurantoin Nausea And Vomiting and Other (See Comments)    Blisters   . Sulfa Drugs Cross Reactors Itching  . Talwin [Pentazocine] Nausea And Vomiting and  Rash    Muscle cramps and vision changes  :    Her Current Medications Are:  Outpatient Encounter Prescriptions as of 12/10/2014  Medication Sig  . aspirin 81 MG tablet Take 81 mg by mouth every evening.  . Calcium Carb-Cholecalciferol (CALCIUM 500 +D) 500-400 MG-UNIT TABS Take 1 tablet by mouth daily.   . Cholecalciferol (VITAMIN D3) 2000 UNITS TABS Take 2,000 Units by mouth daily.   . Cyanocobalamin (VITAMIN B 12 PO) Take 1,500 mg by mouth daily.  [name] dexlansoprazole (DEXILANT) 60 MG capsule Take 1 capsule (60 mg total) by mouth daily.  [name] estrogens, conjugated, (PREMARIN) 0.45 MG tablet Take 1 tablet (0.45 mg total) by mouth daily.  . fluticasone (FLONASE) 50 MCG/ACT nasal spray Place 2 sprays into both nostrils daily. (Patient taking differently: Place 2 sprays into both nostrils daily as needed for allergies. )  . furosemide (LASIX) 20 MG tablet Take 1 tablet (20 mg total) by mouth daily.  [name] loperamide (IMODIUM) 2 MG capsule Take 1 capsule (2 mg total) by mouth 2 (two) times daily as needed for diarrhea or loose stools.  . metFORMIN (GLUCOPHAGE) 1000 MG tablet Take 1 tablet (1,000 mg total) by mouth 2 (two) times daily with a meal.  . omega-3 acid ethyl esters (LOVAZA) 1 G capsule Take 1 g by mouth 2 (two) times daily.  . Potassium Chloride ER 20 MEQ TBCR Take 40 mEq by mouth daily.  . pregabalin (LYRICA) 50 MG capsule Take 50 mg by mouth 2 (two) times daily.  . rotigotine (NEUPRO) 2 MG/24HR Place 1 patch onto the skin daily.  . simvastatin (ZOCOR) 40 MG tablet Take 1 tablet (40 mg total) by mouth at bedtime.  . sitaGLIPtin (JANUVIA) 100 MG tablet Take 1 tablet (100 mg total) by mouth daily.  [name] venlafaxine XR (EFFEXOR-XR) 75 MG 24 hr capsule Take 1 capsule (75 mg total) by mouth every evening.  [name] VOLTAREN 1 % GEL Apply 2 g topically 2 (two) times daily as needed (pain).   . [DISCONTINUED] ranitidine (ZANTAC) 300 MG tablet Take 1 tablet (300 mg total) by mouth at bedtime.  . [DISCONTINUED] sucralfate (CARAFATE) 1 GM/10ML suspension Take  10 mLs (1 g total) by mouth 4 (four) times daily -  with meals and at bedtime.  :  Review of Systems:  Out of a complete 14 point review of systems, all are reviewed and negative with the exception of these symptoms as listed below:   Review of Systems  Musculoskeletal:       Bilateral foot pain   Skin:       Medication patches cause burning and itching     Objective:  Neurologic Exam  Physical Exam Physical Examination:   Filed Vitals:   12/10/14 1451  BP: 126/81  Pulse: 67  Resp: 16     General Examination: The patient is a very pleasant 59 y.o. female in no acute distress. She appears well-developed and well-nourished and well groomed. she is in good spirits today.   HEENT: Normocephalic, atraumatic, pupils are equal, round and reactive to light and accommodation. Funduscopic exam is normal with sharp disc margins noted. Extraocular tracking is good without limitation to gaze excursion or nystagmus noted. Normal smooth pursuit is noted. Hearing is grossly intact. Face is symmetric with normal facial animation and normal facial sensation. Speech is clear with no dysarthria noted. There is no hypophonia. There is no lip, neck/head, jaw or voice tremor. Neck is supple with full range of passive  and active motion. There are no carotid bruits on auscultation. Oropharynx exam reveals: mild mouth dryness, adequate dental hygiene with upper braces in place and moderate airway crowding, due to larger and elongated uvula and narrow airway entry. Mallampati is class II. Tongue protrudes centrally and palate elevates symmetrically. Tonsils are 1+ in size.   Chest: Clear to auscultation without wheezing, rhonchi or crackles noted.  Heart: S1+S2+0, regular and normal without murmurs, rubs or gallops noted.   Abdomen: Soft, non-tender and non-distended with normal bowel sounds appreciated on auscultation.  Extremities: There is  trace puffiness around both ankles. She reports foot pain  bilaterally. She has some numbness around her big toe on the right.  Skin: Warm and dry without trophic changes noted. There are no varicose veins. she has mild residual redness there is visible in both upper arms from the patches. There is no acute swelling or angry-looking redness. But the sites of the patches are still visible.  Musculoskeletal: exam reveals no obvious joint deformities, tenderness or joint swelling or erythema.   Neurologically:  Mental status: The patient is awake, alert and oriented in all 4 spheres. Her immediate and remote memory, attention, language skills and fund of knowledge are appropriate. There is no evidence of aphasia, agnosia, apraxia or anomia. Speech is clear with normal prosody and enunciation. Thought process is linear. Mood is normal and affect is normal.  Cranial nerves II - XII are as described above under HEENT exam. In addition: shoulder shrug is normal with equal shoulder height noted. Motor exam: Normal bulk, strength and tone is noted. There is no drift, tremor or rebound. Romberg is negative. Reflexes are 2 to 3+ throughout. Babinski: Toes are flexor bilaterally. Fine motor skills and coordination: intact with normal finger taps, normal hand movements, normal rapid alternating patting, normal foot taps and normal foot agility.  Cerebellar testing: No dysmetria or intention tremor on finger to nose testing. Heel to shin is unremarkable bilaterally. There is no truncal or gait ataxia.  Sensory exam: intact to light touch, pinprick, vibration, temperature sense in the upper and lower extremities.  Gait, station and balance: She stands easily. No veering to one side is noted. No leaning to one side is noted. Posture is age-appropriate and stance is narrow based. Gait shows normal stride length and normal pace. No problems turning are noted. She turns en bloc. Tandem walk is unremarkable.           Assessment and Plan:   In summary, JONNELLE LAWNICZAK is a very  pleasant 59 year old female with an underlying complex medical history of diabetes, hyperlipidemia, depression, fatty liver, reflux disease, anxiety, gallstones, migraine headaches, osteoarthritis, peptic ulcer disease, vitamin B12 deficiency, Mnire's disease and benign positional vertigo, allergic rhinitis, hiatal hernia and fibromyalgia, who presents for followup consultation of her restless leg syndrome. In May 2015 I placed her on Neupro and she  titrated this up to 2 mg once daily with improvement in her RLS symptoms and better sleep but she developed itching and burning at the site of the patches. At this juncture, she has to stop taking the Neupro. We do not have to taper it because she already has reduced the time the patches have been on. I suggested we try her on low-dose Mirapex starting at 0.125 mg each night. She is advised to take it 90-120 minutes before her bedtime. We talked about potential side effects including sedation, nausea, lower extremity swelling, and sleepiness. She had a setback in November  with new diagnosis of congestive heart failure. Workup was reviewed and essentially cardiac workup was unremarkable. She has improved. She is on Lasix. I provided her with instructions and a new prescription for Mirapex. We will increase this gradually to up to 0.375 mg. I would like to see her back in 6 months, sooner if the need arises. Her exam is stable. For her foot pain she is planning to see a podiatrist. She has no evidence of neuropathy. I answered all her questions today and she was in agreement. she is encouraged to call with any interim questions or concerns or email me. She has a follow-up with cardiology in June. I spent 25 minutes in total face-to-face time with the patient, more than 50% of which was spent in counseling and coordination of care, reviewing test results, reviewing medication and discussing or reviewing the diagnosis of RLS, CHF, the prognosis and treatment options.

## 2014-12-10 NOTE — Patient Instructions (Signed)
We will have to stop the neupro patches due to itching and burning.   We will start Mirapex (generic name: pramipexole) 0.125 mg: Take 1 pill each night for 1 week, the 2 pills each night for 1 week, then 3 pills each night thereafter. Take 90 - 120 min before your bedtime. Common side effects reported are: Sedation, sleepiness, nausea, vomiting, and rare side effects are confusion, hallucinations, swelling in legs, and abnormal behaviors, including impulse control problems, which can manifest as excessive eating, obsessions with food or gambling, or hypersexuality.

## 2014-12-21 ENCOUNTER — Encounter: Payer: Self-pay | Admitting: Podiatry

## 2014-12-21 ENCOUNTER — Ambulatory Visit (INDEPENDENT_AMBULATORY_CARE_PROVIDER_SITE_OTHER): Payer: 59

## 2014-12-21 ENCOUNTER — Ambulatory Visit (INDEPENDENT_AMBULATORY_CARE_PROVIDER_SITE_OTHER): Payer: 59 | Admitting: Podiatry

## 2014-12-21 VITALS — BP 140/83 | HR 66 | Resp 12 | Ht 65.0 in | Wt 165.0 lb

## 2014-12-21 DIAGNOSIS — M79671 Pain in right foot: Secondary | ICD-10-CM

## 2014-12-21 DIAGNOSIS — M21612 Bunion of left foot: Secondary | ICD-10-CM

## 2014-12-21 DIAGNOSIS — M779 Enthesopathy, unspecified: Secondary | ICD-10-CM

## 2014-12-21 DIAGNOSIS — M2012 Hallux valgus (acquired), left foot: Secondary | ICD-10-CM

## 2014-12-21 DIAGNOSIS — M79672 Pain in left foot: Secondary | ICD-10-CM | POA: Diagnosis not present

## 2014-12-21 NOTE — Patient Instructions (Signed)
Pre-Operative Instructions  Congratulations, you have decided to take an important step to improving your quality of life.  You can be assured that the doctors of [facility] will be with you every step of the way.  1. Plan to be at the surgery center/hospital at least 1 (one) hour prior to your scheduled time unless otherwise directed by the surgical center/hospital staff.  You must have a responsible adult accompany you, remain during the surgery and drive you home.  Make sure you have directions to the surgical center/hospital and know how to get there on time. 2. For hospital based surgery you will need to obtain a history and physical form from your family physician within 1 month prior to the date of surgery- we will give you a form for you primary physician.  3. We make every effort to accommodate the date you request for surgery.  There are however, times where surgery dates or times have to be moved.  We will contact you as soon as possible if a change in schedule is required.   4. No Aspirin/Ibuprofen for one week before surgery.  If you are on aspirin, any non-steroidal anti-inflammatory medications (Mobic, Aleve, Ibuprofen) you should stop taking it 7 days prior to your surgery.  You make take Tylenol  For pain prior to surgery.  5. Medications- If you are taking daily heart and blood pressure medications, seizure, reflux, allergy, asthma, anxiety, pain or diabetes medications, make sure the surgery center/hospital is aware before the day of surgery so they may notify you which medications to take or avoid the day of surgery. 6. No food or drink after midnight the night before surgery unless directed otherwise by surgical center/hospital staff. 7. No alcoholic beverages 24 hours prior to surgery.  No smoking 24 hours prior to or 24 hours after surgery. 8. Wear loose pants or shorts- loose enough to fit over bandages, boots, and casts. 9. No slip on shoes, sneakers are best. 10. Bring  your boot with you to the surgery center/hospital.  Also bring crutches or a walker if your physician has prescribed it for you.  If you do not have this equipment, it will be provided for you after surgery. 11. If you have not been contracted by the surgery center/hospital by the day before your surgery, call to confirm the date and time of your surgery. 12. Leave-time from work may vary depending on the type of surgery you have.  Appropriate arrangements should be made prior to surgery with your employer. 13. Prescriptions will be provided immediately following surgery by your doctor.  Have these filled as soon as possible after surgery and take the medication as directed. 14. Remove nail polish on the operative foot. 15. Wash the night before surgery.  The night before surgery wash the foot and leg well with the antibacterial soap provided and water paying special attention to beneath the toenails and in between the toes.  Rinse thoroughly with water and dry well with a towel.  Perform this wash unless told not to do so by your physician.  Enclosed: 1 Ice pack (please put in freezer the night before surgery)   1 Hibiclens skin cleaner   Pre-op Instructions  If you have any questions regarding the instructions, do not hesitate to call our office.  [place]: [address] [place], [place] [address] [phone]  [place]: [address] [place], [place] [address] [phone]  [place]: [address]  [place], [place] [address] [phone]  Dr. Richard   Tuchman DPM, Dr. Norman Regal DPM Dr. Richard Sikora DPM, Dr. M. Todd Hyatt DPM, Dr. Kathryn Egerton DPM 

## 2014-12-21 NOTE — Progress Notes (Signed)
   Subjective:    Patient ID: Dorothy [name], female    DOB: [birth year], 59 y.o.   MRN: [id number]  HPI Comments: Pt states Dr. Paulla Dolly told her to come back when the bunion began to bother her, and it has begun to gradually worsen over the last 3-4 months.  Pt states she has a dull, achy pain in the right 3-4 bones with swelling and redness, for 3 weeks.  Foot Injury       Review of Systems  HENT: Positive for congestion, sinus pressure and tinnitus.   Musculoskeletal: Positive for myalgias, back pain and arthralgias.  All other systems reviewed and are negative.      Objective:   Physical Exam        Assessment & Plan:

## 2014-12-21 NOTE — Progress Notes (Signed)
Subjective:     Patient ID: Dorothy [name], female   DOB: 05-Oct-1955, 59 y.o.   MRN: [id number]  HPI patient presents stating the bunion on my left foot has really started to bother me and I get pain on top of my right foot that I wanted to have checked. I know I need to get this bunion fixed   Review of Systems  All other systems reviewed and are negative.      Objective:   Physical Exam  Constitutional: She is oriented to person, place, and time.  Cardiovascular: Intact distal pulses.   Musculoskeletal: Normal range of motion.  Neurological: She is oriented to person, place, and time.  Skin: Skin is warm.  Nursing note and vitals reviewed.  neurovascular status found to be intact with muscle strength adequate and range of motion subtalar midtarsal joint within normal limits. Patient's A1c is been normal and patient noted to have good digital perfusion and is well oriented 3. 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 Reviewed x-rays of the right foot also today. At this time I allowed her to read a consent form for correction of the left foot explaining to her all alternative treatments and complications as listed on the consent form. She understands is no guarantee as far success of surgery and the told recovery. Can take 6 months to one year and signs consent form after review. Patient is scheduled for outpatient surgery and at this time was given all instructions. I also discussed  with her cam walker usage and dispensed today and she will start with the right one and then go to the left for after surgery which will be done in 2 weeks

## 2014-12-23 ENCOUNTER — Encounter: Payer: Self-pay | Admitting: Internal Medicine

## 2015-01-05 ENCOUNTER — Encounter: Payer: Self-pay | Admitting: Podiatry

## 2015-01-05 DIAGNOSIS — M2012 Hallux valgus (acquired), left foot: Secondary | ICD-10-CM | POA: Diagnosis not present

## 2015-01-08 ENCOUNTER — Other Ambulatory Visit: Payer: Self-pay | Admitting: Family Medicine

## 2015-01-11 ENCOUNTER — Ambulatory Visit (INDEPENDENT_AMBULATORY_CARE_PROVIDER_SITE_OTHER): Payer: 59

## 2015-01-11 ENCOUNTER — Ambulatory Visit (INDEPENDENT_AMBULATORY_CARE_PROVIDER_SITE_OTHER): Payer: 59 | Admitting: Podiatry

## 2015-01-11 VITALS — BP 167/74 | HR 74 | Resp 15

## 2015-01-11 DIAGNOSIS — Z9889 Other specified postprocedural states: Secondary | ICD-10-CM

## 2015-01-11 DIAGNOSIS — M2012 Hallux valgus (acquired), left foot: Secondary | ICD-10-CM

## 2015-01-11 DIAGNOSIS — M21612 Bunion of left foot: Secondary | ICD-10-CM

## 2015-01-11 NOTE — Progress Notes (Signed)
Subjective:     Patient ID: Dorothy [name], female   DOB: 12/30/1955, 60 y.o.   MRN: [id number]  HPI patient states that she is doing reasonably well with minimal discomfort and no swelling and able to walk without pain   Review of Systems     Objective:   Physical Exam Neurovascular status intact with wound edges left that are well coapted and good alignment of the first MPJ with good range of motion with approximately 25 dorsiflexion 25 plantar flexion with no pain or crepitus noted    Assessment:     Doing well post osteotomy first metatarsal left with negative Homans sign also noted    Plan:     H&P and x-rays reviewed. I explained that there been a small crack in the distal osteotomy site but I do believe it'll heal fine at this position but I've advised on continued reduced activity and weightbearing and elevation and compression. Reappoint to recheck again in 3 weeks or earlier if any issues should occur

## 2015-01-12 NOTE — Progress Notes (Signed)
DOS 01/05/2015 Austin bunionectomy (cutting and moving bone) with pin fixation left foot.

## 2015-01-18 ENCOUNTER — Encounter: Payer: Self-pay | Admitting: Family Medicine

## 2015-01-18 ENCOUNTER — Ambulatory Visit (INDEPENDENT_AMBULATORY_CARE_PROVIDER_SITE_OTHER): Payer: 59 | Admitting: Family Medicine

## 2015-01-18 VITALS — BP 154/90 | HR 75 | Temp 98.4°F | Resp 16 | Ht 65.0 in | Wt 170.6 lb

## 2015-01-18 DIAGNOSIS — F411 Generalized anxiety disorder: Secondary | ICD-10-CM

## 2015-01-18 DIAGNOSIS — K219 Gastro-esophageal reflux disease without esophagitis: Secondary | ICD-10-CM | POA: Diagnosis not present

## 2015-01-18 DIAGNOSIS — Z0000 Encounter for general adult medical examination without abnormal findings: Secondary | ICD-10-CM | POA: Diagnosis not present

## 2015-01-18 DIAGNOSIS — E119 Type 2 diabetes mellitus without complications: Secondary | ICD-10-CM | POA: Diagnosis not present

## 2015-01-18 DIAGNOSIS — E785 Hyperlipidemia, unspecified: Secondary | ICD-10-CM | POA: Diagnosis not present

## 2015-01-18 LAB — CBC WITH DIFFERENTIAL/PLATELET
BASOS PCT: 0 % (ref 0–1)
Basophils Absolute: 0 10*3/uL (ref 0.0–0.1)
Eosinophils Absolute: 0.1 10*3/uL (ref 0.0–0.7)
Eosinophils Relative: 1 % (ref 0–5)
HCT: 40.5 % (ref 36.0–46.0)
Hemoglobin: 14 g/dL (ref 12.0–15.0)
Lymphocytes Relative: 27 % (ref 12–46)
Lymphs Abs: 1.4 10*3/uL (ref 0.7–4.0)
MCH: 31.7 pg (ref 26.0–34.0)
MCHC: 34.6 g/dL (ref 30.0–36.0)
MCV: 91.6 fL (ref 78.0–100.0)
MPV: 10.9 fL (ref 8.6–12.4)
Monocytes Absolute: 0.3 10*3/uL (ref 0.1–1.0)
Monocytes Relative: 6 % (ref 3–12)
NEUTROS PCT: 66 % (ref 43–77)
Neutro Abs: 3.4 10*3/uL (ref 1.7–7.7)
PLATELETS: 201 10*3/uL (ref 150–400)
RBC: 4.42 MIL/uL (ref 3.87–5.11)
RDW: 13 % (ref 11.5–15.5)
WBC: 5.1 10*3/uL (ref 4.0–10.5)

## 2015-01-18 LAB — COMPREHENSIVE METABOLIC PANEL
ALBUMIN: 4.2 g/dL (ref 3.5–5.2)
ALK PHOS: 65 U/L (ref 39–117)
ALT: 25 U/L (ref 0–35)
AST: 17 U/L (ref 0–37)
BILIRUBIN TOTAL: 0.4 mg/dL (ref 0.2–1.2)
BUN: 21 mg/dL (ref 6–23)
CO2: 26 mEq/L (ref 19–32)
Calcium: 9 mg/dL (ref 8.4–10.5)
Chloride: 103 mEq/L (ref 96–112)
Creat: 1.04 mg/dL (ref 0.50–1.10)
GLUCOSE: 144 mg/dL — AB (ref 70–99)
POTASSIUM: 4.1 meq/L (ref 3.5–5.3)
SODIUM: 139 meq/L (ref 135–145)
Total Protein: 6.6 g/dL (ref 6.0–8.3)

## 2015-01-18 LAB — LIPID PANEL
Cholesterol: 173 mg/dL (ref 0–200)
HDL: 66 mg/dL (ref >=46)
LDL CALC: 64 mg/dL (ref 0–99)
Total CHOL/HDL Ratio: 2.6 Ratio
Triglycerides: 214 mg/dL — ABNORMAL HIGH (ref <150)
VLDL: 43 mg/dL — ABNORMAL HIGH (ref 0–40)

## 2015-01-18 LAB — POCT URINALYSIS DIPSTICK
BILIRUBIN UA: NEGATIVE
Glucose, UA: NEGATIVE
Ketones, UA: NEGATIVE
NITRITE UA: NEGATIVE
PROTEIN UA: NEGATIVE
RBC UA: NEGATIVE
SPEC GRAV UA: 1.015
UROBILINOGEN UA: 1
pH, UA: 7

## 2015-01-18 LAB — VITAMIN D 25 HYDROXY (VIT D DEFICIENCY, FRACTURES): Vit D, 25-Hydroxy: 33 ng/mL (ref 30–100)

## 2015-01-18 LAB — TSH: TSH: 4.571 u[IU]/mL — ABNORMAL HIGH (ref 0.350–4.500)

## 2015-01-18 LAB — HEMOGLOBIN A1C
Hgb A1c MFr Bld: 6.5 % — ABNORMAL HIGH (ref <5.7)
MEAN PLASMA GLUCOSE: 140 mg/dL — AB (ref <117)

## 2015-01-18 LAB — MICROALBUMIN, URINE: Microalb, Ur: 2.5 mg/dL — ABNORMAL HIGH (ref <2.0)

## 2015-01-18 LAB — VITAMIN B12: VITAMIN B 12: 358 pg/mL (ref 211–911)

## 2015-01-18 NOTE — Progress Notes (Signed)
   Subjective:    Patient ID: Dorothy [name], female    DOB: [birth year], 59 y.o.   MRN: [id number]  HPI    Review of Systems  Constitutional: Positive for activity change.  HENT: Negative.   Eyes: Negative.   Respiratory: Negative.   Cardiovascular: Negative.   Gastrointestinal: Negative.   Endocrine: Negative.   Genitourinary: Positive for dyspareunia.  Musculoskeletal: Positive for back pain, arthralgias and neck pain.  Skin: Negative.   Allergic/Immunologic: Negative.   Neurological: Negative.   Hematological: Negative.   Psychiatric/Behavioral: Negative.        Objective:   Physical Exam        Assessment & Plan:

## 2015-01-18 NOTE — Progress Notes (Signed)
Subjective:    Patient ID: Dorothy [name], female    DOB: 12/10/1955, 59 y.o.   MRN: [id number]  01/18/2015  No chief complaint on file.   HPI This 59 y.o. female presents for Complete Physical Examination.  Last physical: Pap smear: Mammogram:  06-2014 [facility]; WNL Colonoscopy: 2011; repeat ten years.  [facility]. Bone density:  2013; not interested in another.  TDAP:  2010 Pneumovax:  2009 Zostavax:  never Influenza:  06/2014 Eye exam:  2 months ago; [facility]; no diabetic retinopathy; no cataracts or glaucoma Dental exam:  Every three months.    Review of Systems  Constitutional: Negative for fever, chills, diaphoresis, activity change, appetite change, fatigue and unexpected weight change.  HENT: Negative for congestion, dental problem, drooling, ear discharge, ear pain, facial swelling, hearing loss, mouth sores, nosebleeds, postnasal drip, rhinorrhea, sinus pressure, sneezing, sore throat, tinnitus, trouble swallowing and voice change.   Eyes: Negative for photophobia, pain, discharge, redness, itching and visual disturbance.  Respiratory: Negative for apnea, cough, choking, chest tightness, shortness of breath, wheezing and stridor.   Cardiovascular: Negative for chest pain, palpitations and leg swelling.  Gastrointestinal: Negative for nausea, vomiting, abdominal pain, diarrhea, constipation, blood in stool, abdominal distention, anal bleeding and rectal pain.  Endocrine: Negative for cold intolerance, heat intolerance, polydipsia, polyphagia and polyuria.  Genitourinary: Negative for dysuria, urgency, frequency, hematuria, flank pain, decreased urine volume, vaginal bleeding, vaginal discharge, enuresis, difficulty urinating, genital sores, vaginal pain, menstrual problem, pelvic pain and dyspareunia.  Musculoskeletal: Negative for myalgias, back pain, joint swelling, arthralgias, gait problem, neck pain and neck stiffness.  Skin: Negative for color change, pallor,  rash and wound.  Allergic/Immunologic: Negative for environmental allergies, food allergies and immunocompromised state.  Neurological: Negative for dizziness, tremors, seizures, syncope, facial asymmetry, speech difficulty, weakness, light-headedness, numbness and headaches.  Hematological: Negative for adenopathy. Does not bruise/bleed easily.  Psychiatric/Behavioral: Negative for suicidal ideas, hallucinations, behavioral problems, confusion, sleep disturbance, self-injury, dysphoric mood, decreased concentration and agitation. The patient is not nervous/anxious and is not hyperactive.     Past Medical History  Diagnosis Date  . Fatty liver   . GERD (gastroesophageal reflux disease)   . Gastritis   . Hyperlipidemia   . Depression   . Anxiety   . Choledocholithiasis   . Hx of adenomatous colonic polyps   . Diabetes mellitus   . Migraine   . Osteoarthritis   . Peptic ulcer, unspecified site, unspecified as acute or chronic, without mention of hemorrhage, perforation, or obstruction   . Otosclerosis, unspecified   . B12 deficiency   . Hematuria, unspecified   . Unspecified disorder of kidney and ureter   . Osteoporosis, unspecified   . Meniere's disease, unspecified   . Benign paroxysmal positional vertigo   . Symptomatic menopausal or female climacteric states   . Allergic rhinitis, cause unspecified   . Fatty liver 09/04/12  . Hiatal hernia   . Fibromyalgia   . Mononeuritis of unspecified site   . Hx of echocardiogram     Echo (12/15):  EF 60-65%, no RWMA, normal diast function, mild RVE  . Hx of cardiovascular stress test     ETT-Myoview (12/15):  No ischemia.  No ECG changes.  EF 73%.  Low Risk.   . Congestive heart failure    Past Surgical History  Procedure Laterality Date  . Cholecystectomy    . Tubal ligation    . Shoulder surgery      left  . Breast enhancement  surgery Bilateral   . Foot surgery      right  . Mouth surgery    . Bile duct exploration       gallstone removed  . Total abdominal hysterectomy      ovaries intact  . 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 Adhesive [Tape] Other (See Comments)    Tears skins  . Bactrim [Sulfamethoxazole-Trimethoprim] Other (See Comments)    blisters  . Codeine Itching  . Keflex [Cephalexin]   . Macrobid [Nitrofurantoin Macrocrystal] Other (See Comments)    blisters  . Nitrofurantoin Nausea And Vomiting and Other (See Comments)    Blisters   . Sulfa Drugs Cross Reactors Itching  . Talwin [Pentazocine] Nausea And Vomiting and Rash    Muscle cramps and vision changes   Current Outpatient Prescriptions  Medication Sig Dispense Refill  . aspirin 81 MG tablet Take 81 mg by mouth every evening.    . Calcium Carb-Cholecalciferol (CALCIUM 500 +D) 500-400 MG-UNIT TABS Take 1 tablet by mouth daily.     . Cholecalciferol (VITAMIN D3) 2000 UNITS TABS Take 2,000 Units by mouth daily.     . Cyanocobalamin (VITAMIN B 12 PO) Take 1,500 mg by mouth daily.    [name] DEXILANT 60 MG capsule TAKE ONE CAPSULE BY MOUTH DAILY 30 capsule 0  . estrogens, conjugated, (PREMARIN) 0.45 MG tablet Take 1 tablet (0.45 mg total) by mouth daily. 30 tablet 11  . fluticasone (FLONASE) 50 MCG/ACT nasal spray Place 2 sprays into both nostrils daily. (Patient taking differently: Place 2 sprays into both nostrils daily as needed for allergies. ) 16 g 11  . furosemide (LASIX) 20 MG tablet Take 1 tablet (20 mg total) by mouth daily. 30 tablet 6  . loperamide (IMODIUM) 2 MG capsule Take 1 capsule (2 mg total) by mouth 2 (two) times daily as needed for diarrhea or loose stools. 90 capsule 3  . meperidine (DEMEROL) 50 MG tablet Take 50 mg by mouth every 4 (four) hours as needed for severe pain.    . metFORMIN (GLUCOPHAGE) 1000 MG  tablet Take 1 tablet (1,000 mg total) by mouth 2 (two) times daily with a meal. 60 tablet 8  . omega-3 acid ethyl esters (LOVAZA) 1 G capsule Take 1 g by mouth 2 (two) times daily.    . Potassium Chloride ER 20 MEQ TBCR Take 40 mEq by mouth daily. 60 tablet 11  . pramipexole (MIRAPEX) 0.125 MG tablet 1 pill each night x 1 week, then 2 pills each night x 1 week, then 3 pills each night thereafter. Take 90-120 minutes before bedtime. 90 tablet 5  . pregabalin (LYRICA) 50 MG capsule Take 50 mg by mouth 2 (two) times daily.    . promethazine (PHENERGAN) 25 MG tablet Take 25 mg by mouth every 4 (four) hours as needed for nausea or vomiting.    . rotigotine (NEUPRO) 2 MG/24HR Place 1 patch onto the skin daily. 30 patch 5  . simvastatin (ZOCOR) 40 MG tablet Take 1 tablet (40 mg total) by mouth at bedtime. 30 tablet 11  . sitaGLIPtin (JANUVIA) 100 MG tablet Take 1 tablet (100 mg total) by mouth daily. 90 tablet 3  . venlafaxine XR (EFFEXOR-XR) 75 MG 24 hr capsule Take 1 capsule (75 mg total) by mouth  every evening. 30 capsule 11  . VOLTAREN 1 % GEL Apply 2 g topically 2 (two) times daily as needed (pain).      No current facility-administered medications for this visit.       Objective:    There were no vitals taken for this visit. Physical Exam  Constitutional: She is oriented to person, place, and time. She appears well-developed and well-nourished. No distress.  HENT:  Head: Normocephalic and atraumatic.  Right Ear: External ear normal.  Left Ear: External ear normal.  Nose: Nose normal.  Mouth/Throat: Oropharynx is clear and moist.  Eyes: Conjunctivae and EOM are normal. Pupils are equal, round, and reactive to light.  Neck: Normal range of motion and full passive range of motion without pain. Neck supple. No JVD present. Carotid bruit is not present. No thyromegaly present.  Cardiovascular: Normal rate, regular rhythm and normal heart sounds.  Exam reveals no gallop and no friction rub.     No murmur heard. Pulmonary/Chest: Effort normal and breath sounds normal. She has no wheezes. She has no rales.  Abdominal: Soft. Bowel sounds are normal. She exhibits no distension and no mass. There is no tenderness. There is no rebound and no guarding.  Musculoskeletal:       Right shoulder: Normal.       Left shoulder: Normal.       Cervical back: Normal.  Lymphadenopathy:    She has no cervical adenopathy.  Neurological: She is alert and oriented to person, place, and time. She has normal reflexes. No cranial nerve deficit. She exhibits normal muscle tone. Coordination normal.  Skin: Skin is warm and dry. No rash noted. She is not diaphoretic. No erythema. No pallor.  Psychiatric: She has a normal mood and affect. Her behavior is normal. Judgment and thought content normal.  Nursing note and vitals reviewed.  Results for orders placed or performed in visit on 11/11/14  Celiac panel  Result Value Ref Range   Tissue Transglutaminase Ab, IgA 1 <4 U/mL   Gliadin IgG 2 <20 Units   Gliadin IgA 3 <20 Units       Assessment & Plan:   1. Routine physical examination   2. HLD (hyperlipidemia)   3. Gastroesophageal reflux disease without esophagitis   4. Type 2 diabetes mellitus without complication   5. Anxiety state     No orders of the defined types were placed in this encounter.    No Follow-up on file.     Kristi Elayne Guerin, M.D. [facility] [address] [place], [place]  [address] [phone] phone [phone] fax

## 2015-01-18 NOTE — Patient Instructions (Signed)

## 2015-01-28 MED ORDER — LOSARTAN POTASSIUM 50 MG PO TABS: 50.0000 mg | ORAL_TABLET | Freq: Every day | ORAL | Status: DC

## 2015-01-28 NOTE — Addendum Note (Signed)
Addended by: [name] on: 01/28/2015 08:08 AM   Modules accepted: Orders

## 2015-02-01 ENCOUNTER — Ambulatory Visit (INDEPENDENT_AMBULATORY_CARE_PROVIDER_SITE_OTHER): Payer: 59 | Admitting: Podiatry

## 2015-02-01 ENCOUNTER — Ambulatory Visit (INDEPENDENT_AMBULATORY_CARE_PROVIDER_SITE_OTHER): Payer: 59

## 2015-02-01 VITALS — BP 141/94 | HR 67 | Resp 15

## 2015-02-01 DIAGNOSIS — M2012 Hallux valgus (acquired), left foot: Secondary | ICD-10-CM

## 2015-02-01 DIAGNOSIS — Z9889 Other specified postprocedural states: Secondary | ICD-10-CM

## 2015-02-01 DIAGNOSIS — M21612 Bunion of left foot: Secondary | ICD-10-CM

## 2015-02-01 NOTE — Progress Notes (Signed)
Subjective:     Patient ID: Dorothy [name], female   DOB: [birth year], 59 y.o.   MRN: [id number]  HPI patient states my left foot has been feeling pretty good with minimal discomfort and no acute swelling or other issues noted   Review of Systems     Objective:   Physical Exam  neurovascular status intact with patient's left foot that's doing well with wound edges well coapted good alignment noted good range of motion with 30 dorsiflexion 20    Assessment:      doing well post Austin osteotomy left    Plan:      reviewed increase in activity levels and utilization of anti-inflammatory's and reviewed x-rays and will be seen back to recheck

## 2015-02-05 ENCOUNTER — Other Ambulatory Visit: Payer: Self-pay | Admitting: Family Medicine

## 2015-02-08 ENCOUNTER — Other Ambulatory Visit: Payer: Self-pay | Admitting: Family Medicine

## 2015-02-22 ENCOUNTER — Ambulatory Visit (INDEPENDENT_AMBULATORY_CARE_PROVIDER_SITE_OTHER): Payer: 59 | Admitting: Cardiovascular Disease

## 2015-02-22 ENCOUNTER — Encounter: Payer: Self-pay | Admitting: Cardiovascular Disease

## 2015-02-22 VITALS — BP 115/82 | HR 70 | Ht 65.0 in | Wt 170.8 lb

## 2015-02-22 DIAGNOSIS — E785 Hyperlipidemia, unspecified: Secondary | ICD-10-CM

## 2015-02-22 DIAGNOSIS — I493 Ventricular premature depolarization: Secondary | ICD-10-CM | POA: Diagnosis not present

## 2015-02-22 NOTE — Progress Notes (Signed)
Cardiology Office Note Date:  02/22/2015   ID:  EMILIE CARP, DOB [birth year], MRN [id number]  PCP:  Reginia Forts, MD  Cardiologist:  Sherren Mocha, MD    Chief Complaint  Patient presents with  . Dizziness    History of Present Illness: Dorothy [name] is a 59 y.o. female who presents for follow-up evaluation. The patient has type 2 diabetes, hyperlipidemia, and has been seen for diastolic heart failure. She had an episode of shortness of breath and was evaluated in the emergency room in November 2015, at which time her BNP was elevated and she was started on Lasix. She had clinical improvement with a diarrhetic and at the time of follow-up her BNP had normalized. An echocardiogram showed normal left ventricular function. A nuclear scan showed no ischemia. She was seen back in follow-up by Richardson Dopp in December 2015 and returns today for further cardiac evaluation.  She notes that when she was first diagnosed with CHF/volume overload, she had been on HCTZ for some time for tx of Meniere's Disease. She ran out for a few weeks and then this occurred. She has now been treated with lasix and has done well since that time.  She denies shortness of breath, edema, orthopnea, or PND. She does not have chest pain. She had foot surgery several weeks ago and hasn't been physically active since that time. She does complain of 'vertigo' today and relates this to her Meniere's Disease.   Past Medical History  Diagnosis Date  . Fatty liver   . GERD (gastroesophageal reflux disease)   . Gastritis   . Hyperlipidemia   . Depression   . Anxiety   . Choledocholithiasis   . Hx of adenomatous colonic polyps   . Diabetes mellitus   . Migraine   . Osteoarthritis   . Peptic ulcer, unspecified site, unspecified as acute or chronic, without mention of hemorrhage, perforation, or obstruction   . Otosclerosis, unspecified   . B12 deficiency   . Hematuria, unspecified   . Unspecified disorder of kidney  and ureter   . Osteoporosis, unspecified   . Meniere's disease, unspecified   . Benign paroxysmal positional vertigo   . Symptomatic menopausal or female climacteric states   . Allergic rhinitis, cause unspecified   . Fatty liver 09/04/12  . Hiatal hernia   . Fibromyalgia   . Mononeuritis of unspecified site   . Hx of echocardiogram     Echo (12/15):  EF 60-65%, no RWMA, normal diast function, mild RVE  . Hx of cardiovascular stress test     ETT-Myoview (12/15):  No ischemia.  No ECG changes.  EF 73%.  Low Risk.   . Congestive heart failure     Past Surgical History  Procedure Laterality Date  . Cholecystectomy    . Tubal ligation    . Shoulder surgery      left  . Breast enhancement surgery Bilateral   . Foot surgery      right  . Mouth surgery    . Bile duct exploration      gallstone removed  . Total abdominal hysterectomy      ovaries intact  . 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 aspirin 81 MG tablet Take 81 mg by  mouth every evening.    . Calcium Carb-Cholecalciferol (CALCIUM 500 +D) 500-400 MG-UNIT TABS Take 1 tablet by mouth daily.     . Cholecalciferol (VITAMIN D3) 2000 UNITS TABS Take 2,000 Units by mouth daily.     . Cyanocobalamin (VITAMIN B 12 PO) Take 1,500 mg by mouth daily.    [name] DEXILANT 60 MG capsule TAKE ONE CAPSULE BY MOUTH DAILY 30 capsule 4  . estrogens, conjugated, (PREMARIN) 0.45 MG tablet TAKE 1 TABLET BY MOUTH DAILY. 30 tablet 1  . fluticasone (FLONASE) 50 MCG/ACT nasal spray Place 2 sprays into both nostrils daily as needed for allergies or rhinitis (Prn as neeeded for seasonal allergies).    . furosemide (LASIX) 20 MG tablet Take 1 tablet (20 mg total) by mouth daily. 30 tablet 6  . loperamide  (IMODIUM) 2 MG capsule Take 1 capsule (2 mg total) by mouth 2 (two) times daily as needed for diarrhea or loose stools. 90 capsule 3  . losartan (COZAAR) 50 MG tablet Take 1 tablet (50 mg total) by mouth daily. 90 tablet 1  . metFORMIN (GLUCOPHAGE) 1000 MG tablet TAKE 1 TABLET (1,000 MG TOTAL) BY MOUTH 2 (TWO) TIMES DAILY WITH A MEAL 60 tablet 1  . omega-3 acid ethyl esters (LOVAZA) 1 G capsule Take 1 g by mouth 2 (two) times daily.    . Potassium Chloride ER 20 MEQ TBCR Take 40 mEq by mouth daily. 60 tablet 11  . pramipexole (MIRAPEX) 0.125 MG tablet 1 pill each night x 1 week, then 2 pills each night x 1 week, then 3 pills each night thereafter. Take 90-120 minutes before bedtime. 90 tablet 5  . pregabalin (LYRICA) 50 MG capsule Take 50 mg by mouth 2 (two) times daily.    . simvastatin (ZOCOR) 40 MG tablet TAKE 1 TABLET BY MOUTH AT BEDTIME. 30 tablet 1  . sitaGLIPtin (JANUVIA) 100 MG tablet Take 1 tablet (100 mg total) by mouth daily. 90 tablet 3  . venlafaxine XR (EFFEXOR-XR) 75 MG 24 hr capsule Take 1 capsule (75 mg total) by mouth every evening. 30 capsule 11  . VOLTAREN 1 % GEL Apply 2 g topically 2 (two) times daily as needed (pain).      No current facility-administered medications for this visit.    Allergies:   Bactrim; Keflex; Macrobid; Nitrofurantoin; Sulfa drugs cross reactors; Adhesive; Codeine; and Talwin   Social History:  The patient  reports that she has never smoked. She has never used smokeless tobacco. She reports that she drinks alcohol. She reports that she does not use illicit drugs.   Family History:  The patient's family history includes Breast cancer in her maternal aunt; COPD in her father, mother, paternal grandfather, and paternal grandmother; Cancer in her father and mother; Colon cancer in her cousin; Diabetes in her brother, father, mother, and paternal grandmother; Heart attack in her father and paternal grandfather; Heart disease in her maternal grandfather;  Heart disease (age of onset: 87) in her father; Hyperlipidemia in her brother and mother; Hypertension in her brother and mother; Lung disease in her father; Obesity in her brother; Prostate cancer in her father; Stroke in her maternal grandmother.    ROS:  Please see the history of present illness.  Otherwise, review of systems is positive for dizziness.  All other systems are reviewed and negative.    PHYSICAL EXAM: VS:  BP 115/82 mmHg  Pulse 70  Ht 5\' 5"  (1.651 m)  Wt 170 lb 12.8 oz (77.474 kg)  BMI 28.42 kg/m2 , BMI Body mass index is 28.42 kg/(m^2). GEN: Well nourished, well developed, in no acute distress HEENT: normal Neck: no JVD, no masses. No carotid bruits Cardiac: RRR without murmur or gallop                Respiratory:  clear to auscultation bilaterally, normal work of breathing GI: soft, nontender, nondistended, + BS MS: no deformity or atrophy Ext: no pretibial edema, pedal pulses 2+= bilaterally Skin: warm and dry, no rash Neuro:  Strength and sensation are intact Psych: euthymic mood, full affect  EKG:  EKG is ordered today. The ekg ordered today shows NSR 70 bpm, nonspecific ST abnormality  Recent Labs: 07/30/2014: Pro B Natriuretic peptide (BNP) 31.0 01/18/2015: ALT 25; BUN 21; Creat 1.04; Hemoglobin 14.0; Platelets 201; Potassium 4.1; Sodium 139; TSH 4.571*   Lipid Panel     Component Value Date/Time   CHOL 173 01/18/2015 0832   TRIG 214* 01/18/2015 0832   HDL 66 01/18/2015 0832   CHOLHDL 2.6 01/18/2015 0832   VLDL 43* 01/18/2015 0832   LDLCALC 64 01/18/2015 0832      Wt Readings from Last 3 Encounters:  02/22/15 170 lb 12.8 oz (77.474 kg)  01/18/15 170 lb 9.6 oz (77.384 kg)  12/21/14 165 lb (74.844 kg)     Cardiac Studies Reviewed: ETT-Myoview (12/15): Overall Impression: Low risk stress nuclear study. Poor exercise tolerance. Patient reached her target heart rate quickly. No ischemia by perfusion imaging. Normal LV systolic function with no wall  motion abnormalities. LV Ejection Fraction: 73%.  Echocardiogram (12/15):  Study Conclusions - Left ventricle: The cavity size was normal. Wall thickness was normal. Systolic function was normal. The estimated ejection fraction was in the range of 60% to 65%. Wall motion was normal; there were no regional wall motion abnormalities. Left ventricular diastolic function parameters were normal. - Aortic valve: Structurally normal valve. Trileaflet; normal thickness leaflets. Transvalvular velocity was within the normal range. There was no stenosis. There was no regurgitation. - Aortic root: The aortic root was normal in size. - Ascending aorta: The ascending aorta was normal in size. - Mitral valve: Structurally normal valve. There was trivial regurgitation. - Right ventricle: The cavity size was mildly dilated. Wall thickness was normal. - Right atrium: The atrium was normal in size. - Tricuspid valve: There was no regurgitation. - Pulmonary arteries: Systolic pressure was within the normal range. - Inferior vena cava: The vessel was normal in size. The respirophasic diameter changes were in the normal range (>= 50%), consistent with normal central venous pressure. - Pericardium, extracardiac: There was no pericardial effusion.  Impressions: - Normal study.   ASSESSMENT AND PLAN: 1.  Chronic diastolic heart failure: New York Heart Association functional class I symptoms. The patient is on appropriate medical therapy. I reviewed her echo study which shows normal LV systolic function. I recommend that she continues on low-dose daily Lasix. I will see her back in one year for follow-up. We discussed issues such as a low-sodium diet, regular exercise, and the importance of weight loss.  2. Hyperlipidemia: The patient has hypertriglyceridemia, but her total cholesterol, HDL, and LDL are all in good range. She will continue current therapy and work on lifestyle modification. She is  treated by Dr. Tamala Julian.  3. Type 2 DM: Losartan added for microalbuminuria. Hemoglobin A1c in good range. Managed by Dr. Tamala Julian.  Current medicines are reviewed with the patient today.  The patient does not have concerns regarding medicines.  Labs/  tests ordered today include:   Orders Placed This Encounter  Procedures  . EKG 12-Lead   Disposition:   FU one year  Signed, Sherren Mocha, MD  02/22/2015 5:46 PM    [facility] [address], [place], [place]  [address] Phone: [phone]; Fax: [phone]

## 2015-02-22 NOTE — Patient Instructions (Signed)

## 2015-03-08 ENCOUNTER — Other Ambulatory Visit: Payer: 59

## 2015-03-16 ENCOUNTER — Other Ambulatory Visit: Payer: Self-pay | Admitting: Physician Assistant

## 2015-03-18 ENCOUNTER — Other Ambulatory Visit: Payer: Self-pay | Admitting: Physician Assistant

## 2015-03-20 ENCOUNTER — Telehealth: Payer: Self-pay | Admitting: Family Medicine

## 2015-03-20 ENCOUNTER — Other Ambulatory Visit: Payer: Self-pay | Admitting: Physician Assistant

## 2015-03-20 NOTE — Telephone Encounter (Signed)
lmom to call and reschedule her appt that she had with Tamala Julian on Aug 24

## 2015-03-20 NOTE — Telephone Encounter (Signed)
Patient need refill on her Metformin

## 2015-03-21 MED ORDER — METFORMIN HCL 1000 MG PO TABS: ORAL_TABLET | ORAL | Status: DC

## 2015-03-21 NOTE — Telephone Encounter (Signed)
Refill escribed

## 2015-03-22 ENCOUNTER — Ambulatory Visit (INDEPENDENT_AMBULATORY_CARE_PROVIDER_SITE_OTHER): Payer: 59 | Admitting: Podiatry

## 2015-03-22 ENCOUNTER — Ambulatory Visit (INDEPENDENT_AMBULATORY_CARE_PROVIDER_SITE_OTHER): Payer: 59

## 2015-03-22 DIAGNOSIS — M21612 Bunion of left foot: Secondary | ICD-10-CM

## 2015-03-22 DIAGNOSIS — Z9889 Other specified postprocedural states: Secondary | ICD-10-CM

## 2015-03-22 DIAGNOSIS — M2012 Hallux valgus (acquired), left foot: Secondary | ICD-10-CM | POA: Diagnosis not present

## 2015-03-22 DIAGNOSIS — M8430XA Stress fracture, unspecified site, initial encounter for fracture: Secondary | ICD-10-CM

## 2015-03-22 NOTE — Progress Notes (Signed)
Patient ID: Dorothy [name], female   DOB: 1956/03/05, 59 y.o.   MRN: [id number]  Subjective: 59 year old female presents the office today follow-up evaluation status post left foot Austin bunionectomy performed on 01/05/2015 with Dr. Paulla Dolly.  She states that she has tried to return to the gym and increase her activities slowly although she has had increased swelling as well as some pain to the left foot. She states that during the pain is not overlying the surgical site is to the side of it. She states that she has some swelling to the top of her foot and the area sore to touch. Denies any history of injury or trauma recently. No other complaints at this time in no acute changes since last appointment.  Objective: AAO 3, NAD DP/PT pulses palpable, CRT less than 3 seconds Protective sensation appears intact with Simms Weinstein monofilament Incision on the dorsal medial aspect of the left foot as well coapted with a scar formed. There is no tenderness to palpation overlying the first metatarsal of the first MTPJ. There is no pain with first MTPJ range of motion. Slight decrease in first MTPJ range of motion left side. There is tenderness palpation overlying the second metatarsal there is pain with vibratory sensation overlying the diaphysis of the second metatarsal. There is mild edema along the dorsal aspect of the foot without any associated erythema or increase in warmth. There is no other areas of tenderness to bilateral lower extremities. No open lesions or pre-ulcerative lesions identified bilaterally. There is no pain with calf compression, swelling, warmth, erythema.  Assessment: 59 year old female  Status post left foot Austin bunionectomy with possible stress fracture second metatarsal  Plan: -X-rays were obtained and reviewed with the patient.   There are questionablechanges of the diaphysis of the second metatarsal. Given that she has pinpoint pinpoint tenderness overlying this area as  well as pain with vibratory sensation swelling Will treat is a stress fracture for now. She has a history of multiple stress fractures as well. Recommended return to the surgical shoe. She'll follow-up in 2 weeks for repeat x-rays. Ice and elevation for now. Encourage range of motion exercises for the first MTPJ. Follow-up in 2 weeks for repeat x-rays or sooner if any palms are to arise. In the meantime I encouraged her to call the office with any questions, concerns, change in symptoms.  Celesta Gentile, DPM

## 2015-03-30 LAB — HM DEXA SCAN

## 2015-04-07 ENCOUNTER — Other Ambulatory Visit: Payer: Self-pay

## 2015-04-07 MED ORDER — VENLAFAXINE HCL ER 75 MG PO CP24: 75.0000 mg | ORAL_CAPSULE | Freq: Every evening | ORAL | Status: DC

## 2015-04-09 ENCOUNTER — Encounter: Payer: 59 | Admitting: Podiatry

## 2015-04-12 ENCOUNTER — Encounter: Payer: Self-pay | Admitting: Podiatry

## 2015-04-12 ENCOUNTER — Ambulatory Visit (INDEPENDENT_AMBULATORY_CARE_PROVIDER_SITE_OTHER): Payer: 59 | Admitting: Podiatry

## 2015-04-12 ENCOUNTER — Ambulatory Visit (INDEPENDENT_AMBULATORY_CARE_PROVIDER_SITE_OTHER): Payer: 59

## 2015-04-12 VITALS — BP 107/69 | HR 67 | Resp 16

## 2015-04-12 DIAGNOSIS — Z9889 Other specified postprocedural states: Secondary | ICD-10-CM

## 2015-04-12 DIAGNOSIS — M8430XA Stress fracture, unspecified site, initial encounter for fracture: Secondary | ICD-10-CM | POA: Diagnosis not present

## 2015-04-14 NOTE — Progress Notes (Signed)
Subjective:     Patient ID: Dorothy [name], female   DOB: 1956-03-13, 59 y.o.   MRN: [id number]  HPI patient states my left foot is improved but still tender at times   Review of Systems     Objective:   Physical Exam  neurovascular status intact no other change in health history with discomfort in the left forefoot secondary to probable stress fracture of the forefoot with edema still present but improved    Assessment:      improving stress fracture left    Plan:      reviewed condition and recommended continued ice and reviewed x-rays and gradual reduction of immobilization over the next several weeks. Reappoint to recheck

## 2015-04-15 ENCOUNTER — Other Ambulatory Visit: Payer: Self-pay | Admitting: Physician Assistant

## 2015-04-15 NOTE — Telephone Encounter (Signed)
Advise on refill for premarin.

## 2015-04-21 ENCOUNTER — Ambulatory Visit: Payer: 59 | Admitting: Family Medicine

## 2015-04-23 ENCOUNTER — Encounter: Payer: Self-pay | Admitting: Family Medicine

## 2015-05-15 ENCOUNTER — Other Ambulatory Visit: Payer: Self-pay | Admitting: Physician Assistant

## 2015-05-21 ENCOUNTER — Encounter: Payer: Self-pay | Admitting: Family Medicine

## 2015-06-10 ENCOUNTER — Ambulatory Visit: Payer: Self-pay | Admitting: Neurology

## 2015-06-12 ENCOUNTER — Other Ambulatory Visit: Payer: Self-pay | Admitting: Family Medicine

## 2015-06-14 ENCOUNTER — Ambulatory Visit (INDEPENDENT_AMBULATORY_CARE_PROVIDER_SITE_OTHER): Payer: 59 | Admitting: Family Medicine

## 2015-06-14 ENCOUNTER — Encounter: Payer: Self-pay | Admitting: Family Medicine

## 2015-06-14 VITALS — BP 119/79 | HR 62 | Temp 97.9°F | Resp 16 | Ht 65.0 in | Wt 169.0 lb

## 2015-06-14 DIAGNOSIS — Z114 Encounter for screening for human immunodeficiency virus [HIV]: Secondary | ICD-10-CM | POA: Diagnosis not present

## 2015-06-14 DIAGNOSIS — I5032 Chronic diastolic (congestive) heart failure: Secondary | ICD-10-CM | POA: Diagnosis not present

## 2015-06-14 DIAGNOSIS — Z23 Encounter for immunization: Secondary | ICD-10-CM | POA: Diagnosis not present

## 2015-06-14 DIAGNOSIS — E785 Hyperlipidemia, unspecified: Secondary | ICD-10-CM | POA: Diagnosis not present

## 2015-06-14 DIAGNOSIS — M797 Fibromyalgia: Secondary | ICD-10-CM | POA: Diagnosis not present

## 2015-06-14 DIAGNOSIS — Z1159 Encounter for screening for other viral diseases: Secondary | ICD-10-CM | POA: Diagnosis not present

## 2015-06-14 DIAGNOSIS — R7989 Other specified abnormal findings of blood chemistry: Secondary | ICD-10-CM | POA: Diagnosis not present

## 2015-06-14 DIAGNOSIS — F411 Generalized anxiety disorder: Secondary | ICD-10-CM | POA: Diagnosis not present

## 2015-06-14 DIAGNOSIS — E1121 Type 2 diabetes mellitus with diabetic nephropathy: Secondary | ICD-10-CM | POA: Diagnosis not present

## 2015-06-14 LAB — COMPREHENSIVE METABOLIC PANEL
ALBUMIN: 4.5 g/dL (ref 3.6–5.1)
ALK PHOS: 63 U/L (ref 33–130)
ALT: 34 U/L — AB (ref 6–29)
AST: 22 U/L (ref 10–35)
BILIRUBIN TOTAL: 0.7 mg/dL (ref 0.2–1.2)
BUN: 19 mg/dL (ref 7–25)
CHLORIDE: 102 mmol/L (ref 98–110)
CO2: 27 mmol/L (ref 20–31)
CREATININE: 1.03 mg/dL (ref 0.50–1.05)
Calcium: 9.7 mg/dL (ref 8.6–10.4)
Glucose, Bld: 135 mg/dL — ABNORMAL HIGH (ref 65–99)
Potassium: 4.7 mmol/L (ref 3.5–5.3)
SODIUM: 138 mmol/L (ref 135–146)
TOTAL PROTEIN: 6.6 g/dL (ref 6.1–8.1)

## 2015-06-14 LAB — LIPID PANEL
Cholesterol: 175 mg/dL (ref 125–200)
HDL: 70 mg/dL (ref >=46)
LDL CALC: 73 mg/dL (ref <130)
Total CHOL/HDL Ratio: 2.5 Ratio (ref <=5.0)
Triglycerides: 162 mg/dL — ABNORMAL HIGH (ref <150)
VLDL: 32 mg/dL — ABNORMAL HIGH (ref <30)

## 2015-06-14 LAB — HEPATITIS C ANTIBODY: HCV AB: NEGATIVE

## 2015-06-14 LAB — HEMOGLOBIN A1C
HEMOGLOBIN A1C: 6.3 % — AB (ref <5.7)
MEAN PLASMA GLUCOSE: 134 mg/dL — AB (ref <117)

## 2015-06-14 LAB — HIV ANTIBODY (ROUTINE TESTING W REFLEX): HIV: NONREACTIVE

## 2015-06-14 LAB — TSH: TSH: 5.386 u[IU]/mL — AB (ref 0.350–4.500)

## 2015-06-14 MED ORDER — FLUTICASONE PROPIONATE 50 MCG/ACT NA SUSP: 2.0000 | Freq: Every day | NASAL | Status: DC

## 2015-06-14 NOTE — Addendum Note (Signed)
Addended by: [name] on: 06/14/2015 10:34 AM   Modules accepted: Orders, Medications

## 2015-06-14 NOTE — Progress Notes (Signed)
Subjective:    Patient ID: Dorothy [name], female    DOB: May 07, 1956, 59 y.o.   MRN: [id number]  06/14/2015  Follow-up and Medication Refill   HPI This 59 y.o. female presents for five month follow-up:   1. DMII: Patient reports good compliance with medication, good tolerance to medication, and good symptom control.  Not checking sugars at home.  Losartan started five months ago for +urine microalbumin. HgbA1c 6.5.    2.  CHF diastolic:  Patient reports good compliance with medication, good tolerance to medication, and good symptom control.  Follow-up with cardiology in 01/2015.  Yearly follow-up.  Takes Lasix 59m daily.  3.  Hyperlipidemia:  Patient reports good compliance with medication, good tolerance to medication, and good symptom control.    4.  Anxiety and depression:  Patient reports good compliance with medication, good tolerance to medication, and good symptom control.   Terminated since last visit.  Understaffed; made a mistake about writing up chart.  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 Terminated two weeks ago.    Not looking for a job. No SI/HI.  Effexor XR 751mdaily; no interested in increasing dose.  5.  L foot bunionectomy: then had a stress fracture in the same foot.  Still very tender to avoid pressure to L foot.    6. Fibromyalgia: followed by rheumatology every 6 months.  7. Abnormal TSH: due for repeat labs.    Review of Systems  Constitutional: Negative for fever, chills, diaphoresis and fatigue.  Eyes: Negative for visual disturbance.  Respiratory: Negative for cough and shortness of breath.   Cardiovascular: Negative for chest pain, palpitations and leg swelling.  Gastrointestinal: Negative for nausea, vomiting, abdominal pain, diarrhea and constipation.  Endocrine: Negative for cold intolerance, heat intolerance, polydipsia, polyphagia and polyuria.  Neurological: Positive for dizziness. Negative for tremors, seizures,  syncope, facial asymmetry, speech difficulty, weakness, light-headedness, numbness and headaches.  Psychiatric/Behavioral: Positive for dysphoric mood. Negative for suicidal ideas, sleep disturbance and self-injury. The patient is nervous/anxious.     Past Medical History  Diagnosis Date  . Fatty liver   . GERD (gastroesophageal reflux disease)   . Gastritis   . Hyperlipidemia   . Depression   . Anxiety   . Choledocholithiasis   . Hx of adenomatous colonic polyps   . Diabetes mellitus   . Migraine   . Osteoarthritis   . Peptic ulcer, unspecified site, unspecified as acute or chronic, without mention of hemorrhage, perforation, or obstruction   . Otosclerosis, unspecified   . B12 deficiency   . Hematuria, unspecified   . Unspecified disorder of kidney and ureter   . Osteoporosis, unspecified   . Meniere's disease, unspecified   . Benign paroxysmal positional vertigo   . Symptomatic menopausal or female climacteric states   . Allergic rhinitis, cause unspecified   . Fatty liver 09/04/12  . Hiatal hernia   . Fibromyalgia   . Mononeuritis of unspecified site   . Hx of echocardiogram     Echo (12/15):  EF 60-65%, no RWMA, normal diast function, mild RVE  . Hx of cardiovascular stress test     ETT-Myoview (12/15):  No ischemia.  No ECG changes.  EF 73%.  Low Risk.   [facility]   Past Surgical History  Procedure Laterality Date  . Cholecystectomy    . Tubal ligation    . Shoulder surgery      left  . Breast enhancement surgery  Bilateral   . Foot surgery      right  . Mouth surgery    . Bile duct exploration      gallstone removed  . Total abdominal hysterectomy      ovaries intact  . 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 Bactrim  [Sulfamethoxazole-Trimethoprim] Other (See Comments)    blisters  . Keflex [Cephalexin] Hives  . Macrobid [Nitrofurantoin Macrocrystal] Other (See Comments)    blisters  . Nitrofurantoin Nausea And Vomiting and Other (See Comments)    Blisters   . Sulfa Drugs Cross Reactors Itching  . Adhesive [Tape] Other (See Comments)    Tears skins  . Codeine Itching  . Talwin [Pentazocine] Nausea And Vomiting and Rash    Muscle cramps and vision changes   Current Outpatient Prescriptions  Medication Sig Dispense Refill  . aspirin 81 MG tablet Take 81 mg by mouth every evening.    . Calcium Carb-Cholecalciferol (CALCIUM 500 +D) 500-400 MG-UNIT TABS Take 1 tablet by mouth daily.     . Cholecalciferol (VITAMIN D3) 2000 UNITS TABS Take 2,000 Units by mouth daily.     . Cyanocobalamin (VITAMIN B 12 PO) Take 1,500 mg by mouth daily.    [name] DEXILANT 60 MG capsule TAKE ONE CAPSULE BY MOUTH DAILY 30 capsule 4  . fluticasone (FLONASE) 50 MCG/ACT nasal spray Place 2 sprays into both nostrils daily as needed for allergies or rhinitis (Prn as neeeded for seasonal allergies).    . furosemide (LASIX) 20 MG tablet TAKE 1 TABLET (20 MG TOTAL) BY MOUTH DAILY. 30 tablet 11  . losartan (COZAAR) 50 MG tablet Take 1 tablet (50 mg total) by mouth daily. 90 tablet 1  . metFORMIN (GLUCOPHAGE) 1000 MG tablet TAKE 1 TABLET (1,000 MG TOTAL) BY MOUTH 2 (TWO) TIMES DAILY WITH A MEAL 60 tablet 11  . omega-3 acid ethyl esters (LOVAZA) 1 G capsule Take 1 g by mouth 2 (two) times daily.    . Potassium Chloride ER 20 MEQ TBCR Take 40 mEq by mouth daily. 60 tablet 11  . pramipexole (MIRAPEX) 0.125 MG tablet 1 pill each night x 1 week, then 2 pills each night x 1 week, then 3 pills each night thereafter. Take 90-120 minutes before bedtime. 90 tablet 5  . pregabalin (LYRICA) 50 MG capsule Take 50 mg by mouth 2 (two) times daily.    [name] PREMARIN 0.45 MG tablet TAKE 1 TABLET BY MOUTH DAILY. 30 tablet 1  . simvastatin (ZOCOR) 40 MG tablet  TAKE 1 TABLET BY MOUTH AT BEDTIME. 30 tablet 1  . sitaGLIPtin (JANUVIA) 100 MG tablet Take 1 tablet (100 mg total) by mouth daily. 90 tablet 3  . venlafaxine XR (EFFEXOR-XR) 75 MG 24 hr capsule Take 1 capsule (75 mg total) by mouth every evening. 90 capsule 3  . VOLTAREN 1 % GEL Apply 2 g topically 2 (two) times daily as needed (pain).     . furosemide (LASIX) 20 MG tablet TAKE 1 TABLET (20 MG TOTAL) BY MOUTH DAILY. 30 tablet 10  . loperamide (IMODIUM) 2 MG capsule Take 1 capsule (2 mg total) by mouth 2 (two) times daily as needed for diarrhea or loose stools. 90 capsule 3   No current facility-administered medications for this visit.   Social History  Social History  . Marital Status: Married    Spouse Name: Richardson Landry  . Number of Children: 3  . Years of Education: 10th grade   Occupational History  .     Social History Main Topics  . Smoking status: Never Smoker   . Smokeless tobacco: Never Used  . Alcohol Use: 0.0 oz/week    0 Standard drinks or equivalent per week     Comment: occasional 1 x month  . Drug Use: No  . Sexual Activity: Yes   Other Topics Concern  . Not on file   Social History Narrative      Marital status:  Married x 20 years; happily married,no abuse. 2nd marriage.      Children: 3 sons (79, 82, 36)---2 sons with substance abuse; 3 grandchildren; 3 step grandchildren; 2 gg.      Lives: with husband, son.      Tobacco:none       Alcohol:   none      Drugs:none      Exercise: exercising at gym.        Seatbelt: 100%      Guns: none      Always uses seat belts. Smoke alarm and carbon monoxide detector in the home.No caffeine use. No unsecured guns in the home.    Family History  Problem Relation Age of Onset  . Colon cancer Cousin     died age 47  . Prostate cancer Father   . Lung disease Father   . Diabetes Father   . Heart disease Father 73    CHF, AMI multiple/CABG  . COPD Father   . Cancer Father     prostate cancer  . Heart attack Father     . Breast cancer Maternal Aunt   . Diabetes Mother   . Hypertension Mother   . Hyperlipidemia Mother   . COPD Mother   . Cancer Mother     lung cancer  . Diabetes Brother   . Hypertension Brother   . Obesity Brother   . Hyperlipidemia Brother   . Stroke Maternal Grandmother   . Heart disease Maternal Grandfather   . COPD Paternal Grandmother   . Diabetes Paternal Grandmother   . COPD Paternal Grandfather   . Heart attack Paternal Grandfather        Objective:    BP 119/79 mmHg  Pulse 62  Temp(Src) 97.9 F (36.6 C) (Oral)  Resp 16  Ht _0  (1.651 m)  Wt 169 lb (76.658 kg)  BMI 28.12 kg/m2 Physical Exam  Constitutional: She is oriented to person, place, and time. She appears well-developed and well-nourished. No distress.  HENT:  Head: Normocephalic and atraumatic.  Right Ear: External ear normal.  Left Ear: External ear normal.  Nose: Nose normal.  Mouth/Throat: Oropharynx is clear and moist.  Eyes: Conjunctivae and EOM are normal. Pupils are equal, round, and reactive to light.  Neck: Normal range of motion. Neck supple. Carotid bruit is not present. No thyromegaly present.  Cardiovascular: Normal rate, regular rhythm, normal heart sounds and intact distal pulses.  Exam reveals no gallop and no friction rub.   No murmur heard. Pulmonary/Chest: Effort normal and breath sounds normal. She has no wheezes. She has no rales.  Abdominal: Soft. Bowel sounds are normal. She exhibits no distension and no mass. There is no tenderness. There is no rebound and no guarding.  Lymphadenopathy:    She has no cervical adenopathy.  Neurological: She is alert and oriented  to person, place, and time. No cranial nerve deficit.  Skin: Skin is warm and dry. No rash noted. She is not diaphoretic. No erythema. No pallor.  Psychiatric: She has a normal mood and affect. Her behavior is normal.   Results for orders placed or performed in visit on 01/18/15  CBC with Differential/Platelet   Result Value Ref Range   WBC 5.1 4.0 - 10.5 K/uL   RBC 4.42 3.87 - 5.11 MIL/uL   Hemoglobin 14.0 12.0 - 15.0 g/dL   HCT 40.5 36.0 - 46.0 %   MCV 91.6 78.0 - 100.0 fL   MCH 31.7 26.0 - 34.0 pg   MCHC 34.6 30.0 - 36.0 g/dL   RDW 13.0 11.5 - 15.5 %   Platelets 201 150 - 400 K/uL   MPV 10.9 8.6 - 12.4 fL   Neutrophils Relative % 66 43 - 77 %   Neutro Abs 3.4 1.7 - 7.7 K/uL   Lymphocytes Relative 27 12 - 46 %   Lymphs Abs 1.4 0.7 - 4.0 K/uL   Monocytes Relative 6 3 - 12 %   Monocytes Absolute 0.3 0.1 - 1.0 K/uL   Eosinophils Relative 1 0 - 5 %   Eosinophils Absolute 0.1 0.0 - 0.7 K/uL   Basophils Relative 0 0 - 1 %   Basophils Absolute 0.0 0.0 - 0.1 K/uL   Smear Review Criteria for review not met   Comprehensive metabolic panel  Result Value Ref Range   Sodium 139 135 - 145 mEq/L   Potassium 4.1 3.5 - 5.3 mEq/L   Chloride 103 96 - 112 mEq/L   CO2 26 19 - 32 mEq/L   Glucose, Bld 144 (H) 70 - 99 mg/dL   BUN 21 6 - 23 mg/dL   Creat 1.04 0.50 - 1.10 mg/dL   Total Bilirubin 0.4 0.2 - 1.2 mg/dL   Alkaline Phosphatase 65 39 - 117 U/L   AST 17 0 - 37 U/L   ALT 25 0 - 35 U/L   Total Protein 6.6 6.0 - 8.3 g/dL   Albumin 4.2 3.5 - 5.2 g/dL   Calcium 9.0 8.4 - 10.5 mg/dL  Hemoglobin A1c  Result Value Ref Range   Hgb A1c MFr Bld 6.5 (H) <5.7 %   Mean Plasma Glucose 140 (H) <117 mg/dL  Lipid panel  Result Value Ref Range   Cholesterol 173 0 - 200 mg/dL   Triglycerides 214 (H) <150 mg/dL   HDL 66 >=46 mg/dL   Total CHOL/HDL Ratio 2.6 Ratio   VLDL 43 (H) 0 - 40 mg/dL   LDL Cholesterol 64 0 - 99 mg/dL  Microalbumin, urine  Result Value Ref Range   Microalb, Ur 2.5 (H) <2.0 mg/dL  TSH  Result Value Ref Range   TSH 4.571 (H) 0.350 - 4.500 uIU/mL  Vit D  25 hydroxy (rtn osteoporosis monitoring)  Result Value Ref Range   Vit D, 25-Hydroxy 33 30 - 100 ng/mL  Vitamin B12  Result Value Ref Range   Vitamin B-12 358 211 - 911 pg/mL  POCT urinalysis dipstick  Result Value Ref Range    Color, UA Yellow    Clarity, UA Clear    Glucose, UA Negative    Bilirubin, UA Negative    Ketones, UA Negative    Spec Grav, UA 1.015    Blood, UA negative    pH, UA 7.0    Protein, UA negative    Urobilinogen, UA 1.0    Nitrite, UA Negative  Leukocytes, UA Trace    PNEUMOVAX AND INFLUENZA VACCINES ADMINISTERED.    Assessment & Plan:   1. Type 2 diabetes mellitus with diabetic nephropathy, without long-term current use of insulin ([place])   2. HLD (hyperlipidemia)   3. Fibromyalgia   4. Anxiety state   5. Need for prophylactic vaccination and inoculation against influenza   6. Screening for HIV (human immunodeficiency virus)   7. Need for hepatitis C screening test   8. Chronic diastolic heart failure ([place])   9. Abnormal TSH   10. Need for prophylactic vaccination against Streptococcus pneumoniae (pneumococcus)     1.DMII with nephropathy: Controlled; obtain labs; refills provided; s/p flu vaccine and Pneumovax.  Tolerating Losartan. 2.  Hyperlipidemia: controlled; obtain labs; continue medications. 3.  Fibromyalgia: stable; followed by [facility] every six months. 4.  Anxiety and depression: worsening due to recent termination; no changes to management; good church support and family support. 5.  Chronic diastolic heart failure: euvolemic today; continue Lasix 75m daily; followed by cardiology annually. 6.  Abnormal TSH: due for repeat TSH.   7.  Screening HIV; obtain HIV. 8. Screening Hepatitis C: obtain Hepatitis C. 9. S/p Pneumovax and influenza vaccines.   Orders Placed This Encounter  Procedures  . Flu Vaccine QUAD 36+ mos IM  . Pneumococcal polysaccharide vaccine 23-valent greater than or equal to 2yo subcutaneous/IM  . Comprehensive metabolic panel    Order Specific Question:  Has the patient fasted?    Answer:  Yes  . Hemoglobin A1c  . Lipid panel    Order Specific Question:  Has the patient fasted?    Answer:  Yes  . HIV antibody  . Hepatitis C  antibody  . TSH   No orders of the defined types were placed in this encounter.    Return in about 3 months (around 09/14/2015) for recheck.    Finnley Lewis MElayne Guerin M.D. [facility] MShorewood[address] DriveGDilworthtown [place]  [phone]

## 2015-07-08 ENCOUNTER — Encounter: Payer: Self-pay | Admitting: Neurology

## 2015-07-08 ENCOUNTER — Ambulatory Visit (INDEPENDENT_AMBULATORY_CARE_PROVIDER_SITE_OTHER): Payer: 59 | Admitting: Neurology

## 2015-07-08 VITALS — BP 118/78 | HR 72 | Resp 16 | Ht 65.0 in | Wt 172.0 lb

## 2015-07-08 DIAGNOSIS — G2581 Restless legs syndrome: Secondary | ICD-10-CM | POA: Diagnosis not present

## 2015-07-08 DIAGNOSIS — G4761 Periodic limb movement disorder: Secondary | ICD-10-CM | POA: Diagnosis not present

## 2015-07-08 MED ORDER — PRAMIPEXOLE DIHYDROCHLORIDE 0.125 MG PO TABS: 0.3750 mg | ORAL_TABLET | Freq: Every day | ORAL | Status: DC

## 2015-07-08 NOTE — Patient Instructions (Signed)
We will continue with your low dose Mirapex at 0.125 mg 2-3 pills each night for restless legs.  Your exam is stable.  I can see you in a year!

## 2015-07-08 NOTE — Progress Notes (Signed)
Subjective:    Patient ID: Dorothy [name] is a 59 y.o. female.  HPI     Interim history:   Dorothy [name] is a 59 year old right-handed woman with an underlying complex medical history of diabetes, hyperlipidemia, depression, fatty liver, reflux disease, anxiety, gallstones, migraine headaches, osteoarthritis, peptic ulcer disease, vitamin B12 deficiency, Mnire's disease and benign positional vertigo, allergic rhinitis, hiatal hernia and fibromyalgia, who presents for followup consultation of her RLS. She is unaccompanied today. I last saw her on 12/10/2014, at which time she reported that she was diagnosed with congestive heart failure, in November 2015. She had developed shortness of breath about 6 months prior. She had seen a cardiologist. Her symptoms gradually worsened. She had to go to the emergency room on 07/27/2014 because of sudden worsening of shortness of breath. She was diagnosed with CHF. She had a CXR 2 views on 07/27/14: 1. Cardiomegaly and pulmonary venous congestion without definite evidence of edema. 2. Slight worsening bibasilar atelectasis, left greater than right.  She had a nuclear stress test on 08/14/2014 which was negative. She had an echocardiogram on 08/04/2014: EF was 60-65%, wall motion was normal, all in all it was a normal study. She was started on Lasix and was able to lose several fluid pounds. She had developed lower extremity edema which improved. Her breathing improved. As far as her RLS, unfortunately she had started noticing itching and burning at the site of the patches and while the neupro was still helpful, the site reactions were troublesome. She started reducing the time the patch was on down from 24 hours to about 8 hours per day. She noticed that her restless leg symptoms flared up with that. She was on an iron supplement. I asked her to discontinue Neupro patch altogether. I suggested we start her on Mirapex low-dose with slow titration, 0.125 mg  strength.  Today, 07/08/2015: She reports doing well in Mirapex, current feeling well on just 0.25 mg each night. No SEs and feels, it is helpful. She had a follow-up with Dr. Burt Knack in cardiology in June of this year and I reviewed the note. She was doing well. He suggested a one-year follow-up. She has no new complaints. She did quit her job some 6 weeks ago and enjoying her new free time. All her children are in [place], which is where they are originally from, and will be visiting over the holidays. Her husband still works full-time.   Previously:   I saw her on 06/09/2014, at which time she reported doing better with Neupro. She was on the 2 mg patch. She had no side effects and felt she was sleeping much better. She reported that her little dog unfortunately found one of her old patches that fell off while she was changing and chewed on it and apparently had a overdose type reaction to it. She had to rush him to the vet. Since then she was very careful in removing and applying the patch. She noticed that one time when she forgot to change the patch she had a flareup of her RLS symptoms. She was taking iron supplements. She was trying to lose weight and joined a gym. She was on a second diabetes medication per primary care physician.   I saw her on 01/09/2014, at which time she reported significant RLS symptoms and difficulty with sleep onset as well as sleep maintenance. Sleep issues have been a long-standing problem for her. She also reported a recent cardiac workup and other than PVCs  there were no abnormalities found. I talked her about her sleep test results in detail. I suggested we check iron studies. Her ferritin level was 10 and we called her with the test results. I advised her to start an over-the-counter iron supplement. Her hemoglobin and hematocrit were slightly low which were checked by her primary care provider a few days later. I also suggested she start Neupro patch for  symptomatic treatment of RLS. I started her on 1 mg strength with increased to 2 mg. She has been diagnosed with diabetes in the interim. She follows with Dr. Estanislado Pandy for arthritis.   I first met her on 11/13/2013 at the request of her primary care physician, at which time she reported intermittent shortness of breath, palpitations, indigestion, and excessive daytime somnolence, with a need for daily nap after work between 2 and 6 PM. She also reported restless leg symptoms and jerking in her sleep. She reported having had a sleep study many years ago which she recalled showed snoring. 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 She has braces on top, which may come out soon. 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 She is a non-smoker and drinks alcohol very occasionally and does not drink caffeine. She lost about 15 lb from the jaw surgery, but has mostly gained it back. I suggested she return for sleep study.   She she had a baseline sleep study on 12/17/2013: Sleep efficiency was reduced at 62.9% latency to sleep of 73 minutes and wake after sleep onset of 77 minutes with moderate to severe sleep fragmentation noted. She had an elevated arousal index of 28.5 per hour primarily because of periodic leg movements. She had a normal percentage of stage I sleep, a markedly increased percentage of stage II sleep, and absence of slow-wave and REM sleep. She had severe periodic leg movements of sleep. Index was 76.9 per hour with an associated arousal index of 21.5 per hour. She had no significant EKG changes. Minimal intermittent snoring was noted. She slept mostly in the lateral positions. She had a total AHI of 4 per hour, rising to 25.4 per hour in the supine position. Baseline oxygen saturation was 95%, nadir was 90%. I felt that the absence of REM sleep and her estimated her potential underlying  sleep disorder breathing. I felt that her Effexor may be a contributor to her restless leg symptoms and PLMS.    Her Past Medical History Is Significant For: Past Medical History  Diagnosis Date  . Fatty liver   . GERD (gastroesophageal reflux disease)   . Gastritis   . Hyperlipidemia   . Depression   . Anxiety   . Choledocholithiasis   . Hx of adenomatous colonic polyps   . Diabetes mellitus   . Migraine   . Osteoarthritis   . Peptic ulcer, unspecified site, unspecified as acute or chronic, without mention of hemorrhage, perforation, or obstruction   . Otosclerosis, unspecified   . B12 deficiency   . Hematuria, unspecified   . Unspecified disorder of kidney and ureter   . Osteoporosis, unspecified   . Meniere's disease, unspecified   . Benign paroxysmal positional vertigo   . Symptomatic menopausal or female climacteric states   . Allergic rhinitis, cause unspecified   . Fatty liver 09/04/12  . Hiatal hernia   . Fibromyalgia   . Mononeuritis of unspecified site   . Hx  of echocardiogram     Echo (12/15):  EF 60-65%, no RWMA, normal diast function, mild RVE  . Hx of cardiovascular stress test     ETT-Myoview (12/15):  No ischemia.  No ECG changes.  EF 73%.  Low Risk.   . Congestive heart failure ([place])     Her Past Surgical History Is Significant For: Past Surgical History  Procedure Laterality Date  . Cholecystectomy    . Tubal ligation    . Shoulder surgery      left  . Breast enhancement surgery Bilateral   . Foot surgery      right  . Mouth surgery    . Bile duct exploration      gallstone removed  . Total abdominal hysterectomy      ovaries intact  . 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  Colon cancer Cousin     died age 57  . Prostate cancer Father   . Lung disease Father   . Diabetes Father   . Heart disease Father 36    CHF, AMI multiple/CABG  . COPD Father   . Cancer Father     prostate cancer  . Heart attack Father   . Breast cancer Maternal Aunt   . Diabetes Mother   . Hypertension Mother   . Hyperlipidemia Mother   . COPD Mother   . Cancer Mother     lung cancer  . Diabetes Brother   . Hypertension Brother   . Obesity Brother   . Hyperlipidemia Brother   . Stroke Maternal Grandmother   . Heart disease Maternal Grandfather   . COPD Paternal Grandmother   . Diabetes Paternal Grandmother   . COPD Paternal Grandfather   . Heart attack Paternal Grandfather     Her Social History Is Significant For: Social History   Social History  . Marital Status: Married    Spouse Name: Richardson Landry  . Number of Children: 3  . Years of Education: 10th grade   Occupational History  .     Social History Main Topics  . Smoking status: Never Smoker   . Smokeless tobacco: Never Used  . Alcohol Use: 0.0 oz/week    0 Standard drinks or equivalent per week     Comment: occasional 1 x month  . Drug Use: No  . Sexual Activity: Yes   Other Topics Concern  . None   Social History Narrative      Marital status:  Married x 20 years; happily married,no abuse. 2nd marriage.      Children: 3 sons (27, 75, 36)---2 sons with substance abuse; 3 grandchildren; 3 step grandchildren; 2 gg.      Lives: with husband, son.      Tobacco:none       Alcohol:   none      Drugs:none      Exercise: exercising at gym.        Seatbelt: 100%      Guns: none      Always uses seat belts. Smoke alarm and carbon monoxide detector in the home.No caffeine use. No unsecured guns in the home.     Her Allergies Are:  Allergies  Allergen Reactions  . Bactrim [Sulfamethoxazole-Trimethoprim]  Other (See Comments)    blisters  . Keflex [Cephalexin] Hives  . Macrobid [Nitrofurantoin Macrocrystal]  Other (See Comments)    blisters  . Nitrofurantoin Nausea And Vomiting and Other (See Comments)    Blisters   . Sulfa Drugs Cross Reactors Itching  . Adhesive [Tape] Other (See Comments)    Tears skins  . Codeine Itching  . Talwin [Pentazocine] Nausea And Vomiting and Rash    Muscle cramps and vision changes  :   Her Current Medications Are:  Outpatient Encounter Prescriptions as of 07/08/2015  Medication Sig  . aspirin 81 MG tablet Take 81 mg by mouth every evening.  . Calcium Carb-Cholecalciferol (CALCIUM 500 +D) 500-400 MG-UNIT TABS Take 1 tablet by mouth daily.   . Cholecalciferol (VITAMIN D3) 2000 UNITS TABS Take 2,000 Units by mouth daily.   . Cyanocobalamin (VITAMIN B 12 PO) Take 1,500 mg by mouth daily.  [name] DEXILANT 60 MG capsule TAKE ONE CAPSULE BY MOUTH DAILY  . fluticasone (FLONASE) 50 MCG/ACT nasal spray Place 2 sprays into both nostrils daily.  . furosemide (LASIX) 20 MG tablet TAKE 1 TABLET (20 MG TOTAL) BY MOUTH DAILY.  [name] loperamide (IMODIUM) 2 MG capsule Take 1 capsule (2 mg total) by mouth 2 (two) times daily as needed for diarrhea or loose stools.  [name] losartan (COZAAR) 50 MG tablet Take 1 tablet (50 mg total) by mouth daily.  . metFORMIN (GLUCOPHAGE) 1000 MG tablet TAKE 1 TABLET (1,000 MG TOTAL) BY MOUTH 2 (TWO) TIMES DAILY WITH A MEAL  . omega-3 acid ethyl esters (LOVAZA) 1 G capsule Take 1 g by mouth 2 (two) times daily.  . Potassium Chloride ER 20 MEQ TBCR Take 40 mEq by mouth daily.  . pramipexole (MIRAPEX) 0.125 MG tablet 1 pill each night x 1 week, then 2 pills each night x 1 week, then 3 pills each night thereafter. Take 90-120 minutes before bedtime.  . pregabalin (LYRICA) 50 MG capsule Take 50 mg by mouth 2 (two) times daily.  [name] PREMARIN 0.45 MG tablet TAKE 1 TABLET BY MOUTH DAILY.  . simvastatin (ZOCOR) 40 MG tablet TAKE 1 TABLET BY MOUTH AT BEDTIME.  . sitaGLIPtin (JANUVIA) 100 MG tablet Take 1 tablet (100 mg total) by mouth daily.  [name] venlafaxine XR  (EFFEXOR-XR) 75 MG 24 hr capsule Take 1 capsule (75 mg total) by mouth every evening.  [name] VOLTAREN 1 % GEL Apply 2 g topically 2 (two) times daily as needed (pain).   . [DISCONTINUED] ranitidine (ZANTAC) 300 MG tablet TAKE 1 TABLET (300 MG TOTAL) BY MOUTH AT BEDTIME   No facility-administered encounter medications on file as of 07/08/2015.  :  Review of Systems:  Out of a complete 14 point review of systems, all are reviewed and negative with the exception of these symptoms as listed below:   Review of Systems  Neurological:       Patient is here for f/u. Started Mirapex after last visit and reports that it is working well for her.     Objective:  Neurologic Exam  Physical Exam Physical Examination:   Filed Vitals:   07/08/15 1257  BP: 118/78  Pulse: 72  Resp: 16    General Examination: The patient is a very pleasant 59 y.o. female in no acute distress. She appears well-developed and well-nourished and well groomed. She is in good spirits today.   HEENT: Normocephalic, atraumatic, pupils are equal, round and reactive to light and accommodation. Funduscopic exam is normal with sharp  disc margins noted. Extraocular tracking is good without limitation to gaze excursion or nystagmus noted. Normal smooth pursuit is noted. Hearing is grossly intact. Face is symmetric with normal facial animation and normal facial sensation. Speech is clear with no dysarthria noted. There is no hypophonia. There is no lip, neck/head, jaw or voice tremor. Neck is supple with full range of passive and active motion. There are no carotid bruits on auscultation. Oropharynx exam reveals: moderate mouth dryness, adequate dental hygiene with upper braces in place and moderate airway crowding, due to larger and elongated uvula and narrow airway entry. Mallampati is class II. Tongue protrudes centrally and palate elevates symmetrically. Tonsils are 1+ in size.   Chest: Clear to auscultation without wheezing, rhonchi  or crackles noted.  Heart: S1+S2+0, regular and normal without murmurs, rubs or gallops noted.   Abdomen: Soft, non-tender and non-distended with normal bowel sounds appreciated on auscultation.  Extremities: There is no edema. Pedal pulses are intact.   Skin: Warm and dry without trophic changes noted. There are no varicose veins.   Musculoskeletal: exam reveals no obvious joint deformities, tenderness or joint swelling or erythema.   Neurologically:  Mental status: The patient is awake, alert and oriented in all 4 spheres. Her immediate and remote memory, attention, language skills and fund of knowledge are appropriate. There is no evidence of aphasia, agnosia, apraxia or anomia. Speech is clear with normal prosody and enunciation. Thought process is linear. Mood is normal and affect is normal.  Cranial nerves II - XII are as described above under HEENT exam. In addition: shoulder shrug is normal with equal shoulder height noted. Motor exam: Normal bulk, strength and tone is noted. There is no drift, tremor or rebound. Romberg is negative. Reflexes are 2 to 3+ throughout. Babinski: Toes are flexor bilaterally. Fine motor skills and coordination: intact with normal finger taps, normal hand movements, normal rapid alternating patting, normal foot taps and normal foot agility.  Cerebellar testing: No dysmetria or intention tremor on finger to nose testing. Heel to shin is unremarkable bilaterally. There is no truncal or gait ataxia.  Sensory exam: intact to light touch, vibration, temperature sense in the upper and lower extremities.  Gait, station and balance: She stands easily. No veering to one side is noted. No leaning to one side is noted. Posture is age-appropriate and stance is narrow based. Gait shows normal stride length and normal pace. No problems turning are noted. She turns en bloc. Tandem walk is unremarkable.           Assessment and Plan:   In summary, Dorothy [name] is a very  pleasant 59 year old female with an underlying complex medical history of diabetes, hyperlipidemia, depression, fatty liver, reflux disease, anxiety, gallstones, migraine headaches, osteoarthritis, peptic ulcer disease, vitamin B12 deficiency, Mnire's disease and benign positional vertigo, allergic rhinitis, hiatal hernia and fibromyalgia, who presents for followup consultation of her restless leg syndrome and PLMs. In May 2015, she started Neupro and titrated this up to 2 mg once daily with improvement in her RLS symptoms and better sleep but she developed itching and burning at the site of the patches and had to stop it in 4/16. I then started her on low-dose Mirapex starting at 0.125 mg each night. She  was advised to gradually titrate this to up to 3 pills each night. Currently she is taking 2 pills each night with good results. She has no side effects. Her exam is stable. She had no recent issues  with her congestive heart failure. At this juncture, I her back infrequently, perhaps in a year. She is advised to continue with the low-dose Mirapex and she can take up to 0.375 mg each night but seems to do well on the lower dose. I provided her with a refill for this and written instructions. She is advised to call with any interim questions or concerns. I spent 15 minutes in total face-to-face time with the patient, more than 50% of which was spent in counseling and coordination of care, reviewing test results, reviewing medication and discussing or reviewing the diagnosis of RLS, and PLMD, the prognosis and treatment options.

## 2015-07-09 ENCOUNTER — Telehealth: Payer: Self-pay

## 2015-07-09 NOTE — Telephone Encounter (Signed)
Notes Recorded by [name], MD on 06/23/2015 at 10:04 AM 1. No evidence of hepatitis C or HIV. 2. Hemoglobin A1c is under excellent control at 6.3; keep up the good work! Sugar is elevated at 135. 3. One liver function test slightly increased; recommend repeating at next visit. 4. Kidney function is normal. 5. Cholesterol is under excellent control. 6. Thyroid is remaining slightly abnormal but again not high enough to start medication; we will continue to monitor at each visit. (MyChart)  Pt called requesting lab results. Left detailed VM with results and also that pt can view results on MyChart.

## 2015-07-16 ENCOUNTER — Other Ambulatory Visit: Payer: Self-pay | Admitting: Family Medicine

## 2015-07-27 ENCOUNTER — Ambulatory Visit
Admission: RE | Admit: 2015-07-27 | Discharge: 2015-07-27 | Disposition: A | Discharge status: Home / Self Care | Payer: 59 | Source: Ambulatory Visit

## 2015-07-27 DIAGNOSIS — Z1231 Encounter for screening mammogram for malignant neoplasm of breast: Secondary | ICD-10-CM

## 2015-07-28 ENCOUNTER — Other Ambulatory Visit: Payer: Self-pay | Admitting: Family Medicine

## 2015-07-30 ENCOUNTER — Other Ambulatory Visit: Payer: Self-pay | Admitting: Family Medicine

## 2015-08-01 ENCOUNTER — Other Ambulatory Visit: Payer: Self-pay | Admitting: Family Medicine

## 2015-08-11 ENCOUNTER — Other Ambulatory Visit: Payer: Self-pay | Admitting: Family Medicine

## 2015-08-13 ENCOUNTER — Other Ambulatory Visit: Payer: Self-pay | Admitting: Physician Assistant

## 2015-08-28 ENCOUNTER — Other Ambulatory Visit: Payer: Self-pay | Admitting: Family Medicine

## 2015-09-01 ENCOUNTER — Other Ambulatory Visit: Payer: Self-pay | Admitting: Physician Assistant

## 2015-09-13 ENCOUNTER — Other Ambulatory Visit: Payer: Self-pay | Admitting: Family Medicine

## 2015-09-15 ENCOUNTER — Other Ambulatory Visit: Payer: Self-pay | Admitting: Family Medicine

## 2015-09-27 ENCOUNTER — Encounter: Payer: Self-pay | Admitting: Family Medicine

## 2015-09-27 ENCOUNTER — Ambulatory Visit (INDEPENDENT_AMBULATORY_CARE_PROVIDER_SITE_OTHER): Payer: 59 | Admitting: Family Medicine

## 2015-09-27 VITALS — BP 113/71 | HR 66 | Temp 98.1°F | Resp 16 | Ht 65.25 in | Wt 172.6 lb

## 2015-09-27 DIAGNOSIS — F411 Generalized anxiety disorder: Secondary | ICD-10-CM | POA: Diagnosis not present

## 2015-09-27 DIAGNOSIS — I5032 Chronic diastolic (congestive) heart failure: Secondary | ICD-10-CM

## 2015-09-27 DIAGNOSIS — R7989 Other specified abnormal findings of blood chemistry: Secondary | ICD-10-CM | POA: Diagnosis not present

## 2015-09-27 DIAGNOSIS — E785 Hyperlipidemia, unspecified: Secondary | ICD-10-CM

## 2015-09-27 DIAGNOSIS — E1121 Type 2 diabetes mellitus with diabetic nephropathy: Secondary | ICD-10-CM

## 2015-09-27 DIAGNOSIS — M797 Fibromyalgia: Secondary | ICD-10-CM

## 2015-09-27 LAB — HEMOGLOBIN A1C
HEMOGLOBIN A1C: 6.7 % — AB (ref <5.7)
MEAN PLASMA GLUCOSE: 146 mg/dL — AB (ref <117)

## 2015-09-27 LAB — COMPREHENSIVE METABOLIC PANEL
ALBUMIN: 4.3 g/dL (ref 3.6–5.1)
ALK PHOS: 63 U/L (ref 33–130)
ALT: 31 U/L — AB (ref 6–29)
AST: 21 U/L (ref 10–35)
BILIRUBIN TOTAL: 0.5 mg/dL (ref 0.2–1.2)
BUN: 20 mg/dL (ref 7–25)
CALCIUM: 9.5 mg/dL (ref 8.6–10.4)
CO2: 27 mmol/L (ref 20–31)
Chloride: 102 mmol/L (ref 98–110)
Creat: 0.97 mg/dL (ref 0.50–1.05)
GLUCOSE: 138 mg/dL — AB (ref 65–99)
Potassium: 4 mmol/L (ref 3.5–5.3)
Sodium: 138 mmol/L (ref 135–146)
TOTAL PROTEIN: 6.4 g/dL (ref 6.1–8.1)

## 2015-09-27 LAB — CBC WITH DIFFERENTIAL/PLATELET
BASOS ABS: 0 10*3/uL (ref 0.0–0.1)
Basophils Relative: 0 % (ref 0–1)
EOS PCT: 3 % (ref 0–5)
Eosinophils Absolute: 0.2 10*3/uL (ref 0.0–0.7)
HEMATOCRIT: 41.9 % (ref 36.0–46.0)
Hemoglobin: 14 g/dL (ref 12.0–15.0)
LYMPHS ABS: 1.7 10*3/uL (ref 0.7–4.0)
LYMPHS PCT: 32 % (ref 12–46)
MCH: 31 pg (ref 26.0–34.0)
MCHC: 33.4 g/dL (ref 30.0–36.0)
MCV: 92.9 fL (ref 78.0–100.0)
MONOS PCT: 7 % (ref 3–12)
MPV: 10.8 fL (ref 8.6–12.4)
Monocytes Absolute: 0.4 10*3/uL (ref 0.1–1.0)
Neutro Abs: 3 10*3/uL (ref 1.7–7.7)
Neutrophils Relative %: 58 % (ref 43–77)
Platelets: 199 10*3/uL (ref 150–400)
RBC: 4.51 MIL/uL (ref 3.87–5.11)
RDW: 13.1 % (ref 11.5–15.5)
WBC: 5.2 10*3/uL (ref 4.0–10.5)

## 2015-09-27 LAB — LIPID PANEL
Cholesterol: 152 mg/dL (ref 125–200)
HDL: 61 mg/dL (ref >=46)
LDL CALC: 67 mg/dL (ref <130)
TRIGLYCERIDES: 122 mg/dL (ref <150)
Total CHOL/HDL Ratio: 2.5 Ratio (ref <=5.0)
VLDL: 24 mg/dL (ref <30)

## 2015-09-27 LAB — TSH: TSH: 4.002 u[IU]/mL (ref 0.350–4.500)

## 2015-09-27 LAB — T4, FREE: Free T4: 0.89 ng/dL (ref 0.80–1.80)

## 2015-09-27 MED ORDER — ESOMEPRAZOLE MAGNESIUM 40 MG PO CPDR: 40.0000 mg | DELAYED_RELEASE_CAPSULE | Freq: Every day | ORAL | Status: DC

## 2015-09-27 NOTE — Patient Instructions (Signed)
Food Choices for Gastroesophageal Reflux Disease, Adult When you have gastroesophageal reflux disease (GERD), the foods you eat and your eating habits are very important. Choosing the right foods can help ease the discomfort of GERD. WHAT GENERAL GUIDELINES DO I NEED TO FOLLOW?  Choose fruits, vegetables, whole grains, low-fat dairy products, and low-fat meat, fish, and poultry.  Limit fats such as oils, salad dressings, butter, nuts, and avocado.  Keep a food diary to identify foods that cause symptoms.  Avoid foods that cause reflux. These may be different for different people.  Eat frequent small meals instead of three large meals each day.  Eat your meals slowly, in a relaxed setting.  Limit fried foods.  Cook foods using methods other than frying.  Avoid drinking alcohol.  Avoid drinking large amounts of liquids with your meals.  Avoid bending over or lying down until 2-3 hours after eating. WHAT FOODS ARE NOT RECOMMENDED? The following are some foods and drinks that may worsen your symptoms: Vegetables Tomatoes. Tomato juice. Tomato and spaghetti sauce. Chili peppers. Onion and garlic. Horseradish. Fruits Oranges, grapefruit, and lemon (fruit and juice). Meats High-fat meats, fish, and poultry. This includes hot dogs, ribs, ham, sausage, salami, and bacon. Dairy Whole milk and chocolate milk. Sour cream. Cream. Butter. Ice cream. Cream cheese.  Beverages Coffee and tea, with or without caffeine. Carbonated beverages or energy drinks. Condiments Hot sauce. Barbecue sauce.  Sweets/Desserts Chocolate and cocoa. Donuts. Peppermint and spearmint. Fats and Oils High-fat foods, including French fries and potato chips. Other Vinegar. Strong spices, such as black pepper, white pepper, red pepper, cayenne, curry powder, cloves, ginger, and chili powder. The items listed above may not be a complete list of foods and beverages to avoid. Contact your dietitian for more  information.   This information is not intended to replace advice given to you by your health care provider. Make sure you discuss any questions you have with your health care provider.   Document Released: 08/14/2005 Document Revised: 09/04/2014 Document Reviewed: 06/18/2013 Else[employer] Interactive Patient Education 2016 [employer]  

## 2015-09-27 NOTE — Progress Notes (Signed)
Subjective:    Patient ID: Dorothy [name], female    DOB: [birth year], 60 y.o.   MRN: [id number]  09/27/2015  Follow-up and Medication Refill   HPI This 60 y.o. female presents for three month follow-up:   1. DMII: Patient reports good compliance with medication, good tolerance to medication, and good symptom control.  Not checking sugars.  2.  Dyslipidemia: Patient reports good compliance with medication, good tolerance to medication, and good symptom control.  Tro  3.  Anxiety and depression: Patient reports good compliance with medication, good tolerance to medication, and good symptom control.  Has learned to knit.  Having knitting classes.  Going to the gym three times per week. 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  Has been buying methadone for Aaron Edelman.  Takes entire check.  Vomits all the time; cannot keep anything down; eating popcorn, grits, cereal.  Has been weaning down on Methadone; down to 130mg  daily; so irritable all the time.  Eats tums, zantac a day.    4.  Fibromyalgia:   5. GERD: reflux is uncontrolled; major stressors right now.  Dexilant is not effective.     Review of Systems  Constitutional: Negative for fever, chills, diaphoresis and fatigue.  Eyes: Negative for visual disturbance.  Respiratory: Negative for cough and shortness of breath.   Cardiovascular: Negative for chest pain, palpitations and leg swelling.  Gastrointestinal: Negative for nausea, vomiting, abdominal pain, diarrhea and constipation.  Endocrine: Negative for cold intolerance, heat intolerance, polydipsia, polyphagia and polyuria.  Neurological: Negative for dizziness, tremors, seizures, syncope, facial asymmetry, speech difficulty, weakness, light-headedness, numbness and headaches.    Past Medical History  Diagnosis Date  . Fatty liver   . GERD (gastroesophageal reflux disease)   . Gastritis   . Hyperlipidemia   . Depression   . Anxiety   . Choledocholithiasis   . Hx of  adenomatous colonic polyps   . Diabetes mellitus   . Migraine   . Osteoarthritis   . Peptic ulcer, unspecified site, unspecified as acute or chronic, without mention of hemorrhage, perforation, or obstruction   . Otosclerosis, unspecified   . B12 deficiency   . Hematuria, unspecified   . Unspecified disorder of kidney and ureter   . Osteoporosis, unspecified   . Meniere's disease, unspecified   . Benign paroxysmal positional vertigo   . Symptomatic menopausal or female climacteric states   . Allergic rhinitis, cause unspecified   . Fatty liver 09/04/12  . Hiatal hernia   . Fibromyalgia   . Mononeuritis of unspecified site   . Hx of echocardiogram     Echo (12/15):  EF 60-65%, no RWMA, normal diast function, mild RVE  . Hx of cardiovascular stress test     ETT-Myoview (12/15):  No ischemia.  No ECG changes.  EF 73%.  Low Risk.   . Congestive heart failure [facility])    Past Surgical History  Procedure Laterality Date  . Cholecystectomy    . Tubal ligation    . Shoulder surgery      left  . Breast enhancement surgery Bilateral   . Foot surgery      right  . Mouth surgery    . Bile duct exploration      gallstone removed  . Total abdominal hysterectomy      ovaries intact  . Maxillary le forte i osteotomy Bilateral 06/19/2013    Procedure: BILATERAL SAGITTAL SPLIT OSTEOMY WITH RIGID FIXATION;  Surgeon: Michaela Corner, DDS;  Location: [facility];  Service: Oral Surgery;  Laterality: Bilateral;   Allergies  Allergen Reactions  . Bactrim [Sulfamethoxazole-Trimethoprim] Other (See Comments)    blisters  . Keflex [Cephalexin] Hives  . Macrobid [Nitrofurantoin Macrocrystal] Other (See Comments)    blisters  . Nitrofurantoin Nausea And Vomiting and Other (See Comments)    Blisters   . Sulfa Drugs Cross Reactors Itching  . Adhesive [Tape] Other (See Comments)    Tears skins  . Codeine Itching  . Talwin [Pentazocine] Nausea And Vomiting and Rash    Muscle cramps and vision changes     Current Outpatient Prescriptions  Medication Sig Dispense Refill  . aspirin 81 MG tablet Take 81 mg by mouth every evening.    . Calcium Carb-Cholecalciferol (CALCIUM 500 +D) 500-400 MG-UNIT TABS Take 1 tablet by mouth daily.     . Cholecalciferol (VITAMIN D3) 2000 UNITS TABS Take 2,000 Units by mouth daily.     . Cyanocobalamin (VITAMIN B 12 PO) Take 1,500 mg by mouth daily.    [name] DEXILANT 60 MG capsule TAKE ONE CAPSULE BY MOUTH DAILY 30 capsule 0  . fluticasone (FLONASE) 50 MCG/ACT nasal spray Place 2 sprays into both nostrils daily. 16 g 11  . furosemide (LASIX) 20 MG tablet TAKE 1 TABLET (20 MG TOTAL) BY MOUTH DAILY. 30 tablet 11  . JANUVIA 100 MG tablet TAKE 1 TABLET BY MOUTH DAILY` 30 tablet 0  . loperamide (IMODIUM) 2 MG capsule Take 1 capsule (2 mg total) by mouth 2 (two) times daily as needed for diarrhea or loose stools. 90 capsule 3  . losartan (COZAAR) 50 MG tablet TAKE 1 TABLET (50 MG TOTAL) BY MOUTH DAILY. 30 tablet 3  . metFORMIN (GLUCOPHAGE) 1000 MG tablet TAKE 1 TABLET (1,000 MG TOTAL) BY MOUTH 2 (TWO) TIMES DAILY WITH A MEAL 60 tablet 11  . omega-3 acid ethyl esters (LOVAZA) 1 G capsule Take 1 g by mouth 2 (two) times daily.    . Potassium Chloride ER 20 MEQ TBCR Take 40 mEq by mouth daily. 60 tablet 5  . pramipexole (MIRAPEX) 0.125 MG tablet Take 3 tablets (0.375 mg total) by mouth at bedtime. 90 tablet 5  . pregabalin (LYRICA) 50 MG capsule Take 50 mg by mouth 2 (two) times daily.    [name] PREMARIN 0.45 MG tablet TAKE 1 TABLET BY MOUTH DAILY 30 tablet 0  . simvastatin (ZOCOR) 40 MG tablet TAKE 1 TABLET BY MOUTH AT BEDTIME 30 tablet 0  . venlafaxine XR (EFFEXOR-XR) 75 MG 24 hr capsule Take 1 capsule (75 mg total) by mouth every evening. 90 capsule 3  . VOLTAREN 1 % GEL Apply 2 g topically 2 (two) times daily as needed (pain).     . ranitidine (ZANTAC) 300 MG tablet TAKE 1 TABLET (300 MG TOTAL) BY MOUTH AT BEDTIME (Patient not taking: Reported on 09/27/2015) 60 tablet 0   No  current facility-administered medications for this visit.   Social History   Social History  . Marital Status: Married    Spouse Name: Richardson Landry  . Number of Children: 3  . Years of Education: 10th grade   Occupational History  .     Social History Main Topics  . Smoking status: Never Smoker   . Smokeless tobacco: Never Used  . Alcohol Use: 0.0 oz/week    0 Standard drinks or equivalent per week     Comment: occasional 1 x month  . Drug Use: No  . Sexual Activity: Yes   Other Topics  Concern  . Not on file   Social History Narrative      Marital status:  Married x 20 years; happily married,no abuse. 2nd marriage.      Children: 3 sons (8, 24, 36)---2 sons with substance abuse; 3 grandchildren; 3 step grandchildren; 2 gg.      Lives: with husband, son.      Tobacco:none       Alcohol:   none      Drugs:none      Exercise: exercising at gym.        Seatbelt: 100%      Guns: none      Always uses seat belts. Smoke alarm and carbon monoxide detector in the home.No caffeine use. No unsecured guns in the home.    Family History  Problem Relation Age of Onset  . Colon cancer Cousin     died age 52  . Prostate cancer Father   . Lung disease Father   . Diabetes Father   . Heart disease Father 55    CHF, AMI multiple/CABG  . COPD Father   . Cancer Father     prostate cancer  . Heart attack Father   . Breast cancer Maternal Aunt   . Diabetes Mother   . Hypertension Mother   . Hyperlipidemia Mother   . COPD Mother   . Cancer Mother     lung cancer  . Diabetes Brother   . Hypertension Brother   . Obesity Brother   . Hyperlipidemia Brother   . Stroke Maternal Grandmother   . Heart disease Maternal Grandfather   . COPD Paternal Grandmother   . Diabetes Paternal Grandmother   . COPD Paternal Grandfather   . Heart attack Paternal Grandfather        Objective:    BP 113/71 mmHg  Pulse 66  Temp(Src) 98.1 F (36.7 C) (Oral)  Resp 16  Ht 5' 5.25" (1.657 m)  Wt  172 lb 9.6 oz (78.291 kg)  BMI 28.51 kg/m2 Physical Exam  Constitutional: She is oriented to person, place, and time. She appears well-developed and well-nourished. No distress.  HENT:  Head: Normocephalic and atraumatic.  Right Ear: External ear normal.  Left Ear: External ear normal.  Nose: Nose normal.  Mouth/Throat: Oropharynx is clear and moist.  Eyes: Conjunctivae and EOM are normal. Pupils are equal, round, and reactive to light.  Neck: Normal range of motion. Neck supple. Carotid bruit is not present. No thyromegaly present.  Cardiovascular: Normal rate, regular rhythm, normal heart sounds and intact distal pulses.  Exam reveals no gallop and no friction rub.   No murmur heard. Pulmonary/Chest: Effort normal and breath sounds normal. She has no wheezes. She has no rales.  Abdominal: Soft. Bowel sounds are normal. She exhibits no distension and no mass. There is no tenderness. There is no rebound and no guarding.  Lymphadenopathy:    She has no cervical adenopathy.  Neurological: She is alert and oriented to person, place, and time. No cranial nerve deficit.  Skin: Skin is warm and dry. No rash noted. She is not diaphoretic. No erythema. No pallor.  Psychiatric: She has a normal mood and affect. Her behavior is normal.   Results for orders placed or performed in visit on 06/14/15  Comprehensive metabolic panel  Result Value Ref Range   Sodium 138 135 - 146 mmol/L   Potassium 4.7 3.5 - 5.3 mmol/L   Chloride 102 98 - 110 mmol/L   CO2 27 20 -  31 mmol/L   Glucose, Bld 135 (H) 65 - 99 mg/dL   BUN 19 7 - 25 mg/dL   Creat 1.03 0.50 - 1.05 mg/dL   Total Bilirubin 0.7 0.2 - 1.2 mg/dL   Alkaline Phosphatase 63 33 - 130 U/L   AST 22 10 - 35 U/L   ALT 34 (H) 6 - 29 U/L   Total Protein 6.6 6.1 - 8.1 g/dL   Albumin 4.5 3.6 - 5.1 g/dL   Calcium 9.7 8.6 - 10.4 mg/dL  Hemoglobin A1c  Result Value Ref Range   Hgb A1c MFr Bld 6.3 (H) <5.7 %   Mean Plasma Glucose 134 (H) <117 mg/dL    Lipid panel  Result Value Ref Range   Cholesterol 175 125 - 200 mg/dL   Triglycerides 162 (H) <150 mg/dL   HDL 70 >=46 mg/dL   Total CHOL/HDL Ratio 2.5 <=5.0 Ratio   VLDL 32 (H) <30 mg/dL   LDL Cholesterol 73 <130 mg/dL  HIV antibody  Result Value Ref Range   HIV 1&2 Ab, 4th Generation NONREACTIVE NONREACTIVE  Hepatitis C antibody  Result Value Ref Range   HCV Ab NEGATIVE NEGATIVE  TSH  Result Value Ref Range   TSH 5.386 (H) 0.350 - 4.500 uIU/mL       Assessment & Plan:   1. Type 2 diabetes mellitus with diabetic nephropathy, without long-term current use of insulin ([place])   2. Chronic diastolic heart failure ([place])   3. Anxiety state   4. HLD (hyperlipidemia)   5. Fibromyalgia   6. Abnormal TSH     Orders Placed This Encounter  Procedures  . CBC with Differential/Platelet  . Comprehensive metabolic panel    Order Specific Question:  Has the patient fasted?    Answer:  Yes  . Hemoglobin A1c  . Lipid panel    Order Specific Question:  Has the patient fasted?    Answer:  Yes  . TSH  . T4, free   No orders of the defined types were placed in this encounter.    No Follow-up on file.    Symiah Nowotny Elayne Guerin, M.D. [facility] [address] [place], [place]  [address] [phone] phone [phone] fax

## 2015-09-28 ENCOUNTER — Other Ambulatory Visit: Payer: Self-pay | Admitting: Family Medicine

## 2015-10-28 ENCOUNTER — Other Ambulatory Visit: Payer: Self-pay | Admitting: Family Medicine

## 2015-11-16 ENCOUNTER — Telehealth: Payer: Self-pay | Admitting: Gastroenterology

## 2015-11-16 NOTE — Telephone Encounter (Signed)
Spoke with patient and she would like to have Dr. Silverio Decamp as her new GI MD. Patient is asking for refills for Imodium one po BID prn diarrhea. She takes this for postcholescystectomy diarrhea contributed by Metformin. Please, advise if this ok.

## 2015-11-17 ENCOUNTER — Telehealth: Payer: Self-pay

## 2015-11-17 NOTE — Telephone Encounter (Addendum)
Pt states she would like [place] to fax over her blood work to Dr Myrla Halsted office in order for her to get a refill on her medicines. Please fax to [phone] and their phone number is [phone] You may reach pt at [phone] if needed. Pt was told medical records do things in order

## 2015-11-22 ENCOUNTER — Encounter: Payer: Self-pay | Admitting: *Deleted

## 2015-11-22 MED ORDER — LOPERAMIDE HCL 2 MG PO CAPS: 2.0000 mg | ORAL_CAPSULE | Freq: Two times a day (BID) | ORAL | Status: DC | PRN

## 2015-11-22 NOTE — Telephone Encounter (Signed)
Ok to refill, please schedule office visit to discuss other options. Thanks

## 2015-11-22 NOTE — Telephone Encounter (Signed)
Rx sent to pharmacy. Left a message that Rx has been. Mailed a letter with OV appointment on 01/25/16 at 8:45 AM.

## 2015-11-27 ENCOUNTER — Other Ambulatory Visit: Payer: Self-pay | Admitting: Family Medicine

## 2015-12-06 ENCOUNTER — Other Ambulatory Visit: Payer: Self-pay | Admitting: Family Medicine

## 2016-01-16 ENCOUNTER — Other Ambulatory Visit: Payer: Self-pay | Admitting: Family Medicine

## 2016-01-17 NOTE — Telephone Encounter (Signed)
Pt has appt sch 04/18/16. Do you want to RF until then?

## 2016-01-17 NOTE — Telephone Encounter (Signed)
Refills approved and sent to pharmacy

## 2016-01-19 ENCOUNTER — Other Ambulatory Visit: Payer: Self-pay

## 2016-01-19 DIAGNOSIS — Z1231 Encounter for screening mammogram for malignant neoplasm of breast: Secondary | ICD-10-CM

## 2016-01-25 ENCOUNTER — Ambulatory Visit: Payer: 59 | Admitting: Family Medicine

## 2016-01-25 ENCOUNTER — Ambulatory Visit: Payer: 59 | Admitting: Gastroenterology

## 2016-01-26 ENCOUNTER — Telehealth: Payer: Self-pay | Admitting: Cardiovascular Disease

## 2016-01-26 NOTE — Telephone Encounter (Signed)
I spoke with the pt and she has noticed over the past 3 weeks that she has not been urinating as much as her normal.  The pt denies SOB, swelling or weight gain. I advised the pt that her urine out put can be effected by a number of things including how much she is drinking or if she is sweating due to activity.  The pt will continue to monitor for SOB, swelling or weight gain and if she notes any of these symptoms then she will contact the office.  The pt is currently on the wait list for an appointment with Dr Burt Knack.

## 2016-01-26 NOTE — Telephone Encounter (Signed)
New message   Pt wants rn to call her because she has not been urinating enough in the last 3 or 4 days

## 2016-02-26 ENCOUNTER — Other Ambulatory Visit: Payer: Self-pay | Admitting: Gastroenterology

## 2016-03-09 ENCOUNTER — Other Ambulatory Visit: Payer: Self-pay

## 2016-03-09 MED ORDER — FUROSEMIDE 20 MG PO TABS: ORAL_TABLET | ORAL | Status: DC

## 2016-03-09 MED ORDER — POTASSIUM CHLORIDE ER 20 MEQ PO TBCR: 40.0000 meq | EXTENDED_RELEASE_TABLET | Freq: Every day | ORAL | Status: DC

## 2016-03-11 ENCOUNTER — Other Ambulatory Visit: Payer: Self-pay | Admitting: Family Medicine

## 2016-03-20 ENCOUNTER — Ambulatory Visit (INDEPENDENT_AMBULATORY_CARE_PROVIDER_SITE_OTHER): Payer: 59 | Admitting: Gastroenterology

## 2016-03-20 ENCOUNTER — Encounter: Payer: Self-pay | Admitting: Gastroenterology

## 2016-03-20 VITALS — BP 114/72 | HR 84 | Ht 64.25 in | Wt 174.4 lb

## 2016-03-20 DIAGNOSIS — R197 Diarrhea, unspecified: Secondary | ICD-10-CM

## 2016-03-20 DIAGNOSIS — K219 Gastro-esophageal reflux disease without esophagitis: Secondary | ICD-10-CM

## 2016-03-20 DIAGNOSIS — R159 Full incontinence of feces: Secondary | ICD-10-CM

## 2016-03-20 MED ORDER — CHOLESTYRAMINE LIGHT 4 G PO POWD: 4.0000 g | Freq: Two times a day (BID) | ORAL | 3 refills | Status: DC

## 2016-03-20 NOTE — Patient Instructions (Signed)
We will refer you to [facility] for physical therapy they will contact you with that appointment  Call us back when you are ready to schedule your colonoscopy   It has been recommended to you by your physician that you have a(n) Colonoscopy completed. Per your request, we did not schedule the procedure(s) today. Please contact our office at [phone] should you decide to have the procedure completed.  We will send in your new prescription to your pharmacy

## 2016-03-27 NOTE — Progress Notes (Signed)
Dorothy [name]    [id number]    [birth year]  Primary Care Physician:SMITH,KRISTI, MD  Referring Physician: [name], MD [address] [place], [place] [address]  Chief complaint:  Diarrhea, fecal incontinence  HPI:  60 year old white female previously followed by Dr. Olevia Perches with history of chronic diarrhea on Imodium and also with history of positive H. pylori antibody. Gastric   biopsies indicated reactive gastropathy. Negative for H.Pylori. Prior endoscopy was in 2008. She is currently on Dexilant 60 mg in the morning and ranitidine 300 mg at bedtime. Her symptoms are much improved The patient has a history of of lap cholecystectomy and has never tried cholestyramine .Last abdominal ultrasound in 2014 revealed 7.5 mm common bile duct and fatty liver. Her colonoscopy which was done in November 2011 and prior to that in 2006 with findings of hyperplastic polyp and most recently 2 polyps which confirmed only polypoid mucosa. Biopsies were not taken at the time to exclude microscopic colitis. She is currently taking 1-2 loperamide with control of symptoms but continues to have significant effect on her lifestyle and intermittent fecal incontinence and urgency   Outpatient Encounter Prescriptions as of 03/20/2016  Medication Sig  . aspirin 81 MG tablet Take 81 mg by mouth every evening.  . Calcium Carb-Cholecalciferol (CALCIUM 500 +D) 500-400 MG-UNIT TABS Take 1 tablet by mouth daily.   . Cholecalciferol (VITAMIN D3) 2000 UNITS TABS Take 2,000 Units by mouth daily.   [name] DEXILANT 60 MG capsule TAKE ONE CAPSULE BY MOUTH DAILY  . fluticasone (FLONASE) 50 MCG/ACT nasal spray Place 2 sprays into both nostrils daily.  . furosemide (LASIX) 20 MG tablet TAKE 1 TABLET (20 MG TOTAL) BY MOUTH DAILY.  [name] JANUVIA 100 MG tablet TAKE 1 TABLET BY MOUTH DAILY  . loperamide (IMODIUM) 2 MG capsule TAKE 1 CAPSULE (2 MG TOTAL) BY MOUTH 2 (TWO) TIMES DAILY AS NEEDED FOR DIARRHEA OR LOOSE STOOLS.  [name]  losartan (COZAAR) 50 MG tablet TAKE 1 TABLET (50 MG TOTAL) BY MOUTH DAILY.  . metFORMIN (GLUCOPHAGE) 1000 MG tablet TAKE 1 TABLET (1,000 MG TOTAL) BY MOUTH 2 (TWO) TIMES DAILY WITH A MEAL  . omega-3 acid ethyl esters (LOVAZA) 1 G capsule Take 1 g by mouth 2 (two) times daily.  . Potassium Chloride ER 20 MEQ TBCR Take 40 mEq by mouth daily.  . pramipexole (MIRAPEX) 0.125 MG tablet Take 3 tablets (0.375 mg total) by mouth at bedtime.  . pregabalin (LYRICA) 50 MG capsule Take 50 mg by mouth 2 (two) times daily.  [name] PREMARIN 0.45 MG tablet TAKE 1 TABLET BY MOUTH DAILY  . simvastatin (ZOCOR) 40 MG tablet TAKE 1 TABLET BY MOUTH AT BEDTIME  . venlafaxine XR (EFFEXOR-XR) 75 MG 24 hr capsule Take 1 capsule (75 mg total) by mouth every evening.  [name] VOLTAREN 1 % GEL Apply 2 g topically 2 (two) times daily as needed (pain).   . cholestyramine light 4 g POWD Take 1 packet (4 g total) by mouth 2 (two) times daily.  . [DISCONTINUED] Cyanocobalamin (VITAMIN B 12 PO) Take 1,500 mg by mouth daily.  . [DISCONTINUED] esomeprazole (NEXIUM) 40 MG capsule Take 1 capsule (40 mg total) by mouth daily at 12 noon.  . [DISCONTINUED] ranitidine (ZANTAC) 300 MG tablet TAKE 1 TABLET (300 MG TOTAL) BY MOUTH AT BEDTIME (Patient not taking: Reported on 09/27/2015)   No facility-administered encounter medications on file as of 03/20/2016.     Allergies  as of 03/20/2016 - Review Complete 03/20/2016  Allergen Reaction Noted  . Bactrim [sulfamethoxazole-trimethoprim] Other (See Comments) 06/10/2013  . Keflex [cephalexin] Hives 07/28/2013  . Macrobid [nitrofurantoin macrocrystal] Other (See Comments) 06/10/2013  . Nitrofurantoin Nausea And Vomiting and Other (See Comments)   . Sulfa drugs cross reactors Itching 05/20/2012  . Adhesive [tape] Other (See Comments) 06/09/2013  . Codeine Itching   . Talwin [pentazocine] Nausea And Vomiting and Rash 05/20/2012    Past Medical History:  Diagnosis Date  . Allergic rhinitis, cause  unspecified   . Anxiety   . B12 deficiency   . Benign paroxysmal positional vertigo   . Choledocholithiasis   . Congestive heart failure ([place])   . Depression   . Diabetes mellitus   . Fatty liver   . Fatty liver 09/04/12  . Fibromyalgia   . Gastritis   . GERD (gastroesophageal reflux disease)   . Hematuria, unspecified   . Hiatal hernia   . Hx of adenomatous colonic polyps   . Hx of cardiovascular stress test    ETT-Myoview (12/15):  No ischemia.  No ECG changes.  EF 73%.  Low Risk.   [name] Hx of echocardiogram    Echo (12/15):  EF 60-65%, no RWMA, normal diast function, mild RVE  . Hyperlipidemia   . Meniere's disease, unspecified   . Migraine   . Mononeuritis of unspecified site   . Osteoarthritis   . Osteoporosis, unspecified   . Otosclerosis, unspecified   . Peptic ulcer, unspecified site, unspecified as acute or chronic, without mention of hemorrhage, perforation, or obstruction   . Symptomatic menopausal or female climacteric states   . Unspecified disorder of kidney and ureter     Past Surgical History:  Procedure Laterality Date  . BILE DUCT EXPLORATION     gallstone removed  . BREAST ENHANCEMENT SURGERY Bilateral   . CHOLECYSTECTOMY    . FOOT SURGERY     right  . 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 MOUTH SURGERY    . SHOULDER SURGERY     left  . TOTAL ABDOMINAL HYSTERECTOMY     ovaries intact  . TUBAL LIGATION      Family History  Problem Relation Age of Onset  . Prostate cancer Father   . Lung disease Father   . Diabetes Father   . Heart disease Father 64    CHF, AMI multiple/CABG  . COPD Father   . Cancer Father     prostate cancer  . Heart attack Father   . Diabetes Mother   . Hypertension Mother   . Hyperlipidemia Mother   . COPD Mother   . Cancer Mother     lung  cancer  . Diabetes Brother   . Hypertension Brother   . Obesity Brother   . Hyperlipidemia Brother   . Stroke Maternal Grandmother   . Heart disease Maternal Grandfather   . COPD Paternal Grandmother   . Diabetes Paternal Grandmother   . COPD Paternal Grandfather   . Heart attack Paternal Grandfather   . Colon cancer Cousin     died age 87  . Breast cancer Maternal Aunt     Social History   Social History  . Marital status: Married    Spouse name: Richardson Landry  . Number of children: 3  . Years of education:  10th grade   Occupational History  .  [employer]   Social History Main Topics  . Smoking status: Never Smoker  . Smokeless tobacco: Never Used  . Alcohol use 0.0 oz/week     Comment: occasional 1 x month  . Drug use: No  . Sexual activity: Yes   Other Topics Concern  . Not on file   Social History Narrative      Marital status:  Married x 20 years; happily married,no abuse. 2nd marriage.      Children: 3 sons (59, 5, 36)---2 sons with substance abuse; 3 grandchildren; 3 step grandchildren; 2 gg.      Lives: with husband, son.      Tobacco:none       Alcohol:   none      Drugs:none      Exercise: exercising at gym.        Seatbelt: 100%      Guns: none      Always uses seat belts. Smoke alarm and carbon monoxide detector in the home.No caffeine use. No unsecured guns in the home.       Review of systems: Review of Systems  Constitutional: Negative for fever and chills.  HENT: Negative.   Eyes: Negative for blurred vision.  Respiratory: Negative for cough, shortness of breath and wheezing.   Cardiovascular: Negative for chest pain and palpitations.  Gastrointestinal: as per HPI Genitourinary: Negative for dysuria, urgency, frequency and hematuria.  Musculoskeletal: Negative for myalgias, back pain and joint pain.  Skin: Negative for itching and rash.  Neurological: Negative for dizziness, tremors, focal weakness, seizures and loss of  consciousness.  Endo/Heme/Allergies: Negative for environmental allergies.  Psychiatric/Behavioral: Negative for depression, suicidal ideas and hallucinations.  All other systems reviewed and are negative.   Physical Exam: Vitals:   03/20/16 1402  BP: 114/72  Pulse: 84   Gen:      No acute distress HEENT:  EOMI, sclera anicteric Neck:     No masses; no thyromegaly Lungs:    Clear to auscultation bilaterally; normal respiratory effort CV:         Regular rate and rhythm; no murmurs Abd:      + bowel sounds; soft, non-tender; no palpable masses, no distension Ext:    No edema; adequate peripheral perfusion Skin:      Warm and dry; no rash Neuro: alert and oriented x 3 Psych: normal mood and affect  Data Reviewed:  Reviewed chart in epic   Assessment and Plan/Recommendations:  60 year old female status post cholecystectomy with complaints of chronic diarrhea and fecal incontinence here for follow-up visit Advise patient to continue lactose-free diet We'll start cholestyramine Schedule for colonoscopy to exclude microscopic colitis Referred to pelvic floor physical therapy to improve fecal incontinence and anal sphincter tone Okay to use Imodium as needed Refilled Dexilant for GERD Discussed antireflux measures  Greater than 50% of the time used for counseling  as well as treatment plan and follow-up. She had multiple questions which were answered to her satisfaction   Damaris Hippo , MD [phone] Mon-Fri 8a-5p [phone] after 5p, weekends, holidays  CC: [name], MD

## 2016-03-29 ENCOUNTER — Ambulatory Visit: Payer: 59 | Admitting: Physician Assistant

## 2016-04-04 NOTE — Progress Notes (Signed)
Cardiology Office Note    Date:  04/11/2016   ID:  CATENA PETRUZZELLI, DOB Jan 23, 1956, MRN [id number]  PCP:  Reginia Forts, MD  Cardiologist:  Dr. Burt Knack  CC:  1 year follow up.   History of Present Illness:  Dorothy [name] is a 60 y.o. female with a history of DMT2, HLD, chronic diastolic CHF who presents to clinic for follow up.   She had an episode of shortness of breath and was evaluated in the emergency room in 06/2014, at which time her BNP was elevated and she was started on Lasix. She had clinical improvement with a diuretic and at the time of follow-up her BNP had normalized. An echocardiogram showed normal left ventricular function. A nuclear scan showed no ischemia.  Today she presents to clinic for follow up. No CP or SOB. But she does have some SOB when walking on an incline. She joined the gym about 1 year ago but hasn't gone as a much since starting a new job. She works as a Management consultant. She does try to watch her salt. No LE edema, orthopnea or PND. No dizziness or syncope. No blood in stool or urine. She has had some extra stress at work lately with more hours.   Past Medical History:  Diagnosis Date  . Allergic rhinitis, cause unspecified   . Anxiety   . B12 deficiency   . Benign paroxysmal positional vertigo   . Choledocholithiasis   . Congestive heart failure ([place])   . Depression   . Diabetes mellitus   . Fatty liver   . Fatty liver 09/04/12  . Fibromyalgia   . Gastritis   . GERD (gastroesophageal reflux disease)   . Hematuria, unspecified   . Hiatal hernia   . Hx of adenomatous colonic polyps   . Hx of cardiovascular stress test    ETT-Myoview (12/15):  No ischemia.  No ECG changes.  EF 73%.  Low Risk.   [name] Hx of echocardiogram    Echo (12/15):  EF 60-65%, no RWMA, normal diast function, mild RVE  . Hyperlipidemia   . Meniere's disease, unspecified   . Migraine   . Mononeuritis of unspecified site   . Osteoarthritis   . Osteoporosis,  unspecified   . Otosclerosis, unspecified   . Peptic ulcer, unspecified site, unspecified as acute or chronic, without mention of hemorrhage, perforation, or obstruction   . Symptomatic menopausal or female climacteric states   . Unspecified disorder of kidney and ureter     Past Surgical History:  Procedure Laterality Date  . BILE DUCT EXPLORATION     gallstone removed  . BREAST ENHANCEMENT SURGERY Bilateral   . CHOLECYSTECTOMY    . FOOT SURGERY     right  . 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 MOUTH SURGERY    . SHOULDER SURGERY     left  . TOTAL ABDOMINAL HYSTERECTOMY     ovaries intact  . TUBAL LIGATION      Current Medications: Outpatient Medications Prior to Visit  Medication Sig Dispense Refill  . aspirin 81 MG tablet Take 81 mg by mouth every evening.    . Calcium Carb-Cholecalciferol (CALCIUM 500 +D) 500-400 MG-UNIT TABS Take 1 tablet by mouth daily.     . Cholecalciferol (VITAMIN D3) 2000 UNITS TABS Take 2,000 Units  by mouth daily.     . cholestyramine light 4 g POWD Take 1 packet (4 g total) by mouth 2 (two) times daily. 90 packet 3  . DEXILANT 60 MG capsule TAKE ONE CAPSULE BY MOUTH DAILY 30 capsule 5  . fluticasone (FLONASE) 50 MCG/ACT nasal spray Place 2 sprays into both nostrils daily. 16 g 11  . furosemide (LASIX) 20 MG tablet TAKE 1 TABLET (20 MG TOTAL) BY MOUTH DAILY. 30 tablet 1  . JANUVIA 100 MG tablet TAKE 1 TABLET BY MOUTH DAILY 90 tablet 1  . losartan (COZAAR) 50 MG tablet TAKE 1 TABLET (50 MG TOTAL) BY MOUTH DAILY. 90 tablet 1  . metFORMIN (GLUCOPHAGE) 1000 MG tablet TAKE 1 TABLET (1,000 MG TOTAL) BY MOUTH 2 (TWO) TIMES DAILY WITH A MEAL 60 tablet 1  . omega-3 acid ethyl esters (LOVAZA) 1 G capsule Take 1 g by mouth 2 (two) times daily.    . Potassium Chloride ER 20 MEQ  TBCR Take 40 mEq by mouth daily. 60 tablet 1  . pramipexole (MIRAPEX) 0.125 MG tablet Take 3 tablets (0.375 mg total) by mouth at bedtime. 90 tablet 5  . pregabalin (LYRICA) 50 MG capsule Take 50 mg by mouth 2 (two) times daily.    [name] PREMARIN 0.45 MG tablet TAKE 1 TABLET BY MOUTH DAILY 30 tablet 5  . simvastatin (ZOCOR) 40 MG tablet TAKE 1 TABLET BY MOUTH AT BEDTIME 30 tablet 5  . venlafaxine XR (EFFEXOR-XR) 75 MG 24 hr capsule Take 1 capsule (75 mg total) by mouth every evening. 90 capsule 3  . VOLTAREN 1 % GEL Apply 2 g topically 2 (two) times daily as needed (pain).     [name] loperamide (IMODIUM) 2 MG capsule TAKE 1 CAPSULE (2 MG TOTAL) BY MOUTH 2 (TWO) TIMES DAILY AS NEEDED FOR DIARRHEA OR LOOSE STOOLS. 60 capsule 0   No facility-administered medications prior to visit.      Allergies:   Bactrim [sulfamethoxazole-trimethoprim]; Keflex [cephalexin]; Macrobid [nitrofurantoin macrocrystal]; Nitrofurantoin; Sulfa drugs cross reactors; Adhesive [tape]; Codeine; and Talwin [pentazocine]   Social History   Social History  . Marital status: Married    Spouse name: Richardson Landry  . Number of children: 3  . Years of education: 10th grade   Occupational History  .  [employer]   Social History Main Topics  . Smoking status: Never Smoker  . Smokeless tobacco: Never Used  . Alcohol use 0.0 oz/week     Comment: occasional 1 x month  . Drug use: No  . Sexual activity: Yes   Other Topics Concern  . None   Social History Narrative      Marital status:  Married x 20 years; happily married,no abuse. 2nd marriage.      Children: 3 sons (32, 63, 36)---2 sons with substance abuse; 3 grandchildren; 3 step grandchildren; 2 gg.      Lives: with husband, son.      Tobacco:none       Alcohol:   none      Drugs:none      Exercise: exercising at gym.        Seatbelt: 100%      Guns: none      Always uses seat belts. Smoke alarm and carbon monoxide detector in the home.No caffeine use.  No unsecured guns in the home.      Family History:  The patient's family history includes Breast cancer in her maternal aunt; COPD in her father, mother,  paternal grandfather, and paternal grandmother; Cancer in her father and mother; Colon cancer in her cousin; Diabetes in her brother, father, mother, and paternal grandmother; Heart attack in her father and paternal grandfather; Heart disease in her maternal grandfather; Heart disease (age of onset: 24) in her father; Hyperlipidemia in her brother and mother; Hypertension in her brother and mother; Lung disease in her father; Obesity in her brother; Prostate cancer in her father; Stroke in her maternal grandmother.     ROS:   Please see the history of present illness.    ROS All other systems reviewed and are negative.   PHYSICAL EXAM:   VS:  BP 140/90   Pulse 68   Ht 5' (1.524 m)   Wt 174 lb 12.8 oz (79.3 kg)   BMI 34.14 kg/m    GEN: Well nourished, well developed, in no acute distress  HEENT: normal  Neck: no JVD, carotid bruits, or masses Cardiac: RRR; no murmurs, rubs, or gallops,no edema  Respiratory:  clear to auscultation bilaterally, normal work of breathing GI: soft, nontender, nondistended, + BS MS: no deformity or atrophy  Skin: warm and dry, no rash Neuro:  Alert and Oriented x 3, Strength and sensation are intact Psych: euthymic mood, full affect  Wt Readings from Last 3 Encounters:  04/11/16 174 lb 12.8 oz (79.3 kg)  03/20/16 174 lb 6 oz (79.1 kg)  09/27/15 172 lb 9.6 oz (78.3 kg)      Studies/Labs Reviewed:   EKG:  EKG is ordered today.  The ekg ordered today demonstrates NSR HR 68  Recent Labs: 09/27/2015: ALT 31; BUN 20; Creat 0.97; Hemoglobin 14.0; Platelets 199; Potassium 4.0; Sodium 138; TSH 4.002   Lipid Panel    Component Value Date/Time   CHOL 152 09/27/2015 0909   TRIG 122 09/27/2015 0909   HDL 61 09/27/2015 0909   CHOLHDL 2.5 09/27/2015 0909   VLDL 24 09/27/2015 0909   LDLCALC 67 09/27/2015  0909    Additional studies/ records that were reviewed today include:  ETT-Myoview (12/15): Overall Impression: Low risk stress nuclear study. Poor exercise tolerance. Patient reached her target heart rate quickly. No ischemia by perfusion imaging. Normal LV systolic function with no wall motion abnormalities.  LV Ejection Fraction: 73%.  Echocardiogram (12/15):   Study Conclusions - Left ventricle: The cavity size was normal. Wall thickness was normal.  Systolic function was normal. The estimated ejection fraction was in the range of 60% to 65%. Wall motion was normal; there were no regional wall motion abnormalities. Left ventricular diastolic function parameters were normal. - Aortic valve: Structurally normal valve. Trileaflet; normal thickness leaflets. Transvalvular velocity was within the normal range. There was no stenosis. There was no regurgitation. - Aortic root: The aortic root was normal in size. - Ascending aorta: The ascending aorta was normal in size. - Mitral valve: Structurally normal valve. There was trivial regurgitation. - Right ventricle: The cavity size was mildly dilated. Wall thickness was normal. - Right atrium: The atrium was normal in size. - Tricuspid valve: There was no regurgitation. - Pulmonary arteries: Systolic pressure was within the normal range. - Inferior vena cava: The vessel was normal in size. The respirophasic diameter changes were in the normal range (>= 50%), consistent with normal central venous pressure. - Pericardium, extracardiac: There was no pericardial effusion. Impressions: - Normal study.   ASSESSMENT & PLAN:   Chronic diastolic CHF: appears euvolemic. Continue lasix at 20 mg daily. She is sick of taking so many pills  and wants to know if she can come of [place]. Since she is on an ARB which holds potassium and on such a small dose of lasix, it may be fine. She will stop taking it and when she sees her PCP in a week they can  check a CMET and make sure K stable.   HTN: BP with borderline control on Cozaar 50mg  daily. I recheck BP myself and 145/90. She says she has had a stressful week. If it remains elevated at PCP office next week, would recommend increase Cozaar dose   HLD: LDL 67 in 08/2015. Continue statin  DMT2: HgA1c 6.7 in 08/2015. Continue current regimen  Medication Adjustments/Labs and Tests Ordered: Current medicines are reviewed at length with the patient today.  Concerns regarding medicines are outlined above.  Medication changes, Labs and Tests ordered today are listed in the Patient Instructions below. There are no Patient Instructions on file for this visit.   Signed, Angelena Form, PA-C  04/11/2016 9:06 AM    [facility] [address], [place], [place]  [address] Phone: [phone]; Fax: [phone]

## 2016-04-11 ENCOUNTER — Encounter: Payer: Self-pay | Admitting: Physician Assistant

## 2016-04-11 ENCOUNTER — Ambulatory Visit (INDEPENDENT_AMBULATORY_CARE_PROVIDER_SITE_OTHER): Payer: 59 | Admitting: Physician Assistant

## 2016-04-11 VITALS — BP 140/90 | HR 68 | Ht 60.0 in | Wt 174.8 lb

## 2016-04-11 DIAGNOSIS — E785 Hyperlipidemia, unspecified: Secondary | ICD-10-CM | POA: Diagnosis not present

## 2016-04-11 DIAGNOSIS — E118 Type 2 diabetes mellitus with unspecified complications: Secondary | ICD-10-CM

## 2016-04-11 DIAGNOSIS — I10 Essential (primary) hypertension: Secondary | ICD-10-CM

## 2016-04-11 DIAGNOSIS — I493 Ventricular premature depolarization: Secondary | ICD-10-CM | POA: Diagnosis not present

## 2016-04-11 DIAGNOSIS — I5032 Chronic diastolic (congestive) heart failure: Secondary | ICD-10-CM

## 2016-04-11 NOTE — Patient Instructions (Addendum)
Medication Instructions:  Your physician recommends that you continue on your current medications as directed. Please refer to the Current Medication list given to you today.   Labwork: None ordered  Testing/Procedures: None ordered  Follow-Up: Your physician wants you to follow-up in: 1 YEAR WITH DR. COOPER   You will receive a reminder letter in the mail two months in advance. If you don't receive a letter, please call our office to schedule the follow-up appointment.   Any Other Special Instructions Will Be Listed Below (If Applicable).     If you need a refill on your cardiac medications before your next appointment, please call your pharmacy.   

## 2016-04-15 DIAGNOSIS — M5136 Other intervertebral disc degeneration, lumbar region: Secondary | ICD-10-CM | POA: Insufficient documentation

## 2016-04-15 DIAGNOSIS — G629 Polyneuropathy, unspecified: Secondary | ICD-10-CM

## 2016-04-15 DIAGNOSIS — M51369 Other intervertebral disc degeneration, lumbar region without mention of lumbar back pain or lower extremity pain: Secondary | ICD-10-CM

## 2016-04-15 DIAGNOSIS — M19242 Secondary osteoarthritis, left hand: Principal | ICD-10-CM

## 2016-04-15 DIAGNOSIS — M1610 Unilateral primary osteoarthritis, unspecified hip: Secondary | ICD-10-CM

## 2016-04-15 DIAGNOSIS — M19241 Secondary osteoarthritis, right hand: Secondary | ICD-10-CM

## 2016-04-15 DIAGNOSIS — K589 Irritable bowel syndrome without diarrhea: Secondary | ICD-10-CM

## 2016-04-18 ENCOUNTER — Ambulatory Visit (INDEPENDENT_AMBULATORY_CARE_PROVIDER_SITE_OTHER): Payer: 59 | Admitting: Family Medicine

## 2016-04-18 ENCOUNTER — Encounter: Payer: Self-pay | Admitting: Family Medicine

## 2016-04-18 VITALS — BP 120/84 | HR 85 | Temp 97.6°F | Resp 17 | Ht 60.0 in | Wt 169.0 lb

## 2016-04-18 DIAGNOSIS — E1121 Type 2 diabetes mellitus with diabetic nephropathy: Secondary | ICD-10-CM | POA: Diagnosis not present

## 2016-04-18 DIAGNOSIS — I5032 Chronic diastolic (congestive) heart failure: Secondary | ICD-10-CM

## 2016-04-18 DIAGNOSIS — K219 Gastro-esophageal reflux disease without esophagitis: Secondary | ICD-10-CM | POA: Diagnosis not present

## 2016-04-18 DIAGNOSIS — M797 Fibromyalgia: Secondary | ICD-10-CM | POA: Diagnosis not present

## 2016-04-18 DIAGNOSIS — E785 Hyperlipidemia, unspecified: Secondary | ICD-10-CM | POA: Diagnosis not present

## 2016-04-18 DIAGNOSIS — K589 Irritable bowel syndrome without diarrhea: Secondary | ICD-10-CM | POA: Diagnosis not present

## 2016-04-18 DIAGNOSIS — G629 Polyneuropathy, unspecified: Secondary | ICD-10-CM

## 2016-04-18 DIAGNOSIS — F411 Generalized anxiety disorder: Secondary | ICD-10-CM | POA: Diagnosis not present

## 2016-04-18 DIAGNOSIS — Z0000 Encounter for general adult medical examination without abnormal findings: Secondary | ICD-10-CM

## 2016-04-18 LAB — COMPREHENSIVE METABOLIC PANEL
ALK PHOS: 70 U/L (ref 33–130)
ALT: 28 U/L (ref 6–29)
AST: 18 U/L (ref 10–35)
Albumin: 4.5 g/dL (ref 3.6–5.1)
BILIRUBIN TOTAL: 0.6 mg/dL (ref 0.2–1.2)
BUN: 27 mg/dL — AB (ref 7–25)
CALCIUM: 9.7 mg/dL (ref 8.6–10.4)
CO2: 25 mmol/L (ref 20–31)
Chloride: 99 mmol/L (ref 98–110)
Creat: 1.05 mg/dL (ref 0.50–1.05)
GLUCOSE: 139 mg/dL — AB (ref 65–99)
POTASSIUM: 4.4 mmol/L (ref 3.5–5.3)
Sodium: 136 mmol/L (ref 135–146)
TOTAL PROTEIN: 7 g/dL (ref 6.1–8.1)

## 2016-04-18 LAB — CBC WITH DIFFERENTIAL/PLATELET
BASOS PCT: 0 %
Basophils Absolute: 0 cells/uL (ref 0–200)
EOS ABS: 57 {cells}/uL (ref 15–500)
Eosinophils Relative: 1 %
HEMATOCRIT: 45.1 % — AB (ref 35.0–45.0)
Hemoglobin: 15.4 g/dL (ref 11.7–15.5)
LYMPHS ABS: 1710 {cells}/uL (ref 850–3900)
LYMPHS PCT: 30 %
MCH: 31.8 pg (ref 27.0–33.0)
MCHC: 34.1 g/dL (ref 32.0–36.0)
MCV: 93.2 fL (ref 80.0–100.0)
MONO ABS: 399 {cells}/uL (ref 200–950)
MPV: 10.9 fL (ref 7.5–12.5)
Monocytes Relative: 7 %
NEUTROS ABS: 3534 {cells}/uL (ref 1500–7800)
Neutrophils Relative %: 62 %
Platelets: 241 10*3/uL (ref 140–400)
RBC: 4.84 MIL/uL (ref 3.80–5.10)
RDW: 13.4 % (ref 11.0–15.0)
WBC: 5.7 10*3/uL (ref 3.8–10.8)

## 2016-04-18 LAB — POCT URINALYSIS DIP (MANUAL ENTRY)
BILIRUBIN UA: NEGATIVE
BILIRUBIN UA: NEGATIVE
Glucose, UA: NEGATIVE
LEUKOCYTES UA: NEGATIVE
Nitrite, UA: NEGATIVE
PH UA: 5.5
SPEC GRAV UA: 1.02
Urobilinogen, UA: 0.2

## 2016-04-18 LAB — POC MICROSCOPIC URINALYSIS (UMFC): Mucus: ABSENT

## 2016-04-18 LAB — TSH: TSH: 3.58 m[IU]/L

## 2016-04-18 LAB — LIPID PANEL
CHOLESTEROL: 256 mg/dL — AB (ref 125–200)
HDL: 67 mg/dL (ref >=46)
LDL Cholesterol: 137 mg/dL — ABNORMAL HIGH (ref <130)
Total CHOL/HDL Ratio: 3.8 Ratio (ref <=5.0)
Triglycerides: 260 mg/dL — ABNORMAL HIGH (ref <150)
VLDL: 52 mg/dL — ABNORMAL HIGH (ref <30)

## 2016-04-18 LAB — MICROALBUMIN, URINE: MICROALB UR: 5.5 mg/dL

## 2016-04-18 LAB — VITAMIN D 25 HYDROXY (VIT D DEFICIENCY, FRACTURES): VIT D 25 HYDROXY: 40 ng/mL (ref 30–100)

## 2016-04-18 LAB — VITAMIN B12: VITAMIN B 12: 256 pg/mL (ref 200–1100)

## 2016-04-18 MED ORDER — SIMVASTATIN 40 MG PO TABS: 40.0000 mg | ORAL_TABLET | Freq: Every day | ORAL | 3 refills | Status: DC

## 2016-04-18 MED ORDER — FLUTICASONE PROPIONATE 50 MCG/ACT NA SUSP: 2.0000 | Freq: Every day | NASAL | 11 refills | Status: DC

## 2016-04-18 MED ORDER — SITAGLIPTIN PHOSPHATE 100 MG PO TABS: 100.0000 mg | ORAL_TABLET | Freq: Every day | ORAL | 3 refills | Status: DC

## 2016-04-18 MED ORDER — LOSARTAN POTASSIUM 50 MG PO TABS: ORAL_TABLET | ORAL | 3 refills | Status: DC

## 2016-04-18 MED ORDER — VENLAFAXINE HCL ER 75 MG PO CP24: 75.0000 mg | ORAL_CAPSULE | Freq: Every evening | ORAL | 3 refills | Status: DC

## 2016-04-18 NOTE — Progress Notes (Signed)
By signing my name below, I, Dorothy [name], attest that this documentation has been prepared under the direction and in the presence of Treatment Team:  Attending Provider: [name], MD.  Electronically Signed: Verlee [name], Medical Scribe 04/18/2016. [name] 8:27 AM.  Subjective:    Patient ID: Dorothy [name], female    DOB: 1956/04/03, 60 y.o.   MRN: [id number]   04/18/2016  Annual Exam and Numbness (PT states while she is in the sun feels "tingling" in both arms )  HPI  HPI Comments: Dorothy [name] is a 60 y.o. female who presents to the [facility] for her annual complete physical. She is a long standing pt of mine. 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  T2DM: Takes Januvia 100 mg, Metformin 1000 mg BID with meal  HLD: Pt hasn't been compliant with Zocor for a while because she forgot to get a refill, but she plans on starting it again.  Numbness: Pt worsening reports pins and needles in her hands and feet onset months. When pt is exposed to any warm temperature (warm shower, sun her skin all over feels like it's sizzling, tingling, and crawling. Pt can't tan any more because her skin feels like it's crawling.   Cancer Screening: Breast CA: Mammogram was last done 07/2015. Pt doesn't check her breast. FHx: Aunt was dx at 40 y/o with breast CA. Colon CA: Colonoscopy was done 2011, and she just went last week. Pt was put on a powder for occasional diarrhea. Pt can't hold her bowels when she takes her medication, and she was referred to a specialist for her bowel/urine incontinence. Pt has a bm once or twice a day with nl stool, and urines once a night. FHx: 2 cousins died of colon CA. Pt denies melena, n/v abdominal pain, wearing a pad for incontinence. Cervix: Pt has had a hysterectomy for a lot of scar tissue that was painful and occurred due to having too many babies too fast, not for cancerous reason. Pt  still has her ovaries. FHx: Mom died of lung CA and COPD / 55 had testicular CA, COPD, DM, and heart disease / brother who is not a smoker, but does dip has COPD at 59 y/o  Immunizations: Pt is UTD with her vaccines but would like to hold off on her flu vaccine Immunization History  Administered Date(s) Administered  . Hepatitis B 12/14/2011, 06/11/2012, 01/07/2013  . Influenza Split 06/11/2012  . Influenza,inj,Quad PF,36+ Mos 07/28/2013, 07/15/2014, 06/14/2015  . Pneumococcal Polysaccharide-23 06/14/2015  . Pneumococcal-Unspecified 08/29/2007  . Tdap 08/28/2008   Exercise: Pt walks and lifts weights 2 days a week for exercise.  Vision: Pt saw her ophthalmologist 2 months ago. No exam data present  HENT: Pt goes to the HENT once or twice a year. Pt reports chronic tinnitus, and bilateral hearing loss. Pt mentioned she had a sharp pain on the top right side of her head that occurred a couple of weeks ago that has subsided. Pt has allergies but she takes OTC medication for her symptoms. Pt denies mouth sores and cough.  Cardiology: Pt went to the cardiology last week today and had a EKG done there. Pt's bp while there was 140/90 and 145/90, but suspects it's due to white coat syndrome. Pt denies SOB, palpitations, and chest pain.  Neurologist: Sees them every 6 months to a year restless leg syndrome for lyrica. Searing burning in her  left pointer finger. Pt denies sleep disturbance.  Dentist: Pt has a dentist that follows her.  Hep C/HIV Screening: Pt had negative Hep C and HIV antibodies when screened 06/14/15.   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 Pt goes to bed around 10 pm. Pt wears her seatbelt, and doesn't text and drive. Pt has 1-2 drinks  a week, but doesn't smoke. Pt's son who takes methadone is constantly throwing up and it's nothing but bile coming up- he wont see a doctor. Pt's other son who is a heronine addict is doing good.  Review of Systems  Constitutional: Negative for activity change, appetite change, chills, diaphoresis, fatigue, fever and unexpected weight change.  HENT: Negative for congestion, dental problem, drooling, ear discharge, ear pain, facial swelling, hearing loss, mouth sores, nosebleeds, postnasal drip, rhinorrhea, sinus pressure, sneezing, sore throat, tinnitus, trouble swallowing and voice change.   Eyes: Negative for photophobia, pain, discharge, redness, itching and visual disturbance.  Respiratory: Negative for apnea, cough, choking, chest tightness, shortness of breath, wheezing and stridor.   Cardiovascular: Negative for chest pain, palpitations and leg swelling.  Gastrointestinal: Negative for abdominal distention, abdominal pain, anal bleeding, blood in stool, constipation, diarrhea, nausea, rectal pain and vomiting.  Endocrine: Negative for cold intolerance, heat intolerance, polydipsia, polyphagia and polyuria.  Genitourinary: Negative for decreased urine volume, difficulty urinating, dyspareunia, dysuria, enuresis, flank pain, frequency, genital sores, hematuria, menstrual problem, pelvic pain, urgency, vaginal bleeding, vaginal discharge and vaginal pain.  Musculoskeletal: Negative for arthralgias, back pain, gait problem, joint swelling, myalgias, neck pain and neck stiffness.  Skin: Negative for color change, pallor, rash and wound.  Allergic/Immunologic: Negative for environmental allergies, food allergies and immunocompromised state.  Neurological: Positive for numbness. Negative for dizziness, tremors, seizures, syncope, facial asymmetry, speech difficulty, weakness, light-headedness and headaches.  Hematological: Negative for adenopathy. Does not bruise/bleed easily.    Psychiatric/Behavioral: Negative for agitation, behavioral problems, confusion, decreased concentration, dysphoric mood, hallucinations, self-injury, sleep disturbance and suicidal ideas. The patient is not nervous/anxious and is not hyperactive.    13 point ROS negative Past Medical History:  Diagnosis Date  . Allergic rhinitis, cause unspecified   . Anxiety   . B12 deficiency   . Benign paroxysmal positional vertigo   . Choledocholithiasis   . Congestive heart failure ([place])   . Depression   . Diabetes mellitus   . Fatty liver   . Fatty liver 09/04/12  . Fibromyalgia   . Gastritis   . GERD (gastroesophageal reflux disease)   . Hematuria, unspecified   . Hiatal hernia   . Hx of adenomatous colonic polyps   . Hx of cardiovascular stress test    ETT-Myoview (12/15):  No ischemia.  No ECG changes.  EF 73%.  Low Risk.   [name] Hx of echocardiogram    Echo (12/15):  EF 60-65%, no RWMA, normal diast function, mild RVE  . Hyperlipidemia   . Meniere's disease, unspecified   . Migraine   . Mononeuritis of unspecified site   . Osteoarthritis   . Osteoporosis, unspecified   . Otosclerosis, unspecified   . Peptic ulcer, unspecified site, unspecified as acute or chronic, without mention of hemorrhage, perforation, or obstruction   . Symptomatic menopausal or  female climacteric states   . Unspecified disorder of kidney and ureter    Past Surgical History:  Procedure Laterality Date  . BILE DUCT EXPLORATION     gallstone removed  . BREAST ENHANCEMENT SURGERY Bilateral   . BUNIONECTOMY Left 12/2013  . CHOLECYSTECTOMY    . FOOT SURGERY     right  . 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 MOUTH SURGERY    . SHOULDER SURGERY     left  . TOTAL ABDOMINAL HYSTERECTOMY     pelvic pain/scar tissue; ovaries intact.   . TUBAL LIGATION     Allergies  Allergen Reactions  . Bactrim [Sulfamethoxazole-Trimethoprim] Other (See Comments)    blisters  . Keflex [Cephalexin] Hives  . Macrobid [Nitrofurantoin Macrocrystal] Other (See Comments)    blisters  . Nitrofurantoin Nausea And Vomiting and Other (See Comments)    Blisters   . Sulfa Drugs Cross Reactors Itching  . Adhesive [Tape] Other (See Comments)    Tears skins  . Codeine Itching  . Talwin [Pentazocine] Nausea And Vomiting and Rash    Muscle cramps and vision changes    Social History   Social History  . Marital status: Married    Spouse name: Richardson Landry  . Number of children: 3  . Years of education: 10th grade   Occupational History  .  [employer]   Social History Main Topics  . Smoking status: Never Smoker  . Smokeless tobacco: Never Used  . Alcohol use 0.0 oz/week     Comment: occasional 1 x month  . Drug use: No  . Sexual activity: Yes    Birth control/ protection: Post-menopausal, Surgical   Other Topics Concern  . Not on file   Social History Narrative      Marital status:  Married x 21 years; happily married,no abuse. 2nd marriage.      Children: 3 sons (41, 38, 37)---2 sons with substance abuse; 3 grandchildren; 3 step grandchildren; 2 gg.      Lives: with husband, son/Brian.      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      Drugs:none      Exercise: exercising at gym in 2017; 2 days per week; walking and weight lifting.      Seatbelt: 100%; no texting      Guns: none      Always uses seat belts. Smoke alarm and carbon monoxide detector in the home.No caffeine use. No unsecured guns in the home.    Family History  Problem Relation Age of Onset  . Prostate cancer Father   . Lung disease Father   . Diabetes Father   . Heart disease Father 28    CHF, AMI multiple/CABG  . COPD Father   . Heart attack Father    . Cancer Father     testicular cancer  . Diabetes Mother   . Hypertension Mother   . Hyperlipidemia Mother   . COPD Mother   . Cancer Mother     lung cancer  . Diabetes Brother   . Hypertension Brother   . Obesity Brother   . Hyperlipidemia Brother   . COPD Brother   . Stroke Maternal  Grandmother   . Heart disease Maternal Grandfather   . COPD Paternal Grandmother   . Diabetes Paternal Grandmother   . COPD Paternal Grandfather   . Heart attack Paternal Grandfather   . Colon cancer Cousin     died age 41  . Breast cancer Maternal Aunt    Objective:    BP 120/84 (BP Location: Left Arm, Patient Position: Sitting, Cuff Size: Large)   Pulse 85   Temp 97.6 F (36.4 C) (Oral)   Resp 17   Ht 5' (1.524 m)   Wt 169 lb (76.7 kg)   SpO2 98%   BMI 33.01 kg/m  Physical Exam  Constitutional: She is oriented to person, place, and time. She appears well-developed and well-nourished. No distress.  HENT:  Head: Normocephalic and atraumatic.  Right Ear: External ear normal.  Left Ear: External ear normal.  Nose: Nose normal.  Mouth/Throat: Oropharynx is clear and moist.  Eyes: Conjunctivae and EOM are normal. Pupils are equal, round, and reactive to light.  Neck: Normal range of motion and full passive range of motion without pain. Neck supple. No JVD present. Carotid bruit is not present. No thyromegaly present.  Cardiovascular: Normal rate, regular rhythm and normal heart sounds.  Exam reveals no gallop and no friction rub.   No murmur heard. Pulmonary/Chest: Effort normal and breath sounds normal. She has no wheezes. She has no rales. Right breast exhibits no inverted nipple, no mass, no nipple discharge, no skin change and no tenderness. Left breast exhibits no inverted nipple, no mass, no nipple discharge, no skin change and no tenderness. Breasts are symmetrical.  Abdominal: Soft. Bowel sounds are normal. She exhibits no distension and no mass. There is no tenderness. There is no  rebound and no guarding.  Genitourinary: Vagina normal. There is no rash, tenderness or lesion on the right labia. There is no rash, tenderness, lesion or injury on the left labia. Right adnexum displays no mass, no tenderness and no fullness. Left adnexum displays no mass, no tenderness and no fullness.  Musculoskeletal:       Right shoulder: Normal.       Left shoulder: Normal.       Cervical back: Normal.  Lymphadenopathy:    She has no cervical adenopathy.  Neurological: She is alert and oriented to person, place, and time. She has normal reflexes. No cranial nerve deficit. She exhibits normal muscle tone. Coordination normal.  Skin: Skin is warm and dry. No rash noted. She is not diaphoretic. No erythema. No pallor.  Psychiatric: She has a normal mood and affect. Her behavior is normal. Judgment and thought content normal.  Nursing note and vitals reviewed.   Assessment & Plan:   1. Routine physical examination   2. Chronic diastolic heart failure ([place])   3. IBS (irritable bowel syndrome)   4. Gastroesophageal reflux disease without esophagitis   5. Type 2 diabetes mellitus with diabetic nephropathy, without long-term current use of insulin ([place])   6. Neuropathy ([place])   7. Fibromyalgia   8. HLD (hyperlipidemia)   9. Anxiety state    -anticipatory guidance provided. -obtain labs -continue current medications. -Wrist splints for your paresthesias.  Voltaren gel for your hands  Orders Placed This Encounter  Procedures  . CBC with Differential/Platelet  . Comprehensive metabolic panel    Order Specific Question:   Has the patient fasted?    Answer:   Yes  . Lipid panel    Order Specific Question:   Has the  patient fasted?    Answer:   Yes  . TSH  . Hemoglobin A1c  . Vitamin B12  . VITAMIN D 25 Hydroxy (Vit-D Deficiency, Fractures)  . Microalbumin, urine  . POCT urinalysis dipstick  . POCT Microscopic Urinalysis ([facility])   Meds ordered this encounter  Medications  .  venlafaxine XR (EFFEXOR-XR) 75 MG 24 hr capsule    Sig: Take 1 capsule (75 mg total) by mouth every evening.    Dispense:  90 capsule    Refill:  3  . simvastatin (ZOCOR) 40 MG tablet    Sig: Take 1 tablet (40 mg total) by mouth at bedtime.    Dispense:  90 tablet    Refill:  3  . losartan (COZAAR) 50 MG tablet    Sig: TAKE 1 TABLET (50 MG TOTAL) BY MOUTH DAILY.    Dispense:  90 tablet    Refill:  3  . sitaGLIPtin (JANUVIA) 100 MG tablet    Sig: Take 1 tablet (100 mg total) by mouth daily.    Dispense:  90 tablet    Refill:  3  . fluticasone (FLONASE) 50 MCG/ACT nasal spray    Sig: Place 2 sprays into both nostrils daily.    Dispense:  16 g    Refill:  11    Return in about 3 months (around 07/19/2016) for recheck.  I personally performed the services described in this documentation, which was scribed in my presence. The recorded information has been reviewed and considered.  Dayna Geurts Elayne Guerin, M.D. [facility] [address] [place], [place]  [address] [phone] phone [phone] fax

## 2016-04-18 NOTE — Patient Instructions (Addendum)
   IF you received an x-ray today, you will receive an invoice from [facility]. Please contact [facility] at [phone] with questions or concerns regarding your invoice.   IF you received labwork today, you will receive an invoice from [facility]. Please contact [facility] at [phone] with questions or concerns regarding your invoice.   Our billing staff will not be able to assist you with questions regarding bills from these companies.  You will be contacted with the lab results as soon as they are available. The fastest way to get your results is to activate your My Chart account. Instructions are located on the last page of this paperwork. If you have not heard from us regarding the results in 2 weeks, please contact this office.    Keeping You Healthy  Get These Tests  Blood Pressure- Have your blood pressure checked by your healthcare provider at least once a year.  Normal blood pressure is 120/80.  Weight- Have your body mass index (BMI) calculated to screen for obesity.  BMI is a measure of body fat based on height and weight.  You can calculate your own BMI at www.nhlbisupport.com/bmi/  Cholesterol- Have your cholesterol checked every year.  Diabetes- Have your blood sugar checked every year if you have high blood pressure, high cholesterol, a family history of diabetes or if you are overweight.  Pap Test - Have a pap test every 1 to 5 years if you have been sexually active.  If you are older than 65 and recent pap tests have been normal you may not need additional pap tests.  In addition, if you have had a hysterectomy  for benign disease additional pap tests are not necessary.  Mammogram-Yearly mammograms are essential for early detection of breast cancer  Screening for Colon Cancer- Colonoscopy starting at age 50. Screening may begin sooner depending on your family history and other health conditions.  Follow up colonoscopy  as directed by your Gastroenterologist.  Screening for Osteoporosis- Screening begins at age 65 with bone density scanning, sooner if you are at higher risk for developing Osteoporosis.  Get these medicines  Calcium with Vitamin D- Your body requires 1200-1500 mg of Calcium a day and 800-1000 IU of Vitamin D a day.  You can only absorb 500 mg of Calcium at a time therefore Calcium must be taken in 2 or 3 separate doses throughout the day.  Hormones- Hormone therapy has been associated with increased risk for certain cancers and heart disease.  Talk to your healthcare provider about if you need relief from menopausal symptoms.  Aspirin- Ask your healthcare provider about taking Aspirin to prevent Heart Disease and Stroke.  Get these Immuniztions  Flu shot- Every fall  Pneumonia shot- Once after the age of 65; if you are younger ask your healthcare provider if you need a pneumonia shot.  Tetanus- Every ten years.  Zostavax- Once after the age of 60 to prevent shingles.  Take these steps  Don't smoke- Your healthcare provider can help you quit. For tips on how to quit, ask your healthcare provider or go to www.smokefree.gov or call 1-800 QUIT-NOW.  Be physically active- Exercise 5 days a week for a minimum of 30 minutes.  If you are not already physically active, start slow and gradually work up to 30 minutes of moderate physical activity.  Try walking, dancing, bike riding, swimming, etc.  Eat a healthy diet- Eat a variety of healthy foods such as fruits, vegetables, whole   grains, low fat milk, low fat cheeses, yogurt, lean meats, chicken, fish, eggs, dried beans, tofu, etc.  For more information go to www.thenutritionsource.org  Dental visit- Brush and floss teeth twice daily; visit your dentist twice a year.  Eye exam- Visit your Optometrist or Ophthalmologist yearly.  Drink alcohol in moderation- Limit alcohol intake to one drink or less a day.  Never drink and  drive.  Depression- Your emotional health is as important as your physical health.  If you're feeling down or losing interest in things you normally enjoy, please talk to your healthcare provider.  Seat Belts- can save your life; always wear one  Smoke/Carbon Monoxide detectors- These detectors need to be installed on the appropriate level of your home.  Replace batteries at least once a year.  Violence- If anyone is threatening or hurting you, please tell your healthcare provider.  Living Will/ Health care power of attorney- Discuss with your healthcare provider and family. 

## 2016-04-19 ENCOUNTER — Telehealth: Payer: Self-pay | Admitting: Cardiovascular Disease

## 2016-04-19 LAB — HEMOGLOBIN A1C
HEMOGLOBIN A1C: 6.5 % — AB (ref <5.7)
Mean Plasma Glucose: 140 mg/dL

## 2016-04-19 NOTE — Telephone Encounter (Signed)
I spoke with the Dorothy [name] and she said she has been having issues with burning and tingling of her skin.  This has been going on for over a year but has started to worsen in the last 6 months.  The Dorothy [name] saw her PCP yesterday and they advised her to speak with her pharmacist to determine if this is a drug reaction. The pharmacist felt like it could be related to lasix and the Dorothy [name] has been taking this medication since around the time she first noticed the symptoms.  The Dorothy [name] is on low dose Furosemide 20mg  daily and I advised her that the only way to see if this is related to medication would be to stop it for 1-2 weeks.  The Dorothy [name] will monitor for any signs of excess fluid and restart lasix if she develops symptoms. She will weigh daily and monitor her diet.  The Dorothy [name] will touch base with our office after being off lasix to let us know if symptoms improved. Dorothy [name] agreed with plan.

## 2016-04-19 NOTE — Telephone Encounter (Signed)
New message    Patient calling seen APP last week.    Pt c/o medication issue:  1. Name of Medication: lasix   2. How are you currently taking this medication (dosage and times per day)? 20 mg   3. Are you having a reaction (difficulty breathing--STAT)? Burning and tingling even if  she gets in the sun or shower / anyting warm - skin crawling.   4. What is your medication issue? Going on for about a year - gotten worse this month.

## 2016-04-19 NOTE — Telephone Encounter (Signed)
Agree. thanks

## 2016-04-25 ENCOUNTER — Ambulatory Visit: Payer: 59 | Admitting: Physical Therapy

## 2016-05-09 ENCOUNTER — Other Ambulatory Visit: Payer: Self-pay | Admitting: Family Medicine

## 2016-05-09 ENCOUNTER — Other Ambulatory Visit: Payer: Self-pay | Admitting: Cardiovascular Disease

## 2016-05-30 ENCOUNTER — Ambulatory Visit: Payer: 59 | Admitting: Physical Therapy

## 2016-06-07 ENCOUNTER — Encounter: Payer: Self-pay | Admitting: Neurology

## 2016-06-09 ENCOUNTER — Other Ambulatory Visit: Payer: Self-pay | Admitting: Physician Assistant

## 2016-06-14 ENCOUNTER — Ambulatory Visit: Payer: 59 | Attending: Gastroenterology | Admitting: Physical Therapy

## 2016-06-14 ENCOUNTER — Encounter: Payer: Self-pay | Admitting: Physical Therapy

## 2016-06-14 DIAGNOSIS — R279 Unspecified lack of coordination: Secondary | ICD-10-CM | POA: Insufficient documentation

## 2016-06-14 DIAGNOSIS — M62838 Other muscle spasm: Secondary | ICD-10-CM | POA: Diagnosis present

## 2016-06-14 DIAGNOSIS — M6281 Muscle weakness (generalized): Secondary | ICD-10-CM | POA: Diagnosis present

## 2016-06-14 NOTE — Therapy (Signed)
[facility] [address], [place], [place], [address] Phone: [phone]   Fax:  [phone]  Physical Therapy Evaluation  Patient Details  Name: Dorothy [name] MRN: [id number] Date of Birth: 1956/02/08 Referring Provider: Dr. Harl Bowie  Encounter Date: 06/14/2016      PT End of Session - 06/14/16 1115    Visit Number 1   Date for PT Re-Evaluation 10/15/16   PT Start Time [id number]   PT Stop Time 1100   PT Time Calculation (min) 45 min   Activity Tolerance Patient tolerated treatment well   Behavior During Therapy [facility] for tasks assessed/performed      Past Medical History:  Diagnosis Date  . Allergic rhinitis, cause unspecified   . Anxiety   . B12 deficiency   . Benign paroxysmal positional vertigo   . Choledocholithiasis   . Congestive heart failure ([place])   . Depression   . Diabetes mellitus   . Fatty liver   . Fatty liver 09/04/12  . Fibromyalgia   . Gastritis   . GERD (gastroesophageal reflux disease)   . Hematuria, unspecified   . Hiatal hernia   . Hx of adenomatous colonic polyps   . Hx of cardiovascular stress test    ETT-Myoview (12/15):  No ischemia.  No ECG changes.  EF 73%.  Low Risk.   [name] Hx of echocardiogram    Echo (12/15):  EF 60-65%, no RWMA, normal diast function, mild RVE  . Hyperlipidemia   . Meniere's disease, unspecified   . Migraine   . Mononeuritis of unspecified site   . Osteoarthritis   . Osteoporosis, unspecified   . Otosclerosis, unspecified   . Peptic ulcer, unspecified site, unspecified as acute or chronic, without mention of hemorrhage, perforation, or obstruction   . Symptomatic menopausal or female climacteric states   . Unspecified disorder of kidney and ureter     Past Surgical History:  Procedure Laterality Date  . BILE DUCT EXPLORATION     gallstone removed  . BREAST ENHANCEMENT SURGERY Bilateral   . BUNIONECTOMY Left 12/2013  . CHOLECYSTECTOMY    . FOOT  SURGERY     right  . 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 MOUTH SURGERY    . SHOULDER SURGERY     left  . TOTAL ABDOMINAL HYSTERECTOMY     pelvic pain/scar tissue; ovaries intact.  . TUBAL LIGATION      There were no vitals filed for this visit.       Subjective Assessment - 06/14/16 1021    Subjective Patient reports after her gallbladder removed in 2001 she has had fecal incontinence and diarrhea.  After she eats she loses control and does not know she is going to have a bowel movement. Stools go all over her. Patient does not wear pads.    Patient Stated Goals be able to go out to eat and not have an accident   Currently in Pain? No/denies   Multiple Pain Sites No            [facility] PT Assessment - 06/14/16 0001      Assessment   Medical Diagnosis R19.7 Diarrhea, unspecified type; R15.9 fecal incontinence   Referring Provider Dr. Harl Bowie   Onset Date/Surgical Date 03/04/11   Prior Therapy None  Precautions   Precautions Other (comment)   Precaution Comments no joint mobilization     Restrictions   Weight Bearing Restrictions No     Balance Screen   Has the patient fallen in the past 6 months No   Has the patient had a decrease in activity level because of a fear of falling?  No   Is the patient reluctant to leave their home because of a fear of falling?  No     Home Ecologist residence     Prior Function   Level of Independence Independent   Vocation Full time employment   Technical brewer, more standing than sitting     Cognition   Overall Cognitive Status Within Functional Limits for tasks assessed     Observation/Other Assessments   Focus on Therapeutic Outcomes (FOTO)  48% limitation Bowel leakage  40%  limitation goal     Posture/Postural Control   Posture/Postural Control No significant limitations     ROM / Strength   AROM / PROM / Strength AROM;Strength     AROM   Overall AROM Comments lumbar extension and right sidebending decreased by 25%; left hip ER decreased     Strength   Overall Strength Comments abdominal strength is 2/5     Palpation   Palpation comment tenderness in bil. levator ani  right worse than left, bil. piriformis; bilateral lumbar paraspinals                 Pelvic Floor Special Questions - 06/14/16 0001    Prior Pregnancies Yes   Number of Pregnancies 3   Number of Vaginal Deliveries 3   Currently Sexually Active Yes   Is this Painful Yes   Marinoff Scale pain interrupts completion   Urinary Leakage Yes   Activities that cause leaking --  jumping or moving quickly   Fecal incontinence Yes  after eating   Falling out feeling (prolapse) No   Pelvic Floor Internal Exam Patient comfirms identification and approves PT to assess pelvic floor muscles   Exam Type Vaginal;Rectal   Strength Flicker  vaginally 2/5                  PT Education - 06/14/16 1055    Education provided Yes   Education Details abdominal massage; how to massage the pelvic floor muscles   Person(s) Educated Patient   Methods Explanation;Demonstration;Handout   Comprehension Returned demonstration;Verbalized understanding          PT Short Term Goals - 06/14/16 1107      PT SHORT TERM GOAL #1   Title independent with initial HEP   Time 4   Period Weeks   Status New     PT SHORT TERM GOAL #2   Title ability to contract the puborectalis so she is able to start to slow down fecal incontinence   Time 4   Period Weeks   Status New     PT SHORT TERM GOAL #3   Title pain with intercourse decreased >/= 25%   Time 4   Period Weeks   Status New     PT SHORT TERM GOAL #4   Title understand how to perform abdominal massage to assist in bowel movements  and reduce trigger points   Time 4   Period Weeks   Status New     PT SHORT TERM GOAL #5   Title understand how to perfrom self perineal massage to relax the  pelvic floor muscles so they can contract correctly   Time 4   Period Weeks   Status New           PT Long Term Goals - 06/14/16 1111      PT LONG TERM GOAL #1   Title independent with HEP    Time 4   Period Months   Status New     PT LONG TERM GOAL #2   Title ability to contract anal spinter with lift at strength level 4/5 to reduce fecal incontinence >/= 70% after she eats   Time 4   Period Months   Status New     PT LONG TERM GOAL #3   Title penile penetration with intercourse pain decreased >/= 75% due to her ability to relax the tissue   Time 4   Period Months   Status New     PT LONG TERM GOAL #4   Title understand moisterizers and lubricants to reduce vaginal dryness for improved vaginal health   Time 4   Period Months   Status New     PT LONG TERM GOAL #5   Title FOTO score </= 40% limitation   Time 4   Period Months   Status New               Plan - 06/14/16 1058    Clinical Impression Statement Patient is a 60 year old female with diagnosis of fecal incontinence since 2007 when she had her gallbladder removed. Patient reports vaginal dryness.  Patient reports pain with intercourse but did not give a pain level.  Marinoff score is 2/3 due to pain makes her sto pin the middle of intercourse.  She is unable to have an orgasm unless she uses devices.  Patient reports fecal incontinence after she eats and has to shower to clean herself.  Palpable tenderness located in bil. levator ani, obturator internist, posterior furchette with thickening due to episiotomy scar, bil. piriformis.  During rectal contraction the puborectalis and internal sphincter does not move. Rectal strength is 1/5 and vaginal strength is 2/5.  Patient is not able to bulge the pelvic floor due to muscle tightness.  Patient has  tenderness in the abdomen  all over.  Abdominal strength is 1/5. Patient  is moderatelycomplex evaluation due to an evoving condition and comorbidities sucha as s/p gallbladder removal, s/p partial hysterectomy, osteoporosis, fibromyalgia and diabetes that will impact care provided. Patient will benefit from skilled therapy to reduce pain and improve toileting with increased pelvic floor control.    Rehab Potential Excellent   Clinical Impairments Affecting Rehab Potential None   PT Frequency 1x / week   PT Duration Other (comment)  4 months   PT Treatment/Interventions Biofeedback;Electrical Stimulation;Ultrasound;Moist Heat;Therapeutic activities;Therapeutic exercise;Neuromuscular re-education;Patient/family education;Passive range of motion;Manual techniques;Dry needling   PT Next Visit Plan soft tissue work internally, diaphragmatic breathing, abdominal massage, massage to diaphragm, bulging of pelvic floor,    PT Home Exercise Plan progress as needed   Recommended Other Services none   Consulted and Agree with Plan of Care Patient      Patient will benefit from skilled therapeutic intervention in order to improve the following deficits and impairments:  Decreased coordination, Decreased range of motion, Increased fascial restricitons, Increased muscle spasms, Decreased activity tolerance, Pain, Decreased strength, Decreased mobility  Visit Diagnosis: Muscle weakness (generalized) - Plan: PT plan of care cert/re-cert  Other muscle spasm - Plan: PT plan of care cert/re-cert  Unspecified lack of coordination -  Plan: PT plan of care cert/re-cert     Problem List Patient Active Problem List   Diagnosis Date Noted  . Other secondary osteoarthritis of both hands 04/15/2016  . Osteoarthritis of pelvis 04/15/2016  . Degenerative disc disease, lumbar 04/15/2016  . Neuropathy ([place]) 04/15/2016  . IBS (irritable bowel syndrome) 04/15/2016  . Chronic diastolic heart failure ([place]) 06/14/2015   . Chest discomfort 12/10/2013  . Premature ventricular contractions 12/10/2013  . Routine general medical examination at a health care facility 02/25/2013  . Need for hepatitis B vaccination 02/25/2013  . Routine gynecological examination 02/25/2013  . Fibromyalgia 10/03/2012  . Abdominal pain, epigastric 10/01/2012  . DIARRHEA, CHRONIC 10/26/2009  . COLONIC POLYPS, ADENOMATOUS 09/21/2007  . DM2 (diabetes mellitus, type 2) ([place]) 09/21/2007  . HLD (hyperlipidemia) 09/21/2007  . Anxiety state 09/21/2007  . DEPRESSION 09/21/2007  . GERD 09/21/2007  . HIATAL HERNIA 09/21/2007  . CHOLEDOCHOLITHIASIS, HX OF 09/21/2007  . Acute gastritis 07/30/2007    Earlie Counts, PT 06/14/16 11:18 AM  [facility] [address] [place], [place], [address] Phone: [phone]   Fax:  [phone]  Name: Dorothy [name] MRN: [id number] Date of Birth: Jun 07, 1956

## 2016-06-14 NOTE — Patient Instructions (Addendum)
About Abdominal Massage  Abdominal massage, also called external colon massage, is a self-treatment circular massage technique that can reduce and eliminate gas and ease constipation. The colon naturally contracts in waves in a clockwise direction starting from inside the right hip, moving up toward the ribs, across the belly, and down inside the left hip.  When you perform circular abdominal massage, you help stimulate your colon's normal wave pattern of movement called peristalsis.  It is most beneficial when done after eating.  Positioning You can practice abdominal massage with oil while lying down, or in the shower with soap.  Some people find that it is just as effective to do the massage through clothing while sitting or standing.  How to Massage Start by placing your finger tips or knuckles on your right side, just inside your hip bone.  . Make small circular movements while you move upward toward your rib cage.   . Once you reach the bottom right side of your rib cage, take your circular movements across to the left side of the bottom of your rib cage.  . Next, move downward until you reach the inside of your left hip bone.  This is the path your feces travel in your colon. . Continue to perform your abdominal massage in this pattern for 10 minutes each day.     You can apply as much pressure as is comfortable in your massage.  Start gently and build pressure as you continue to practice.  Notice any areas of pain as you massage; areas of slight pain may be relieved as you massage, but if you have areas of significant or intense pain, consult with your healthcare provider.  Other Considerations . General physical activity including bending and stretching can have a beneficial massage-like effect on the colon.  Deep breathing can also stimulate the colon because breathing deeply activates the same nervous system that supplies the colon.   . Abdominal massage should always be used in  combination with a bowel-conscious diet that is high in the proper type of fiber for you, fluids (primarily water), and a regular exercise program.  Use the vaginal massager in the vaginal canal and massage the muscles back and forth in a reclined position for 5 min daily  [facility] [address],  [place], [place] [address] Phone # [phone] Fax [phone]

## 2016-06-15 ENCOUNTER — Other Ambulatory Visit: Payer: Self-pay | Admitting: Neurology

## 2016-06-15 DIAGNOSIS — G2581 Restless legs syndrome: Secondary | ICD-10-CM

## 2016-06-16 NOTE — Progress Notes (Signed)
*IMAGE* Office Visit Note  Patient: Dorothy [name]             Date of Birth: 23-Jul-1956           MRN: [id number]             PCP: Reginia Forts, MD Referring: [name], MD Visit Date: 06/19/2016    Subjective:  Neck Pain of the Spine and Follow-up   History of Present Illness: Dorothy [name] is a 60 y.o. female    Activities of Daily Living:  Patient reports morning stiffness for 30 minute.   Patient Reports nocturnal pain.  Difficulty dressing/grooming: Reports Difficulty climbing stairs: Denies Difficulty getting out of chair: Denies Difficulty using hands for taps, buttons, cutlery, and/or writing: Denies   Review of Systems  Constitutional: Positive for fatigue.  HENT: Negative for mouth sores and mouth dryness.   Eyes: Negative for dryness.  Respiratory: Negative for shortness of breath.   Gastrointestinal: Negative for constipation and diarrhea.  Musculoskeletal: Positive for myalgias and myalgias.  Skin: Negative for sensitivity to sunlight.  Psychiatric/Behavioral: Positive for sleep disturbance. Negative for decreased concentration.    PMFS History:  Patient Active Problem List   Diagnosis Date Noted  . Fibromyalgia 10/03/2012    Priority: High  . Other fatigue 06/19/2016  . Osteoporosis with current pathological fracture 06/19/2016  . Other secondary osteoarthritis of both hands 04/15/2016  . Osteoarthritis of pelvis 04/15/2016  . Degenerative disc disease, lumbar 04/15/2016  . Neuropathy ([place]) 04/15/2016  . IBS (irritable bowel syndrome) 04/15/2016  . Chronic diastolic heart failure ([place]) 06/14/2015  . Chest discomfort 12/10/2013  . Premature ventricular contractions 12/10/2013  . Routine general medical examination at a health care facility 02/25/2013  . Need for hepatitis B vaccination 02/25/2013  . Routine gynecological examination 02/25/2013  . Abdominal pain, epigastric 10/01/2012  . DIARRHEA, CHRONIC 10/26/2009  . COLONIC POLYPS,  ADENOMATOUS 09/21/2007  . DM2 (diabetes mellitus, type 2) ([place]) 09/21/2007  . HLD (hyperlipidemia) 09/21/2007  . Anxiety state 09/21/2007  . DEPRESSION 09/21/2007  . GERD 09/21/2007  . HIATAL HERNIA 09/21/2007  . CHOLEDOCHOLITHIASIS, HX OF 09/21/2007  . Acute gastritis 07/30/2007    Past Medical History:  Diagnosis Date  . Allergic rhinitis, cause unspecified   . Anxiety   . B12 deficiency   . Benign paroxysmal positional vertigo   . Choledocholithiasis   . Congestive heart failure ([place])   . Depression   . Diabetes mellitus   . Fatty liver   . Fatty liver 09/04/12  . Fibromyalgia   . Gastritis   . GERD (gastroesophageal reflux disease)   . Hematuria, unspecified   . Hiatal hernia   . Hx of adenomatous colonic polyps   . Hx of cardiovascular stress test    ETT-Myoview (12/15):  No ischemia.  No ECG changes.  EF 73%.  Low Risk.   [name] Hx of echocardiogram    Echo (12/15):  EF 60-65%, no RWMA, normal diast function, mild RVE  . Hyperlipidemia   . Meniere's disease, unspecified   . Migraine   . Mononeuritis of unspecified site   . Osteoarthritis   . Osteoporosis, unspecified   . Otosclerosis, unspecified   . Peptic ulcer, unspecified site, unspecified as acute or chronic, without mention of hemorrhage, perforation, or obstruction   . Symptomatic menopausal or female climacteric states   . Unspecified disorder of kidney and ureter     Family History  Problem Relation Age of Onset  . Prostate cancer  Father   . Lung disease Father   . Diabetes Father   . Heart disease Father 20    CHF, AMI multiple/CABG  . COPD Father   . Heart attack Father   . Cancer Father     testicular cancer  . Diabetes Mother   . Hypertension Mother   . Hyperlipidemia Mother   . COPD Mother   . Cancer Mother     lung cancer  . Diabetes Brother   . Hypertension Brother   . Obesity Brother   . Hyperlipidemia Brother   . COPD Brother   . Stroke Maternal Grandmother   . Heart disease Maternal  Grandfather   . COPD Paternal Grandmother   . Diabetes Paternal Grandmother   . COPD Paternal Grandfather   . Heart attack Paternal Grandfather   . Colon cancer Cousin     died age 52  . Breast cancer Maternal Aunt    Past Surgical History:  Procedure Laterality Date  . BILE DUCT EXPLORATION     gallstone removed  . BREAST ENHANCEMENT SURGERY Bilateral   . BUNIONECTOMY Left 12/2013  . CHOLECYSTECTOMY    . FOOT SURGERY     right  . 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 MOUTH SURGERY    . SHOULDER SURGERY     left  . TOTAL ABDOMINAL HYSTERECTOMY     pelvic pain/scar tissue; ovaries intact.  . TUBAL LIGATION     Social History   Social History Narrative      Marital status:  Married x 21 years; happily married,no abuse. 2nd marriage.      Children: 3 sons (41, 32, 37)---2 sons with substance abuse; 3 grandchildren; 3 step grandchildren; 2 gg.      Lives: with husband, son/Brian.      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      Drugs:none      Exercise: exercising at gym in 2017; 2 days per week; walking and weight lifting.      Seatbelt: 100%; no texting      Guns: none      Always uses seat belts. Smoke alarm and carbon monoxide detector in the home.No caffeine use. No unsecured guns in the home.      Objective: Vital Signs: BP 134/81   Pulse 72   Resp 14   Ht 5\' 5"  (1.651 m)   Wt 178 lb (80.7 kg)   BMI 29.62 kg/m    Physical Exam  Constitutional: She is oriented to person, place, and time. She appears well-developed and well-nourished.  HENT:  Head: Normocephalic and atraumatic.  Eyes: EOM are normal. Pupils are equal, round, and reactive to light.  Neck: Muscular tenderness present.     Cardiovascular: Normal rate, regular rhythm and normal heart sounds.  Exam reveals no gallop and no friction rub.   No murmur heard. Pulmonary/Chest: Effort normal and breath sounds normal. She has no wheezes. She has no rales.  Abdominal: Soft. Bowel sounds are normal. She exhibits no distension. There is no tenderness. There is no guarding. No hernia.  Musculoskeletal: Normal range of motion. She exhibits no edema, tenderness or deformity.  Lymphadenopathy:    She has no cervical  adenopathy.  Neurological: She is alert and oriented to person, place, and time. Coordination normal.  Skin: Skin is warm and dry. Capillary refill takes less than 2 seconds. No rash noted.  Psychiatric: She has a normal mood and affect. Her behavior is normal.     Musculoskeletal Exam:  Full rom of all joints  CDAI Exam: CDAI Homunculus Exam:   Joint Counts:  CDAI Tender Joint count: 0 CDAI Swollen Joint count: 0  Global Assessments:  Patient Global Assessment: 6 Provider Global Assessment: 6  CDAI Calculated Score: 12    Investigation: No additional findings.   Imaging: No results found.  Speciality Comments: No specialty comments available.    Procedures:  Trigger Point Inj Date/Time: 06/19/2016 2:28 PM Performed by: Eliezer Lofts Authorized by: Eliezer Lofts   Consent Given by:  Patient Indications:  Muscle spasm and pain Total # of Trigger Points:  2 Location: neck   Needle Size:  27 G Medications #1:  10 mg triamcinolone acetonide 40 MG/ML; 0.3 mL lidocaine 1 % Medications #2:  10 mg triamcinolone acetonide 40 MG/ML; 0.3 mL lidocaine 1 % Comments: Patient tolerated procedure well No complication [id number] improved after 1 minute of injection   Allergies: Bactrim [sulfamethoxazole-trimethoprim]; Keflex [cephalexin]; Macrobid [nitrofurantoin macrocrystal]; Nitrofurantoin; Sulfa drugs cross reactors; Adhesive [tape]; Codeine; and Talwin [pentazocine]   Assessment /  Plan: Visit Diagnoses: Fibromyalgia  Osteoporosis with current pathological fracture, unspecified osteoporosis type, sequela  Trapezius muscle spasm  Other fatigue  Insomnia, unspecified type   Fibromyalgia Rates her fms pain as 6 on 0 to 10 pain scale. Doing well w/ Lyrica 50mg  bid (continue)  Is not on robaxin or day time muscle relaxer and will give robaxin to use at 8am and 2pm prn muslce spasms.  Other fatigue Related to fms and poor sleep Rated 6 on scale of 0 to 10.  Osteoporosis with current pathological fracture Due to stress fractures in feet and dexa of -2.7 in aug 2016, we will start pt on weekly fosamax and recheck in sept 2018 w/ repeat dexa at [facility].   Follow-Up Instructions: Return in about 3 months (around 09/19/2016) for FMS, fatigue, insomnia, osteoporosis.  Orders: Orders Placed This Encounter  Procedures  . Trigger Point Injection   No orders of the defined types were placed in this encounter.

## 2016-06-19 ENCOUNTER — Encounter: Payer: Self-pay | Admitting: Rheumatology

## 2016-06-19 ENCOUNTER — Ambulatory Visit (INDEPENDENT_AMBULATORY_CARE_PROVIDER_SITE_OTHER): Payer: 59 | Admitting: Rheumatology

## 2016-06-19 VITALS — BP 134/81 | HR 72 | Resp 14 | Ht 65.0 in | Wt 178.0 lb

## 2016-06-19 DIAGNOSIS — G4700 Insomnia, unspecified: Secondary | ICD-10-CM

## 2016-06-19 DIAGNOSIS — R5383 Other fatigue: Secondary | ICD-10-CM

## 2016-06-19 DIAGNOSIS — M797 Fibromyalgia: Secondary | ICD-10-CM

## 2016-06-19 DIAGNOSIS — M8000XS Age-related osteoporosis with current pathological fracture, unspecified site, sequela: Secondary | ICD-10-CM

## 2016-06-19 DIAGNOSIS — M810 Age-related osteoporosis without current pathological fracture: Secondary | ICD-10-CM | POA: Insufficient documentation

## 2016-06-19 DIAGNOSIS — M62838 Other muscle spasm: Secondary | ICD-10-CM

## 2016-06-19 MED ORDER — METHOCARBAMOL 500 MG PO TABS: ORAL_TABLET | ORAL | 2 refills | Status: DC

## 2016-06-19 MED ORDER — TRIAMCINOLONE ACETONIDE 40 MG/ML IJ SUSP
10.0000 mg | INTRAMUSCULAR | Status: AC | PRN
  Administered 2016-06-19: 10 mg via INTRAMUSCULAR

## 2016-06-19 MED ORDER — LIDOCAINE HCL 1 % IJ SOLN
0.3000 mL | INTRAMUSCULAR | Status: AC | PRN
  Administered 2016-06-19: .3 mL

## 2016-06-19 MED ORDER — ALENDRONATE SODIUM 70 MG PO TABS: 70.0000 mg | ORAL_TABLET | ORAL | 2 refills | Status: DC

## 2016-06-19 NOTE — Addendum Note (Signed)
Addended by: Carole Binning on: 06/19/2016 02:55 PM   Modules accepted: Orders

## 2016-06-19 NOTE — Assessment & Plan Note (Deleted)
Due to stress fractures in

## 2016-06-19 NOTE — Assessment & Plan Note (Signed)
Related to fms and poor sleep Rated 6 on scale of 0 to 10.

## 2016-06-19 NOTE — Assessment & Plan Note (Signed)
Rates her fms pain as 6 on 0 to 10 pain scale. Doing well w/ Lyrica 50mg  bid (continue)  Is not on robaxin or day time muscle relaxer and will give robaxin to use at 8am and 2pm prn muslce spasms.

## 2016-06-19 NOTE — Patient Instructions (Signed)
Alendronate weekly tablets  What is this medicine?  ALENDRONATE (a LEN droe nate) slows calcium loss from bones. It helps to make healthy bone and to slow bone loss in people with osteoporosis. It may be used to treat Paget's disease.  This medicine may be used for other purposes; ask your health care provider or pharmacist if you have questions.  What should I tell my health care provider before I take this medicine?  They need to know if you have any of these conditions:  -esophagus, stomach, or intestine problems, like acid-reflux or GERD  -dental disease  -kidney disease  -low blood calcium  -low vitamin D  -problems swallowing  -problems sitting or standing for 30 minutes  -an unusual or allergic reaction to alendronate, other medicines, foods, dyes, or preservatives  -pregnant or trying to get pregnant  -breast-feeding  How should I use this medicine?  You must take this medicine exactly as directed or you will lower the amount of medicine you absorb into your body or you may cause yourself harm. Take your dose by mouth first thing in the morning, after you are up for the day. Do not eat or drink anything before you take this medicine. Swallow your medicine with a full glass (6 to 8 fluid ounces) of plain water. Do not take this tablet with any other drink. Do not chew or crush the tablet. After taking this medicine, do not eat breakfast, drink, or take any medicines or vitamins for at least 30 minutes. Stand or sit up for at least 30 minutes after you take this medicine; do not lie down. Take this medicine on the same day every week. Do not take your medicine more often than directed.  Talk to your pediatrician regarding the use of this medicine in children. Special care may be needed.  Overdosage: If you think you have taken too much of this medicine contact a poison control center or emergency room at once.  NOTE: This medicine is only for you. Do not share this medicine with others.  What if I miss a  dose?  If you miss a dose, take the dose on the morning after you remember. Then take your next dose on your regular day of the week. Never take 2 tablets on the same day. Do not take double or extra doses.  What may interact with this medicine?  -aluminum hydroxide  -antacids  -aspirin  -calcium supplements  -drugs for inflammation like ibuprofen, naproxen, and others  -iron supplements  -magnesium supplements  -vitamins with minerals  This list may not describe all possible interactions. Give your health care provider a list of all the medicines, herbs, non-prescription drugs, or dietary supplements you use. Also tell them if you smoke, drink alcohol, or use illegal drugs. Some items may interact with your medicine.  What should I watch for while using this medicine?  Visit your doctor or health care professional for regular checks ups. It may be some time before you see benefit from this medicine. Do not stop taking your medication except on your doctor's advice. Your doctor or health care professional may order blood tests and other tests to see how you are doing.  You should make sure you get enough calcium and vitamin D while you are taking this medicine, unless your doctor tells you not to. Discuss the foods you eat and the vitamins you take with your health care professional.  Some people who take this medicine have severe bone, joint,   and/or muscle pain. This medicine may also increase your risk for a broken thigh bone. Tell your doctor right away if you have pain in your upper leg or groin. Tell your doctor if you have any pain that does not go away or that gets worse.  This medicine can make you more sensitive to the sun. If you get a rash while taking this medicine, sunlight may cause the rash to get worse. Keep out of the sun. If you cannot avoid being in the sun, wear protective clothing and use sunscreen. Do not use sun lamps or tanning beds/booths.  What side effects may I notice from receiving this  medicine?  Side effects that you should report to your doctor or health care professional as soon as possible:  -allergic reactions like skin rash, itching or hives, swelling of the face, lips, or tongue  -black or tarry stools  -bone, muscle or joint pain  -changes in vision  -chest pain  -heartburn or stomach pain  -jaw pain, especially after dental work  -pain or trouble when swallowing  -redness, blistering, peeling or loosening of the skin, including inside the mouth  Side effects that usually do not require medical attention (report to your doctor or health care professional if they continue or are bothersome):  -changes in taste  -diarrhea or constipation  -eye pain or itching  -headache  -nausea or vomiting  -stomach gas or fullness  This list may not describe all possible side effects. Call your doctor for medical advice about side effects. You may report side effects to FDA at 1-800-FDA-1088.  Where should I keep my medicine?  Keep out of the reach of children.  Store at room temperature of 15 and 30 degrees C (59 and 86 degrees F). Throw away any unused medicine after the expiration date.  NOTE: This sheet is a summary. It may not cover all possible information. If you have questions about this medicine, talk to your doctor, pharmacist, or health care provider.      2016, Elsevier/Gold Standard. (2011-06-29 09:02:42)

## 2016-06-19 NOTE — Assessment & Plan Note (Signed)
Due to stress fractures in feet and dexa of -2.7 in aug 2016, we will start pt on weekly fosamax and recheck in sept 2018 w/ repeat dexa at [facility].

## 2016-06-20 ENCOUNTER — Telehealth: Payer: Self-pay | Admitting: Radiology

## 2016-06-20 NOTE — Telephone Encounter (Signed)
Called patient to advise labs are normal. They are avail. for review in [place]

## 2016-06-21 ENCOUNTER — Encounter: Payer: Self-pay | Admitting: Physical Therapy

## 2016-06-21 ENCOUNTER — Ambulatory Visit: Payer: 59 | Admitting: Physical Therapy

## 2016-06-21 DIAGNOSIS — M6281 Muscle weakness (generalized): Secondary | ICD-10-CM

## 2016-06-21 DIAGNOSIS — R279 Unspecified lack of coordination: Secondary | ICD-10-CM

## 2016-06-21 DIAGNOSIS — M62838 Other muscle spasm: Secondary | ICD-10-CM

## 2016-06-21 NOTE — Patient Instructions (Addendum)
Hook-Lying    Lie with hips and knees bent. Allow body's muscles to relax. Place hands on belly. Inhale slowly and deeply for _3__ seconds, so hands move up. Then take _3__ seconds to exhale. Repeat _5__ times. Do _3__ times a day. When in pain and feeling stressed.   Copyright  VHI. All rights reserved.   Sitting    Sit comfortably. Allow body's muscles to relax. Place hands on belly. Inhale slowly and deeply for __3_ seconds, so hands move out. Then take _3__ seconds to exhale. Repeat _5__ times. Do _3__ times a day. Do when stressed and feeling pain. Copyright  VHI. All rights reserved.   [facility] [address], [place], [place] [address] Phone # [phone] Fax [phone]

## 2016-06-21 NOTE — Therapy (Signed)
[facility] [address] [place], [place], [address] Phone: [phone]   Fax:  [phone]  Physical Therapy Treatment  Patient Details  Name: Dorothy [name] MRN: [id number] Date of Birth: [birth year] Referring Provider: Dr. Harl Bowie  Encounter Date: 06/21/2016      PT End of Session - 06/21/16 0809    Visit Number 2   Date for PT Re-Evaluation 10/15/16   PT Start Time 0807  came late   PT Stop Time 0845   PT Time Calculation (min) 38 min   Activity Tolerance Patient tolerated treatment well   Behavior During Therapy [facility] for tasks assessed/performed      Past Medical History:  Diagnosis Date  . Allergic rhinitis, cause unspecified   . Anxiety   . B12 deficiency   . Benign paroxysmal positional vertigo   . Choledocholithiasis   . Congestive heart failure ([place])   . Depression   . Diabetes mellitus   . Fatty liver   . Fatty liver 09/04/12  . Fibromyalgia   . Gastritis   . GERD (gastroesophageal reflux disease)   . Hematuria, unspecified   . Hiatal hernia   . Hx of adenomatous colonic polyps   . Hx of cardiovascular stress test    ETT-Myoview (12/15):  No ischemia.  No ECG changes.  EF 73%.  Low Risk.   [name] Hx of echocardiogram    Echo (12/15):  EF 60-65%, no RWMA, normal diast function, mild RVE  . Hyperlipidemia   . Meniere's disease, unspecified   . Migraine   . Mononeuritis of unspecified site   . Osteoarthritis   . Osteoporosis, unspecified   . Otosclerosis, unspecified   . Peptic ulcer, unspecified site, unspecified as acute or chronic, without mention of hemorrhage, perforation, or obstruction   . Symptomatic menopausal or female climacteric states   . Unspecified disorder of kidney and ureter     Past Surgical History:  Procedure Laterality Date  . BILE DUCT EXPLORATION     gallstone removed  . BREAST ENHANCEMENT SURGERY Bilateral   . BUNIONECTOMY Left 12/2013  . CHOLECYSTECTOMY     . FOOT SURGERY     right  . 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 MOUTH SURGERY    . SHOULDER SURGERY     left  . TOTAL ABDOMINAL HYSTERECTOMY     pelvic pain/scar tissue; ovaries intact.  . TUBAL LIGATION      There were no vitals filed for this visit.      Subjective Assessment - 06/21/16 0808    Subjective I was in a little bit of pain after therapy and pain went away 1 hour later.    Patient Stated Goals be able to go out to eat and not have an accident   Currently in Pain? No/denies                         [facility] Adult PT Treatment/Exercise - 06/21/16 0001      Manual Therapy   Manual Therapy Soft tissue mobilization;Myofascial release   Soft tissue mobilization posterior kidnewy mobilization of tissue; tissue release in the upper quadrant goind through the 3 planes of fascia; mobilization of the cecum and the ilio-celca valve;    Myofascial Release release around the umbilicus;  release of the iliacus; mobilization of the bladder                PT Education - 06/21/16 0842    Education provided Yes   Education Details diaphgramatic breathing   Person(s) Educated Patient   Methods Explanation;Demonstration;Handout   Comprehension Returned demonstration;Verbalized understanding          PT Short Term Goals - 06/21/16 0846      PT SHORT TERM GOAL #1   Title independent with initial HEP   Time 4   Period Weeks   Status Achieved     PT SHORT TERM GOAL #2   Title ability to contract the puborectalis so she is able to start to slow down fecal incontinence   Time 4   Period Weeks   Status On-going     PT SHORT TERM GOAL #3   Title pain with intercourse decreased >/= 25%   Time 4   Period Weeks   Status On-going     PT SHORT TERM GOAL #4   Title  understand how to perform abdominal massage to assist in bowel movements and reduce trigger points   Time 4   Period Weeks   Status Achieved           PT Long Term Goals - 06/14/16 1111      PT LONG TERM GOAL #1   Title independent with HEP    Time 4   Period Months   Status New     PT LONG TERM GOAL #2   Title ability to contract anal spinter with lift at strength level 4/5 to reduce fecal incontinence >/= 70% after she eats   Time 4   Period Months   Status New     PT LONG TERM GOAL #3   Title penile penetration with intercourse pain decreased >/= 75% due to her ability to relax the tissue   Time 4   Period Months   Status New     PT LONG TERM GOAL #4   Title understand moisterizers and lubricants to reduce vaginal dryness for improved vaginal health   Time 4   Period Months   Status New     PT LONG TERM GOAL #5   Title FOTO score </= 40% limitation   Time 4   Period Months   Status New               Plan - 06/21/16 [id number]    Clinical Impression Statement Patient reports reduction in abdominal pain after therapy.  She has bought the vaginal massage and will start using it. Patient is able to do diaphrgmatic breathing easier.  Patient has increased abdominal tissue mobility after therapy.  Patient understands how to toilet correctly.  Patient is progressing toward goals.  Patient will benefit from skilled therapy to reduce pain and improve toileting with increased pelvic floor contol.    Rehab Potential Excellent   Clinical Impairments Affecting Rehab Potential None   PT Frequency 1x / week   PT Duration Other (comment)  4 months   PT Treatment/Interventions Biofeedback;Electrical Stimulation;Ultrasound;Moist Heat;Therapeutic activities;Therapeutic exercise;Neuromuscular re-education;Patient/family education;Passive range of motion;Manual techniques;Dry needling   PT Next Visit Plan soft tissue work internally, abdominal massage, massage to diaphragm, bulging  of pelvic floor, release of low back   PT Home Exercise Plan progress as needed   Consulted and Agree with Plan of Care Patient      Patient will benefit from skilled therapeutic intervention in order  to improve the following deficits and impairments:  Decreased coordination, Decreased range of motion, Increased fascial restricitons, Increased muscle spasms, Decreased activity tolerance, Pain, Decreased strength, Decreased mobility  Visit Diagnosis: Muscle weakness (generalized)  Other muscle spasm  Unspecified lack of coordination     Problem List Patient Active Problem List   Diagnosis Date Noted  . Other fatigue 06/19/2016  . Osteoporosis with current pathological fracture 06/19/2016  . Other secondary osteoarthritis of both hands 04/15/2016  . Osteoarthritis of pelvis 04/15/2016  . Degenerative disc disease, lumbar 04/15/2016  . Neuropathy ([place]) 04/15/2016  . IBS (irritable bowel syndrome) 04/15/2016  . Chronic diastolic heart failure ([place]) 06/14/2015  . Chest discomfort 12/10/2013  . Premature ventricular contractions 12/10/2013  . Routine general medical examination at a health care facility 02/25/2013  . Need for hepatitis B vaccination 02/25/2013  . Routine gynecological examination 02/25/2013  . Fibromyalgia 10/03/2012  . Abdominal pain, epigastric 10/01/2012  . DIARRHEA, CHRONIC 10/26/2009  . COLONIC POLYPS, ADENOMATOUS 09/21/2007  . DM2 (diabetes mellitus, type 2) ([place]) 09/21/2007  . HLD (hyperlipidemia) 09/21/2007  . Anxiety state 09/21/2007  . DEPRESSION 09/21/2007  . GERD 09/21/2007  . HIATAL HERNIA 09/21/2007  . CHOLEDOCHOLITHIASIS, HX OF 09/21/2007  . Acute gastritis 07/30/2007    Earlie Counts, PT 06/21/16 8:47 AM   [facility] [address] [place], [place], [address] Phone: [phone]   Fax:  [phone]  Name: Dorothy [name] MRN: [id number] Date of Birth: 04/28/1956

## 2016-07-05 ENCOUNTER — Encounter: Payer: Self-pay | Admitting: Physical Therapy

## 2016-07-05 ENCOUNTER — Other Ambulatory Visit: Payer: Self-pay | Admitting: Family Medicine

## 2016-07-05 ENCOUNTER — Ambulatory Visit: Payer: 59 | Attending: Gastroenterology | Admitting: Physical Therapy

## 2016-07-05 DIAGNOSIS — M62838 Other muscle spasm: Secondary | ICD-10-CM | POA: Diagnosis present

## 2016-07-05 DIAGNOSIS — R279 Unspecified lack of coordination: Secondary | ICD-10-CM | POA: Diagnosis present

## 2016-07-05 DIAGNOSIS — M6281 Muscle weakness (generalized): Secondary | ICD-10-CM

## 2016-07-05 NOTE — Patient Instructions (Addendum)
Diastasis Recti Correction With Towel (Hook-Lying)    Loop long towel or sheet under back and criss-cross over lower abdomen. Inhale. In one fluid movement: Pull in navel, pull towel tight across abdomen, exhale,and tighten abdomen , and Hold for _3__ seconds. Return, rest for _3__ seconds. Repeat _10__ times. Do __2_ times a day.   Copyright  VHI. All rights reserved.  [facility] [address], [place], [place] [address] Phone # [phone] Fax [phone]

## 2016-07-05 NOTE — Telephone Encounter (Signed)
03/2016 last ov 

## 2016-07-05 NOTE — Therapy (Signed)
[facility] [address], [place], [place], [address] Phone: [phone]   Fax:  [phone]  Physical Therapy Treatment  Patient Details  Name: Dorothy [name] MRN: [id number] Date of Birth: 1956/06/14 Referring Provider: Dr. Harl Bowie  Encounter Date: 07/05/2016      PT End of Session - 07/05/16 0959    Visit Number 3   Date for PT Re-Evaluation 10/15/16   PT Start Time 0959   PT Stop Time 1050   PT Time Calculation (min) 51 min   Activity Tolerance Patient tolerated treatment well   Behavior During Therapy [facility] for tasks assessed/performed      Past Medical History:  Diagnosis Date  . Allergic rhinitis, cause unspecified   . Anxiety   . B12 deficiency   . Benign paroxysmal positional vertigo   . Choledocholithiasis   . Congestive heart failure ([place])   . Depression   . Diabetes mellitus   . Fatty liver   . Fatty liver 09/04/12  . Fibromyalgia   . Gastritis   . GERD (gastroesophageal reflux disease)   . Hematuria, unspecified   . Hiatal hernia   . Hx of adenomatous colonic polyps   . Hx of cardiovascular stress test    ETT-Myoview (12/15):  No ischemia.  No ECG changes.  EF 73%.  Low Risk.   [name] Hx of echocardiogram    Echo (12/15):  EF 60-65%, no RWMA, normal diast function, mild RVE  . Hyperlipidemia   . Meniere's disease, unspecified   . Migraine   . Mononeuritis of unspecified site   . Osteoarthritis   . Osteoporosis, unspecified   . Otosclerosis, unspecified   . Peptic ulcer, unspecified site, unspecified as acute or chronic, without mention of hemorrhage, perforation, or obstruction   . Symptomatic menopausal or female climacteric states   . Unspecified disorder of kidney and ureter     Past Surgical History:  Procedure Laterality Date  . BILE DUCT EXPLORATION     gallstone removed  . BREAST ENHANCEMENT SURGERY Bilateral   . BUNIONECTOMY Left 12/2013  . CHOLECYSTECTOMY    . FOOT  SURGERY     right  . 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 MOUTH SURGERY    . SHOULDER SURGERY     left  . TOTAL ABDOMINAL HYSTERECTOMY     pelvic pain/scar tissue; ovaries intact.  . TUBAL LIGATION      There were no vitals filed for this visit.      Subjective Assessment - 07/05/16 0959    Subjective I am able to control bowel movements better with less trouble with diahrrea and able to make it to the bathroom. Patient reports it is 15% better.    Patient Stated Goals be able to go out to eat and not have an accident   Currently in Pain? Yes   Pain Score 3    Pain Location Abdomen   Pain Orientation Mid   Pain Descriptors / Indicators Tender   Pain Type Acute pain   Pain Onset In the past 7 days   Pain Frequency Intermittent   Aggravating Factors  stomach bug   Pain Relieving Factors massage   Multiple Pain Sites No  [facility] Adult PT Treatment/Exercise - 07/05/16 0001      Self-Care   Self-Care Other Self-Care Comments   Other Self-Care Comments  educated patient on how to massage the pelvic floor muscle with a vaginal massager     Lumbar Exercises: Supine   Ab Set 10 reps;3 seconds;Other (comment)   AB Set Limitations using a towel and tactile cues to contract the abdominals     Manual Therapy   Manual Therapy Myofascial release;Soft tissue mobilization   Manual therapy comments tissue rolling on lumbar  and thoracic spine   Soft tissue mobilization release of the lateral fascia of abdomen and lumbar tissue, release of the transverse abdominus; release of the diaphgram; bilateral psoas; release of the large intestine off the bladder   Myofascial Release release of the urogenital diaphgram and bladder from the rectum                PT  Education - 07/05/16 1048    Education provided Yes   Education Details abdominal contraction   Person(s) Educated Patient   Methods Explanation;Demonstration;Verbal cues;Handout   Comprehension Returned demonstration;Verbalized understanding          PT Short Term Goals - 07/05/16 1001      PT SHORT TERM GOAL #1   Title independent with initial HEP   Time 4   Period Weeks   Status Achieved     PT SHORT TERM GOAL #2   Title ability to contract the puborectalis so she is able to start to slow down fecal incontinence   Time 4   Period Weeks   Status On-going     PT SHORT TERM GOAL #3   Title pain with intercourse decreased >/= 25%   Time 4   Period Weeks   Status Achieved     PT SHORT TERM GOAL #4   Title understand how to perform abdominal massage to assist in bowel movements and reduce trigger points   Time 4   Period Weeks   Status Achieved     PT SHORT TERM GOAL #5   Title understand how to perfrom self perineal massage to relax the pelvic floor muscles so they can contract correctly   Time 4   Period Weeks   Status On-going           PT Long Term Goals - 06/14/16 1111      PT LONG TERM GOAL #1   Title independent with HEP    Time 4   Period Months   Status New     PT LONG TERM GOAL #2   Title ability to contract anal spinter with lift at strength level 4/5 to reduce fecal incontinence >/= 70% after she eats   Time 4   Period Months   Status New     PT LONG TERM GOAL #3   Title penile penetration with intercourse pain decreased >/= 75% due to her ability to relax the tissue   Time 4   Period Months   Status New     PT LONG TERM GOAL #4   Title understand moisterizers and lubricants to reduce vaginal dryness for improved vaginal health   Time 4   Period Months   Status New     PT LONG TERM GOAL #5   Title FOTO score </= 40% limitation   Time 4   Period Months   Status New               Plan -  07/05/16 1053    Clinical  Impression Statement Patient reports pain with intercourse is 50% better. Patient has not had fecal incontinence after she eats at a restaurant.  Ptient reports less diahrrea.  Patient has tightness in abdominal tissue and thoracic and lumbar area. Patient has met STG#3.  Patient will benefit from skilled therapy to reduce pain and improve toileting with incresaed pelvic floor control.    Rehab Potential Excellent   Clinical Impairments Affecting Rehab Potential None   PT Frequency 1x / week   PT Duration Other (comment)  4 months   PT Treatment/Interventions Biofeedback;Electrical Stimulation;Ultrasound;Moist Heat;Therapeutic activities;Therapeutic exercise;Neuromuscular re-education;Patient/family education;Passive range of motion;Manual techniques;Dry needling   PT Next Visit Plan soft tissue work internally, abdominal massage, massage to diaphragm, bulging of pelvic floor, release of low back; vaginal moisturizers; abdominal contraction   PT Home Exercise Plan progress as needed   Consulted and Agree with Plan of Care Patient      Patient will benefit from skilled therapeutic intervention in order to improve the following deficits and impairments:  Decreased coordination, Decreased range of motion, Increased fascial restricitons, Increased muscle spasms, Decreased activity tolerance, Pain, Decreased strength, Decreased mobility  Visit Diagnosis: Muscle weakness (generalized)  Other muscle spasm  Unspecified lack of coordination     Problem List Patient Active Problem List   Diagnosis Date Noted  . Other fatigue 06/19/2016  . Osteoporosis with current pathological fracture 06/19/2016  . Other secondary osteoarthritis of both hands 04/15/2016  . Osteoarthritis of pelvis 04/15/2016  . Degenerative disc disease, lumbar 04/15/2016  . Neuropathy ([place]) 04/15/2016  . IBS (irritable bowel syndrome) 04/15/2016  . Chronic diastolic heart failure ([place]) 06/14/2015  . Chest discomfort  12/10/2013  . Premature ventricular contractions 12/10/2013  . Routine general medical examination at a health care facility 02/25/2013  . Need for hepatitis B vaccination 02/25/2013  . Routine gynecological examination 02/25/2013  . Fibromyalgia 10/03/2012  . Abdominal pain, epigastric 10/01/2012  . DIARRHEA, CHRONIC 10/26/2009  . COLONIC POLYPS, ADENOMATOUS 09/21/2007  . DM2 (diabetes mellitus, type 2) ([place]) 09/21/2007  . HLD (hyperlipidemia) 09/21/2007  . Anxiety state 09/21/2007  . DEPRESSION 09/21/2007  . GERD 09/21/2007  . HIATAL HERNIA 09/21/2007  . CHOLEDOCHOLITHIASIS, HX OF 09/21/2007  . Acute gastritis 07/30/2007    Earlie Counts, PT 07/05/16 10:57 AM    [facility] [address] [place], [place], [address] Phone: [phone]   Fax:  [phone]  Name: Dorothy [name] MRN: [id number] Date of Birth: 1956/04/22

## 2016-07-06 ENCOUNTER — Ambulatory Visit: Payer: 59 | Admitting: Neurology

## 2016-07-12 ENCOUNTER — Ambulatory Visit: Payer: 59 | Admitting: Physical Therapy

## 2016-07-12 ENCOUNTER — Encounter: Payer: Self-pay | Admitting: Physical Therapy

## 2016-07-12 DIAGNOSIS — M6281 Muscle weakness (generalized): Secondary | ICD-10-CM

## 2016-07-12 DIAGNOSIS — M62838 Other muscle spasm: Secondary | ICD-10-CM

## 2016-07-12 DIAGNOSIS — R279 Unspecified lack of coordination: Secondary | ICD-10-CM

## 2016-07-12 NOTE — Patient Instructions (Addendum)
Adduction: Hip - Knees Together With Pelvic Floor (Side-Lying)    Lie on left side, hips and knees slightly bent, towel roll between knees. Squeeze pelvic floor while pushing knees together and contracting lower abdominals. Hold for _5__ seconds. Rest for _5__ seconds. Repeat _10__ times. Do _3__ times a day.   Copyright  VHI. All rights reserved.   Isometric Hold (Sitting)    Sit upright. Slowly inhale, and then exhale. Pull navel toward spine and Hold for __5_ seconds, while pulling a towel across the abdomen.  Continue to breathe in and out during hold. Rest for _5__ seconds. Repeat _10__ times. Do _2__ times a day.  Copyright  VHI. All rights reserved.  [facility] [address], [place], [place] [address] Phone # [phone] Fax [phone]

## 2016-07-12 NOTE — Therapy (Signed)
[facility] [address], [place], [place], [address] Phone: [phone]   Fax:  [phone]  Physical Therapy Treatment  Patient Details  Name: Dorothy [name] MRN: [id number] Date of Birth: Jan 20, 1956 Referring Provider: Dr. Harl Bowie  Encounter Date: 07/12/2016      PT End of Session - 07/12/16 1015    Visit Number 4   PT Start Time 0930   PT Stop Time 1015   PT Time Calculation (min) 45 min   Activity Tolerance Patient tolerated treatment well   Behavior During Therapy [facility] for tasks assessed/performed      Past Medical History:  Diagnosis Date  . Allergic rhinitis, cause unspecified   . Anxiety   . B12 deficiency   . Benign paroxysmal positional vertigo   . Choledocholithiasis   . Congestive heart failure ([place])   . Depression   . Diabetes mellitus   . Fatty liver   . Fatty liver 09/04/12  . Fibromyalgia   . Gastritis   . GERD (gastroesophageal reflux disease)   . Hematuria, unspecified   . Hiatal hernia   . Hx of adenomatous colonic polyps   . Hx of cardiovascular stress test    ETT-Myoview (12/15):  No ischemia.  No ECG changes.  EF 73%.  Low Risk.   [name] Hx of echocardiogram    Echo (12/15):  EF 60-65%, no RWMA, normal diast function, mild RVE  . Hyperlipidemia   . Meniere's disease, unspecified   . Migraine   . Mononeuritis of unspecified site   . Osteoarthritis   . Osteoporosis, unspecified   . Otosclerosis, unspecified   . Peptic ulcer, unspecified site, unspecified as acute or chronic, without mention of hemorrhage, perforation, or obstruction   . Symptomatic menopausal or female climacteric states   . Unspecified disorder of kidney and ureter     Past Surgical History:  Procedure Laterality Date  . BILE DUCT EXPLORATION     gallstone removed  . BREAST ENHANCEMENT SURGERY Bilateral   . BUNIONECTOMY Left 12/2013  . CHOLECYSTECTOMY    . FOOT SURGERY     right  . 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 MOUTH SURGERY    . SHOULDER SURGERY     left  . TOTAL ABDOMINAL HYSTERECTOMY     pelvic pain/scar tissue; ovaries intact.  . TUBAL LIGATION      There were no vitals filed for this visit.      Subjective Assessment - 07/12/16 0936    Subjective I am sore from last treatment. My stomach has a different shape.  Have not had intercourse yet. I have been doing the perineal massage and no difficulty. Fecal incontinence is 40% better.    Patient Stated Goals be able to go out to eat and not have an accident   Currently in Pain? Yes   Pain Score 5    Pain Location Buttocks   Pain Orientation Right   Pain Descriptors / Indicators Sore   Pain Radiating Towards goes down to right foot   Pain Onset In the past 7 days   Pain Frequency Intermittent   Aggravating Factors  sitting   Pain Relieving Factors massage   Multiple Pain Sites No  Pelvic Floor Special Questions - 07/12/16 0001    Biofeedback rest 1 uv; 3 quick contractions max 12.71 uv and avg. 4.66 uv; 10 sec 11.53 uv; 20 sec 2.25 uv; rest 1.03 uv; contract 4 sec sidely with ball squeeze   sidely   Biofeedback sensor type Surface  rectal   Biofeedback Activity Quick contraction;10 second hold;Other  assessment                   PT Education - 07/12/16 1011    Education provided Yes   Education Details abdominal contraction in sitting using a towel; pelvic floor contraction in sidely with ball squeeze   Person(s) Educated Patient   Methods Explanation;Demonstration;Handout   Comprehension Returned demonstration;Verbalized understanding          PT Short Term Goals - 07/12/16 1021      PT SHORT TERM GOAL #1   Title independent with initial HEP   Time 4   Period Weeks    Status Achieved     PT SHORT TERM GOAL #2   Title ability to contract the puborectalis so she is able to start to slow down fecal incontinence   Time 4   Period Weeks   Status Achieved     PT SHORT TERM GOAL #3   Title pain with intercourse decreased >/= 25%   Time 4   Period Weeks   Status Achieved     PT SHORT TERM GOAL #4   Title understand how to perform abdominal massage to assist in bowel movements and reduce trigger points   Time 4   Period Weeks   Status Achieved     PT SHORT TERM GOAL #5   Title understand how to perfrom self perineal massage to relax the pelvic floor muscles so they can contract correctly   Time 4   Period Weeks   Status Achieved           PT Long Term Goals - 06/14/16 1111      PT LONG TERM GOAL #1   Title independent with HEP    Time 4   Period Months   Status New     PT LONG TERM GOAL #2   Title ability to contract anal spinter with lift at strength level 4/5 to reduce fecal incontinence >/= 70% after she eats   Time 4   Period Months   Status New     PT LONG TERM GOAL #3   Title penile penetration with intercourse pain decreased >/= 75% due to her ability to relax the tissue   Time 4   Period Months   Status New     PT LONG TERM GOAL #4   Title understand moisterizers and lubricants to reduce vaginal dryness for improved vaginal health   Time 4   Period Months   Status New     PT LONG TERM GOAL #5   Title FOTO score </= 40% limitation   Time 4   Period Months   Status New               Plan - 07/12/16 1016    Clinical Impression Statement Patient has not had intercourse since last visit. Patient reports frequency of fecal leakag improved by 40%. Pelvic floor resting tone is 1 uv and needs to increase tone.  Patient is unable to hold pelvic floor contraction for 3 seconds but able to hold increased contraction with lower abdominal muscles. Patient is able to contract quickly with  peak after several trials.  Patient  needs to use a towel to contract lower abdominals for facilitation.  Patient abdominals are not as bloated and patient feels like they are not as tight. Patient will benfit from skilled therapy to reduce pain and improve toileting with increased pelvic floor control.    Rehab Potential Excellent   Clinical Impairments Affecting Rehab Potential None   PT Frequency 1x / week  4 months   PT Duration Other (comment)   PT Treatment/Interventions Biofeedback;Electrical Stimulation;Ultrasound;Moist Heat;Therapeutic activities;Therapeutic exercise;Neuromuscular re-education;Patient/family education;Passive range of motion;Manual techniques;Dry needling   PT Next Visit Plan soft tissue work internally, exercise at the gym;  massage to diaphragm, bulging of pelvic floor and contract 8 sec., release of low back; vaginal moisturizers; abdominal contraction   PT Home Exercise Plan progress as needed   Consulted and Agree with Plan of Care Patient      Patient will benefit from skilled therapeutic intervention in order to improve the following deficits and impairments:  Decreased coordination, Decreased range of motion, Increased fascial restricitons, Increased muscle spasms, Decreased activity tolerance, Pain, Decreased strength, Decreased mobility  Visit Diagnosis: Muscle weakness (generalized)  Other muscle spasm  Unspecified lack of coordination     Problem List Patient Active Problem List   Diagnosis Date Noted  . Other fatigue 06/19/2016  . Osteoporosis with current pathological fracture 06/19/2016  . Other secondary osteoarthritis of both hands 04/15/2016  . Osteoarthritis of pelvis 04/15/2016  . Degenerative disc disease, lumbar 04/15/2016  . Neuropathy ([place]) 04/15/2016  . IBS (irritable bowel syndrome) 04/15/2016  . Chronic diastolic heart failure ([place]) 06/14/2015  . Chest discomfort 12/10/2013  . Premature ventricular contractions 12/10/2013  . Routine general medical examination at  a health care facility 02/25/2013  . Need for hepatitis B vaccination 02/25/2013  . Routine gynecological examination 02/25/2013  . Fibromyalgia 10/03/2012  . Abdominal pain, epigastric 10/01/2012  . DIARRHEA, CHRONIC 10/26/2009  . COLONIC POLYPS, ADENOMATOUS 09/21/2007  . DM2 (diabetes mellitus, type 2) ([place]) 09/21/2007  . HLD (hyperlipidemia) 09/21/2007  . Anxiety state 09/21/2007  . DEPRESSION 09/21/2007  . GERD 09/21/2007  . HIATAL HERNIA 09/21/2007  . CHOLEDOCHOLITHIASIS, HX OF 09/21/2007  . Acute gastritis 07/30/2007    Earlie Counts, PT 07/12/16 10:22 AM   [facility] [address], [place], [place], [address] Phone: [phone]   Fax:  [phone]  Name: ALYCIANA SEWALL MRN: [id number] Date of Birth: October 21, 1955

## 2016-07-15 ENCOUNTER — Other Ambulatory Visit: Payer: Self-pay | Admitting: Family Medicine

## 2016-07-26 ENCOUNTER — Encounter: Payer: Self-pay | Admitting: Family Medicine

## 2016-07-26 ENCOUNTER — Ambulatory Visit (INDEPENDENT_AMBULATORY_CARE_PROVIDER_SITE_OTHER): Payer: 59 | Admitting: Family Medicine

## 2016-07-26 ENCOUNTER — Encounter: Payer: 59 | Admitting: Physical Therapy

## 2016-07-26 VITALS — BP 128/86 | HR 68 | Temp 98.6°F | Resp 16 | Ht 65.0 in | Wt 170.1 lb

## 2016-07-26 DIAGNOSIS — Z23 Encounter for immunization: Secondary | ICD-10-CM | POA: Diagnosis not present

## 2016-07-26 DIAGNOSIS — E7800 Pure hypercholesterolemia, unspecified: Secondary | ICD-10-CM | POA: Diagnosis not present

## 2016-07-26 DIAGNOSIS — M797 Fibromyalgia: Secondary | ICD-10-CM

## 2016-07-26 DIAGNOSIS — K219 Gastro-esophageal reflux disease without esophagitis: Secondary | ICD-10-CM | POA: Diagnosis not present

## 2016-07-26 DIAGNOSIS — H8101 Meniere's disease, right ear: Secondary | ICD-10-CM

## 2016-07-26 DIAGNOSIS — J069 Acute upper respiratory infection, unspecified: Secondary | ICD-10-CM

## 2016-07-26 DIAGNOSIS — I5032 Chronic diastolic (congestive) heart failure: Secondary | ICD-10-CM | POA: Diagnosis not present

## 2016-07-26 DIAGNOSIS — E1121 Type 2 diabetes mellitus with diabetic nephropathy: Secondary | ICD-10-CM

## 2016-07-26 LAB — CBC WITH DIFFERENTIAL/PLATELET
BASOS ABS: 44 {cells}/uL (ref 0–200)
Basophils Relative: 1 %
EOS PCT: 3 %
Eosinophils Absolute: 132 cells/uL (ref 15–500)
HCT: 42 % (ref 35.0–45.0)
Hemoglobin: 14.3 g/dL (ref 11.7–15.5)
LYMPHS ABS: 1188 {cells}/uL (ref 850–3900)
Lymphocytes Relative: 27 %
MCH: 31.6 pg (ref 27.0–33.0)
MCHC: 34 g/dL (ref 32.0–36.0)
MCV: 92.7 fL (ref 80.0–100.0)
MONOS PCT: 7 %
MPV: 10.4 fL (ref 7.5–12.5)
Monocytes Absolute: 308 cells/uL (ref 200–950)
NEUTROS ABS: 2728 {cells}/uL (ref 1500–7800)
Neutrophils Relative %: 62 %
PLATELETS: 217 10*3/uL (ref 140–400)
RBC: 4.53 MIL/uL (ref 3.80–5.10)
RDW: 13.6 % (ref 11.0–15.0)
WBC: 4.4 10*3/uL (ref 3.8–10.8)

## 2016-07-26 LAB — COMPREHENSIVE METABOLIC PANEL
ALT: 28 U/L (ref 6–29)
AST: 20 U/L (ref 10–35)
Albumin: 4.3 g/dL (ref 3.6–5.1)
Alkaline Phosphatase: 73 U/L (ref 33–130)
BILIRUBIN TOTAL: 0.7 mg/dL (ref 0.2–1.2)
BUN: 24 mg/dL (ref 7–25)
CHLORIDE: 103 mmol/L (ref 98–110)
CO2: 25 mmol/L (ref 20–31)
CREATININE: 0.98 mg/dL (ref 0.50–1.05)
Calcium: 9 mg/dL (ref 8.6–10.4)
Glucose, Bld: 133 mg/dL — ABNORMAL HIGH (ref 65–99)
Potassium: 4.1 mmol/L (ref 3.5–5.3)
SODIUM: 138 mmol/L (ref 135–146)
TOTAL PROTEIN: 6.5 g/dL (ref 6.1–8.1)

## 2016-07-26 LAB — LIPID PANEL
Cholesterol: 234 mg/dL — ABNORMAL HIGH (ref <200)
HDL: 63 mg/dL (ref >50)
LDL CALC: 138 mg/dL — AB (ref <100)
Total CHOL/HDL Ratio: 3.7 Ratio (ref <5.0)
Triglycerides: 166 mg/dL — ABNORMAL HIGH (ref <150)
VLDL: 33 mg/dL — AB (ref <30)

## 2016-07-26 LAB — POCT GLYCOSYLATED HEMOGLOBIN (HGB A1C): Hemoglobin A1C: 6.3

## 2016-07-26 MED ORDER — IPRATROPIUM BROMIDE 0.03 % NA SOLN: 2.0000 | Freq: Two times a day (BID) | NASAL | 0 refills | Status: DC

## 2016-07-26 MED ORDER — HYDROCHLOROTHIAZIDE 25 MG PO TABS: 25.0000 mg | ORAL_TABLET | Freq: Every day | ORAL | 1 refills | Status: DC

## 2016-07-26 NOTE — Patient Instructions (Signed)
     IF you received an x-ray today, you will receive an invoice from [facility]. Please contact [facility] at [phone] with questions or concerns regarding your invoice.   IF you received labwork today, you will receive an invoice from [facility]. Please contact [facility] at [phone] with questions or concerns regarding your invoice.   Our billing staff will not be able to assist you with questions regarding bills from these companies.  You will be contacted with the lab results as soon as they are available. The fastest way to get your results is to activate your My Chart account. Instructions are located on the last page of this paperwork. If you have not heard from us regarding the results in 2 weeks, please contact this office.      

## 2016-07-26 NOTE — Progress Notes (Signed)
Subjective:    Patient ID: Dorothy [name], female    DOB: 1956-07-22, 60 y.o.   MRN: [id number]  07/26/2016  Follow-up (A1c)   HPI This 60 y.o. female presents for three month follow-up for DMII, hypercholesterolemia, anxiety/depresssion, fibromyalgia, alergic rhinitis, IBS diarrhea.  Doing well overall.  Husband forced retirement.    Acute illness; onset three days ago.  No fever/chills/sweats.  +HA horrible; +vertigo; no ear pain; nothing for vertigo. +nasal congestion; +rhinorrhea; no ear pain or sore throat.  +cough; no SOB; no wheezing.  HCTZ in past; skin was crawling and itching horribly.  Husband researched symptoms and determined it was the Lasix. Stopped Lasix for two weeks and crawling skin resolved. Has been taking dandolion root without issues with swelling. Didn't feel like driving.  No cough; no SOB.    Gastroenterologist referred to physical therapy; off of [place]; loves her; doing well Biochemist, clinical.  Going really well. Had to get a vibrator; had so much scar tissue.  Episiotomies x 3.  Massaging scar tissue in abdomen. Cheryl.   Immunization History  Administered Date(s) Administered  . Hepatitis B 12/14/2011, 06/11/2012, 01/07/2013  . Influenza Split 06/11/2012  . Influenza,inj,Quad PF,36+ Mos 07/28/2013, 07/15/2014, 06/14/2015, 07/26/2016  . Pneumococcal Polysaccharide-23 06/14/2015  . Pneumococcal-Unspecified 08/29/2007  . Tdap 08/28/2008    Review of Systems  Constitutional: Negative for chills, diaphoresis, fatigue and fever.  HENT: Positive for congestion, postnasal drip, rhinorrhea and voice change. Negative for ear pain, sinus pain, sinus pressure and sore throat.   Eyes: Negative for visual disturbance.  Respiratory: Positive for cough. Negative for shortness of breath.   Cardiovascular: Negative for chest pain, palpitations and leg swelling.  Gastrointestinal: Negative for abdominal pain, constipation, diarrhea, nausea and vomiting.    Endocrine: Negative for cold intolerance, heat intolerance, polydipsia, polyphagia and polyuria.  Neurological: Positive for dizziness and headaches. Negative for tremors, seizures, syncope, facial asymmetry, speech difficulty, weakness, light-headedness and numbness.    Past Medical History:  Diagnosis Date  . Allergic rhinitis, cause unspecified   . Anxiety   . B12 deficiency   . Benign paroxysmal positional vertigo   . Choledocholithiasis   . Congestive heart failure ([place])   . Depression   . Diabetes mellitus   . Fatty liver   . Fatty liver 09/04/12  . Fibromyalgia   . Gastritis   . GERD (gastroesophageal reflux disease)   . Hematuria, unspecified   . Hiatal hernia   . Hx of adenomatous colonic polyps   . Hx of cardiovascular stress test    ETT-Myoview (12/15):  No ischemia.  No ECG changes.  EF 73%.  Low Risk.   [name] Hx of echocardiogram    Echo (12/15):  EF 60-65%, no RWMA, normal diast function, mild RVE  . Hyperlipidemia   . Meniere's disease, unspecified   . Migraine   . Mononeuritis of unspecified site   . Osteoarthritis   . Osteoporosis, unspecified   . Otosclerosis, unspecified   . Peptic ulcer, unspecified site, unspecified as acute or chronic, without mention of hemorrhage, perforation, or obstruction   . Symptomatic menopausal or female climacteric states   . Unspecified disorder of kidney and ureter    Past Surgical History:  Procedure Laterality Date  . BILE DUCT EXPLORATION     gallstone removed  . BREAST ENHANCEMENT SURGERY Bilateral   . BUNIONECTOMY Left 12/2013  . CHOLECYSTECTOMY    . FOOT SURGERY     right  . 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 MOUTH SURGERY    . SHOULDER SURGERY     left  . TOTAL ABDOMINAL HYSTERECTOMY     pelvic pain/scar tissue; ovaries intact.   . TUBAL LIGATION     Allergies  Allergen Reactions  . Bactrim [Sulfamethoxazole-Trimethoprim] Other (See Comments)    blisters  . Keflex [Cephalexin] Hives  . Macrobid [Nitrofurantoin Macrocrystal] Other (See Comments)    blisters  . Nitrofurantoin Nausea And Vomiting and Other (See Comments)    Blisters   . Sulfa Drugs Cross Reactors Itching  . Adhesive [Tape] Other (See Comments)    Tears skins  . Codeine Itching  . Talwin [Pentazocine] Nausea And Vomiting and Rash    Muscle cramps and vision changes   Current Outpatient Prescriptions  Medication Sig Dispense Refill  . alendronate (FOSAMAX) 70 MG tablet Take 1 tablet (70 mg total) by mouth once a week. Take with a full glass of water on an empty stomach. 4 tablet 2  . aspirin 81 MG tablet Take 81 mg by mouth every evening.    . Calcium Carb-Cholecalciferol (CALCIUM 500 +D) 500-400 MG-UNIT TABS Take 1 tablet by mouth daily.     . Cholecalciferol (VITAMIN D3) 2000 UNITS TABS Take 2,000 Units by mouth daily.     . cholestyramine light 4 g POWD Take 1 packet (4 g total) by mouth 2 (two) times daily. 90 packet 3  . DEXILANT 60 MG capsule TAKE ONE CAPSULE BY MOUTH DAILY 30 capsule 11  . fluticasone (FLONASE) 50 MCG/ACT nasal spray Place 2 sprays into both nostrils daily. 16 g 11  . furosemide (LASIX) 20 MG tablet Take 1 tablet (20 mg total) by mouth daily. 30 tablet 11  . losartan (COZAAR) 50 MG tablet TAKE 1 TABLET (50 MG TOTAL) BY MOUTH DAILY. 90 tablet 3  . metFORMIN (GLUCOPHAGE) 1000 MG tablet TAKE 1 TABLET (1,000 MG TOTAL) BY MOUTH 2 (TWO) TIMES DAILY WITH A MEAL 60 tablet 0  . omega-3 acid ethyl esters (LOVAZA) 1 G capsule Take 1 g by mouth 2 (two) times daily.    . Potassium Chloride ER 20 MEQ TBCR Take 2 tablets (40 meQ) by mouth daily 60 tablet 11  . pramipexole (MIRAPEX) 0.125 MG tablet TAKE 3 TABLETS (0.375 MG TOTAL) BY MOUTH AT BEDTIME. 90 tablet 4  . pregabalin (LYRICA) 50 MG capsule Take 50 mg by mouth 2 (two) times  daily.    . simvastatin (ZOCOR) 40 MG tablet Take 1 tablet (40 mg total) by mouth at bedtime. 90 tablet 3  . sitaGLIPtin (JANUVIA) 100 MG tablet Take 1 tablet (100 mg total) by mouth daily. 90 tablet 3  . venlafaxine XR (EFFEXOR-XR) 75 MG 24 hr capsule Take 1 capsule (75 mg total) by mouth every evening. 90 capsule 3  . VOLTAREN 1 % GEL Apply 2 g topically 2 (two) times daily as needed (pain).     [name] azithromycin (ZITHROMAX) 250 MG tablet Two tablets daily x 1 day then one tablet daily x 4 days 6 tablet 0  . hydrochlorothiazide (HYDRODIURIL) 25 MG tablet Take 1 tablet (25 mg total) by mouth daily. 90 tablet 1  . ipratropium (ATROVENT) 0.03 % nasal spray Place 2 sprays into the nose 2 (two) times daily. 30 mL 0  . loperamide (IMODIUM) 2 MG capsule Take 2 mg by  mouth as needed for diarrhea or loose stools.    . metaxalone (SKELAXIN) 800 MG tablet Take 800 mg by mouth 3 (three) times daily.    . methocarbamol (ROBAXIN) 500 MG tablet Use 8am and 2pm prn muscle spasms (Patient not taking: Reported on 07/26/2016) 60 tablet 2  . PREMARIN 0.45 MG tablet TAKE 1 TABLET BY MOUTH DAILY (Patient not taking: Reported on 07/26/2016) 30 tablet 5  . sucralfate (CARAFATE) 1 g tablet Take 1 g by mouth 4 (four) times daily -  with meals and at bedtime.     No current facility-administered medications for this visit.    Social History   Social History  . Marital status: Married    Spouse name: Richardson Landry  . Number of children: 3  . Years of education: 10th grade   Occupational History  .  [employer]   Social History Main Topics  . Smoking status: Never Smoker  . Smokeless tobacco: Never Used  . Alcohol use 0.0 oz/week     Comment: occasional 1 x month  . Drug use: No  . Sexual activity: Yes    Birth control/ protection: Post-menopausal, Surgical   Other Topics Concern  . Not on file   Social History Narrative      Marital status:  Married x 21 years; happily married,no abuse. 2nd  marriage.      Children: 3 sons (41, 39, 37)---2 sons with substance abuse; 3 grandchildren; 3 step grandchildren; 2 gg.      Lives: with husband, son/Brian.      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      Drugs:none      Exercise: exercising at gym in 2017; 2 days per week; walking and weight lifting.      Seatbelt: 100%; no texting      Guns: none      Always uses seat belts. Smoke alarm and carbon monoxide detector in the home.No caffeine use. No unsecured guns in the home.    Family History  Problem Relation Age of Onset  . Prostate cancer Father   . Lung disease Father   . Diabetes Father   . Heart disease Father 41    CHF, AMI multiple/CABG  . COPD Father   . Heart attack Father   . Cancer Father     testicular cancer  . Diabetes Mother   . Hypertension Mother   . Hyperlipidemia Mother   . COPD Mother   . Cancer Mother     lung cancer  . Diabetes Brother   . Hypertension Brother   . Obesity Brother   . Hyperlipidemia Brother   . COPD Brother   . Stroke Maternal Grandmother   . Heart disease Maternal Grandfather   . COPD Paternal Grandmother   . Diabetes Paternal Grandmother   . COPD Paternal Grandfather   . Heart attack Paternal Grandfather   . Colon cancer Cousin     died age 67  . Breast cancer Maternal Aunt        Objective:    BP 128/86 (BP Location: Right Arm, Patient Position: Sitting, Cuff Size: Small)   Pulse 68   Temp 98.6 F (37 C) (Oral)   Resp 16   Ht 5\' 5"  (1.651 m)   Wt 170 lb 1.6 oz (77.2 kg)   SpO2 96%   BMI  28.31 kg/m  Physical Exam  Constitutional: She is oriented to person, place, and time. She appears well-developed and well-nourished. No distress.  HENT:  Head: Normocephalic and atraumatic.  Right Ear: Hearing, tympanic membrane, external ear and ear canal normal.  Left Ear: Hearing, tympanic membrane, external ear and ear canal normal.    Nose: Nose normal.  Mouth/Throat: Oropharynx is clear and moist.  Eyes: Conjunctivae and EOM are normal. Pupils are equal, round, and reactive to light.  Neck: Normal range of motion. Neck supple. Carotid bruit is not present. No thyromegaly present.  Cardiovascular: Normal rate, regular rhythm, normal heart sounds and intact distal pulses.  Exam reveals no gallop and no friction rub.   No murmur heard. Pulmonary/Chest: Effort normal and breath sounds normal. She has no wheezes. She has no rales.  Abdominal: Soft. Bowel sounds are normal. She exhibits no distension and no mass. There is no tenderness. There is no rebound and no guarding.  Lymphadenopathy:    She has no cervical adenopathy.  Neurological: She is alert and oriented to person, place, and time. No cranial nerve deficit. She exhibits normal muscle tone. Coordination normal.  Skin: Skin is warm and dry. No rash noted. She is not diaphoretic. No erythema. No pallor.  Psychiatric: Her behavior is normal.        Assessment & Plan:   1. Type 2 diabetes mellitus with diabetic nephropathy, without long-term current use of insulin ([place])   2. Acute upper respiratory infection   3. Chronic diastolic heart failure ([place])   4. Gastroesophageal reflux disease without esophagitis   5. Fibromyalgia   6. Pure hypercholesterolemia   7. Need for prophylactic vaccination and inoculation against influenza   8. Meniere's disease of right ear    -acute illness; rx for Atrovent nasal spray provided; supportive care with Mucinex DM; continue Flonase; call in 4-5 days if no improvement; consistent with viral syndrome.   -acute illness triggering Meniere's disease.  Rx for HCTZ provided yet would only take PRN. -obtain labs.  -continue current medications.   Orders Placed This Encounter  Procedures  . Flu Vaccine QUAD 36+ mos PF IM (Fluarix & Fluzone Quad PF)  . CBC with Differential/Platelet  . Comprehensive metabolic panel    Order  Specific Question:   Has the patient fasted?    Answer:   Yes  . Lipid panel    Order Specific Question:   Has the patient fasted?    Answer:   Yes  . POCT glycosylated hemoglobin (Hb A1C)   Meds ordered this encounter  Medications  . ipratropium (ATROVENT) 0.03 % nasal spray    Sig: Place 2 sprays into the nose 2 (two) times daily.    Dispense:  30 mL    Refill:  0  . hydrochlorothiazide (HYDRODIURIL) 25 MG tablet    Sig: Take 1 tablet (25 mg total) by mouth daily.    Dispense:  90 tablet    Refill:  1    Return in about 3 months (around 10/25/2016) for recheck.   Elice Crigger Elayne Guerin, M.D. [facility] [address] [place], [place]  [address] [phone] phone [phone] fax

## 2016-07-27 ENCOUNTER — Ambulatory Visit: Payer: 59

## 2016-07-28 ENCOUNTER — Telehealth: Payer: Self-pay

## 2016-07-28 NOTE — Telephone Encounter (Signed)
Patient was recently seen and isn't feeling any better. Patient states that she was advised to call back if she still has a sinus infection and she'll be prescribed a z pak. Please advise!  [place] [address]

## 2016-07-29 NOTE — Telephone Encounter (Signed)
I have advised patient rest, fluids, plain mucinex, nasal spray. She understands. Told her message sent to Dr Tamala Julian to advise on ABX. She states to call her back about the Zpack request, her computer is not working, not to send my chart message

## 2016-07-29 NOTE — Telephone Encounter (Signed)
I have called patient, left message for her to call back, will discuss rest, fluids, plain mucinex. Will forward message to Dr Tamala Julian, I see they did discuss Acute illness; onset three days ago.  No fever/chills/sweats.  +HA horrible; +vertigo; no ear pain; nothing for vertigo.    However, I do not see anything regarding call back for Zpack

## 2016-07-30 MED ORDER — AZITHROMYCIN 250 MG PO TABS: ORAL_TABLET | ORAL | 0 refills | Status: DC

## 2016-07-30 NOTE — Telephone Encounter (Signed)
Call --- Zpack sent to [name] for patient.

## 2016-08-02 ENCOUNTER — Encounter: Payer: Self-pay | Admitting: Physical Therapy

## 2016-08-02 ENCOUNTER — Ambulatory Visit: Payer: 59 | Attending: Gastroenterology | Admitting: Physical Therapy

## 2016-08-02 DIAGNOSIS — M6281 Muscle weakness (generalized): Secondary | ICD-10-CM | POA: Insufficient documentation

## 2016-08-02 DIAGNOSIS — R279 Unspecified lack of coordination: Secondary | ICD-10-CM | POA: Diagnosis present

## 2016-08-02 DIAGNOSIS — M62838 Other muscle spasm: Secondary | ICD-10-CM | POA: Insufficient documentation

## 2016-08-02 NOTE — Telephone Encounter (Signed)
LMOM for pt to make sure she was aware that z pak was sent in for her.

## 2016-08-02 NOTE — Therapy (Signed)
[facility], [facility] [address] [place], [place], [address] Phone: [phone]   Fax:  [phone]  Physical Therapy Treatment  Patient Details  Name: Dorothy [name] MRN: [id number] Date of Birth: 07/08/1956 Referring Provider: Dr. Harl Bowie  Encounter Date: 08/02/2016      PT End of Session - 08/02/16 1020    Visit Number 5   Date for PT Re-Evaluation 10/15/16   PT Start Time 0930   PT Stop Time 1010   PT Time Calculation (min) 40 min   Activity Tolerance Patient tolerated treatment well   Behavior During Therapy [facility] for tasks assessed/performed      Past Medical History:  Diagnosis Date  . Allergic rhinitis, cause unspecified   . Anxiety   . B12 deficiency   . Benign paroxysmal positional vertigo   . Choledocholithiasis   . Congestive heart failure ([place])   . Depression   . Diabetes mellitus   . Fatty liver   . Fatty liver 09/04/12  . Fibromyalgia   . Gastritis   . GERD (gastroesophageal reflux disease)   . Hematuria, unspecified   . Hiatal hernia   . Hx of adenomatous colonic polyps   . Hx of cardiovascular stress test    ETT-Myoview (12/15):  No ischemia.  No ECG changes.  EF 73%.  Low Risk.   [name] Hx of echocardiogram    Echo (12/15):  EF 60-65%, no RWMA, normal diast function, mild RVE  . Hyperlipidemia   . Meniere's disease, unspecified   . Migraine   . Mononeuritis of unspecified site   . Osteoarthritis   . Osteoporosis, unspecified   . Otosclerosis, unspecified   . Peptic ulcer, unspecified site, unspecified as acute or chronic, without mention of hemorrhage, perforation, or obstruction   . Symptomatic menopausal or female climacteric states   . Unspecified disorder of kidney and ureter     Past Surgical History:  Procedure Laterality Date  . BILE DUCT EXPLORATION     gallstone removed  . BREAST ENHANCEMENT SURGERY Bilateral   . BUNIONECTOMY Left 12/2013  . CHOLECYSTECTOMY    . FOOT  SURGERY     right  . 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 MOUTH SURGERY    . SHOULDER SURGERY     left  . TOTAL ABDOMINAL HYSTERECTOMY     pelvic pain/scar tissue; ovaries intact.  . TUBAL LIGATION      There were no vitals filed for this visit.      Subjective Assessment - 08/02/16 0935    Subjective I have been sick for 1.5 weeks. During intercourse pain decreased by 60%. Fecal incontinence is 50% better.    Patient Stated Goals be able to go out to eat and not have an accident   Currently in Pain? Yes   Pain Score 5    Pain Location Back   Pain Orientation Right   Pain Descriptors / Indicators Sore   Pain Type Acute pain   Pain Radiating Towards down to the right thigh   Pain Onset In the past 7 days   Pain Frequency Intermittent   Aggravating Factors  sitting   Pain Relieving Factors massage   Multiple Pain Sites No  [place] Adult PT Treatment/Exercise - 08/02/16 0001      Manual Therapy   Manual Therapy Soft tissue mobilization;Myofascial release   Soft tissue mobilization circular abdominal massage throughout   Myofascial Release tissue rolling of the lumbar and thoracic region; release of the left upper quadrant where the stomach is; release of the linea alba below the xiphoid process; release of the scar tissue from her gallbladder surgery.                  PT Education - 08/02/16 1020    Education provided Yes   Education Details increase contraction time of pelvic floor to 8 seconds   Person(s) Educated Patient   Methods Explanation   Comprehension Verbalized understanding          PT Short Term Goals - 07/12/16 1021      PT SHORT TERM GOAL #1   Title independent with initial HEP   Time 4   Period Weeks   Status Achieved      PT SHORT TERM GOAL #2   Title ability to contract the puborectalis so she is able to start to slow down fecal incontinence   Time 4   Period Weeks   Status Achieved     PT SHORT TERM GOAL #3   Title pain with intercourse decreased >/= 25%   Time 4   Period Weeks   Status Achieved     PT SHORT TERM GOAL #4   Title understand how to perform abdominal massage to assist in bowel movements and reduce trigger points   Time 4   Period Weeks   Status Achieved     PT SHORT TERM GOAL #5   Title understand how to perfrom self perineal massage to relax the pelvic floor muscles so they can contract correctly   Time 4   Period Weeks   Status Achieved           PT Long Term Goals - 08/02/16 [id number]      PT LONG TERM GOAL #1   Title independent with HEP    Time 4   Period Months   Status On-going     PT LONG TERM GOAL #2   Title ability to contract anal spinter with lift at strength level 4/5 to reduce fecal incontinence >/= 70% after she eats   Time 4   Period Months   Status On-going  50% better     PT LONG TERM GOAL #3   Title penile penetration with intercourse pain decreased >/= 75% due to her ability to relax the tissue   Time 4   Period Months   Status On-going  60% better     PT LONG TERM GOAL #4   Title understand moisterizers and lubricants to reduce vaginal dryness for improved vaginal health   Time 4   Period Months   Status Achieved     PT LONG TERM GOAL #5   Title FOTO score </= 40% limitation   Time 4   Period Months   Status New               Plan - 08/02/16 1021    Clinical Impression Statement Patient reports her abdominal pain is 70% reduced.  Her fecal incontinence is 50% better. Pain with intercourse is 60% better.  Patient abdominal area is not as firm. Patient was not up to exercising today due to her being sick for 1.5 weeks. Patient will benefit form skilled therapy to reduce  pain and improve toileting with increase pelvic floor  control.    Rehab Potential Excellent   Clinical Impairments Affecting Rehab Potential None   PT Frequency 1x / week   PT Duration Other (comment)  4 months   PT Treatment/Interventions Biofeedback;Electrical Stimulation;Ultrasound;Moist Heat;Therapeutic activities;Therapeutic exercise;Neuromuscular re-education;Patient/family education;Passive range of motion;Manual techniques;Dry needling   PT Next Visit Plan soft tissue work internally, exercise at the gym;  massage to diaphragm, bulging of pelvic floor ., release of low back; abdominal contraction   PT Home Exercise Plan progress as needed   Consulted and Agree with Plan of Care Patient      Patient will benefit from skilled therapeutic intervention in order to improve the following deficits and impairments:  Decreased coordination, Decreased range of motion, Increased fascial restricitons, Increased muscle spasms, Decreased activity tolerance, Pain, Decreased strength, Decreased mobility  Visit Diagnosis: Muscle weakness (generalized)  Other muscle spasm  Unspecified lack of coordination     Problem List Patient Active Problem List   Diagnosis Date Noted  . Osteoporosis with current pathological fracture 06/19/2016  . Other secondary osteoarthritis of both hands 04/15/2016  . Osteoarthritis of pelvis 04/15/2016  . Degenerative disc disease, lumbar 04/15/2016  . Neuropathy ([place]) 04/15/2016  . IBS (irritable bowel syndrome) 04/15/2016  . Chronic diastolic heart failure ([place]) 06/14/2015  . Premature ventricular contractions 12/10/2013  . Fibromyalgia 10/03/2012  . Abdominal pain, epigastric 10/01/2012  . DIARRHEA, CHRONIC 10/26/2009  . COLONIC POLYPS, ADENOMATOUS 09/21/2007  . DM2 (diabetes mellitus, type 2) ([place]) 09/21/2007  . HLD (hyperlipidemia) 09/21/2007  . Anxiety state 09/21/2007  . DEPRESSION 09/21/2007  . GERD 09/21/2007  . HIATAL HERNIA 09/21/2007  . CHOLEDOCHOLITHIASIS, HX OF 09/21/2007  . Acute gastritis  07/30/2007    Earlie Counts, PT 08/02/16 10:24 AM   [facility] [address], [place], [place], [address] Phone: [phone]   Fax:  [phone]  Name: Dorothy [name] MRN: [id number] Date of Birth: 10/23/1955

## 2016-08-10 ENCOUNTER — Encounter: Payer: 59 | Admitting: Physical Therapy

## 2016-08-16 ENCOUNTER — Encounter: Payer: Self-pay | Admitting: Physical Therapy

## 2016-08-16 ENCOUNTER — Other Ambulatory Visit: Payer: Self-pay | Admitting: Family Medicine

## 2016-08-16 ENCOUNTER — Ambulatory Visit: Payer: 59 | Admitting: Physical Therapy

## 2016-08-16 DIAGNOSIS — M6281 Muscle weakness (generalized): Secondary | ICD-10-CM | POA: Diagnosis not present

## 2016-08-16 DIAGNOSIS — R279 Unspecified lack of coordination: Secondary | ICD-10-CM

## 2016-08-16 DIAGNOSIS — M62838 Other muscle spasm: Secondary | ICD-10-CM

## 2016-08-16 NOTE — Therapy (Signed)
[facility] [address] [place], [place], [address] Phone: [phone]   Fax:  [phone]  Physical Therapy Treatment  Patient Details  Name: Dorothy [name] MRN: [id number] Date of Birth: 1955/11/21 Referring Provider: Dr. Harl Bowie  Encounter Date: 08/16/2016      PT End of Session - 08/16/16 1007    Visit Number 6   Date for PT Re-Evaluation 10/15/16   PT Start Time 0930   PT Stop Time 1008   PT Time Calculation (min) 38 min   Activity Tolerance Patient tolerated treatment well   Behavior During Therapy [facility] for tasks assessed/performed      Past Medical History:  Diagnosis Date  . Allergic rhinitis, cause unspecified   . Anxiety   . B12 deficiency   . Benign paroxysmal positional vertigo   . Choledocholithiasis   . Congestive heart failure ([place])   . Depression   . Diabetes mellitus   . Fatty liver   . Fatty liver 09/04/12  . Fibromyalgia   . Gastritis   . GERD (gastroesophageal reflux disease)   . Hematuria, unspecified   . Hiatal hernia   . Hx of adenomatous colonic polyps   . Hx of cardiovascular stress test    ETT-Myoview (12/15):  No ischemia.  No ECG changes.  EF 73%.  Low Risk.   [name] Hx of echocardiogram    Echo (12/15):  EF 60-65%, no RWMA, normal diast function, mild RVE  . Hyperlipidemia   . Meniere's disease, unspecified   . Migraine   . Mononeuritis of unspecified site   . Osteoarthritis   . Osteoporosis, unspecified   . Otosclerosis, unspecified   . Peptic ulcer, unspecified site, unspecified as acute or chronic, without mention of hemorrhage, perforation, or obstruction   . Symptomatic menopausal or female climacteric states   . Unspecified disorder of kidney and ureter     Past Surgical History:  Procedure Laterality Date  . BILE DUCT EXPLORATION     gallstone removed  . BREAST ENHANCEMENT SURGERY Bilateral   . BUNIONECTOMY Left 12/2013  . CHOLECYSTECTOMY    . FOOT  SURGERY     right  . 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 MOUTH SURGERY    . SHOULDER SURGERY     left  . TOTAL ABDOMINAL HYSTERECTOMY     pelvic pain/scar tissue; ovaries intact.  . TUBAL LIGATION      There were no vitals filed for this visit.      Subjective Assessment - 08/16/16 0931    Subjective Overall I am doing better. I got another cold. I am starting the gym after the holidays and next visit would like to learn the exercises.    Patient Stated Goals be able to go out to eat and not have an accident   Currently in Pain? Yes   Pain Score 4    Pain Location Back   Pain Orientation Right   Pain Descriptors / Indicators Sore   Pain Type Acute pain   Pain Radiating Towards down right thigh into right knee   Pain Onset In the past 7 days   Pain Frequency Intermittent   Aggravating Factors  sitting   Pain Relieving Factors massage   Multiple Pain Sites No  [facility] Adult PT Treatment/Exercise - 08/16/16 0001      Manual Therapy   Manual Therapy Soft tissue mobilization   Soft tissue mobilization right piriformis, right levator ani, right gluteal, right side of SI joint in left sidely                PT Education - 08/16/16 1007    Education provided Yes   Education Details stretch for right hip and back   Person(s) Educated Patient   Methods Explanation;Demonstration;Handout   Comprehension Returned demonstration;Verbalized understanding          PT Short Term Goals - 07/12/16 1021      PT SHORT TERM GOAL #1   Title independent with initial HEP   Time 4   Period Weeks   Status Achieved     PT SHORT TERM GOAL #2   Title ability to contract the puborectalis so she is able to start to slow down fecal incontinence   Time 4   Period  Weeks   Status Achieved     PT SHORT TERM GOAL #3   Title pain with intercourse decreased >/= 25%   Time 4   Period Weeks   Status Achieved     PT SHORT TERM GOAL #4   Title understand how to perform abdominal massage to assist in bowel movements and reduce trigger points   Time 4   Period Weeks   Status Achieved     PT SHORT TERM GOAL #5   Title understand how to perfrom self perineal massage to relax the pelvic floor muscles so they can contract correctly   Time 4   Period Weeks   Status Achieved           PT Long Term Goals - 08/16/16 0932      PT LONG TERM GOAL #1   Title independent with HEP    Time 4   Period Months   Status On-going     PT LONG TERM GOAL #2   Title ability to contract anal spinter with lift at strength level 4/5 to reduce fecal incontinence >/= 70% after she eats   Time 4   Period Months   Status On-going  60% better     PT LONG TERM GOAL #3   Title penile penetration with intercourse pain decreased >/= 75% due to her ability to relax the tissue   Time 4   Period Months   Status On-going  have not had intercourse     PT LONG TERM GOAL #4   Title understand moisterizers and lubricants to reduce vaginal dryness for improved vaginal health   Time 4   Period Months   Status Achieved     PT LONG TERM GOAL #5   Title FOTO score </= 40% limitation   Time 4   Period Months   Status New               Plan - 08/16/16 0936    Clinical Impression Statement Patient pain after treatment reduced to 3/10.  Patient had trigger points in piriformis origin and levator ani muscle.  Patient understands how to stretch the right gluteal and piriformis area.  Patient was not up to exercise today due to feeling sick. Fecal incontinence after eating improved by 60%. Patient will benefit from skilled therapy to improve toileting with increase pelvic floor control.    Rehab Potential Excellent   Clinical Impairments Affecting Rehab Potential None    PT Frequency 1x /  week   PT Duration Other (comment)  4 months   PT Treatment/Interventions Biofeedback;Electrical Stimulation;Ultrasound;Moist Heat;Therapeutic activities;Therapeutic exercise;Neuromuscular re-education;Patient/family education;Passive range of motion;Manual techniques;Dry needling   PT Next Visit Plan  exercise at the gym;  massage to diaphragm, bulging of pelvic floor ., release of low back; abdominal contraction   PT Home Exercise Plan progress as needed   Consulted and Agree with Plan of Care Patient      Patient will benefit from skilled therapeutic intervention in order to improve the following deficits and impairments:  Decreased coordination, Decreased range of motion, Increased fascial restricitons, Increased muscle spasms, Decreased activity tolerance, Pain, Decreased strength, Decreased mobility  Visit Diagnosis: Muscle weakness (generalized)  Other muscle spasm  Unspecified lack of coordination     Problem List Patient Active Problem List   Diagnosis Date Noted  . Osteoporosis with current pathological fracture 06/19/2016  . Other secondary osteoarthritis of both hands 04/15/2016  . Osteoarthritis of pelvis 04/15/2016  . Degenerative disc disease, lumbar 04/15/2016  . Neuropathy ([place]) 04/15/2016  . IBS (irritable bowel syndrome) 04/15/2016  . Chronic diastolic heart failure ([place]) 06/14/2015  . Premature ventricular contractions 12/10/2013  . Fibromyalgia 10/03/2012  . Abdominal pain, epigastric 10/01/2012  . DIARRHEA, CHRONIC 10/26/2009  . COLONIC POLYPS, ADENOMATOUS 09/21/2007  . DM2 (diabetes mellitus, type 2) ([place]) 09/21/2007  . HLD (hyperlipidemia) 09/21/2007  . Anxiety state 09/21/2007  . DEPRESSION 09/21/2007  . GERD 09/21/2007  . HIATAL HERNIA 09/21/2007  . CHOLEDOCHOLITHIASIS, HX OF 09/21/2007  . Acute gastritis 07/30/2007    Earlie Counts, PT 08/16/16 10:11 AM   [facility] [address] [place], [place], [address] Phone: [phone]   Fax:  [phone]  Name: SYMPHONY CRIADO MRN: [id number] Date of Birth: Dec 26, 1955

## 2016-08-16 NOTE — Patient Instructions (Addendum)
Piriformis Stretch, Sitting    Sit, one ankle on opposite knee, same-side hand on crossed knee. Push down on knee, keeping spine straight. Lean torso forward, with flat back, until tension is felt in hamstrings and gluteals of crossed-leg side. Hold __30_ seconds.  Repeat __2_ times per session. Do _1__ sessions per day.  Copyright  VHI. All rights reserved.  Stretching: Piriformis (Supine)    Pull right knee toward opposite shoulder. Hold __30__ seconds. Relax. Repeat _2___ times per set. Do ___1_ sets per session. Do _1___ sessions per day.  http://orth.exer.us/712   Copyright  VHI. All rights reserved.  Mid-Back Stretch    Push chest toward floor, reaching forward as far as possible. Hold _30___ seconds. Repeat _2___ times per set. Do __1__ sets per session. Do __1__ sessions per day.  http://orth.exer.us/130   Copyright  VHI. All rights reserved.  [place] [address], [place], [place] [address] Phone # [phone] Fax [phone]

## 2016-08-21 ENCOUNTER — Other Ambulatory Visit: Payer: Self-pay | Admitting: Rheumatology

## 2016-08-23 ENCOUNTER — Ambulatory Visit (INDEPENDENT_AMBULATORY_CARE_PROVIDER_SITE_OTHER): Payer: 59 | Admitting: Neurology

## 2016-08-23 ENCOUNTER — Encounter: Payer: Self-pay | Admitting: Neurology

## 2016-08-23 ENCOUNTER — Ambulatory Visit: Payer: 59 | Admitting: Physical Therapy

## 2016-08-23 ENCOUNTER — Encounter: Payer: Self-pay | Admitting: Physical Therapy

## 2016-08-23 VITALS — BP 134/83 | HR 72 | Resp 20 | Ht 65.0 in | Wt 171.0 lb

## 2016-08-23 DIAGNOSIS — R279 Unspecified lack of coordination: Secondary | ICD-10-CM

## 2016-08-23 DIAGNOSIS — M6281 Muscle weakness (generalized): Secondary | ICD-10-CM | POA: Diagnosis not present

## 2016-08-23 DIAGNOSIS — M62838 Other muscle spasm: Secondary | ICD-10-CM

## 2016-08-23 DIAGNOSIS — G4761 Periodic limb movement disorder: Secondary | ICD-10-CM | POA: Diagnosis not present

## 2016-08-23 DIAGNOSIS — G2581 Restless legs syndrome: Secondary | ICD-10-CM

## 2016-08-23 MED ORDER — PRAMIPEXOLE DIHYDROCHLORIDE 0.125 MG PO TABS: 0.2500 mg | ORAL_TABLET | Freq: Every day | ORAL | 3 refills | Status: DC

## 2016-08-23 NOTE — Patient Instructions (Signed)
Your exam looks good! Let's continue with low dose pramipexole.  Let's do a one year check up.

## 2016-08-23 NOTE — Telephone Encounter (Signed)
Last visit 12/20/15  Next visit 09/20/16 Ok to refill Lyrica ?

## 2016-08-23 NOTE — Progress Notes (Signed)
Subjective:    Patient ID: Dorothy [name] is a 60 y.o. female.  HPI     Interim history:   Dorothy [name] is a 60 year old right-handed woman with an underlying complex medical history of diabetes, hyperlipidemia, depression, fatty liver, reflux disease, anxiety, gallstones, migraine headaches, osteoarthritis, peptic ulcer disease, vitamin B12 deficiency, Mnire's disease and benign positional vertigo, allergic rhinitis, hiatal hernia and fibromyalgia, who presents for followup consultation of her RLS. She is unaccompanied today. I last saw her on 07/08/2015, at which time she reported doing well on Mirapex, on just 0.25 mg each night. She had no SEs. She had a follow-up with Dr. Burt Knack in cardiology in June 2016. She quit her job.   Today, 08/23/16: She reports Doing well with generic Mirapex, 0.25 mg each night, she takes it around dinnertime. She denies any side effects and feels it has been working well. 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 Will be driving to [place] to see her kids. She has been having pelvic physical therapy with good success. She has no new complaints, recent eye exam was unremarkable and recent physical with PCP was unremarkable.   Previously:    I saw her on 12/10/2014, at which time she reported that she was diagnosed with congestive heart failure, in November 2015. She had developed shortness of breath about 6 months prior. She had seen a cardiologist. Her symptoms gradually worsened. She had to go to the emergency room on 07/27/2014 because of sudden worsening of shortness of breath. She was diagnosed with CHF. She had a CXR 2 views on 07/27/14: 1. Cardiomegaly and pulmonary venous congestion without definite evidence of edema. 2. Slight worsening bibasilar atelectasis, left greater than right.  She had a nuclear stress test on 08/14/2014 which was negative. She had an echocardiogram on 08/04/2014: EF was 60-65%, wall motion  was normal, all in all it was a normal study. She was started on Lasix and was able to lose several fluid pounds. She had developed lower extremity edema which improved. Her breathing improved. As far as her RLS, unfortunately she had started noticing itching and burning at the site of the patches and while the neupro was still helpful, the site reactions were troublesome. She started reducing the time the patch was on down from 24 hours to about 8 hours per day. She noticed that her restless leg symptoms flared up with that. She was on an iron supplement. I asked her to discontinue Neupro patch altogether. I suggested we start her on Mirapex low-dose with slow titration, 0.125 mg strength.   I saw her on 06/09/2014, at which time she reported doing better with Neupro. She was on the 2 mg patch. She had no side effects and felt she was sleeping much better. She reported that her little dog unfortunately found one of her old patches that fell off while she was changing and chewed on it and apparently had a overdose type reaction to it. She had to rush him to the vet. Since then she was very careful in removing and applying the patch. She noticed that one time when she forgot to change the patch she had a flareup of her RLS symptoms. She was taking iron supplements. She was trying to lose weight and joined a gym. She was on a second diabetes medication per primary care physician.    I saw her on 01/09/2014, at which time she reported significant RLS symptoms and difficulty  with sleep onset as well as sleep maintenance. Sleep issues have been a long-standing problem for her. She also reported a recent cardiac workup and other than PVCs there were no abnormalities found. I talked her about her sleep test results in detail. I suggested we check iron studies. Her ferritin level was 10 and we called her with the test results. I advised her to start an over-the-counter iron supplement. Her hemoglobin and hematocrit were  slightly low which were checked by her primary care provider a few days later. I also suggested she start Neupro patch for symptomatic treatment of RLS. I started her on 1 mg strength with increased to 2 mg. She has been diagnosed with diabetes in the interim. She follows with Dr. Estanislado Pandy for arthritis.    I first met her on 11/13/2013 at the request of her primary care physician, at which time she reported intermittent shortness of breath, palpitations, indigestion, and excessive daytime somnolence, with a need for daily nap after work between 2 and 6 PM. She also reported restless leg symptoms and jerking in her sleep. She reported having had a sleep study many years ago which she recalled showed snoring. 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 She has braces on top, which may come out soon. 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 She is a non-smoker and drinks alcohol very occasionally and does not drink caffeine. She lost about 15 lb from the jaw surgery, but has mostly gained it back. I suggested she return for sleep study.    She she had a baseline sleep study on 12/17/2013: Sleep efficiency was reduced at 62.9% latency to sleep of 73 minutes and wake after sleep onset of 77 minutes with moderate to severe sleep fragmentation noted. She had an elevated arousal index of 28.5 per hour primarily because of periodic leg movements. She had a normal percentage of stage I sleep, a markedly increased percentage of stage II sleep, and absence of slow-wave and REM sleep. She had severe periodic leg movements of sleep. Index was 76.9 per hour with an associated arousal index of 21.5 per hour. She had no significant EKG changes. Minimal intermittent snoring was noted. She slept mostly in the lateral positions. She had a total AHI of 4 per hour, rising to 25.4 per hour in the supine position.  Baseline oxygen saturation was 95%, nadir was 90%. I felt that the absence of REM sleep and her estimated her potential underlying sleep disorder breathing. I felt that her Effexor may be a contributor to her restless leg symptoms and PLMS.     Her Past Medical History Is Significant For: Past Medical History:  Diagnosis Date  . Allergic rhinitis, cause unspecified   . Anxiety   . B12 deficiency   . Benign paroxysmal positional vertigo   . Choledocholithiasis   . Congestive heart failure ([place])   . Depression   . Diabetes mellitus   . Fatty liver   . Fatty liver 09/04/12  . Fibromyalgia   . Gastritis   . GERD (gastroesophageal reflux disease)   . Hematuria, unspecified   . Hiatal hernia   . Hx of adenomatous colonic polyps   . Hx of cardiovascular stress test    ETT-Myoview (12/15):  No ischemia.  No ECG changes.  EF 73%.  Low Risk.   [name] Hx of echocardiogram    Echo (12/15):  EF 60-65%, no RWMA, normal diast function, mild RVE  . Hyperlipidemia   . Meniere's disease, unspecified   . Migraine   . Mononeuritis of unspecified site   . Osteoarthritis   . Osteoporosis, unspecified   . Otosclerosis, unspecified   . Peptic ulcer, unspecified site, unspecified as acute or chronic, without mention of hemorrhage, perforation, or obstruction   . Symptomatic menopausal or female climacteric states   . Unspecified disorder of kidney and ureter     Her Past Surgical History Is Significant For: Past Surgical History:  Procedure Laterality Date  . BILE DUCT EXPLORATION     gallstone removed  . BREAST ENHANCEMENT SURGERY Bilateral   . BUNIONECTOMY Left 12/2013  . CHOLECYSTECTOMY    . FOOT SURGERY     right  . 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 MOUTH SURGERY    . SHOULDER SURGERY     left  . TOTAL  ABDOMINAL HYSTERECTOMY     pelvic pain/scar tissue; ovaries intact.  . TUBAL LIGATION      Her Family History Is Significant For: Family History  Problem Relation Age of Onset  . Prostate cancer Father   . Lung disease Father   . Diabetes Father   . Heart disease Father 25    CHF, AMI multiple/CABG  . COPD Father   . Heart attack Father   . Cancer Father     testicular cancer  . Diabetes Mother   . Hypertension Mother   . Hyperlipidemia Mother   . COPD Mother   . Cancer Mother     lung cancer  . Diabetes Brother   . Hypertension Brother   . Obesity Brother   . Hyperlipidemia Brother   . COPD Brother   . Stroke Maternal Grandmother   . Heart disease Maternal Grandfather   . COPD Paternal Grandmother   . Diabetes Paternal Grandmother   . COPD Paternal Grandfather   . Heart attack Paternal Grandfather   . Colon cancer Cousin     died age 73  . Breast cancer Maternal Aunt     Her Social History Is Significant For: Social History   Social History  . Marital status: Married    Spouse name: Richardson Landry  . Number of children: 3  . Years of education: 10th grade   Occupational History  .  [employer]   Social History Main Topics  . Smoking status: Never Smoker  . Smokeless tobacco: Never Used  . Alcohol use 0.0 oz/week     Comment: occasional 1 x month  . Drug use: No  . Sexual activity: Yes    Birth control/ protection: Post-menopausal, Surgical   Other Topics Concern  . None   Social History Narrative      Marital status:  Married x 21 years; happily married,no abuse. 2nd marriage.      Children: 3 sons (41, 38, 37)---2 sons with substance abuse; 3 grandchildren; 3 step grandchildren; 2 gg.      Lives: with husband, son/Brian.      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      Drugs:none      Exercise: exercising at gym in 2017; 2  days per week; walking  and weight lifting.      Seatbelt: 100%; no texting      Guns: none      Always uses seat belts. Smoke alarm and carbon monoxide detector in the home.No caffeine use. No unsecured guns in the home.     Her Allergies Are:  Allergies  Allergen Reactions  . Bactrim [Sulfamethoxazole-Trimethoprim] Other (See Comments)    blisters  . Keflex [Cephalexin] Hives  . Macrobid [Nitrofurantoin Macrocrystal] Other (See Comments)    blisters  . Nitrofurantoin Nausea And Vomiting and Other (See Comments)    Blisters   . Sulfa Drugs Cross Reactors Itching  . Adhesive [Tape] Other (See Comments)    Tears skins  . Codeine Itching  . Talwin [Pentazocine] Nausea And Vomiting and Rash    Muscle cramps and vision changes  :   Her Current Medications Are:  Outpatient Encounter Prescriptions as of 08/23/2016  Medication Sig  . alendronate (FOSAMAX) 70 MG tablet Take 1 tablet (70 mg total) by mouth once a week. Take with a full glass of water on an empty stomach.  [name] aspirin 81 MG tablet Take 81 mg by mouth every evening.  . Calcium Carb-Cholecalciferol (CALCIUM 500 +D) 500-400 MG-UNIT TABS Take 1 tablet by mouth daily.   . Cholecalciferol (VITAMIN D3) 2000 UNITS TABS Take 2,000 Units by mouth daily.   . cholestyramine light 4 g POWD Take 1 packet (4 g total) by mouth 2 (two) times daily.  [name] DEXILANT 60 MG capsule TAKE ONE CAPSULE BY MOUTH DAILY  . fluticasone (FLONASE) 50 MCG/ACT nasal spray Place 2 sprays into both nostrils daily.  . hydrochlorothiazide (HYDRODIURIL) 25 MG tablet Take 1 tablet (25 mg total) by mouth daily.  [name] ipratropium (ATROVENT) 0.03 % nasal spray Place 2 sprays into the nose 2 (two) times daily.  [name] losartan (COZAAR) 50 MG tablet TAKE 1 TABLET (50 MG TOTAL) BY MOUTH DAILY.  . metaxalone (SKELAXIN) 800 MG tablet Take 800 mg by mouth 3 (three) times daily.  . metFORMIN (GLUCOPHAGE) 1000 MG tablet TAKE 1 TABLET (1,000 MG TOTAL) BY MOUTH 2 (TWO) TIMES DAILY WITH A MEAL  . omega-3 acid  ethyl esters (LOVAZA) 1 G capsule Take 1 g by mouth 2 (two) times daily.  . Potassium Chloride ER 20 MEQ TBCR Take 2 tablets (40 meQ) by mouth daily  . pramipexole (MIRAPEX) 0.125 MG tablet TAKE 3 TABLETS (0.375 MG TOTAL) BY MOUTH AT BEDTIME.  . pregabalin (LYRICA) 50 MG capsule Take 50 mg by mouth 2 (two) times daily.  . simvastatin (ZOCOR) 40 MG tablet Take 1 tablet (40 mg total) by mouth at bedtime.  . sitaGLIPtin (JANUVIA) 100 MG tablet Take 1 tablet (100 mg total) by mouth daily.  [name] venlafaxine XR (EFFEXOR-XR) 75 MG 24 hr capsule Take 1 capsule (75 mg total) by mouth every evening.  [name] VOLTAREN 1 % GEL Apply 2 g topically 2 (two) times daily as needed (pain).   . [DISCONTINUED] azithromycin (ZITHROMAX) 250 MG tablet Two tablets daily x 1 day then one tablet daily x 4 days  . [DISCONTINUED] furosemide (LASIX) 20 MG tablet Take 1 tablet (20 mg total) by mouth daily.  . [DISCONTINUED] loperamide (IMODIUM) 2 MG capsule Take 2 mg by mouth as needed for diarrhea or loose stools.  . [DISCONTINUED] methocarbamol (ROBAXIN) 500 MG tablet Use 8am and 2pm prn muscle spasms (Patient not taking: Reported on 07/26/2016)  . [DISCONTINUED] PREMARIN 0.45 MG tablet TAKE 1 TABLET  BY MOUTH DAILY (Patient not taking: Reported on 07/26/2016)  . [DISCONTINUED] sucralfate (CARAFATE) 1 g tablet Take 1 g by mouth 4 (four) times daily -  with meals and at bedtime.   No facility-administered encounter medications on file as of 08/23/2016.   :  Review of Systems:  Out of a complete 14 point review of systems, all are reviewed and negative with the exception of these symptoms as listed below: Review of Systems  Musculoskeletal: Positive for arthralgias, back pain, myalgias, neck pain and neck stiffness.  Neurological:       Pt presents today to discuss her RLS. Pt says that she is doing well only taking 2 tablets of mirapex at bedtime, instead of the prescribed 3 tablets.    Objective:  Neurologic Exam  Physical  Exam Physical Examination:   Vitals:   08/23/16 0757  BP: 134/83  Pulse: 72  Resp: 20   General Examination: The patient is a very pleasant 60 y.o. female in no acute distress. She appears well-developed and well-nourished and well groomed. She is in good spirits today.   HEENT: Normocephalic, atraumatic, pupils are equal, round and reactive to light and accommodation. Extraocular tracking is good without limitation to gaze excursion or nystagmus noted. Normal smooth pursuit is noted. Hearing is grossly intact. Face is symmetric with normal facial animation and normal facial sensation. Speech is clear with no dysarthria noted. There is no hypophonia. There is no lip, neck/head, jaw or voice tremor. Neck is supple with full range of passive and active motion. There are no carotid bruits on auscultation. Oropharynx exam reveals: moderate mouth dryness, adequate dental hygiene and moderate airway crowding, due to larger and elongated uvula and narrow airway entry. Mallampati is class II. Tongue protrudes centrally and palate elevates symmetrically. Tonsils are 1+ in size.   Chest: Clear to auscultation without wheezing, rhonchi or crackles noted.  Heart: S1+S2+0, regular and normal without murmurs, rubs or gallops noted.   Abdomen: Soft, non-tender and non-distended with normal bowel sounds appreciated on auscultation.  Extremities: There is no edema.   Skin: Dry without trophic changes noted. There are no varicose veins.   Musculoskeletal: exam reveals no obvious joint deformities, tenderness or joint swelling or erythema.   Neurologically:  Mental status: The patient is awake, alert and oriented in all 4 spheres. Her immediate and remote memory, attention, language skills and fund of knowledge are appropriate. There is no evidence of aphasia, agnosia, apraxia or anomia. Speech is clear with normal prosody and enunciation. Thought process is linear. Mood is normal and affect is normal.   Cranial nerves II - XII are as described above under HEENT exam. In addition: shoulder shrug is normal with equal shoulder height noted. Motor exam: Normal bulk, strength and tone is noted. There is no drift, tremor or rebound. Romberg is negative. Reflexes are 2 to 3+ throughout. Babinski: Toes are flexor bilaterally. Fine motor skills and coordination: intact with normal finger taps, normal hand movements, normal rapid alternating patting, normal foot taps and normal foot agility.  Cerebellar testing: No dysmetria or intention tremor on finger to nose testing. Heel to shin is unremarkable bilaterally. There is no truncal or gait ataxia.  Sensory exam: intact to light touch, vibration, and temperature sense in the upper and lower extremities.  Gait, station and balance: She stands easily. No veering to one side is noted. No leaning to one side is noted. Posture is age-appropriate and stance is narrow based. Gait shows normal stride length  and normal pace. No problems turning are noted. Tandem walk is unremarkable.           Assessment and Plan:   In summary, SAMORIA FEDORKO is a very pleasant 60 year old female with an underlying complex medical history of diabetes, hyperlipidemia, depression, fatty liver, reflux disease, anxiety, gallstones, migraine headaches, osteoarthritis, peptic ulcer disease, vitamin B12 deficiency, Mnire's disease and benign positional vertigo, allergic rhinitis, hiatal hernia and fibromyalgia, who presents for followup consultation of her restless leg syndrome and PLMs. In May 2015, she started Neupro and titrated this up to 2 mg once daily with improvement in her RLS symptoms and better sleep but she developed itching and burning at the site of the patches and had to stop it in 4/16. She then started low-dose Mirapex starting at 0.125 mg each night with gradual titration. She has been on 2 pills each night with good results. She has no side effects. Her exam is stable. I  suggested a one year checkup, sooner if needed. I provided her with a new Rx for Mirapex generic, 90 day supply with refills. I spent 20 minutes in total face-to-face time with the patient, more than 50% of which was spent in counseling and coordination of care, reviewing test results, reviewing medication and discussing or reviewing the diagnosis of RLS, and PLMD, the prognosis and treatment options.

## 2016-08-23 NOTE — Therapy (Signed)
[facility] [address] [place], [place], [address] Phone: [phone]   Fax:  [phone]  Physical Therapy Treatment  Patient Details  Name: Dorothy [name] MRN: [id number] Date of Birth: [birth year] Referring Provider: Dr. Harl Bowie  Encounter Date: 08/23/2016      PT End of Session - 08/23/16 1046    Visit Number 7   Date for PT Re-Evaluation 10/15/16   PT Start Time 0867   PT Stop Time 1053   PT Time Calculation (min) 38 min   Activity Tolerance Patient tolerated treatment well   Behavior During Therapy [facility] for tasks assessed/performed      Past Medical History:  Diagnosis Date  . Allergic rhinitis, cause unspecified   . Anxiety   . B12 deficiency   . Benign paroxysmal positional vertigo   . Choledocholithiasis   . Congestive heart failure ([place])   . Depression   . Diabetes mellitus   . Fatty liver   . Fatty liver 09/04/12  . Fibromyalgia   . Gastritis   . GERD (gastroesophageal reflux disease)   . Hematuria, unspecified   . Hiatal hernia   . Hx of adenomatous colonic polyps   . Hx of cardiovascular stress test    ETT-Myoview (12/15):  No ischemia.  No ECG changes.  EF 73%.  Low Risk.   [name] Hx of echocardiogram    Echo (12/15):  EF 60-65%, no RWMA, normal diast function, mild RVE  . Hyperlipidemia   . Meniere's disease, unspecified   . Migraine   . Mononeuritis of unspecified site   . Osteoarthritis   . Osteoporosis, unspecified   . Otosclerosis, unspecified   . Peptic ulcer, unspecified site, unspecified as acute or chronic, without mention of hemorrhage, perforation, or obstruction   . Symptomatic menopausal or female climacteric states   . Unspecified disorder of kidney and ureter     Past Surgical History:  Procedure Laterality Date  . BILE DUCT EXPLORATION     gallstone removed  . BREAST ENHANCEMENT SURGERY Bilateral   . BUNIONECTOMY Left 12/2013  . CHOLECYSTECTOMY    . FOOT  SURGERY     right  . 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 MOUTH SURGERY    . SHOULDER SURGERY     left  . TOTAL ABDOMINAL HYSTERECTOMY     pelvic pain/scar tissue; ovaries intact.  . TUBAL LIGATION      There were no vitals filed for this visit.      Subjective Assessment - 08/23/16 1019    Subjective No change with pelvic floor.  My sciatica is still bothering me.    Patient Stated Goals be able to go out to eat and not have an accident   Currently in Pain? Yes   Pain Score 6    Pain Location Back   Pain Orientation Right   Pain Descriptors / Indicators Sore   Pain Type Acute pain   Pain Radiating Towards down right thigh into right knee   Pain Onset In the past 7 days   Aggravating Factors  sitting   Pain Relieving Factors massage   Multiple Pain Sites No                         [facility] Adult PT  Treatment/Exercise - 08/23/16 0001      Lumbar Exercises: Aerobic   Stationary Bike nustep level 3 seat#8, arm #12 6 min     Lumbar Exercises: Supine   Bridge 10 reps;1 second   Bridge Limitations slight pain in back at end range   Other Supine Lumbar Exercises supine with feet on ball-bringing knees to chest with abdominal contraction, alternate SLR, ring one leg out to the side and downward,      Lumbar Exercises: Sidelying   Clam 20 reps  bilateral with abdominal contraction   Hip Abduction 10 reps;Other (comment)  2 sets bil.    Other Sidelying Lumbar Exercises hip adduction 2 sets of 10 bil.      Lumbar Exercises: Quadruped   Straight Leg Raise 10 reps;1 second;Other (comment)   Straight Leg Raises Limitations on red physioball; hip extension wiht knee flexed                PT Education - 08/23/16 1045    Education provided Yes   Education Details return to  doing her initial HEP since she has not been consistent   Person(s) Educated Patient   Methods Explanation;Demonstration   Comprehension Verbalized understanding;Returned demonstration          PT Short Term Goals - 07/12/16 1021      PT SHORT TERM GOAL #1   Title independent with initial HEP   Time 4   Period Weeks   Status Achieved     PT SHORT TERM GOAL #2   Title ability to contract the puborectalis so she is able to start to slow down fecal incontinence   Time 4   Period Weeks   Status Achieved     PT SHORT TERM GOAL #3   Title pain with intercourse decreased >/= 25%   Time 4   Period Weeks   Status Achieved     PT SHORT TERM GOAL #4   Title understand how to perform abdominal massage to assist in bowel movements and reduce trigger points   Time 4   Period Weeks   Status Achieved     PT SHORT TERM GOAL #5   Title understand how to perfrom self perineal massage to relax the pelvic floor muscles so they can contract correctly   Time 4   Period Weeks   Status Achieved           PT Long Term Goals - 08/23/16 1020      PT LONG TERM GOAL #1   Title independent with HEP    Time 4   Period Months   Status On-going     PT LONG TERM GOAL #2   Title ability to contract anal spinter with lift at strength level 4/5 to reduce fecal incontinence >/= 70% after she eats   Time 4   Period Months   Status On-going  60% better     PT LONG TERM GOAL #3   Title penile penetration with intercourse pain decreased >/= 75% due to her ability to relax the tissue   Time 4   Period Months   Status On-going  has not had intercourse     PT LONG TERM GOAL #4   Title understand moisterizers and lubricants to reduce vaginal dryness for improved vaginal health   Time 4   Period Months   Status Achieved     PT LONG TERM GOAL #5   Title FOTO score </= 40% limitation   Period Months  Status On-going               Plan - 08/23/16 1046    Clinical Impression  Statement Patient has not met goals due to progress is 60% better. Patient has not been able to exercise consistently due to the holidays.  Patient continues to have sciatica pain and therapist recommended her to see her doctor. Patient is exercising in therapy more to improve overall strength and reduce fecal incontinence.    Rehab Potential Excellent   Clinical Impairments Affecting Rehab Potential None   PT Frequency 1x / week   PT Duration Other (comment)  4 months   PT Treatment/Interventions Biofeedback;Electrical Stimulation;Ultrasound;Moist Heat;Therapeutic activities;Therapeutic exercise;Neuromuscular re-education;Patient/family education;Passive range of motion;Manual techniques;Dry needling   PT Next Visit Plan  exercise at the gym;   bulging of pelvic floor . abdominal contraction   PT Home Exercise Plan progress as needed   Consulted and Agree with Plan of Care Patient      Patient will benefit from skilled therapeutic intervention in order to improve the following deficits and impairments:  Decreased coordination, Decreased range of motion, Increased fascial restricitons, Increased muscle spasms, Decreased activity tolerance, Pain, Decreased strength, Decreased mobility  Visit Diagnosis: Muscle weakness (generalized)  Other muscle spasm  Unspecified lack of coordination     Problem List Patient Active Problem List   Diagnosis Date Noted  . Osteoporosis with current pathological fracture 06/19/2016  . Other secondary osteoarthritis of both hands 04/15/2016  . Osteoarthritis of pelvis 04/15/2016  . Degenerative disc disease, lumbar 04/15/2016  . Neuropathy ([place]) 04/15/2016  . IBS (irritable bowel syndrome) 04/15/2016  . Chronic diastolic heart failure ([place]) 06/14/2015  . Premature ventricular contractions 12/10/2013  . Fibromyalgia 10/03/2012  . Abdominal pain, epigastric 10/01/2012  . DIARRHEA, CHRONIC 10/26/2009  . COLONIC POLYPS, ADENOMATOUS 09/21/2007  . DM2  (diabetes mellitus, type 2) ([place]) 09/21/2007  . HLD (hyperlipidemia) 09/21/2007  . Anxiety state 09/21/2007  . DEPRESSION 09/21/2007  . GERD 09/21/2007  . HIATAL HERNIA 09/21/2007  . CHOLEDOCHOLITHIASIS, HX OF 09/21/2007  . Acute gastritis 07/30/2007    Earlie Counts, PT 08/23/16 10:52 AM   [facility] [address] [place], [place], [address] Phone: [phone]   Fax:  [phone]  Name: Dorothy [name] MRN: [id number] Date of Birth: 1956/05/11

## 2016-08-25 ENCOUNTER — Other Ambulatory Visit: Payer: Self-pay | Admitting: *Deleted

## 2016-08-25 MED ORDER — PREGABALIN 50 MG PO CAPS
50.0000 mg | ORAL_CAPSULE | Freq: Three times a day (TID) | ORAL | 2 refills | Status: DC
Start: 2016-08-25 — End: 2016-09-20

## 2016-08-25 NOTE — Telephone Encounter (Signed)
Lyrica prescription is unreadable due to printer ink. Prescription to be reprinted.

## 2016-08-30 ENCOUNTER — Ambulatory Visit: Payer: 59 | Attending: Gastroenterology | Admitting: Physical Therapy

## 2016-08-30 ENCOUNTER — Encounter: Payer: Self-pay | Admitting: Physical Therapy

## 2016-08-30 DIAGNOSIS — R279 Unspecified lack of coordination: Secondary | ICD-10-CM

## 2016-08-30 DIAGNOSIS — M6281 Muscle weakness (generalized): Secondary | ICD-10-CM

## 2016-08-30 DIAGNOSIS — M62838 Other muscle spasm: Secondary | ICD-10-CM | POA: Insufficient documentation

## 2016-08-30 NOTE — Therapy (Signed)
[facility] [address] [place], [place], [address] Phone: [phone]   Fax:  [phone]  Physical Therapy Treatment  Patient Details  Name: Dorothy [name] MRN: [id number] Date of Birth: 11/18/1955 Referring Provider: Dr. Harl Bowie  Encounter Date: 08/30/2016      PT End of Session - 08/30/16 1014    Visit Number 8   Date for PT Re-Evaluation 10/15/16   PT Start Time 0930   PT Stop Time 1015   PT Time Calculation (min) 45 min   Activity Tolerance Patient tolerated treatment well   Behavior During Therapy [facility] for tasks assessed/performed      Past Medical History:  Diagnosis Date  . Allergic rhinitis, cause unspecified   . Anxiety   . B12 deficiency   . Benign paroxysmal positional vertigo   . Choledocholithiasis   . Congestive heart failure ([place])   . Depression   . Diabetes mellitus   . Fatty liver   . Fatty liver 09/04/12  . Fibromyalgia   . Gastritis   . GERD (gastroesophageal reflux disease)   . Hematuria, unspecified   . Hiatal hernia   . Hx of adenomatous colonic polyps   . Hx of cardiovascular stress test    ETT-Myoview (12/15):  No ischemia.  No ECG changes.  EF 73%.  Low Risk.   [name] Hx of echocardiogram    Echo (12/15):  EF 60-65%, no RWMA, normal diast function, mild RVE  . Hyperlipidemia   . Meniere's disease, unspecified   . Migraine   . Mononeuritis of unspecified site   . Osteoarthritis   . Osteoporosis, unspecified   . Otosclerosis, unspecified   . Peptic ulcer, unspecified site, unspecified as acute or chronic, without mention of hemorrhage, perforation, or obstruction   . Symptomatic menopausal or female climacteric states   . Unspecified disorder of kidney and ureter     Past Surgical History:  Procedure Laterality Date  . BILE DUCT EXPLORATION     gallstone removed  . BREAST ENHANCEMENT SURGERY Bilateral   . BUNIONECTOMY Left 12/2013  . CHOLECYSTECTOMY    . FOOT  SURGERY     right  . 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 MOUTH SURGERY    . SHOULDER SURGERY     left  . TOTAL ABDOMINAL HYSTERECTOMY     pelvic pain/scar tissue; ovaries intact.  . TUBAL LIGATION      There were no vitals filed for this visit.      Subjective Assessment - 08/30/16 0936    Subjective I still have my back pain. Pelvic floor is doing better. I have more control. I am 65-70% better.    Patient Stated Goals be able to go out to eat and not have an accident   Currently in Pain? Yes   Pain Score 3    Pain Location Back   Pain Orientation Right   Pain Descriptors / Indicators Sore   Pain Radiating Towards stays in right SI area.    Pain Onset In the past 7 days   Pain Frequency Intermittent   Aggravating Factors  sitting   Pain Relieving Factors massage   Multiple Pain Sites No  [facility] Adult PT Treatment/Exercise - 08/30/16 0001      Exercises   Exercises Other Exercises   Other Exercises  sit move legs in and out to facilitate hip adductor and abductor machine at gym     Lumbar Exercises: Aerobic   Stationary Bike nustep level 3 seat#8, arm #12 6 min     Lumbar Exercises: Machines for Strengthening   Leg Press Seat #5 bil. legs 60# 20x  burning in inner thigh   Other Lumbar Machine Exercise chest press 15# 10x; lat bar 20# 20x;   VC on posture, abdominal brace; shoulder position     Lumbar Exercises: Supine   Clam 20 reps   Bent Knee Raise 20 reps   Bridge 10 reps;1 second     Shoulder Exercises: Power Development worker, community 20 reps;Other (comment)   Extension Limitations 20#  scapula retraction   Retraction 15 reps  15#   Other Power Tower Exercises tricep 15# 10x; bicep curl 5# 10x bil.                 PT Education -  08/30/16 1013    Education provided Yes   Education Details how to exercise at the gym   Person(s) Educated Patient   Methods Explanation;Demonstration   Comprehension Verbalized understanding;Returned demonstration          PT Short Term Goals - 07/12/16 1021      PT SHORT TERM GOAL #1   Title independent with initial HEP   Time 4   Period Weeks   Status Achieved     PT SHORT TERM GOAL #2   Title ability to contract the puborectalis so she is able to start to slow down fecal incontinence   Time 4   Period Weeks   Status Achieved     PT SHORT TERM GOAL #3   Title pain with intercourse decreased >/= 25%   Time 4   Period Weeks   Status Achieved     PT SHORT TERM GOAL #4   Title understand how to perform abdominal massage to assist in bowel movements and reduce trigger points   Time 4   Period Weeks   Status Achieved     PT SHORT TERM GOAL #5   Title understand how to perfrom self perineal massage to relax the pelvic floor muscles so they can contract correctly   Time 4   Period Weeks   Status Achieved           PT Long Term Goals - 08/30/16 [id number]      PT LONG TERM GOAL #1   Title independent with HEP    Time 4   Period Months   Status On-going  still learning     PT LONG TERM GOAL #2   Title ability to contract anal spinter with lift at strength level 4/5 to reduce fecal incontinence >/= 70% after she eats   Time 4   Period Months   Status On-going     PT LONG TERM GOAL #3   Title penile penetration with intercourse pain decreased >/= 75% due to her ability to relax the tissue   Time 4   Period Months   Status On-going  has not had penile penetration     PT LONG TERM GOAL #4   Title understand moisterizers and lubricants to reduce vaginal dryness for improved vaginal health   Time 4   Period Months   Status Achieved  PT LONG TERM GOAL #5   Title FOTO score </= 40% limitation   Time 4   Period Months   Status On-going                Plan - 08/30/16 1015    Clinical Impression Statement Patient reports improved by 65%.  Her back pain is 50% better. Patient reports no pain after exercise. Patient needed verbal cues to brace abdominals with exercise.  Patient will benefit from skilled therapy to improve overall strength and reduce fecal incontinence.    Rehab Potential Excellent   Clinical Impairments Affecting Rehab Potential None   PT Frequency 1x / week   PT Duration Other (comment)  4 month   PT Next Visit Plan see how it went with gym exercise   PT Home Exercise Plan abdominal exercise   Consulted and Agree with Plan of Care Patient      Patient will benefit from skilled therapeutic intervention in order to improve the following deficits and impairments:  Decreased coordination, Decreased range of motion, Increased fascial restricitons, Increased muscle spasms, Decreased activity tolerance, Pain, Decreased strength, Decreased mobility  Visit Diagnosis: Muscle weakness (generalized)  Other muscle spasm  Unspecified lack of coordination     Problem List Patient Active Problem List   Diagnosis Date Noted  . Osteoporosis with current pathological fracture 06/19/2016  . Other secondary osteoarthritis of both hands 04/15/2016  . Osteoarthritis of pelvis 04/15/2016  . Degenerative disc disease, lumbar 04/15/2016  . Neuropathy ([place]) 04/15/2016  . IBS (irritable bowel syndrome) 04/15/2016  . Chronic diastolic heart failure ([place]) 06/14/2015  . Premature ventricular contractions 12/10/2013  . Fibromyalgia 10/03/2012  . Abdominal pain, epigastric 10/01/2012  . DIARRHEA, CHRONIC 10/26/2009  . COLONIC POLYPS, ADENOMATOUS 09/21/2007  . DM2 (diabetes mellitus, type 2) ([place]) 09/21/2007  . HLD (hyperlipidemia) 09/21/2007  . Anxiety state 09/21/2007  . DEPRESSION 09/21/2007  . GERD 09/21/2007  . HIATAL HERNIA 09/21/2007  . CHOLEDOCHOLITHIASIS, HX OF 09/21/2007  . Acute gastritis 07/30/2007    Earlie Counts,  PT 08/30/16 10:18 AM   [facility] [address] [place], [place], [address] Phone: [phone]   Fax:  [phone]  Name: Dorothy [name] MRN: [id number] Date of Birth: [birth year]

## 2016-09-06 ENCOUNTER — Encounter: Payer: Self-pay | Admitting: Physical Therapy

## 2016-09-06 ENCOUNTER — Ambulatory Visit: Payer: 59 | Admitting: Physical Therapy

## 2016-09-06 DIAGNOSIS — M62838 Other muscle spasm: Secondary | ICD-10-CM

## 2016-09-06 DIAGNOSIS — M6281 Muscle weakness (generalized): Secondary | ICD-10-CM

## 2016-09-06 DIAGNOSIS — R279 Unspecified lack of coordination: Secondary | ICD-10-CM

## 2016-09-06 NOTE — Patient Instructions (Addendum)
Curl-Up: Phase 1    With arms at sides, tilt pelvis to flatten back. Raise head and shoulders from floor. Use arms to support trunk if necessary. Keep chin upward.  Repeat _5-10___ times per set. Do __1__ sets per session. Do __1__ sessions per day.  http://orth.exer.us/136   Copyright  VHI. All rights reserved.  Diagonal Curl-Up: Phase 1    With arms at sides, tilt pelvis to flatten back. Raise head and shoulders, rotating to left side as shoulder blades clear floor. Keep chin up. Repeat __5-10__ times per set. Do __1__ sets per session. Do __1__ sessions per day.  http://orth.exer.us/138   Copyright  VHI. All rights reserved.  [facility] [address], [place], [place] [address] Phone # [phone] Fax [phone]

## 2016-09-06 NOTE — Therapy (Signed)
[facility] [address], [place], [place], [address] Phone: [phone]   Fax:  [phone]  Physical Therapy Treatment  Patient Details  Name: Dorothy [name] MRN: [id number] Date of Birth: 1956-03-01 Referring Provider: Dr. Harl Bowie  Encounter Date: 09/06/2016      PT End of Session - 09/06/16 1026    Visit Number 9   Date for PT Re-Evaluation 10/15/16   PT Start Time 0350   PT Stop Time 1053   PT Time Calculation (min) 38 min   Activity Tolerance Patient tolerated treatment well   Behavior During Therapy [facility] for tasks assessed/performed      Past Medical History:  Diagnosis Date  . Allergic rhinitis, cause unspecified   . Anxiety   . B12 deficiency   . Benign paroxysmal positional vertigo   . Choledocholithiasis   . Congestive heart failure ([place])   . Depression   . Diabetes mellitus   . Fatty liver   . Fatty liver 09/04/12  . Fibromyalgia   . Gastritis   . GERD (gastroesophageal reflux disease)   . Hematuria, unspecified   . Hiatal hernia   . Hx of adenomatous colonic polyps   . Hx of cardiovascular stress test    ETT-Myoview (12/15):  No ischemia.  No ECG changes.  EF 73%.  Low Risk.   [name] Hx of echocardiogram    Echo (12/15):  EF 60-65%, no RWMA, normal diast function, mild RVE  . Hyperlipidemia   . Meniere's disease, unspecified   . Migraine   . Mononeuritis of unspecified site   . Osteoarthritis   . Osteoporosis, unspecified   . Otosclerosis, unspecified   . Peptic ulcer, unspecified site, unspecified as acute or chronic, without mention of hemorrhage, perforation, or obstruction   . Symptomatic menopausal or female climacteric states   . Unspecified disorder of kidney and ureter     Past Surgical History:  Procedure Laterality Date  . BILE DUCT EXPLORATION     gallstone removed  . BREAST ENHANCEMENT SURGERY Bilateral   . BUNIONECTOMY Left 12/2013  . CHOLECYSTECTOMY    . FOOT  SURGERY     right  . 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 MOUTH SURGERY    . SHOULDER SURGERY     left  . TOTAL ABDOMINAL HYSTERECTOMY     pelvic pain/scar tissue; ovaries intact.  . TUBAL LIGATION      There were no vitals filed for this visit.      Subjective Assessment - 09/06/16 1019    Subjective I am 75% is better. I went to the gym monday and did three different machines and 12 minutes on the bicycle.    Patient Stated Goals be able to go out to eat and not have an accident   Currently in Pain? No/denies            [facility] PT Assessment - 09/06/16 0001      Assessment   Medical Diagnosis R19.7 Diarrhea, unspecified type; R15.9 fecal incontinence   Referring Provider Dr. Harl Bowie   Onset Date/Surgical Date 03/04/11   Prior Therapy None     Precautions   Precautions Other (comment)   Precaution Comments no joint mobilization     Restrictions   Weight Bearing Restrictions No  Balance Screen   Has the patient fallen in the past 6 months No   Has the patient had a decrease in activity level because of a fear of falling?  No   Is the patient reluctant to leave their home because of a fear of falling?  No     Home Ecologist residence     Prior Function   Level of Independence Independent   Vocation Full time employment   Technical brewer, more standing than sitting     Cognition   Overall Cognitive Status Within Functional Limits for tasks assessed     Observation/Other Assessments   Focus on Therapeutic Outcomes (FOTO)  24% limitation     Posture/Postural Control   Posture/Postural Control No significant limitations     AROM   Overall AROM Comments lumbar ROM is full                  Pelvic Floor  Special Questions - 09/06/16 0001    Currently Sexually Active Yes   Is this Painful No   Marinoff Scale no problems   Urinary Leakage Yes   Activities that cause leaking --  trampoline   Exam Type Deferred           [facility] Adult PT Treatment/Exercise - 09/06/16 0001      Exercises   Exercises Other Exercises   Other Exercises  reviewed gym program with proper technique on the equipment, correct posture to not strain shoulders and abdominal contraction      Lumbar Exercises: Supine   Ab Set 10 reps;5 seconds;15 reps   Other Supine Lumbar Exercises abdominal curl-up and diagonal curl-up 10x each with tactile cues to contract abdominals correctly.                 PT Education - 09/06/16 1100    Education provided Yes   Education Details exercise at the gym; abdominal exercise; abdominal bracing   Person(s) Educated Patient   Methods Explanation;Demonstration;Verbal cues;Handout   Comprehension Returned demonstration;Verbalized understanding          PT Short Term Goals - 07/12/16 1021      PT SHORT TERM GOAL #1   Title independent with initial HEP   Time 4   Period Weeks   Status Achieved     PT SHORT TERM GOAL #2   Title ability to contract the puborectalis so she is able to start to slow down fecal incontinence   Time 4   Period Weeks   Status Achieved     PT SHORT TERM GOAL #3   Title pain with intercourse decreased >/= 25%   Time 4   Period Weeks   Status Achieved     PT SHORT TERM GOAL #4   Title understand how to perform abdominal massage to assist in bowel movements and reduce trigger points   Time 4   Period Weeks   Status Achieved     PT SHORT TERM GOAL #5   Title understand how to perfrom self perineal massage to relax the pelvic floor muscles so they can contract correctly   Time 4   Period Weeks   Status Achieved           PT Long Term Goals - 09/06/16 1027      PT LONG TERM GOAL #1   Title independent with HEP    Time 4    Period Months   Status Achieved  PT LONG TERM GOAL #2   Title ability to contract anal spinter with lift at strength level 4/5 to reduce fecal incontinence >/= 70% after she eats   Time 4   Period Months   Status Achieved     PT LONG TERM GOAL #3   Title penile penetration with intercourse pain decreased >/= 75% due to her ability to relax the tissue   Time 4   Period Months   Status Achieved     PT LONG TERM GOAL #4   Title understand moisterizers and lubricants to reduce vaginal dryness for improved vaginal health   Time 4   Period Months   Status Achieved     PT LONG TERM GOAL #5   Title FOTO score </= 40% limitation   Time 4   Period Months   Status Achieved  24% limitation               Plan - 09/06/16 1026    Clinical Impression Statement Patient reports her pain is 75% better. Her fecal leakage is 80% better after she eats.  She will leak urine when she goes on the trampoline.  Patient understands how to contract abdominals correctly but can only do 3 times.  Patient understands how to exercise at the gym correctly.  Patient has not taken medications to assist her in toileting in 3 weeks. Patient has no pain with penile penetration. Patient understands how vaginal moistrizers and lubricants assist with vaginal health. Patient is ready for discharge and met all of her goals.    Rehab Potential Excellent   Clinical Impairments Affecting Rehab Potential None   PT Treatment/Interventions Biofeedback;Electrical Stimulation;Ultrasound;Moist Heat;Therapeutic activities;Therapeutic exercise;Neuromuscular re-education;Patient/family education;Passive range of motion;Manual techniques;Dry needling   PT Next Visit Plan Discharge this visit   PT Home Exercise Plan current HEP   Consulted and Agree with Plan of Care Patient      Patient will benefit from skilled therapeutic intervention in order to improve the following deficits and impairments:  Decreased coordination,  Decreased range of motion, Increased fascial restricitons, Increased muscle spasms, Decreased activity tolerance, Pain, Decreased strength, Decreased mobility  Visit Diagnosis: Muscle weakness (generalized)  Other muscle spasm  Unspecified lack of coordination     Problem List Patient Active Problem List   Diagnosis Date Noted  . Osteoporosis with current pathological fracture 06/19/2016  . Other secondary osteoarthritis of both hands 04/15/2016  . Osteoarthritis of pelvis 04/15/2016  . Degenerative disc disease, lumbar 04/15/2016  . Neuropathy (HCC) 04/15/2016  . IBS (irritable bowel syndrome) 04/15/2016  . Chronic diastolic heart failure (HCC) 06/14/2015  . Premature ventricular contractions 12/10/2013  . Fibromyalgia 10/03/2012  . Abdominal pain, epigastric 10/01/2012  . DIARRHEA, CHRONIC 10/26/2009  . COLONIC POLYPS, ADENOMATOUS 09/21/2007  . DM2 (diabetes mellitus, type 2) (HCC) 09/21/2007  . HLD (hyperlipidemia) 09/21/2007  . Anxiety state 09/21/2007  . DEPRESSION 09/21/2007  . GERD 09/21/2007  . HIATAL HERNIA 09/21/2007  . CHOLEDOCHOLITHIASIS, HX OF 09/21/2007  . Acute gastritis 07/30/2007    Eulis Foster, PT 09/06/16 11:07 AM   [facility] [address] [place], [place], [address] Phone: [phone]   Fax:  [phone]  Name: Dorothy [name] MRN: [id number] Date of Birth: 1956-03-07  PHYSICAL THERAPY DISCHARGE SUMMARY  Visits from Start of Care: 9  Current functional level related to goals / functional outcomes: See above. All goals met.    Remaining deficits: See above   Education / Equipment: HEP  Plan: Patient agrees to discharge.  Patient goals were met. Patient is being discharged due to meeting the stated rehab goals. Thank you for the referral. Earlie Counts, PT 09/06/16 11:07 AM   ?????

## 2016-09-08 ENCOUNTER — Other Ambulatory Visit: Payer: Self-pay | Admitting: Rheumatology

## 2016-09-08 NOTE — Telephone Encounter (Signed)
Last Visit: 06/19/16 Next Visit: 09/20/16 Labs: 07/26/16 WNL  Okay to refill Fosamax?

## 2016-09-13 ENCOUNTER — Encounter: Payer: 59 | Admitting: Physical Therapy

## 2016-09-13 ENCOUNTER — Ambulatory Visit: Payer: 59

## 2016-09-17 ENCOUNTER — Other Ambulatory Visit: Payer: Self-pay | Admitting: Family Medicine

## 2016-09-17 NOTE — Telephone Encounter (Signed)
Last ov and labs 06/2016 

## 2016-09-20 ENCOUNTER — Ambulatory Visit (INDEPENDENT_AMBULATORY_CARE_PROVIDER_SITE_OTHER): Payer: 59 | Admitting: Rheumatology

## 2016-09-20 ENCOUNTER — Encounter: Payer: 59 | Admitting: Physical Therapy

## 2016-09-20 ENCOUNTER — Encounter: Payer: Self-pay | Admitting: Rheumatology

## 2016-09-20 VITALS — BP 120/80 | HR 78 | Resp 14 | Ht 65.0 in | Wt 175.0 lb

## 2016-09-20 DIAGNOSIS — M1610 Unilateral primary osteoarthritis, unspecified hip: Secondary | ICD-10-CM | POA: Diagnosis not present

## 2016-09-20 DIAGNOSIS — M5030 Other cervical disc degeneration, unspecified cervical region: Secondary | ICD-10-CM

## 2016-09-20 DIAGNOSIS — M5136 Other intervertebral disc degeneration, lumbar region: Secondary | ICD-10-CM | POA: Diagnosis not present

## 2016-09-20 DIAGNOSIS — Z8639 Personal history of other endocrine, nutritional and metabolic disease: Secondary | ICD-10-CM

## 2016-09-20 DIAGNOSIS — M8000XS Age-related osteoporosis with current pathological fracture, unspecified site, sequela: Secondary | ICD-10-CM

## 2016-09-20 DIAGNOSIS — K588 Other irritable bowel syndrome: Secondary | ICD-10-CM | POA: Diagnosis not present

## 2016-09-20 DIAGNOSIS — M797 Fibromyalgia: Secondary | ICD-10-CM | POA: Diagnosis not present

## 2016-09-20 MED ORDER — LIDOCAINE HCL 1 % IJ SOLN
1.0000 mL | INTRAMUSCULAR | Status: AC | PRN
  Administered 2016-09-20: 1 mL

## 2016-09-20 MED ORDER — GABAPENTIN 300 MG PO CAPS
300.0000 mg | ORAL_CAPSULE | Freq: Three times a day (TID) | ORAL | 3 refills | Status: DC
Start: 2016-09-20 — End: 2017-02-21

## 2016-09-20 MED ORDER — PREDNISONE 5 MG PO TABS: ORAL_TABLET | ORAL | 0 refills | Status: DC

## 2016-09-20 MED ORDER — OMEGA-3-ACID ETHYL ESTERS 1 G PO CAPS: 1.0000 g | ORAL_CAPSULE | Freq: Two times a day (BID) | ORAL | 9 refills | Status: DC

## 2016-09-20 MED ORDER — TRIAMCINOLONE ACETONIDE 40 MG/ML IJ SUSP
40.0000 mg | INTRAMUSCULAR | Status: AC | PRN
  Administered 2016-09-20: 40 mg via INTRA_ARTICULAR

## 2016-09-20 NOTE — Progress Notes (Signed)
Office Visit Note  Patient: Dorothy [name]             Date of Birth: [birth year]           MRN: [id number]             PCP: Reginia Forts, MD Referring: [name], MD Visit Date: 09/20/2016 Occupation: @GUAROCC @    Subjective:  Pain of the Lower Back and Follow-up   History of Present Illness: Dorothy [name] is a 61 y.o. female with history of fibromyalgia, disc disease and osteoarthritis. She states she's been having lower back pain for a while. She had to drive quite a lot in December which flared her lower back. Last Saturday she did a class where she had to sit down for about 8 hours which caused exacerbation of her lower back pain. The pain is radiating into her right lower extremity. There is not much discomfort in her neck.  Activities of Daily Living:  Patient reports morning stiffness for 30 minutes.   Patient Denies nocturnal pain.  Difficulty dressing/grooming: Denies Difficulty climbing stairs: Reports Difficulty getting out of chair: Reports Difficulty using hands for taps, buttons, cutlery, and/or writing: Reports   Review of Systems  Constitutional: Negative for fatigue, night sweats, weight gain, weight loss and weakness.  HENT: Negative for mouth sores, trouble swallowing, trouble swallowing, mouth dryness and nose dryness.   Eyes: Positive for dryness. Negative for pain, redness and visual disturbance.  Respiratory: Negative for cough, shortness of breath and difficulty breathing.   Cardiovascular: Negative for chest pain, palpitations, hypertension, irregular heartbeat and swelling in legs/feet.  Gastrointestinal: Negative for blood in stool, constipation and diarrhea.  Endocrine: Negative for increased urination.  Genitourinary: Negative for vaginal dryness.  Musculoskeletal: Positive for arthralgias, joint pain, myalgias, morning stiffness and myalgias. Negative for joint swelling, muscle weakness and muscle tenderness.  Skin: Negative for color  change, rash, hair loss, skin tightness, ulcers and sensitivity to sunlight.  Allergic/Immunologic: Negative for susceptible to infections.  Neurological: Negative for dizziness, memory loss and night sweats.  Hematological: Negative for swollen glands.  Psychiatric/Behavioral: Positive for sleep disturbance. Negative for depressed mood. The patient is not nervous/anxious.     PMFS History:  Patient Active Problem List   Diagnosis Date Noted  . History of diabetes mellitus 09/20/2016  . Degenerative disc disease, cervical 09/20/2016  . Osteoporosis with current pathological fracture 06/19/2016  . Other secondary osteoarthritis of both hands 04/15/2016  . Osteoarthritis of pelvis 04/15/2016  . DDD (degenerative disc disease), lumbar 04/15/2016  . Neuropathy ([place]) 04/15/2016  . IBS (irritable bowel syndrome) 04/15/2016  . Chronic diastolic heart failure ([place]) 06/14/2015  . Premature ventricular contractions 12/10/2013  . Fibromyalgia 10/03/2012  . Abdominal pain, epigastric 10/01/2012  . DIARRHEA, CHRONIC 10/26/2009  . COLONIC POLYPS, ADENOMATOUS 09/21/2007  . DM2 (diabetes mellitus, type 2) ([place]) 09/21/2007  . HLD (hyperlipidemia) 09/21/2007  . Anxiety state 09/21/2007  . DEPRESSION 09/21/2007  . GERD 09/21/2007  . HIATAL HERNIA 09/21/2007  . CHOLEDOCHOLITHIASIS, HX OF 09/21/2007  . Acute gastritis 07/30/2007    Past Medical History:  Diagnosis Date  . Allergic rhinitis, cause unspecified   . Anxiety   . B12 deficiency   . Benign paroxysmal positional vertigo   . Choledocholithiasis   . Congestive heart failure ([place])   . Depression   . Diabetes mellitus   . Fatty liver   . Fatty liver 09/04/12  . Fibromyalgia   . Gastritis   . GERD (gastroesophageal  reflux disease)   . Hematuria, unspecified   . Hiatal hernia   . Hx of adenomatous colonic polyps   . Hx of cardiovascular stress test    ETT-Myoview (12/15):  No ischemia.  No ECG changes.  EF 73%.  Low Risk.   [name] Hx of  echocardiogram    Echo (12/15):  EF 60-65%, no RWMA, normal diast function, mild RVE  . Hyperlipidemia   . Meniere's disease, unspecified   . Migraine   . Mononeuritis of unspecified site   . Osteoarthritis   . Osteoporosis, unspecified   . Otosclerosis, unspecified   . Peptic ulcer, unspecified site, unspecified as acute or chronic, without mention of hemorrhage, perforation, or obstruction   . Symptomatic menopausal or female climacteric states   . Unspecified disorder of kidney and ureter     Family History  Problem Relation Age of Onset  . Prostate cancer Father   . Lung disease Father   . Diabetes Father   . Heart disease Father 14    CHF, AMI multiple/CABG  . COPD Father   . Heart attack Father   . Cancer Father     testicular cancer  . Diabetes Mother   . Hypertension Mother   . Hyperlipidemia Mother   . COPD Mother   . Cancer Mother     lung cancer  . Diabetes Brother   . Hypertension Brother   . Obesity Brother   . Hyperlipidemia Brother   . COPD Brother   . Stroke Maternal Grandmother   . Heart disease Maternal Grandfather   . COPD Paternal Grandmother   . Diabetes Paternal Grandmother   . COPD Paternal Grandfather   . Heart attack Paternal Grandfather   . Colon cancer Cousin     died age 75  . Breast cancer Maternal Aunt    Past Surgical History:  Procedure Laterality Date  . BILE DUCT EXPLORATION     gallstone removed  . BREAST ENHANCEMENT SURGERY Bilateral   . BUNIONECTOMY Left 12/2013  . CHOLECYSTECTOMY    . FOOT SURGERY     right  . 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 MOUTH SURGERY    . SHOULDER SURGERY     left  . TOTAL ABDOMINAL HYSTERECTOMY     pelvic pain/scar tissue; ovaries intact.  . TUBAL LIGATION     Social History   Social History Narrative        Marital status:  Married x 21 years; happily married,no abuse. 2nd marriage.      Children: 3 sons (41, 81, 37)---2 sons with substance abuse; 3 grandchildren; 3 step grandchildren; 2 gg.      Lives: with husband, son/Brian.      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      Drugs:none      Exercise: exercising at gym in 2017; 2 days per week; walking and weight lifting.      Seatbelt: 100%; no texting      Guns: none      Always uses seat belts. Smoke alarm and carbon monoxide detector in the home.No caffeine use. No unsecured guns in the home.      Objective: Vital Signs: BP 120/80  Pulse 78   Resp 14   Ht 5\' 5"  (1.651 m)   Wt 175 lb (79.4 kg)   BMI 29.12 kg/m    Physical Exam  Constitutional: She is oriented to person, place, and time. She appears well-developed and well-nourished.  HENT:  Head: Normocephalic and atraumatic.  Eyes: Conjunctivae and EOM are normal.  Neck: Normal range of motion.  Cardiovascular: Normal rate, regular rhythm, normal heart sounds and intact distal pulses.   Pulmonary/Chest: Effort normal and breath sounds normal.  Abdominal: Soft. Bowel sounds are normal.  Lymphadenopathy:    She has no cervical adenopathy.  Neurological: She is alert and oriented to person, place, and time.  Skin: Skin is warm and dry. Capillary refill takes less than 2 seconds.  Psychiatric: She has a normal mood and affect. Her behavior is normal.  Nursing note and vitals reviewed.    Musculoskeletal Exam: C-spine, thoracic, lumbar spine good range of motion she has discomfort with range of motion of her lumbar spine she had tenderness on palpation over right SI joint. Shoulder joints, elbow joints, wrist joints, MCPs with good range of motion with no swelling she has PIP/DIP thickening bilaterally in her hands. Hip joints knee joints ankles MTPs PIPs DIPs with good range of motion  with no synovitis.  CDAI Exam: No CDAI exam completed.    Investigation: Findings:  01/19/2016 Ms. Martorana is here to get ultrasound examination of bilateral hands.  She has been discomfort in her hands.   PROCEDURE:  After informed consent was obtained per EULAR recommendation, ultrasound examination of the bilateral hands was performed.  Using 12 MHz transducer, grayscale and power Doppler, bilateral 2nd, 3rd and 5th MCP joints and bilateral wrist joints both dorsal and volar aspects were evaluated.  There was no synovitis or tenosynovitis on examination.  The right median nerve was 0.8 cm square which was within normal limits and left median nerve was 0.9 cm square which was also within normal limits.     01/04/2016  We also had her recent labs which were reviewed with the patient which include iron studies, magnesium, sed rate, uric acid, rheumatoid factor, CCP, 14-3-3 etc and ANA.    Her labs from December of 2008 showed CBC, comprehensive metabolic panel, sed rate, CK, TSH, rheumatoid factor, ANA, serum protein electrophoresis, CCP antibody, and urinalysis were all within normal limits.  Her vitamin D was low.      Imaging: No results found.  Speciality Comments: No specialty comments available.    Procedures:  Large Joint Inj Date/Time: 09/20/2016 11:52 AM Performed by: Bo Merino Authorized by: Bo Merino   Consent Given by:  Patient Site marked: the procedure site was marked   Timeout: prior to procedure the correct patient, procedure, and site was verified   Indications:  Pain Location:  Sacroiliac Site:  R sacroiliac joint Prep: patient was prepped and draped in usual sterile fashion   Needle Size:  27 G Needle Length:  1.5 inches Approach:  Superior Ultrasound Guidance: No   Fluoroscopic Guidance: No   Arthrogram: No   Medications:  1 mL lidocaine 1 %; 40 mg triamcinolone acetonide 40 MG/ML Aspiration Attempted: Yes   Aspirate amount (mL):   0 Patient tolerance:  Patient tolerated the procedure well with no immediate complications   Allergies: Bactrim [sulfamethoxazole-trimethoprim]; Keflex [cephalexin]; Macrobid [nitrofurantoin macrocrystal]; Nitrofurantoin; Sulfa drugs cross reactors; Adhesive [tape]; Codeine; and Talwin [pentazocine]   Assessment / Plan:     Visit Diagnoses: Fibromyalgia: She has generalized  pain and discomfort and positive tender points. She wants to switch her Lyrica to gabapentin as her insurance changing. I've advised her to discontinue Lyrica and I will give her gabapentin 300 mg by mouth 3 times a day 30 day supply with 3 refills.  DDD (degenerative disc disease), lumbar: She's been having increased lower back pain especially after recent travel and prolonged sitting.  Right SI joint pain: She is been having severe SI joint pain had responded well to cortisone injections in the past per her request I am right SI joint was prepped in sterile fashion injected with cortisone and as described above she tolerated the procedure well. She also request a prednisone taper she is diabetic I'll can go to give her low-dose prednisone. She'll be given prednisone 5 mg tablets 4 tablets for 2 days, 3 for 2 days, 2 for 2 days and 1 for 2 days. I've advised her to monitor her blood sugar very closely.  Degenerative disc disease, cervical: Minimal stiffness  Osteoarthritis of pelvis: Increased pain  Per request a prescription refill for Lovaza will be given.  She has following medical problems for which she's been seen by the doctors.  Osteoporosis with current pathological fracture, unspecified osteoporosis type, sequela  Other irritable bowel syndrome  History of diabetes mellitus    Orders: No orders of the defined types were placed in this encounter.  No orders of the defined types were placed in this encounter.   Face-to-face time spent with patient was 30 minutes. 50% of time was spent in counseling and  coordination of care.  Follow-Up Instructions: No Follow-up on file.   Bo Merino, MD  Note - This record has been created using Editor, commissioning.  Chart creation errors have been sought, but may not always  have been located. Such creation errors do not reflect on  the standard of medical care.

## 2016-09-27 ENCOUNTER — Ambulatory Visit
Admission: RE | Admit: 2016-09-27 | Discharge: 2016-09-27 | Disposition: A | Discharge status: Home / Self Care | Payer: 59 | Source: Ambulatory Visit

## 2016-09-27 ENCOUNTER — Encounter: Payer: 59 | Admitting: Physical Therapy

## 2016-09-27 DIAGNOSIS — Z1231 Encounter for screening mammogram for malignant neoplasm of breast: Secondary | ICD-10-CM

## 2016-09-30 ENCOUNTER — Other Ambulatory Visit: Payer: Self-pay | Admitting: Gastroenterology

## 2016-10-02 ENCOUNTER — Other Ambulatory Visit: Payer: Self-pay | Admitting: Family Medicine

## 2016-10-02 DIAGNOSIS — Z1231 Encounter for screening mammogram for malignant neoplasm of breast: Secondary | ICD-10-CM

## 2016-10-10 ENCOUNTER — Telehealth: Payer: Self-pay | Admitting: Pharmacist

## 2016-10-10 NOTE — Telephone Encounter (Signed)
Received fax from patient's insurance company regarding possible therapeutic duplication.  "Our records indicate that your patient has received the following two medications: gabapentin and Lyrica concurrently based on the records at the dispensing pharmacy."  Reviewed patient's chart.  She was last seen on 09/20/16 at which time "She wants to switch her Lyrica to gabapentin as her insurance changing. I've advised her to discontinue Lyrica and I will give her gabapentin 300 mg by mouth 3 times a day 30 day supply with 3 refills."  I called patient to confirm that she is no longer taking Lyrica.  I left a message on her machine asking her to call me back.

## 2016-10-11 NOTE — Telephone Encounter (Signed)
I spoke to patient who confirmed she is no longer taking Lyrica.  Will send this information to patient's insurance company.    Elisabeth Most, Pharm.D., BCPS, CPP Clinical Pharmacist Pager: [phone] Phone: [phone] 10/11/2016 8:45 AM

## 2016-10-25 ENCOUNTER — Encounter: Payer: Self-pay | Admitting: Family Medicine

## 2016-10-25 ENCOUNTER — Ambulatory Visit (INDEPENDENT_AMBULATORY_CARE_PROVIDER_SITE_OTHER): Payer: 59 | Admitting: Family Medicine

## 2016-10-25 VITALS — BP 117/79 | HR 67 | Temp 98.1°F | Resp 16 | Ht 65.0 in | Wt 170.4 lb

## 2016-10-25 DIAGNOSIS — R0789 Other chest pain: Secondary | ICD-10-CM

## 2016-10-25 DIAGNOSIS — F411 Generalized anxiety disorder: Secondary | ICD-10-CM | POA: Diagnosis not present

## 2016-10-25 DIAGNOSIS — K219 Gastro-esophageal reflux disease without esophagitis: Secondary | ICD-10-CM

## 2016-10-25 DIAGNOSIS — E7800 Pure hypercholesterolemia, unspecified: Secondary | ICD-10-CM | POA: Diagnosis not present

## 2016-10-25 DIAGNOSIS — M797 Fibromyalgia: Secondary | ICD-10-CM

## 2016-10-25 DIAGNOSIS — K580 Irritable bowel syndrome with diarrhea: Secondary | ICD-10-CM | POA: Diagnosis not present

## 2016-10-25 DIAGNOSIS — M5030 Other cervical disc degeneration, unspecified cervical region: Secondary | ICD-10-CM | POA: Diagnosis not present

## 2016-10-25 DIAGNOSIS — Z794 Long term (current) use of insulin: Secondary | ICD-10-CM | POA: Diagnosis not present

## 2016-10-25 DIAGNOSIS — E1121 Type 2 diabetes mellitus with diabetic nephropathy: Secondary | ICD-10-CM

## 2016-10-25 LAB — POCT URINALYSIS DIP (MANUAL ENTRY)
BILIRUBIN UA: NEGATIVE
GLUCOSE UA: NEGATIVE
Ketones, POC UA: NEGATIVE
LEUKOCYTES UA: NEGATIVE
NITRITE UA: NEGATIVE
RBC UA: NEGATIVE
Spec Grav, UA: 1.025
UROBILINOGEN UA: 0.2
pH, UA: 6

## 2016-10-25 LAB — POCT GLYCOSYLATED HEMOGLOBIN (HGB A1C): HEMOGLOBIN A1C: 7.1

## 2016-10-25 LAB — GLUCOSE, POCT (MANUAL RESULT ENTRY): POC GLUCOSE: 149 mg/dL — AB (ref 70–99)

## 2016-10-25 NOTE — Patient Instructions (Addendum)
   IF you received an x-ray today, you will receive an invoice from [facility]. Please contact [facility] at [phone] with questions or concerns regarding your invoice.   IF you received labwork today, you will receive an invoice from [facility]. Please contact [facility] at [phone] with questions or concerns regarding your invoice.   Our billing staff will not be able to assist you with questions regarding bills from these companies.  You will be contacted with the lab results as soon as they are available. The fastest way to get your results is to activate your My Chart account. Instructions are located on the last page of this paperwork. If you have not heard from us regarding the results in 2 weeks, please contact this office.      Fat and Cholesterol Restricted Diet Getting too much fat and cholesterol in your diet may cause health problems. Following this diet helps keep your fat and cholesterol at normal levels. This can keep you from getting sick. What types of fat should I choose?  Choose monosaturated and polyunsaturated fats. These are found in foods such as olive oil, canola oil, flaxseeds, walnuts, almonds, and seeds.  Eat more omega-3 fats. Good choices include salmon, mackerel, sardines, tuna, flaxseed oil, and ground flaxseeds.  Limit saturated fats. These are in animal products such as meats, butter, and cream. They can also be in plant products such as palm oil, palm kernel oil, and coconut oil.  Avoid foods with partially hydrogenated oils in them. These contain trans fats. Examples of foods that have trans fats are stick margarine, some tub margarines, cookies, crackers, and other baked goods. What general guidelines do I need to follow?  Check food labels. Look for the words "trans fat" and "saturated fat."  When preparing a meal:  Fill half of your plate with vegetables and green salads.  Fill one fourth of your plate with whole  grains. Look for the word "whole" as the first word in the ingredient list.  Fill one fourth of your plate with lean protein foods.  Eat more foods that have fiber, like apples, carrots, beans, peas, and barley.  Eat more home-cooked foods. Eat less at restaurants and buffets.  Limit or avoid alcohol.  Limit foods high in starch and sugar.  Limit fried foods.  Cook foods without frying them. Baking, boiling, grilling, and broiling are all great options.  Lose weight if you are overweight. Losing even a small amount of weight can help your overall health. It can also help prevent diseases such as diabetes and heart disease. What foods can I eat? Grains  Whole grains, such as whole wheat or whole grain breads, crackers, cereals, and pasta. Unsweetened oatmeal, bulgur, barley, quinoa, or brown rice. Corn or whole wheat flour tortillas. Vegetables  Fresh or frozen vegetables (raw, steamed, roasted, or grilled). Green salads. Fruits  All fresh, canned (in natural juice), or frozen fruits. Meat and Other Protein Products  Ground beef (85% or leaner), grass-fed beef, or beef trimmed of fat. Skinless chicken or turkey. Ground chicken or turkey. Pork trimmed of fat. All fish and seafood. Eggs. Dried beans, peas, or lentils. Unsalted nuts or seeds. Unsalted canned or dry beans. Dairy  Low-fat dairy products, such as skim or 1% milk, 2% or reduced-fat cheeses, low-fat ricotta or cottage cheese, or plain low-fat yogurt. Fats and Oils  Tub margarines without trans fats. Light or reduced-fat mayonnaise and salad dressings. Avocado. Olive, canola, sesame, or safflower oils. Natural peanut   or almond butter (choose ones without added sugar and oil). The items listed above may not be a complete list of recommended foods or beverages. Contact your dietitian for more options.  What foods are not recommended? Grains  White bread. White pasta. White rice. Cornbread. Bagels, pastries, and croissants.  Crackers that contain trans fat. Vegetables  White potatoes. Corn. Creamed or fried vegetables. Vegetables in a cheese sauce. Fruits  Dried fruits. Canned fruit in light or heavy syrup. Fruit juice. Meat and Other Protein Products  Fatty cuts of meat. Ribs, chicken wings, bacon, sausage, bologna, salami, chitterlings, fatback, hot dogs, bratwurst, and packaged luncheon meats. Liver and organ meats. Dairy  Whole or 2% milk, cream, half-and-half, and cream cheese. Whole milk cheeses. Whole-fat or sweetened yogurt. Full-fat cheeses. Nondairy creamers and whipped toppings. Processed cheese, cheese spreads, or cheese curds. Sweets and Desserts  Corn syrup, sugars, honey, and molasses. Candy. Jam and jelly. Syrup. Sweetened cereals. Cookies, pies, cakes, donuts, muffins, and ice cream. Fats and Oils  Butter, stick margarine, lard, shortening, ghee, or bacon fat. Coconut, palm kernel, or palm oils. Beverages  Alcohol. Sweetened drinks (such as sodas, lemonade, and fruit drinks or punches). The items listed above may not be a complete list of foods and beverages to avoid. Contact your dietitian for more information.  This information is not intended to replace advice given to you by your health care provider. Make sure you discuss any questions you have with your health care provider. Document Released: 02/13/2012 Document Revised: 04/20/2016 Document Reviewed: 11/13/2013 Elsevier Interactive Patient Education  2017 Elsevier Inc.  

## 2016-10-25 NOTE — Progress Notes (Signed)
Subjective:    Patient ID: Dorothy [name], female    DOB: 1956-07-30, 61 y.o.   MRN: [id number]  10/25/2016  follow up (3 month follow up) and Diabetes   HPI This 61 y.o. female presents for evaluation of DMII, hypercholesterolemia, anxiety/depression.  L trapezius pain/strain chronic issue.  Went to [facility] over the weekend.  Very busy weekend; going back and forth.  L anterior trapezius strain and L facial pain.  Not getting message.  Taking muscle relaxers.  Never checking sugars.   Taking Gabapentin bid not tid; was taking Lyrica which became too expensive.  Son is still on Methadone and still vomiting blood daily. Merry Proud youngest son is heroine free; having seizures.  Drinking a little bit.    Chest pain: having intermittent chest pain in evenings at rest; not sure if muscular and related to recent neck pain or due to GERD. No exertional component; no associated SOB or diaphoresis.   Immunization History  Administered Date(s) Administered  . Hepatitis B 12/14/2011, 06/11/2012, 01/07/2013  . Influenza Split 06/11/2012  . Influenza,inj,Quad PF,36+ Mos 07/28/2013, 07/15/2014, 06/14/2015, 07/26/2016  . Pneumococcal Polysaccharide-23 06/14/2015  . Pneumococcal-Unspecified 08/29/2007  . Tdap 08/28/2008   BP Readings from Last 3 Encounters:  10/25/16 117/79  09/20/16 120/80  08/23/16 134/83   Wt Readings from Last 3 Encounters:  10/25/16 170 lb 6.4 oz (77.3 kg)  09/20/16 175 lb (79.4 kg)  08/23/16 171 lb (77.6 kg)     Review of Systems  Constitutional: Negative for chills, diaphoresis, fatigue and fever.  Eyes: Negative for visual disturbance.  Respiratory: Negative for cough and shortness of breath.   Cardiovascular: Negative for chest pain, palpitations and leg swelling.  Gastrointestinal: Negative for abdominal pain, constipation, diarrhea, nausea and vomiting.  Endocrine: Negative for cold intolerance, heat intolerance, polydipsia, polyphagia and polyuria.  Musculoskeletal:  Positive for myalgias, neck pain and neck stiffness.  Neurological: Negative for dizziness, tremors, seizures, syncope, facial asymmetry, speech difficulty, weakness, light-headedness, numbness and headaches.  Psychiatric/Behavioral: Positive for dysphoric mood. Negative for self-injury and sleep disturbance. The patient is nervous/anxious.     Past Medical History:  Diagnosis Date  . Allergic rhinitis, cause unspecified   . Anxiety   . B12 deficiency   . Benign paroxysmal positional vertigo   . Choledocholithiasis   . Congestive heart failure ([place])   . Depression   . Diabetes mellitus   . Fatty liver   . Fatty liver 09/04/12  . Fibromyalgia   . Gastritis   . GERD (gastroesophageal reflux disease)   . Hematuria, unspecified   . Hiatal hernia   . Hx of adenomatous colonic polyps   . Hx of cardiovascular stress test    ETT-Myoview (12/15):  No ischemia.  No ECG changes.  EF 73%.  Low Risk.   [name] Hx of echocardiogram    Echo (12/15):  EF 60-65%, no RWMA, normal diast function, mild RVE  . Hyperlipidemia   . Meniere's disease, unspecified   . Migraine   . Mononeuritis of unspecified site   . Osteoarthritis   . Osteoporosis, unspecified   . Otosclerosis, unspecified   . Peptic ulcer, unspecified site, unspecified as acute or chronic, without mention of hemorrhage, perforation, or obstruction   . Symptomatic menopausal or female climacteric states   . Unspecified disorder of kidney and ureter    Past Surgical History:  Procedure Laterality Date  . BILE DUCT EXPLORATION     gallstone removed  . BREAST ENHANCEMENT SURGERY Bilateral   .  BUNIONECTOMY Left 12/2013  . CHOLECYSTECTOMY    . FOOT SURGERY     right  . 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 MOUTH SURGERY    . SHOULDER SURGERY     left  .  TOTAL ABDOMINAL HYSTERECTOMY     pelvic pain/scar tissue; ovaries intact.  . TUBAL LIGATION     Allergies  Allergen Reactions  . Bactrim [Sulfamethoxazole-Trimethoprim] Other (See Comments)    blisters  . Keflex [Cephalexin] Hives  . Macrobid [Nitrofurantoin Macrocrystal] Other (See Comments)    blisters  . Nitrofurantoin Nausea And Vomiting and Other (See Comments)    Blisters   . Sulfa Drugs Cross Reactors Itching  . Adhesive [Tape] Other (See Comments)    Tears skins  . Codeine Itching  . Talwin [Pentazocine] Nausea And Vomiting and Rash    Muscle cramps and vision changes   Current Outpatient Prescriptions  Medication Sig Dispense Refill  . alendronate (FOSAMAX) 70 MG tablet TAKE ONE TABLET BY MOUTH ONCE WEEKLY 4 tablet 1  . aspirin 81 MG tablet Take 81 mg by mouth every evening.    . Calcium Carb-Cholecalciferol (CALCIUM 500 +D) 500-400 MG-UNIT TABS Take 1 tablet by mouth daily.     . Cholecalciferol (VITAMIN D3) 2000 UNITS TABS Take 2,000 Units by mouth daily.     [name] DEXILANT 60 MG capsule TAKE ONE CAPSULE BY MOUTH DAILY 30 capsule 11  . fluticasone (FLONASE) 50 MCG/ACT nasal spray Place 2 sprays into both nostrils daily. 16 g 11  . gabapentin (NEURONTIN) 300 MG capsule Take 1 capsule (300 mg total) by mouth 3 (three) times daily. 90 capsule 3  . hydrochlorothiazide (HYDRODIURIL) 25 MG tablet Take 1 tablet (25 mg total) by mouth daily. 90 tablet 1  . ipratropium (ATROVENT) 0.03 % nasal spray Place 2 sprays into the nose 2 (two) times daily. 30 mL 0  . losartan (COZAAR) 50 MG tablet TAKE 1 TABLET (50 MG TOTAL) BY MOUTH DAILY. 90 tablet 3  . metaxalone (SKELAXIN) 800 MG tablet Take 800 mg by mouth 3 (three) times daily.    . metFORMIN (GLUCOPHAGE) 1000 MG tablet TAKE 1 TABLET (1,000 MG TOTAL) BY MOUTH 2 (TWO) TIMES DAILY WITH A MEAL 180 tablet 0  . omega-3 acid ethyl esters (LOVAZA) 1 g capsule Take 1 capsule (1 g total) by mouth 2 (two) times daily. 60 capsule 9  . Potassium  Chloride ER 20 MEQ TBCR Take 2 tablets (40 meQ) by mouth daily 60 tablet 11  . pramipexole (MIRAPEX) 0.125 MG tablet Take 2 tablets (0.25 mg total) by mouth at bedtime. 180 tablet 3  . PREVALITE 4 g packet TAKE 1 PACKET (4G TOTAL) BY MOUTH TWO TIMES A DAY 60 packet 2  . simvastatin (ZOCOR) 40 MG tablet Take 1 tablet (40 mg total) by mouth at bedtime. 90 tablet 3  . sitaGLIPtin (JANUVIA) 100 MG tablet Take 1 tablet (100 mg total) by mouth daily. 90 tablet 3  . venlafaxine XR (EFFEXOR-XR) 75 MG 24 hr capsule Take 1 capsule (75 mg total) by mouth every evening. 90 capsule 3  . VOLTAREN 1 % GEL Apply 2 g topically 2 (two) times daily as needed (pain).      No current facility-administered medications for this visit.    Social History   Social History  .  Marital status: Married    Spouse name: Richardson Landry  . Number of children: 3  . Years of education: 10th grade   Occupational History  .  [employer]   Social History Main Topics  . Smoking status: Never Smoker  . Smokeless tobacco: Never Used  . Alcohol use 0.0 oz/week     Comment: occasional 1 x month  . Drug use: No  . Sexual activity: Yes    Birth control/ protection: Post-menopausal, Surgical   Other Topics Concern  . Not on file   Social History Narrative      Marital status:  Married x 21 years; happily married,no abuse. 2nd marriage.      Children: 3 sons (41, 69, 37)---2 sons with substance abuse; 3 grandchildren; 3 step grandchildren; 2 gg.      Lives: with husband, son/Brian.      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      Drugs:none      Exercise: exercising at gym in 2017; 2 days per week; walking and weight lifting.      Seatbelt: 100%; no texting      Guns: none      Always uses seat belts. Smoke alarm and carbon monoxide detector in the home.No caffeine use. No unsecured guns in the home.    Family History    Problem Relation Age of Onset  . Prostate cancer Father   . Lung disease Father   . Diabetes Father   . Heart disease Father 63    CHF, AMI multiple/CABG  . COPD Father   . Heart attack Father   . Cancer Father     testicular cancer  . Diabetes Mother   . Hypertension Mother   . Hyperlipidemia Mother   . COPD Mother   . Cancer Mother     lung cancer  . Diabetes Brother   . Hypertension Brother   . Obesity Brother   . Hyperlipidemia Brother   . COPD Brother   . Stroke Maternal Grandmother   . Heart disease Maternal Grandfather   . COPD Paternal Grandmother   . Diabetes Paternal Grandmother   . COPD Paternal Grandfather   . Heart attack Paternal Grandfather   . Colon cancer Cousin     died age 47  . Breast cancer Maternal Aunt        Objective:    BP 117/79   Pulse 67   Temp 98.1 F (36.7 C) (Oral)   Resp 16   Ht 5\' 5"  (1.651 m)   Wt 170 lb 6.4 oz (77.3 kg)   SpO2 98%   BMI 28.36 kg/m  Physical Exam  Constitutional: She is oriented to person, place, and time. She appears well-developed and well-nourished. No distress.  HENT:  Head: Normocephalic and atraumatic.  Right Ear: External ear normal.  Left Ear: External ear normal.  Nose: Nose normal.  Mouth/Throat: Oropharynx is clear and moist.  Eyes: Conjunctivae and EOM are normal. Pupils are equal, round, and reactive to light.  Neck: Normal range of motion. Neck supple. Carotid bruit is not present. No thyromegaly present.  Cardiovascular: Normal rate, regular rhythm, normal heart sounds and intact distal pulses.  Exam reveals no gallop and no friction rub.   No murmur heard. Pulmonary/Chest: Effort normal and breath sounds normal. She has no wheezes. She has no rales.  Abdominal: Soft. Bowel sounds are normal. She exhibits no distension and no mass. There is no tenderness. There is no rebound and no guarding.  Musculoskeletal:       Cervical back: She exhibits decreased range of motion, tenderness, pain and  spasm. She exhibits no bony tenderness and normal pulse.  Lymphadenopathy:    She has no cervical adenopathy.  Neurological: She is alert and oriented to person, place, and time. No cranial nerve deficit.  Skin: Skin is warm and dry. No rash noted. She is not diaphoretic. No erythema. No pallor.  Psychiatric: She has a normal mood and affect. Her behavior is normal.        Assessment & Plan:   1. Type 2 diabetes mellitus with diabetic nephropathy, with long-term current use of insulin (HCC)   2. Other chest pain   3. Pure hypercholesterolemia   4. Gastroesophageal reflux disease without esophagitis   5. Degenerative disc disease, cervical   6. Fibromyalgia   7. Anxiety state   8. Irritable bowel syndrome with diarrhea    -recent chest pain consistent with musculoskeletal etiology; EKG stable; non-exertional;onset with recent worsening neck pain.  Worsens with change in positions like rolling over in bed. -obtain labs for chronic disease management; continue current medications. -ongoing family stressors; coping well. -IBS symptoms have improved with physical therapy for pelvic floor strengthening.   Orders Placed This Encounter  Procedures  . Lipid panel  . Comprehensive metabolic panel  . CBC with Differential/Platelet  . Comprehensive metabolic panel  . POCT urinalysis dipstick  . POCT glucose (manual entry)  . POCT glycosylated hemoglobin (Hb A1C)  . EKG 12-Lead   No orders of the defined types were placed in this encounter.   Return in about 3 months (around 01/22/2017) for recheck diabetes, hypercholesterolemia.   Kristi Elayne Guerin, M.D. Primary [facility] at [facility] [address] [place], [place]  [address] [phone] phone [phone] fax

## 2016-10-26 LAB — CBC WITH DIFFERENTIAL/PLATELET
Basophils Absolute: 0 10*3/uL (ref 0.0–0.2)
Basos: 1 %
EOS (ABSOLUTE): 0 10*3/uL (ref 0.0–0.4)
EOS: 1 %
HEMATOCRIT: 41.2 % (ref 34.0–46.6)
Hemoglobin: 14.1 g/dL (ref 11.1–15.9)
Immature Grans (Abs): 0 10*3/uL (ref 0.0–0.1)
Immature Granulocytes: 0 %
LYMPHS ABS: 1.4 10*3/uL (ref 0.7–3.1)
LYMPHS: 32 %
MCH: 31.6 pg (ref 26.6–33.0)
MCHC: 34.2 g/dL (ref 31.5–35.7)
MCV: 92 fL (ref 79–97)
MONOCYTES: 7 %
Monocytes Absolute: 0.3 10*3/uL (ref 0.1–0.9)
Neutrophils Absolute: 2.6 10*3/uL (ref 1.4–7.0)
Neutrophils: 59 %
Platelets: 204 10*3/uL (ref 150–379)
RBC: 4.46 x10E6/uL (ref 3.77–5.28)
RDW: 13.7 % (ref 12.3–15.4)
WBC: 4.3 10*3/uL (ref 3.4–10.8)

## 2016-10-26 LAB — COMPREHENSIVE METABOLIC PANEL
ALBUMIN: 4.6 g/dL (ref 3.6–4.8)
ALK PHOS: 53 IU/L (ref 39–117)
ALT: 36 IU/L — AB (ref 0–32)
AST: 19 IU/L (ref 0–40)
Albumin/Globulin Ratio: 2.4 — ABNORMAL HIGH (ref 1.2–2.2)
BUN / CREAT RATIO: 23 (ref 12–28)
BUN: 23 mg/dL (ref 8–27)
Bilirubin Total: 0.5 mg/dL (ref 0.0–1.2)
CO2: 24 mmol/L (ref 18–29)
CREATININE: 1 mg/dL (ref 0.57–1.00)
Calcium: 9.1 mg/dL (ref 8.7–10.3)
Chloride: 98 mmol/L (ref 96–106)
GFR calc non Af Amer: 61 mL/min/{1.73_m2} (ref >59)
GFR, EST AFRICAN AMERICAN: 71 mL/min/{1.73_m2} (ref >59)
GLUCOSE: 150 mg/dL — AB (ref 65–99)
Globulin, Total: 1.9 g/dL (ref 1.5–4.5)
Potassium: 4.1 mmol/L (ref 3.5–5.2)
Sodium: 141 mmol/L (ref 134–144)
TOTAL PROTEIN: 6.5 g/dL (ref 6.0–8.5)

## 2016-10-26 LAB — LIPID PANEL
Chol/HDL Ratio: 2.3 ratio units (ref 0.0–4.4)
Cholesterol, Total: 176 mg/dL (ref 100–199)
HDL: 75 mg/dL (ref >39)
LDL Calculated: 85 mg/dL (ref 0–99)
Triglycerides: 81 mg/dL (ref 0–149)
VLDL Cholesterol Cal: 16 mg/dL (ref 5–40)

## 2016-11-04 ENCOUNTER — Other Ambulatory Visit: Payer: Self-pay | Admitting: Rheumatology

## 2016-11-06 NOTE — Telephone Encounter (Signed)
Last visit 09/20/16 Next visit 03/21/17 Labs PCP 10/25/16 mildly elevated ALT otherwise WNL Ok to refill per Dr Estanislado Pandy

## 2016-11-23 ENCOUNTER — Other Ambulatory Visit: Payer: Self-pay | Admitting: Neurology

## 2016-11-23 DIAGNOSIS — G2581 Restless legs syndrome: Secondary | ICD-10-CM

## 2016-12-21 ENCOUNTER — Other Ambulatory Visit: Payer: Self-pay | Admitting: Family Medicine

## 2017-01-16 ENCOUNTER — Telehealth: Payer: Self-pay | Admitting: Family Medicine

## 2017-01-16 NOTE — Telephone Encounter (Signed)
LMOM TO CALL AND RESCHEDULE HER APPOINTMENT THAT SHE HAD WITH SMITH ON 01-24-17 SHE WILL BE OUT OF THE OFFICE BETWEEN 8-11

## 2017-01-24 ENCOUNTER — Ambulatory Visit: Payer: 59 | Admitting: Family Medicine

## 2017-01-24 ENCOUNTER — Other Ambulatory Visit: Payer: Self-pay | Admitting: Family Medicine

## 2017-01-26 ENCOUNTER — Other Ambulatory Visit: Payer: Self-pay | Admitting: Family Medicine

## 2017-01-27 ENCOUNTER — Ambulatory Visit: Payer: 59 | Admitting: Family Medicine

## 2017-01-31 ENCOUNTER — Ambulatory Visit (INDEPENDENT_AMBULATORY_CARE_PROVIDER_SITE_OTHER): Payer: 59 | Admitting: Family Medicine

## 2017-01-31 ENCOUNTER — Other Ambulatory Visit: Payer: Self-pay | Admitting: Rheumatology

## 2017-01-31 ENCOUNTER — Encounter: Payer: Self-pay | Admitting: Family Medicine

## 2017-01-31 ENCOUNTER — Ambulatory Visit (INDEPENDENT_AMBULATORY_CARE_PROVIDER_SITE_OTHER): Payer: 59

## 2017-01-31 VITALS — BP 128/81 | HR 67 | Temp 98.1°F | Resp 18 | Ht 65.16 in | Wt 175.0 lb

## 2017-01-31 DIAGNOSIS — E1121 Type 2 diabetes mellitus with diabetic nephropathy: Secondary | ICD-10-CM

## 2017-01-31 DIAGNOSIS — M899 Disorder of bone, unspecified: Secondary | ICD-10-CM

## 2017-01-31 DIAGNOSIS — G5611 Other lesions of median nerve, right upper limb: Secondary | ICD-10-CM | POA: Diagnosis not present

## 2017-01-31 DIAGNOSIS — M25521 Pain in right elbow: Secondary | ICD-10-CM

## 2017-01-31 DIAGNOSIS — K580 Irritable bowel syndrome with diarrhea: Secondary | ICD-10-CM

## 2017-01-31 DIAGNOSIS — K219 Gastro-esophageal reflux disease without esophagitis: Secondary | ICD-10-CM

## 2017-01-31 DIAGNOSIS — E7800 Pure hypercholesterolemia, unspecified: Secondary | ICD-10-CM

## 2017-01-31 DIAGNOSIS — I5032 Chronic diastolic (congestive) heart failure: Secondary | ICD-10-CM

## 2017-01-31 DIAGNOSIS — F411 Generalized anxiety disorder: Secondary | ICD-10-CM | POA: Diagnosis not present

## 2017-01-31 DIAGNOSIS — M797 Fibromyalgia: Secondary | ICD-10-CM | POA: Diagnosis not present

## 2017-01-31 NOTE — Telephone Encounter (Signed)
ok 

## 2017-01-31 NOTE — Patient Instructions (Addendum)
IF you received an x-ray today, you will receive an invoice from [facility]. Please contact [facility] at [phone] with questions or concerns regarding your invoice.   IF you received labwork today, you will receive an invoice from [facility]. Please contact [facility] at [phone] with questions or concerns regarding your invoice.   Our billing staff will not be able to assist you with questions regarding bills from these companies.  You will be contacted with the lab results as soon as they are available. The fastest way to get your results is to activate your My Chart account. Instructions are located on the last page of this paperwork. If you have not heard from [place] regarding the results in 2 weeks, please contact this office.     Pronator Syndrome Rehab Ask your health care provider which exercises are safe for you. Do exercises exactly as told by your health care provider and adjust them as directed. It is normal to feel mild stretching, pulling, tightness, or discomfort as you do these exercises, but you should stop right away if you feel sudden pain or your pain gets worse.Do not begin these exercises until told by your health care provider. Stretching and range of motion exercises These exercises warm up your muscles and joints and improve the movement and flexibility of your forearm. These exercises also help to relieve pain, numbness, and tingling. Exercise A: Wrist flexion and extension, active 1. Bend your left / right elbow to an "L" shape (about 90 degrees). 2. Bend your wrist so your fingers point downward. 3. Gently bring your wrist up toward the ceiling. 4. Hold this position for __________ seconds. 5. Slowly return to the starting position. Repeat __________ times. Complete this exercise __________ times a day. Exercise B: Forearm rotation, active 1. Stand with your left / right elbow at your side. 2. Bend your left / right elbow to an "L"  shape (about 90 degrees). 3. Gently rotate your left / right forearm so your palm faces the floor. 4. Next, gently rotate your forearm so your palm faces the ceiling. 5. Go back and forth between the two rotation motions for __________ seconds. Repeat __________ times. Complete this exercise __________ times a day. Exercise C: Elbow flexion and extension, active 1. Stand with your left / right arm straight at your side. 2. Turn your left / right palm so it faces behind you. 3. Gently bend your left / right elbow so your palm faces the floor. 4. Hold this position for __________ seconds. 5. Slowly return to the starting position. Repeat __________ times. Complete this exercise __________ times a day. Exercise D: Biceps stretch 1. Stand with your back to a sturdy chair. 2. Rest the back of your left / right hand on the back of the chair. Your elbow should be straight, and your palm should face the ceiling. 3. Slowly take 1-2 steps forward, stopping when you feel a gentle stretch in the top of your forearm or in your biceps. 4. Hold this position for __________ seconds. 5. Slowly return to the starting position. Repeat __________ times. Complete this exercise __________ times a day. Exercise E: Median nerve mobilization for pronator syndrome 1. Stand with your left / right elbow bent to an "L" shape (90 degrees) and your palm facing down. 2. Use your other hand to gently bend your wrist backwards. 3. Gently tilt your head so your left / right ear goes toward your left / right shoulder. As you tilt  your head, gently straighten your elbow and allow your wrist to bend forward. 4. Hold this position for __________ seconds. 5. Slowly return to the starting position. Repeat __________ times. Complete this exercise __________ times a day. This information is not intended to replace advice given to you by your health care provider. Make sure you discuss any questions you have with your health care  provider. Document Released: 08/14/2005 Document Revised: 04/18/2016 Document Reviewed: 04/25/2015 Elsevier Interactive Patient Education  [employer].

## 2017-01-31 NOTE — Telephone Encounter (Signed)
Last Visit: 09/20/16 Next Visit: 03/21/17 Labs: 10/25/16 Elevated glucose, ALT 36 previously normal  Okay to refill Fosamax?

## 2017-01-31 NOTE — Progress Notes (Signed)
Subjective:    Patient ID: Dorothy [name], female    DOB: 21-Jun-1956, 61 y.o.   MRN: [id number]  01/31/2017  Follow-up (3 month recheck A1C) and Shoulder Pain (x 1 month )   HPI This 61 y.o. female presents for evaluation of diabetes, hypercholesterolemia, anxiety/depression, shoulder pain.  No changes to management made at last visit. Sugars 187; running high. Lots of stress.  Not checking sugar regularly. R elbow pain and R shoulder pain: lateral elbow with burning.  R shoulder blade started one week ago.  Sharp stabbing pain like a knife; not all the time; tension.  Will prop up on car and not sure if irritating R shoulder blade.  RIGHT HANDED.  Receptionist.  Last year, having burning and swelling in LEFT second MCP; s/p ultrasound in both hands; no abnormality other than OA.  Recently got conceal to carry.  Burning in both hands again.  Using Voltaren gel on elbow and hand.  Taking Skelaxin for shoulder pain.  RUQ pain: started drinking Green tea; horrible pain  In RUQ.  Stopped green tea.  S/p cholecystectomy; regular green tea.  Decaf green tea.  Bottled diet green tea; started swelling a little bit.  Had a moderate amount of sodium.  Tea bags.  Reflux worsened.     BP Readings from Last 3 Encounters:  01/31/17 128/81  10/25/16 117/79  09/20/16 120/80   Wt Readings from Last 3 Encounters:  01/31/17 175 lb (79.4 kg)  10/25/16 170 lb 6.4 oz (77.3 kg)  09/20/16 175 lb (79.4 kg)   Immunization History  Administered Date(s) Administered  . Hepatitis B 12/14/2011, 06/11/2012, 01/07/2013  . Influenza Split 06/11/2012  . Influenza,inj,Quad PF,36+ Mos 07/28/2013, 07/15/2014, 06/14/2015, 07/26/2016  . Pneumococcal Polysaccharide-23 06/14/2015  . Pneumococcal-Unspecified 08/29/2007  . Tdap 08/28/2008    Review of Systems  Constitutional: Negative for chills, diaphoresis, fatigue and fever.  Eyes: Negative for visual disturbance.  Respiratory: Negative for cough and shortness of  breath.   Cardiovascular: Negative for chest pain, palpitations and leg swelling.  Gastrointestinal: Negative for abdominal pain, constipation, diarrhea, nausea and vomiting.  Endocrine: Negative for cold intolerance, heat intolerance, polydipsia, polyphagia and polyuria.  Musculoskeletal: Positive for arthralgias and myalgias.  Neurological: Negative for dizziness, tremors, seizures, syncope, facial asymmetry, speech difficulty, weakness, light-headedness, numbness and headaches.  Psychiatric/Behavioral: Negative for dysphoric mood, self-injury and sleep disturbance. The patient is not nervous/anxious.     Past Medical History:  Diagnosis Date  . Allergic rhinitis, cause unspecified   . Anxiety   . B12 deficiency   . Benign paroxysmal positional vertigo   . Choledocholithiasis   . Congestive heart failure ([place])   . Depression   . Diabetes mellitus   . Fatty liver   . Fatty liver 09/04/12  . Fibromyalgia   . Gastritis   . GERD (gastroesophageal reflux disease)   . Hematuria, unspecified   . Hiatal hernia   . Hx of adenomatous colonic polyps   . Hx of cardiovascular stress test    ETT-Myoview (12/15):  No ischemia.  No ECG changes.  EF 73%.  Low Risk.   [name] Hx of echocardiogram    Echo (12/15):  EF 60-65%, no RWMA, normal diast function, mild RVE  . Hyperlipidemia   . Meniere's disease, unspecified   . Migraine   . Mononeuritis of unspecified site   . Osteoarthritis   . Osteoporosis, unspecified   . Otosclerosis, unspecified   . Peptic ulcer, unspecified site, unspecified as acute or  chronic, without mention of hemorrhage, perforation, or obstruction   . Symptomatic menopausal or female climacteric states   . Unspecified disorder of kidney and ureter    Past Surgical History:  Procedure Laterality Date  . BILE DUCT EXPLORATION     gallstone removed  . BREAST ENHANCEMENT SURGERY Bilateral   . BUNIONECTOMY Left 12/2013  . CHOLECYSTECTOMY    . FOOT SURGERY     right  .  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 MOUTH SURGERY    . SHOULDER SURGERY     left  . TOTAL ABDOMINAL HYSTERECTOMY     pelvic pain/scar tissue; ovaries intact.  . TUBAL LIGATION     Allergies  Allergen Reactions  . Bactrim [Sulfamethoxazole-Trimethoprim] Other (See Comments)    blisters  . Keflex [Cephalexin] Hives  . Macrobid [Nitrofurantoin Macrocrystal] Other (See Comments)    blisters  . Nitrofurantoin Nausea And Vomiting and Other (See Comments)    Blisters   . Sulfa Drugs Cross Reactors Itching  . Adhesive [Tape] Other (See Comments)    Tears skins  . Codeine Itching  . Talwin [Pentazocine] Nausea And Vomiting and Rash    Muscle cramps and vision changes    Social History   Social History  . Marital status: Married    Spouse name: Richardson Landry  . Number of children: 3  . Years of education: 10th grade   Occupational History  .  [employer]   Social History Main Topics  . Smoking status: Never Smoker  . Smokeless tobacco: Never Used  . Alcohol use 0.0 oz/week     Comment: occasional 1 x month  . Drug use: No  . Sexual activity: Yes    Birth control/ protection: Post-menopausal, Surgical   Other Topics Concern  . Not on file   Social History Narrative      Marital status:  Married x 21 years; happily married,no abuse. 2nd marriage.      Children: 3 sons (41, 41, 37)---2 sons with substance abuse; 3 grandchildren; 3 step grandchildren; 2 gg.      Lives: with husband, son/Brian.      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      Drugs:none      Exercise: exercising at gym in 2017; 2 days per week; walking and weight lifting.      Seatbelt: 100%; no texting      Guns: none       Always uses seat belts. Smoke alarm and carbon monoxide detector in the home.No caffeine use. No unsecured guns in the home.    Family History  Problem Relation Age of Onset  . Prostate cancer Father   . Lung disease Father   . Diabetes Father   . Heart disease Father 16       CHF, AMI multiple/CABG  . COPD Father   . Heart attack Father   . Cancer Father        testicular cancer  . Diabetes Mother   . Hypertension Mother   . Hyperlipidemia Mother   . COPD Mother   . Cancer Mother        lung cancer  . Diabetes Brother   .  Hypertension Brother   . Obesity Brother   . Hyperlipidemia Brother   . COPD Brother   . Stroke Maternal Grandmother   . Heart disease Maternal Grandfather   . COPD Paternal Grandmother   . Diabetes Paternal Grandmother   . COPD Paternal Grandfather   . Heart attack Paternal Grandfather   . Colon cancer Cousin        died age 42  . Breast cancer Maternal Aunt        Objective:    BP 128/81   Pulse 67   Temp 98.1 F (36.7 C) (Oral)   Resp 18   Ht 5' 5.16" (1.655 m)   Wt 175 lb (79.4 kg)   SpO2 97%   BMI 28.98 kg/m  Physical Exam  Constitutional: She is oriented to person, place, and time. She appears well-developed and well-nourished. No distress.  HENT:  Head: Normocephalic and atraumatic.  Right Ear: External ear normal.  Left Ear: External ear normal.  Nose: Nose normal.  Mouth/Throat: Oropharynx is clear and moist.  Eyes: Conjunctivae and EOM are normal. Pupils are equal, round, and reactive to light.  Neck: Normal range of motion. Neck supple. Carotid bruit is not present. No thyromegaly present.  Cardiovascular: Normal rate, regular rhythm, normal heart sounds and intact distal pulses.  Exam reveals no gallop and no friction rub.   No murmur heard. Pulmonary/Chest: Effort normal and breath sounds normal. She has no wheezes. She has no rales.  Abdominal: Soft. Bowel sounds are normal. She exhibits no distension and no mass. There is  no tenderness. There is no rebound and no guarding.  Musculoskeletal:       Right elbow: She exhibits decreased range of motion. She exhibits no swelling, no effusion, no deformity and no laceration. Tenderness found. No radial head, no medial epicondyle, no lateral epicondyle and no olecranon process tenderness noted.       Right forearm: She exhibits no tenderness, no bony tenderness, no swelling and no edema.  Pain with supination and pronation of R elbow.  Pain with extension of elbow; non-tender with palpation of elbow.  Lymphadenopathy:    She has no cervical adenopathy.  Neurological: She is alert and oriented to person, place, and time. No cranial nerve deficit.  Skin: Skin is warm and dry. No rash noted. She is not diaphoretic. No erythema. No pallor.  Psychiatric: She has a normal mood and affect. Her behavior is normal.    Depression screen [facility] 2/9 01/31/2017 10/25/2016 07/26/2016 04/18/2016 09/27/2015  Decreased Interest 0 0 0 0 0  Down, Depressed, Hopeless 0 0 0 0 0  PHQ - 2 Score 0 0 0 0 0       Assessment & Plan:   1. Right elbow pain   2. Type 2 diabetes mellitus with diabetic nephropathy, without long-term current use of insulin ([place])   3. Irritable bowel syndrome with diarrhea   4. Gastroesophageal reflux disease without esophagitis   5. Chronic diastolic heart failure ([place])   6. Anxiety state   7. Fibromyalgia   8. Pure hypercholesterolemia   9. Scapular dysfunction   10. Median nerve neuropathy, right    -new onset median nerve neuropathy with R elbow pain; not completely consistent with lateral epicondylitis because having significant pain with ROM of elbow; obtian xray; recommend rest, icing, and home exercise program. If no improvement in one month, call office for ortho referral/hand surgery referral. -new onset R scapular strain; recommend rest, heat, stretching. -obtain labs.   -  if HgbA1c remains above 7.0, add third diabetic agent/[place].  Orders Placed  This Encounter  Procedures  . DG ELBOW COMPLETE RIGHT (3+VIEW)    Standing Status:   Future    Number of Occurrences:   1    Standing Expiration Date:   01/31/2018    Order Specific Question:   Reason for Exam (SYMPTOM  OR DIAGNOSIS REQUIRED)    Answer:   R elbow pain; pain with extension and supination/pronation; no trauma    Order Specific Question:   Is the patient pregnant?    Answer:   No    Order Specific Question:   Preferred imaging location?    Answer:   External  . CBC with Differential/Platelet  . Comprehensive metabolic panel    Order Specific Question:   Has the patient fasted?    Answer:   Yes  . Hemoglobin A1c  . Lipid panel    Order Specific Question:   Has the patient fasted?    Answer:   Yes   No orders of the defined types were placed in this encounter.   Return in about 3 months (around 05/03/2017) for complete physical examiniation.   Gussie Murton Elayne Guerin, M.D. Primary Care at [facility] previously [facility] [place] [address] [place], [place]  [address] [phone] phone [phone] fax

## 2017-02-01 LAB — COMPREHENSIVE METABOLIC PANEL
ALBUMIN: 4.4 g/dL (ref 3.6–4.8)
ALK PHOS: 69 IU/L (ref 39–117)
ALT: 50 IU/L — ABNORMAL HIGH (ref 0–32)
AST: 38 IU/L (ref 0–40)
Albumin/Globulin Ratio: 2 (ref 1.2–2.2)
BUN / CREAT RATIO: 20 (ref 12–28)
BUN: 18 mg/dL (ref 8–27)
Bilirubin Total: 0.4 mg/dL (ref 0.0–1.2)
CHLORIDE: 97 mmol/L (ref 96–106)
CO2: 22 mmol/L (ref 18–29)
Calcium: 9.5 mg/dL (ref 8.7–10.3)
Creatinine, Ser: 0.9 mg/dL (ref 0.57–1.00)
GFR calc Af Amer: 80 mL/min/{1.73_m2} (ref >59)
GFR calc non Af Amer: 70 mL/min/{1.73_m2} (ref >59)
GLUCOSE: 160 mg/dL — AB (ref 65–99)
Globulin, Total: 2.2 g/dL (ref 1.5–4.5)
Potassium: 4.1 mmol/L (ref 3.5–5.2)
Sodium: 137 mmol/L (ref 134–144)
Total Protein: 6.6 g/dL (ref 6.0–8.5)

## 2017-02-01 LAB — CBC WITH DIFFERENTIAL/PLATELET
Basophils Absolute: 0 10*3/uL (ref 0.0–0.2)
Basos: 0 %
EOS (ABSOLUTE): 0.1 10*3/uL (ref 0.0–0.4)
Eos: 2 %
Hematocrit: 42.6 % (ref 34.0–46.6)
Hemoglobin: 14.1 g/dL (ref 11.1–15.9)
IMMATURE GRANS (ABS): 0 10*3/uL (ref 0.0–0.1)
IMMATURE GRANULOCYTES: 0 %
LYMPHS: 27 %
Lymphocytes Absolute: 1.3 10*3/uL (ref 0.7–3.1)
MCH: 30.7 pg (ref 26.6–33.0)
MCHC: 33.1 g/dL (ref 31.5–35.7)
MCV: 93 fL (ref 79–97)
MONOCYTES: 6 %
Monocytes Absolute: 0.3 10*3/uL (ref 0.1–0.9)
NEUTROS PCT: 65 %
Neutrophils Absolute: 3.1 10*3/uL (ref 1.4–7.0)
Platelets: 214 10*3/uL (ref 150–379)
RBC: 4.6 x10E6/uL (ref 3.77–5.28)
RDW: 13.4 % (ref 12.3–15.4)
WBC: 4.8 10*3/uL (ref 3.4–10.8)

## 2017-02-01 LAB — HEMOGLOBIN A1C
Est. average glucose Bld gHb Est-mCnc: 166 mg/dL
HEMOGLOBIN A1C: 7.4 % — AB (ref 4.8–5.6)

## 2017-02-01 LAB — LIPID PANEL
CHOL/HDL RATIO: 2.6 ratio (ref 0.0–4.4)
Cholesterol, Total: 195 mg/dL (ref 100–199)
HDL: 74 mg/dL (ref >39)
LDL CALC: 95 mg/dL (ref 0–99)
TRIGLYCERIDES: 129 mg/dL (ref 0–149)
VLDL Cholesterol Cal: 26 mg/dL (ref 5–40)

## 2017-02-18 ENCOUNTER — Encounter (HOSPITAL_COMMUNITY): Payer: Self-pay | Admitting: Emergency Medicine

## 2017-02-18 ENCOUNTER — Ambulatory Visit (HOSPITAL_COMMUNITY)
Admission: EM | Admit: 2017-02-18 | Discharge: 2017-02-18 | Disposition: A | Discharge status: Home / Self Care | Payer: 59 | Attending: Family Medicine | Admitting: Family Medicine

## 2017-02-18 DIAGNOSIS — H8101 Meniere's disease, right ear: Secondary | ICD-10-CM

## 2017-02-18 DIAGNOSIS — R197 Diarrhea, unspecified: Secondary | ICD-10-CM

## 2017-02-18 DIAGNOSIS — R42 Dizziness and giddiness: Secondary | ICD-10-CM

## 2017-02-18 MED ORDER — ONDANSETRON HCL 4 MG PO TABS: 4.0000 mg | ORAL_TABLET | Freq: Four times a day (QID) | ORAL | 0 refills | Status: DC

## 2017-02-18 MED ORDER — MECLIZINE HCL 25 MG PO TABS: 25.0000 mg | ORAL_TABLET | Freq: Three times a day (TID) | ORAL | 0 refills | Status: DC | PRN

## 2017-02-18 NOTE — Discharge Instructions (Signed)
YouTube actually has some good videos on how to do this if the instructions do not make sense.   If you are still having diarrhea, follow up with PCP.   If you have continuous dizziness, go to the ER.

## 2017-02-18 NOTE — ED Triage Notes (Signed)
The patient presented to the [facility] with a complaint of vertigo and N/V/D x 5 days.

## 2017-02-18 NOTE — ED Provider Notes (Signed)
[facility]    CSN: [id number] Arrival date & time: 02/18/17  1431     History   Chief Complaint Chief Complaint  Patient presents with  . Dizziness    HPI Dorothy [name] is a 61 y.o. female.   HPI  In the past 5 days, the patient has had nausea, vomiting, and diarrhea. She has associated vertigo. She also has a history of Mnire's disease. Since her abdominal issues have started, she has been eating crackers and soup. She is on hydrochlorothiazide for her Mnire's. There is been no recent change in her dose. She also notes intermittent dizziness that she describes as spinning. It typically lasts for a few seconds before going away. It is brought on by movement such as rolling over in bed or looking over her shoulder in the car. She has had this in the past and was doing some home exercises without relief. She has been taking over-the-counter dizziness medicine without relief also. She denies any vision changes, drainage from her ears, hearing changes, head injury, difficulty with speech, trouble swallowing, numbness, taking, or weakness.  Past Medical History:  Diagnosis Date  . Allergic rhinitis, cause unspecified   . Anxiety   . B12 deficiency   . Benign paroxysmal positional vertigo   . Choledocholithiasis   . Congestive heart failure ([place])   . Depression   . Diabetes mellitus   . Fatty liver   . Fatty liver 09/04/12  . Fibromyalgia   . Gastritis   . GERD (gastroesophageal reflux disease)   . Hematuria, unspecified   . Hiatal hernia   . Hx of adenomatous colonic polyps   . Hx of cardiovascular stress test    ETT-Myoview (12/15):  No ischemia.  No ECG changes.  EF 73%.  Low Risk.   [name] Hx of echocardiogram    Echo (12/15):  EF 60-65%, no RWMA, normal diast function, mild RVE  . Hyperlipidemia   . Meniere's disease, unspecified   . Migraine   . Mononeuritis of unspecified site   . Osteoarthritis   . Osteoporosis, unspecified   . Otosclerosis, unspecified    . Peptic ulcer, unspecified site, unspecified as acute or chronic, without mention of hemorrhage, perforation, or obstruction   . Symptomatic menopausal or female climacteric states   . Unspecified disorder of kidney and ureter     Patient Active Problem List   Diagnosis Date Noted  . Degenerative disc disease, cervical 09/20/2016  . Osteoporosis with current pathological fracture 06/19/2016  . Other secondary osteoarthritis of both hands 04/15/2016  . Osteoarthritis of pelvis 04/15/2016  . DDD (degenerative disc disease), lumbar 04/15/2016  . Neuropathy 04/15/2016  . IBS (irritable bowel syndrome) 04/15/2016  . Chronic diastolic heart failure ([place]) 06/14/2015  . Premature ventricular contractions 12/10/2013  . Fibromyalgia 10/03/2012  . Abdominal pain, epigastric 10/01/2012  . DIARRHEA, CHRONIC 10/26/2009  . COLONIC POLYPS, ADENOMATOUS 09/21/2007  . DM2 (diabetes mellitus, type 2) ([place]) 09/21/2007  . HLD (hyperlipidemia) 09/21/2007  . Anxiety state 09/21/2007  . GERD 09/21/2007  . HIATAL HERNIA 09/21/2007  . CHOLEDOCHOLITHIASIS, HX OF 09/21/2007  . Acute gastritis 07/30/2007    Past Surgical History:  Procedure Laterality Date  . BILE DUCT EXPLORATION     gallstone removed  . BREAST ENHANCEMENT SURGERY Bilateral   . BUNIONECTOMY Left 12/2013  . CHOLECYSTECTOMY    . FOOT SURGERY     right  . MAXILLARY LE FORTE I OSTEOTOMY Bilateral 06/19/2013   Procedure: BILATERAL SAGITTAL SPLIT OSTEOMY  WITH RIGID FIXATION;  Surgeon: Michaela Corner, DDS;  Location: [facility];  Service: Oral Surgery;  Laterality: Bilateral;  . MOUTH SURGERY    . SHOULDER SURGERY     left  . TOTAL ABDOMINAL HYSTERECTOMY     pelvic pain/scar tissue; ovaries intact.  . TUBAL LIGATION       Home Medications    Prior to Admission medications   Medication Sig Start Date End Date Taking? Authorizing Provider  alendronate (FOSAMAX) 70 MG tablet TAKE ONE TABLET BY MOUTH ONCE WEEKLY 01/31/17   Bo Merino, MD  aspirin 81 MG tablet Take 81 mg by mouth every evening.    [provider]  Calcium Carb-Cholecalciferol (CALCIUM 500 +D) 500-400 MG-UNIT TABS Take 1 tablet by mouth daily.     [provider]  Cholecalciferol (VITAMIN D3) 2000 UNITS TABS Take 2,000 Units by mouth daily.     [provider]  DEXILANT 60 MG capsule TAKE ONE CAPSULE BY MOUTH DAILY 07/06/16   [name], MD  fluticasone [facility]) 50 MCG/ACT nasal spray Place 2 sprays into both nostrils daily. 04/18/16   [name], MD  gabapentin (NEURONTIN) 300 MG capsule Take 1 capsule (300 mg total) by mouth 3 (three) times daily. 09/20/16   Bo Merino, MD  hydrochlorothiazide (HYDRODIURIL) 25 MG tablet TAKE ONE TABLET BY MOUTH DAILY 01/27/17   [name], MD  ipratropium (ATROVENT) 0.03 % nasal spray Place 2 sprays into the nose 2 (two) times daily. 07/26/16   [name], MD  losartan (COZAAR) 50 MG tablet TAKE 1 TABLET (50 MG TOTAL) BY MOUTH DAILY. 04/18/16   [name], MD  meclizine (ANTIVERT) 25 MG tablet Take 1 tablet (25 mg total) by mouth 3 (three) times daily as needed for dizziness. 02/18/17   Shelda Pal, DO  metaxalone (SKELAXIN) 800 MG tablet Take 800 mg by mouth 3 (three) times daily.    [provider]  metFORMIN (GLUCOPHAGE) 1000 MG tablet TAKE ONE TABLET BY MOUTH TWICE A DAY WITH MEALS 01/24/17   [name], MD  omega-3 acid ethyl esters (LOVAZA) 1 g capsule Take 1 capsule (1 g total) by mouth 2 (two) times daily. 09/20/16   Bo Merino, MD  ondansetron (ZOFRAN) 4 MG tablet Take 1 tablet (4 mg total) by mouth every 6 (six) hours. 02/18/17   Shelda Pal, DO  Potassium Chloride ER 20 MEQ TBCR Take 2 tablets (40 meQ) by mouth daily 05/09/16   Sherren Mocha, MD  pramipexole (MIRAPEX) 0.125 MG tablet Take 2 tablets (0.25 mg total) by mouth at bedtime. 08/23/16   Star Age, MD  PREVALITE 4 g packet TAKE 1 PACKET (4G TOTAL) BY MOUTH  TWO TIMES A DAY 10/02/16   Mauri Pole, MD  simvastatin (ZOCOR) 40 MG tablet Take 1 tablet (40 mg total) by mouth at bedtime. 04/18/16   [name], MD  sitaGLIPtin (JANUVIA) 100 MG tablet Take 1 tablet (100 mg total) by mouth daily. 04/18/16   [name], MD  venlafaxine XR (EFFEXOR-XR) 75 MG 24 hr capsule Take 1 capsule (75 mg total) by mouth every evening. 04/18/16   [name], MD  VOLTAREN 1 % GEL Apply 2 g topically 2 (two) times daily as needed (pain).  04/27/14   [provider]    Family History Family History  Problem Relation Age of Onset  . Prostate cancer Father   . Lung disease Father   .  Diabetes Father   . Heart disease Father 54       CHF, AMI multiple/CABG  . COPD Father   . Heart attack Father   . Cancer Father        testicular cancer  . Diabetes Mother   . Hypertension Mother   . Hyperlipidemia Mother   . COPD Mother   . Cancer Mother        lung cancer  . Diabetes Brother   . Hypertension Brother   . Obesity Brother   . Hyperlipidemia Brother   . COPD Brother   . Stroke Maternal Grandmother   . Heart disease Maternal Grandfather   . COPD Paternal Grandmother   . Diabetes Paternal Grandmother   . COPD Paternal Grandfather   . Heart attack Paternal Grandfather   . Colon cancer Cousin        died age 24  . Breast cancer Maternal Aunt     Social History Social History  Substance Use Topics  . Smoking status: Never Smoker  . Smokeless tobacco: Never Used  . Alcohol use 0.0 oz/week     Comment: occasional 1 x month     Allergies   Bactrim [sulfamethoxazole-trimethoprim]; Keflex [cephalexin]; Macrobid [nitrofurantoin macrocrystal]; Nitrofurantoin; Sulfa drugs cross reactors; Adhesive [tape]; Codeine; and Talwin [pentazocine]   Review of Systems Review of Systems  HENT: Negative for hearing loss.   Neurological: Positive for dizziness.    Physical Exam Triage Vital Signs ED Triage Vitals  Enc Vitals Group     BP  02/18/17 1440 (!) 145/89     Pulse Rate 02/18/17 1440 63     Resp 02/18/17 1440 18     Temp 02/18/17 1440 97.7 F (36.5 C)     Temp Source 02/18/17 1440 Oral     SpO2 02/18/17 1440 100 %   Updated Vital Signs BP (!) 145/89 (BP Location: Right Arm)   Pulse 63   Temp 97.7 F (36.5 C) (Oral)   Resp 18   SpO2 100%   Physical Exam  Constitutional: She is oriented to person, place, and time. She appears well-developed.  HENT:  Head: Normocephalic and atraumatic.  Right Ear: External ear normal.  Left Ear: External ear normal.  Nose: Nose normal.  Mouth/Throat: Oropharynx is clear and moist.  Eyes: EOM are normal. Pupils are equal, round, and reactive to light.  Neck: Normal range of motion. Neck supple.  Cardiovascular: Normal rate and regular rhythm.   No murmur heard. No bruits  Pulmonary/Chest: Effort normal and breath sounds normal. No respiratory distress.  Musculoskeletal:  5/5 strength throughout, equal b/l  Neurological: She is alert and oriented to person, place, and time. She displays normal reflexes. No cranial nerve deficit or sensory deficit. Coordination normal.  +Dix-Hall-Pike to the R 4/4 patellar reflex, 2/4 calcaneal reflex, 2/4 biceps reflex b/l, no clonus  Skin: Skin is warm and dry. She is not diaphoretic.  Psychiatric: She has a normal mood and affect. Judgment normal.     UC Treatments / Results  Procedures Procedures none  Initial Impression / Assessment and Plan / UC Course  I have reviewed the triage vital signs and the nursing notes.  Pertinent labs & imaging results that were available during my care of the patient were reviewed by me and considered in my medical decision making (see chart for details).     Patient presents with dizziness, Mnire's disease, and GI illness. It could be in part due to increased sodium intake with crackers  and soup that is exacerbating her Mnire's. Continue on current dose of Hydrocort thiazide. We'll call in  [place] and Zofran while recommending healthy maneuvers for dizziness that seems more related to BPPV. For diarrhea, I suggest she continue to push fluids and follow-up with her primary care provider for possible stool culture. I do not think she is having a cerebellar stroke or any other sinister intracranial process. She is to be discharged in stable condition. The patient and her husband voiced understanding and agreement to the plan.  Final Clinical Impressions(s) / UC Diagnoses   Final diagnoses:  Dizziness  Meniere disease, right  Diarrhea, unspecified type    New Prescriptions Discharge Medication List as of 02/18/2017  3:09 PM    START taking these medications   Details  meclizine (ANTIVERT) 25 MG tablet Take 1 tablet (25 mg total) by mouth 3 (three) times daily as needed for dizziness., Starting Sun 02/18/2017, Normal    ondansetron (ZOFRAN) 4 MG tablet Take 1 tablet (4 mg total) by mouth every 6 (six) hours., Starting Sun 02/18/2017, Normal         Shelda Pal, [place] 02/18/17 1553

## 2017-02-19 ENCOUNTER — Ambulatory Visit (INDEPENDENT_AMBULATORY_CARE_PROVIDER_SITE_OTHER): Payer: 59 | Admitting: Emergency Medicine

## 2017-02-19 ENCOUNTER — Encounter: Payer: Self-pay | Admitting: Emergency Medicine

## 2017-02-19 VITALS — BP 120/80 | HR 70 | Temp 98.5°F | Resp 16 | Ht 65.0 in | Wt 174.2 lb

## 2017-02-19 DIAGNOSIS — J0100 Acute maxillary sinusitis, unspecified: Secondary | ICD-10-CM

## 2017-02-19 DIAGNOSIS — R0981 Nasal congestion: Secondary | ICD-10-CM | POA: Diagnosis not present

## 2017-02-19 DIAGNOSIS — J3489 Other specified disorders of nose and nasal sinuses: Secondary | ICD-10-CM

## 2017-02-19 MED ORDER — AZITHROMYCIN 250 MG PO TABS: ORAL_TABLET | ORAL | 0 refills | Status: DC

## 2017-02-19 MED ORDER — PREDNISONE 20 MG PO TABS: 20.0000 mg | ORAL_TABLET | Freq: Every day | ORAL | 0 refills | Status: AC

## 2017-02-19 MED ORDER — HYDROCODONE-ACETAMINOPHEN 5-325 MG PO TABS: 1.0000 | ORAL_TABLET | Freq: Four times a day (QID) | ORAL | 0 refills | Status: DC | PRN

## 2017-02-19 MED ORDER — FLUTICASONE PROPIONATE 50 MCG/ACT NA SUSP: 2.0000 | Freq: Every day | NASAL | 6 refills | Status: DC

## 2017-02-19 NOTE — Progress Notes (Signed)
Dorothy [name] 61 y.o.   Chief Complaint  Patient presents with  . Sinus Problem    WITH PRESSURE and PAIN x 2 days    HISTORY OF PRESENT ILLNESS: This is a 61 y.o. female complaining of pain and pressure to right maxillary area x 2-3 days; has h/o Meniere's disease and this is making it worse; seen at [facility] yesterday and diagnosed with vertigo; initial concern was stroke possibility. Denies any neurological deficits/symptoms.  HPI   Prior to Admission medications   Medication Sig Start Date End Date Taking? Authorizing Provider  alendronate (FOSAMAX) 70 MG tablet TAKE ONE TABLET BY MOUTH ONCE WEEKLY 01/31/17   Bo Merino, MD  aspirin 81 MG tablet Take 81 mg by mouth every evening.    [provider]  Calcium Carb-Cholecalciferol (CALCIUM 500 +D) 500-400 MG-UNIT TABS Take 1 tablet by mouth daily.     [provider]  Cholecalciferol (VITAMIN D3) 2000 UNITS TABS Take 2,000 Units by mouth daily.     [provider]  DEXILANT 60 MG capsule TAKE ONE CAPSULE BY MOUTH DAILY 07/06/16   [name], MD  fluticasone [facility]) 50 MCG/ACT nasal spray Place 2 sprays into both nostrils daily. 04/18/16   [name], MD  gabapentin (NEURONTIN) 300 MG capsule Take 1 capsule (300 mg total) by mouth 3 (three) times daily. 09/20/16   Bo Merino, MD  hydrochlorothiazide (HYDRODIURIL) 25 MG tablet TAKE ONE TABLET BY MOUTH DAILY 01/27/17   [name], MD  ipratropium (ATROVENT) 0.03 % nasal spray Place 2 sprays into the nose 2 (two) times daily. 07/26/16   [name], MD  losartan (COZAAR) 50 MG tablet TAKE 1 TABLET (50 MG TOTAL) BY MOUTH DAILY. 04/18/16   [name], MD  meclizine (ANTIVERT) 25 MG tablet Take 1 tablet (25 mg total) by mouth 3 (three) times daily as needed for dizziness. 02/18/17   Shelda Pal, DO  metaxalone (SKELAXIN) 800 MG tablet Take 800 mg by mouth 3 (three) times daily.    [provider]  metFORMIN  (GLUCOPHAGE) 1000 MG tablet TAKE ONE TABLET BY MOUTH TWICE A DAY WITH MEALS 01/24/17   [name], MD  omega-3 acid ethyl esters (LOVAZA) 1 g capsule Take 1 capsule (1 g total) by mouth 2 (two) times daily. 09/20/16   Bo Merino, MD  ondansetron (ZOFRAN) 4 MG tablet Take 1 tablet (4 mg total) by mouth every 6 (six) hours. 02/18/17   Shelda Pal, DO  Potassium Chloride ER 20 MEQ TBCR Take 2 tablets (40 meQ) by mouth daily 05/09/16   Sherren Mocha, MD  pramipexole (MIRAPEX) 0.125 MG tablet Take 2 tablets (0.25 mg total) by mouth at bedtime. 08/23/16   Star Age, MD  PREVALITE 4 g packet TAKE 1 PACKET (4G TOTAL) BY MOUTH TWO TIMES A DAY 10/02/16   Mauri Pole, MD  simvastatin (ZOCOR) 40 MG tablet Take 1 tablet (40 mg total) by mouth at bedtime. 04/18/16   [name], MD  sitaGLIPtin (JANUVIA) 100 MG tablet Take 1 tablet (100 mg total) by mouth daily. 04/18/16   [name], MD  venlafaxine XR (EFFEXOR-XR) 75 MG 24 hr capsule Take 1 capsule (75 mg total) by mouth every evening. 04/18/16   [name], MD  VOLTAREN 1 % GEL Apply 2 g topically 2 (two) times daily as needed (pain).  04/27/14   [provider]    Allergies  Allergen Reactions  .  Bactrim [Sulfamethoxazole-Trimethoprim] Other (See Comments)    blisters  . Keflex [Cephalexin] Hives  . Macrobid [Nitrofurantoin Macrocrystal] Other (See Comments)    blisters  . Nitrofurantoin Nausea And Vomiting and Other (See Comments)    Blisters   . Sulfa Drugs Cross Reactors Itching  . Adhesive [Tape] Other (See Comments)    Tears skins  . Codeine Itching  . Talwin [Pentazocine] Nausea And Vomiting and Rash    Muscle cramps and vision changes    Patient Active Problem List   Diagnosis Date Noted  . Degenerative disc disease, cervical 09/20/2016  . Osteoporosis with current pathological fracture 06/19/2016  . Other secondary osteoarthritis of both hands 04/15/2016  . Osteoarthritis of pelvis  04/15/2016  . DDD (degenerative disc disease), lumbar 04/15/2016  . Neuropathy 04/15/2016  . IBS (irritable bowel syndrome) 04/15/2016  . Chronic diastolic heart failure ([place]) 06/14/2015  . Premature ventricular contractions 12/10/2013  . Fibromyalgia 10/03/2012  . Abdominal pain, epigastric 10/01/2012  . DIARRHEA, CHRONIC 10/26/2009  . COLONIC POLYPS, ADENOMATOUS 09/21/2007  . DM2 (diabetes mellitus, type 2) ([place]) 09/21/2007  . HLD (hyperlipidemia) 09/21/2007  . Anxiety state 09/21/2007  . GERD 09/21/2007  . HIATAL HERNIA 09/21/2007  . CHOLEDOCHOLITHIASIS, HX OF 09/21/2007  . Acute gastritis 07/30/2007    Past Medical History:  Diagnosis Date  . Allergic rhinitis, cause unspecified   . Anxiety   . B12 deficiency   . Benign paroxysmal positional vertigo   . Choledocholithiasis   . Congestive heart failure ([place])   . Depression   . Diabetes mellitus   . Fatty liver   . Fatty liver 09/04/12  . Fibromyalgia   . Gastritis   . GERD (gastroesophageal reflux disease)   . Hematuria, unspecified   . Hiatal hernia   . Hx of adenomatous colonic polyps   . Hx of cardiovascular stress test    ETT-Myoview (12/15):  No ischemia.  No ECG changes.  EF 73%.  Low Risk.   [name] Hx of echocardiogram    Echo (12/15):  EF 60-65%, no RWMA, normal diast function, mild RVE  . Hyperlipidemia   . Meniere's disease, unspecified   . Migraine   . Mononeuritis of unspecified site   . Osteoarthritis   . Osteoporosis, unspecified   . Otosclerosis, unspecified   . Peptic ulcer, unspecified site, unspecified as acute or chronic, without mention of hemorrhage, perforation, or obstruction   . Symptomatic menopausal or female climacteric states   . Unspecified disorder of kidney and ureter     Past Surgical History:  Procedure Laterality Date  . BILE DUCT EXPLORATION     gallstone removed  . BREAST ENHANCEMENT SURGERY Bilateral   . BUNIONECTOMY Left 12/2013  . CHOLECYSTECTOMY    . FOOT SURGERY      right  . 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 MOUTH SURGERY    . SHOULDER SURGERY     left  . TOTAL ABDOMINAL HYSTERECTOMY     pelvic pain/scar tissue; ovaries intact.  . TUBAL LIGATION      Social History   Social History  . Marital status: Married    Spouse name: Richardson Landry  . Number of children: 3  . Years of education: 10th grade   Occupational History  .  [employer]   Social History Main Topics  .  Smoking status: Never Smoker  . Smokeless tobacco: Never Used  . Alcohol use 0.0 oz/week     Comment: occasional 1 x month  . Drug use: No  . Sexual activity: Yes    Birth control/ protection: Post-menopausal, Surgical   Other Topics Concern  . Not on file   Social History Narrative      Marital status:  Married x 21 years; happily married,no abuse. 2nd marriage.      Children: 3 sons (41, 65, 37)---2 sons with substance abuse; 3 grandchildren; 3 step grandchildren; 2 gg.      Lives: with husband, son/Brian.      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      Drugs:none      Exercise: exercising at gym in 2017; 2 days per week; walking and weight lifting.      Seatbelt: 100%; no texting      Guns: none      Always uses seat belts. Smoke alarm and carbon monoxide detector in the home.No caffeine use. No unsecured guns in the home.     Family History  Problem Relation Age of Onset  . Prostate cancer Father   . Lung disease Father   . Diabetes Father   . Heart disease Father 24       CHF, AMI multiple/CABG  . COPD Father   . Heart attack Father   . Cancer Father        testicular cancer  . Diabetes Mother   . Hypertension Mother   . Hyperlipidemia Mother   . COPD Mother   .  Cancer Mother        lung cancer  . Diabetes Brother   . Hypertension Brother   . Obesity Brother   . Hyperlipidemia Brother   . COPD Brother   . Stroke Maternal Grandmother   . Heart disease Maternal Grandfather   . COPD Paternal Grandmother   . Diabetes Paternal Grandmother   . COPD Paternal Grandfather   . Heart attack Paternal Grandfather   . Colon cancer Cousin        died age 47  . Breast cancer Maternal Aunt    BP Readings from Last 3 Encounters:  02/19/17 120/80  02/18/17 (!) 145/89  01/31/17 128/81   Lab Results  Component Value Date   HGBA1C 7.4 (H) 01/31/2017     Review of Systems  Constitutional: Negative.  Negative for chills and fever.  HENT: Positive for congestion and sinus pain. Negative for ear discharge, ear pain, nosebleeds and sore throat.   Eyes: Negative for blurred vision, double vision, discharge and redness.  Respiratory: Negative for cough and shortness of breath.   Cardiovascular: Negative for chest pain and palpitations.  Gastrointestinal: Negative for abdominal pain, diarrhea, nausea and vomiting.  Genitourinary: Negative.   Musculoskeletal: Negative.   Skin: Negative.  Negative for rash.  Neurological: Positive for dizziness and headaches.  Endo/Heme/Allergies: Negative.   All other systems reviewed and are negative.  Vitals:   02/19/17 1453  BP: 120/80  Pulse: 70  Resp: 16  Temp: 98.5 F (36.9 C)     Physical Exam  Constitutional: She is oriented to person, place, and time. She appears well-developed and well-nourished.  HENT:  Head: Normocephalic.  Right Ear: External ear normal.  Left Ear: External ear normal.  Nose: Right sinus exhibits maxillary sinus tenderness.  Left sinus exhibits no maxillary sinus tenderness.    Mouth/Throat: Oropharynx is clear and moist.  Eyes: Conjunctivae and EOM are normal. Pupils are equal, round, and reactive to light.  Neck: Normal range of motion. Neck supple. No JVD present. No  thyromegaly present.  Cardiovascular: Normal rate, regular rhythm, normal heart sounds and intact distal pulses.   Pulmonary/Chest: Effort normal and breath sounds normal.  Abdominal: Soft. She exhibits no distension. There is no tenderness.  Musculoskeletal: Normal range of motion.  Lymphadenopathy:    She has no cervical adenopathy.  Neurological: She is alert and oriented to person, place, and time. No sensory deficit. She exhibits normal muscle tone.  Skin: Skin is warm and dry. Capillary refill takes less than 2 seconds. No rash noted.  Psychiatric: She has a normal mood and affect. Her behavior is normal.  Vitals reviewed.    ASSESSMENT & PLAN: Dorothy [name] was seen today for sinus problem.  Diagnoses and all orders for this visit:  Acute non-recurrent maxillary sinusitis -     azithromycin (ZITHROMAX) 250 MG tablet; Sig as indicated  Sinus pressure  Sinus pain -     HYDROcodone-acetaminophen (NORCO) 5-325 MG tablet; Take 1 tablet by mouth every 6 (six) hours as needed for moderate pain.  Sinus congestion -     predniSONE (DELTASONE) 20 MG tablet; Take 1 tablet (20 mg total) by mouth daily with breakfast. -     fluticasone (FLONASE) 50 MCG/ACT nasal spray; Place 2 sprays into both nostrils daily.    Patient Instructions       IF you received an x-ray today, you will receive an invoice from [facility]. Please contact [facility] at [phone] with questions or concerns regarding your invoice.   IF you received labwork today, you will receive an invoice from [facility]. Please contact [facility] at [phone] with questions or concerns regarding your invoice.   Our billing staff will not be able to assist you with questions regarding bills from these companies.  You will be contacted with the lab results as soon as they are available. The fastest way to get your results is to activate your My Chart account. Instructions are located on the last page of  this paperwork. If you have not heard from [place] regarding the results in 2 weeks, please contact this office.     Sinusitis, Adult Sinusitis is soreness and inflammation of your sinuses. Sinuses are hollow spaces in the bones around your face. They are located:  Around your eyes.  In the middle of your forehead.  Behind your nose.  In your cheekbones.  Your sinuses and nasal passages are lined with a stringy fluid (mucus). Mucus normally drains out of your sinuses. When your nasal tissues get inflamed or swollen, the mucus can get trapped or blocked so air cannot flow through your sinuses. This lets bacteria, viruses, and funguses grow, and that leads to infection. Follow these instructions at home: Medicines  Take, use, or apply over-the-counter and prescription medicines only as told by your doctor. These may include nasal sprays.  If you were prescribed an antibiotic medicine, take it as told by your doctor. Do not stop taking the antibiotic even if you start to feel better. Hydrate and Humidify  Drink enough water to keep your pee (urine) clear or pale yellow.  Use a cool mist humidifier to keep the humidity level in your home above 50%.  Breathe in steam for 10-15 minutes, 3-4 times a day or as told by  your doctor. You can do this in the bathroom while a hot shower is running.  Try not to spend time in cool or dry air. Rest  Rest as much as possible.  Sleep with your head raised (elevated).  Make sure to get enough sleep each night. General instructions  Put a warm, moist washcloth on your face 3-4 times a day or as told by your doctor. This will help with discomfort.  Wash your hands often with soap and water. If there is no soap and water, use hand sanitizer.  Do not smoke. Avoid being around people who are smoking (secondhand smoke).  Keep all follow-up visits as told by your doctor. This is important. Contact a doctor if:  You have a fever.  Your symptoms get  worse.  Your symptoms do not get better within 10 days. Get help right away if:  You have a very bad headache.  You cannot stop throwing up (vomiting).  You have pain or swelling around your face or eyes.  You have trouble seeing.  You feel confused.  Your neck is stiff.  You have trouble breathing. This information is not intended to replace advice given to you by your health care provider. Make sure you discuss any questions you have with your health care provider. Document Released: 01/31/2008 Document Revised: 04/09/2016 Document Reviewed: 06/09/2015 Elsevier Interactive Patient Education  2018 Elsevier Inc.      Agustina Caroli, MD [facility]

## 2017-02-19 NOTE — Patient Instructions (Addendum)
     IF you received an x-ray today, you will receive an invoice from [facility]. Please contact [facility] at [phone] with questions or concerns regarding your invoice.   IF you received labwork today, you will receive an invoice from [facility]. Please contact [facility] at [phone] with questions or concerns regarding your invoice.   Our billing staff will not be able to assist you with questions regarding bills from these companies.  You will be contacted with the lab results as soon as they are available. The fastest way to get your results is to activate your My Chart account. Instructions are located on the last page of this paperwork. If you have not heard from us regarding the results in 2 weeks, please contact this office.     Sinusitis, Adult Sinusitis is soreness and inflammation of your sinuses. Sinuses are hollow spaces in the bones around your face. They are located:  Around your eyes.  In the middle of your forehead.  Behind your nose.  In your cheekbones.  Your sinuses and nasal passages are lined with a stringy fluid (mucus). Mucus normally drains out of your sinuses. When your nasal tissues get inflamed or swollen, the mucus can get trapped or blocked so air cannot flow through your sinuses. This lets bacteria, viruses, and funguses grow, and that leads to infection. Follow these instructions at home: Medicines  Take, use, or apply over-the-counter and prescription medicines only as told by your doctor. These may include nasal sprays.  If you were prescribed an antibiotic medicine, take it as told by your doctor. Do not stop taking the antibiotic even if you start to feel better. Hydrate and Humidify  Drink enough water to keep your pee (urine) clear or pale yellow.  Use a cool mist humidifier to keep the humidity level in your home above 50%.  Breathe in steam for 10-15 minutes, 3-4 times a day or as told by your doctor. You can do  this in the bathroom while a hot shower is running.  Try not to spend time in cool or dry air. Rest  Rest as much as possible.  Sleep with your head raised (elevated).  Make sure to get enough sleep each night. General instructions  Put a warm, moist washcloth on your face 3-4 times a day or as told by your doctor. This will help with discomfort.  Wash your hands often with soap and water. If there is no soap and water, use hand sanitizer.  Do not smoke. Avoid being around people who are smoking (secondhand smoke).  Keep all follow-up visits as told by your doctor. This is important. Contact a doctor if:  You have a fever.  Your symptoms get worse.  Your symptoms do not get better within 10 days. Get help right away if:  You have a very bad headache.  You cannot stop throwing up (vomiting).  You have pain or swelling around your face or eyes.  You have trouble seeing.  You feel confused.  Your neck is stiff.  You have trouble breathing. This information is not intended to replace advice given to you by your health care provider. Make sure you discuss any questions you have with your health care provider. Document Released: 01/31/2008 Document Revised: 04/09/2016 Document Reviewed: 06/09/2015 Elsevier Interactive Patient Education  2018 Elsevier Inc.  

## 2017-02-21 ENCOUNTER — Other Ambulatory Visit: Payer: Self-pay | Admitting: Rheumatology

## 2017-02-21 ENCOUNTER — Other Ambulatory Visit: Payer: Self-pay | Admitting: Family Medicine

## 2017-02-21 NOTE — Telephone Encounter (Signed)
Last Visit: 09/20/16 Next Visit: 03/21/17  Okay to refill per Dr. Estanislado Pandy

## 2017-03-03 ENCOUNTER — Other Ambulatory Visit: Payer: Self-pay | Admitting: Rheumatology

## 2017-03-05 NOTE — Telephone Encounter (Signed)
Last Visit: 09/20/16 Next Visit: 03/21/17 Labs: 01/31/17 CBC WNL CMP- elevated glucose and ALT 50 previous ALT 36(labs drawn by PCP)   Okay to refill Fosamax?

## 2017-03-05 NOTE — Telephone Encounter (Signed)
ok 

## 2017-03-21 ENCOUNTER — Encounter: Payer: Self-pay | Admitting: Rheumatology

## 2017-03-21 ENCOUNTER — Ambulatory Visit (INDEPENDENT_AMBULATORY_CARE_PROVIDER_SITE_OTHER): Payer: 59 | Admitting: Rheumatology

## 2017-03-21 VITALS — BP 125/82 | HR 68 | Ht 65.0 in | Wt 177.0 lb

## 2017-03-21 DIAGNOSIS — G4700 Insomnia, unspecified: Secondary | ICD-10-CM

## 2017-03-21 DIAGNOSIS — M19042 Primary osteoarthritis, left hand: Secondary | ICD-10-CM | POA: Diagnosis not present

## 2017-03-21 DIAGNOSIS — M810 Age-related osteoporosis without current pathological fracture: Secondary | ICD-10-CM

## 2017-03-21 DIAGNOSIS — R5383 Other fatigue: Secondary | ICD-10-CM | POA: Diagnosis not present

## 2017-03-21 DIAGNOSIS — M797 Fibromyalgia: Secondary | ICD-10-CM | POA: Diagnosis not present

## 2017-03-21 DIAGNOSIS — M7711 Lateral epicondylitis, right elbow: Secondary | ICD-10-CM

## 2017-03-21 DIAGNOSIS — M19041 Primary osteoarthritis, right hand: Secondary | ICD-10-CM

## 2017-03-21 MED ORDER — DICLOFENAC SODIUM 1 % TD GEL: TRANSDERMAL | 3 refills | Status: DC

## 2017-03-21 NOTE — Progress Notes (Signed)
Office Visit Note  Patient: Dorothy [name]             Date of Birth: 04-22-1956           MRN: [id number]             PCP: [name], MD Referring: [name], MD Visit Date: 03/21/2017 Occupation: @GUAROCC @    Subjective:  Follow-up (6 month follow up fibromyalgia. Right elbow pain, no injury. )   History of Present Illness: Dorothy [name] is a 61 y.o. female  Who was last seen in our office on 09/20/2016 for fibromyalgia, disc disease, OA.   Right elbow pain since 4 months. No relief with exercise; xray at dr. Steffanie Dunn smith's office in may 2018 was negative; may have injured it as she grabbed on to shells and people when she nearly fell in the flower department at [employer] when her foot slipped on wet cement. No actual falls but patient did strain her shoulder elbow wrist and hands in the process. Those areas have all improved except for the right lateral elbow.  Patient rates her fiber myalgia discomfort as a 3 on a scale of 0-10. She continues to have some fatigue and some insomnia.  Activities of Daily Living:  Patient reports morning stiffness for 15 minutes.   Patient Reports nocturnal pain.  Difficulty dressing/grooming: Reports Difficulty climbing stairs: Denies Difficulty getting out of chair: Denies Difficulty using hands for taps, buttons, cutlery, and/or writing: Reports   Review of Systems  Constitutional: Positive for fatigue.  HENT: Negative for mouth sores and mouth dryness.   Eyes: Negative for dryness.  Respiratory: Negative for shortness of breath.   Gastrointestinal: Negative for constipation and diarrhea.  Musculoskeletal: Positive for myalgias and myalgias.  Skin: Negative for sensitivity to sunlight.  Psychiatric/Behavioral: Positive for sleep disturbance. Negative for decreased concentration.    PMFS History:  Patient Active Problem List   Diagnosis Date Noted  . Fibromyalgia 10/03/2012    Priority: High  . Degenerative disc  disease, cervical 09/20/2016  . Osteoporosis with current pathological fracture 06/19/2016  . Other secondary osteoarthritis of both hands 04/15/2016  . Osteoarthritis of pelvis 04/15/2016  . DDD (degenerative disc disease), lumbar 04/15/2016  . Neuropathy 04/15/2016  . IBS (irritable bowel syndrome) 04/15/2016  . Chronic diastolic heart failure ([place]) 06/14/2015  . Premature ventricular contractions 12/10/2013  . Abdominal pain, epigastric 10/01/2012  . DIARRHEA, CHRONIC 10/26/2009  . COLONIC POLYPS, ADENOMATOUS 09/21/2007  . DM2 (diabetes mellitus, type 2) ([place]) 09/21/2007  . HLD (hyperlipidemia) 09/21/2007  . Anxiety state 09/21/2007  . GERD 09/21/2007  . HIATAL HERNIA 09/21/2007  . CHOLEDOCHOLITHIASIS, HX OF 09/21/2007  . Acute gastritis 07/30/2007    Past Medical History:  Diagnosis Date  . Allergic rhinitis, cause unspecified   . Anxiety   . B12 deficiency   . Benign paroxysmal positional vertigo   . Choledocholithiasis   . Congestive heart failure ([place])   . Depression   . Diabetes mellitus   . Fatty liver   . Fatty liver 09/04/12  . Fibromyalgia   . Gastritis   . GERD (gastroesophageal reflux disease)   . Hematuria, unspecified   . Hiatal hernia   . Hx of adenomatous colonic polyps   . Hx of cardiovascular stress test    ETT-Myoview (12/15):  No ischemia.  No ECG changes.  EF 73%.  Low Risk.   [name] Hx of echocardiogram    Echo (12/15):  EF 60-65%, no  RWMA, normal diast function, mild RVE  . Hyperlipidemia   . Meniere's disease, unspecified   . Migraine   . Mononeuritis of unspecified site   . Osteoarthritis   . Osteoporosis, unspecified   . Otosclerosis, unspecified   . Peptic ulcer, unspecified site, unspecified as acute or chronic, without mention of hemorrhage, perforation, or obstruction   . Symptomatic menopausal or female climacteric states   . Unspecified disorder of kidney and ureter     Family History  Problem Relation Age of Onset  . Prostate cancer  Father   . Lung disease Father   . Diabetes Father   . Heart disease Father 9       CHF, AMI multiple/CABG  . COPD Father   . Heart attack Father   . Cancer Father        testicular cancer  . Diabetes Mother   . Hypertension Mother   . Hyperlipidemia Mother   . COPD Mother   . Cancer Mother        lung cancer  . Diabetes Brother   . Hypertension Brother   . Obesity Brother   . Hyperlipidemia Brother   . COPD Brother   . Stroke Maternal Grandmother   . Heart disease Maternal Grandfather   . COPD Paternal Grandmother   . Diabetes Paternal Grandmother   . COPD Paternal Grandfather   . Heart attack Paternal Grandfather   . Colon cancer Cousin        died age 25  . Breast cancer Maternal Aunt    Past Surgical History:  Procedure Laterality Date  . BILE DUCT EXPLORATION     gallstone removed  . BREAST ENHANCEMENT SURGERY Bilateral   . BUNIONECTOMY Left 12/2013  . CHOLECYSTECTOMY    . FOOT SURGERY     right  . 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 MOUTH SURGERY    . SHOULDER SURGERY     left  . TOTAL ABDOMINAL HYSTERECTOMY     pelvic pain/scar tissue; ovaries intact.  . TUBAL LIGATION     Social History   Social History Narrative      Marital status:  Married x 21 years; happily married,no abuse. 2nd marriage.      Children: 3 sons (41, 26, 37)---2 sons with substance abuse; 3 grandchildren; 3 step grandchildren; 2 gg.      Lives: with husband, son/Brian.      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      Drugs:none      Exercise: exercising at gym in 2017; 2 days per week; walking and weight lifting.      Seatbelt: 100%; no texting      Guns: none      Always uses seat belts. Smoke alarm and  carbon monoxide detector in the home.No caffeine use. No unsecured guns in the home.      Objective: Vital Signs: BP 125/82 (BP Location: Left Arm, Patient Position: Sitting, Cuff Size: Normal)   Pulse 68   Ht 5\' 5"  (1.651 m)   Wt 177 lb (80.3 kg)   BMI 29.45 kg/m    Physical Exam  Constitutional: She is oriented to person, place, and time. She appears well-developed  and well-nourished.  HENT:  Head: Normocephalic and atraumatic.  Eyes: Pupils are equal, round, and reactive to light. EOM are normal.  Cardiovascular: Normal rate, regular rhythm and normal heart sounds.  Exam reveals no gallop and no friction rub.   No murmur heard. Pulmonary/Chest: Effort normal and breath sounds normal. She has no wheezes. She has no rales.  Abdominal: Soft. Bowel sounds are normal. She exhibits no distension. There is no tenderness. There is no guarding. No hernia.  Musculoskeletal: Normal range of motion. She exhibits no edema, tenderness or deformity.  Lymphadenopathy:    She has no cervical adenopathy.  Neurological: She is alert and oriented to person, place, and time. Coordination normal.  Skin: Skin is warm and dry. Capillary refill takes less than 2 seconds. No rash noted.  Psychiatric: She has a normal mood and affect. Her behavior is normal.  Nursing note and vitals reviewed.    Musculoskeletal Exam:  Full range of motion of all joints except Has pain to the right lateral elbow with supination and pronation of the right elbow. Decreased grip strength on the right secondary to pain of the right lateral elbow epicondyle. Normal grip strength on the last For most tender points are 18 out of 18 positive  CDAI Exam: No CDAI exam completed.  3 on a scale of 0-10  Investigation: No additional findings. Office Visit on 01/31/2017  Component Date Value Ref Range Status  . WBC 01/31/2017 4.8  3.4 - 10.8 x10E3/uL Final  . RBC 01/31/2017 4.60  3.77 - 5.28 x10E6/uL Final  . Hemoglobin  01/31/2017 14.1  11.1 - 15.9 g/dL Final  . Hematocrit 01/31/2017 42.6  34.0 - 46.6 % Final  . MCV 01/31/2017 93  79 - 97 fL Final  . MCH 01/31/2017 30.7  26.6 - 33.0 pg Final  . MCHC 01/31/2017 33.1  31.5 - 35.7 g/dL Final  . RDW 01/31/2017 13.4  12.3 - 15.4 % Final  . Platelets 01/31/2017 214  150 - 379 x10E3/uL Final  . Neutrophils 01/31/2017 65  Not Estab. % Final  . Lymphs 01/31/2017 27  Not Estab. % Final  . Monocytes 01/31/2017 6  Not Estab. % Final  . Eos 01/31/2017 2  Not Estab. % Final  . Basos 01/31/2017 0  Not Estab. % Final  . Neutrophils Absolute 01/31/2017 3.1  1.4 - 7.0 x10E3/uL Final  . Lymphocytes Absolute 01/31/2017 1.3  0.7 - 3.1 x10E3/uL Final  . Monocytes Absolute 01/31/2017 0.3  0.1 - 0.9 x10E3/uL Final  . EOS (ABSOLUTE) 01/31/2017 0.1  0.0 - 0.4 x10E3/uL Final  . Basophils Absolute 01/31/2017 0.0  0.0 - 0.2 x10E3/uL Final  . Immature Granulocytes 01/31/2017 0  Not Estab. % Final  . Immature Grans (Abs) 01/31/2017 0.0  0.0 - 0.1 x10E3/uL Final  . Glucose 01/31/2017 160* 65 - 99 mg/dL Final  . BUN 01/31/2017 18  8 - 27 mg/dL Final  . Creatinine, Ser 01/31/2017 0.90  0.57 - 1.00 mg/dL Final  . GFR calc non Af Amer 01/31/2017 70  >59 mL/min/1.73 Final  . GFR calc Af Amer 01/31/2017 80  >59 mL/min/1.73 Final  . BUN/Creatinine Ratio 01/31/2017 20  12 - 28 Final  . Sodium 01/31/2017 137  134 - 144 mmol/L Final  . Potassium 01/31/2017 4.1  3.5 - 5.2 mmol/L Final  . Chloride 01/31/2017 97  96 - 106 mmol/L Final  . CO2 01/31/2017 22  18 - 29 mmol/L Final   Comment: **Effective February 05, 2017 Carbon  Dioxide, Total**   reference interval will be changing to:              Age                  Female          Female      0 days   - 30 days         27 - 92        16 - 75     31 days   -  1 year         15 - 37        15 - 25      2 years  -  5 years        26 - 55        17 - 79      6 years  - 78 years        74 - 13        19 - 68                >12 years        54 - 54         20 - 46   . Calcium 01/31/2017 9.5  8.7 - 10.3 mg/dL Final  . Total Protein 01/31/2017 6.6  6.0 - 8.5 g/dL Final  . Albumin 01/31/2017 4.4  3.6 - 4.8 g/dL Final  . Globulin, Total 01/31/2017 2.2  1.5 - 4.5 g/dL Final  . Albumin/Globulin Ratio 01/31/2017 2.0  1.2 - 2.2 Final  . Bilirubin Total 01/31/2017 0.4  0.0 - 1.2 mg/dL Final  . Alkaline Phosphatase 01/31/2017 69  39 - 117 IU/L Final  . AST 01/31/2017 38  0 - 40 IU/L Final  . ALT 01/31/2017 50* 0 - 32 IU/L Final  . Hgb A1c MFr Bld 01/31/2017 7.4* 4.8 - 5.6 % Final   Comment:          Pre-diabetes: 5.7 - 6.4          Diabetes: >6.4          Glycemic control for adults with diabetes: <7.0   . Est. average glucose Bld gHb Est-m* 01/31/2017 166  mg/dL Final  . Cholesterol, Total 01/31/2017 195  100 - 199 mg/dL Final  . Triglycerides 01/31/2017 129  0 - 149 mg/dL Final  . HDL 01/31/2017 74  >39 mg/dL Final  . VLDL Cholesterol Cal 01/31/2017 26  5 - 40 mg/dL Final  . LDL Calculated 01/31/2017 95  0 - 99 mg/dL Final  . Chol/HDL Ratio 01/31/2017 2.6  0.0 - 4.4 ratio Final   Comment:                                   T. Chol/HDL Ratio                                             Men  Women                               1/2 Avg.Risk  3.4    3.3  Avg.Risk  5.0    4.4                                2X Avg.Risk  9.6    7.1                                3X Avg.Risk 23.4   11.0   Office Visit on 10/25/2016  Component Date Value Ref Range Status  . Color, UA 10/25/2016 yellow  yellow Final  . Clarity, UA 10/25/2016 clear  clear Final  . Glucose, UA 10/25/2016 negative  negative Final  . Bilirubin, UA 10/25/2016 negative  negative Final  . Ketones, POC UA 10/25/2016 negative  negative Final  . Spec Grav, UA 10/25/2016 1.025   Final  . Blood, UA 10/25/2016 negative  negative Final  . pH, UA 10/25/2016 6.0   Final  . Protein Ur, POC 10/25/2016 trace* negative Final  . Urobilinogen, UA 10/25/2016 0.2    Final  . Nitrite, UA 10/25/2016 Negative  Negative Final  . Leukocytes, UA 10/25/2016 Negative  Negative Final  . POC Glucose 10/25/2016 149* 70 - 99 mg/dl Final  . Hemoglobin A1C 10/25/2016 7.1   Final  . Cholesterol, Total 10/25/2016 176  100 - 199 mg/dL Final  . Triglycerides 10/25/2016 81  0 - 149 mg/dL Final  . HDL 10/25/2016 75  >39 mg/dL Final  . VLDL Cholesterol Cal 10/25/2016 16  5 - 40 mg/dL Final  . LDL Calculated 10/25/2016 85  0 - 99 mg/dL Final  . Chol/HDL Ratio 10/25/2016 2.3  0.0 - 4.4 ratio units Final   Comment:                                   T. Chol/HDL Ratio                                             Men  Women                               1/2 Avg.Risk  3.4    3.3                                   Avg.Risk  5.0    4.4                                2X Avg.Risk  9.6    7.1                                3X Avg.Risk 23.4   11.0   . Glucose 10/25/2016 150* 65 - 99 mg/dL Final  . BUN 10/25/2016 23  8 - 27 mg/dL Final  . Creatinine, Ser 10/25/2016 1.00  0.57 - 1.00 mg/dL Final  . GFR calc non Af Amer 10/25/2016 61  >59 mL/min/1.73 Final  . GFR calc Af Amer 10/25/2016 71  >59 mL/min/1.73 Final  .  BUN/Creatinine Ratio 10/25/2016 23  12 - 28 Final  . Sodium 10/25/2016 141  134 - 144 mmol/L Final  . Potassium 10/25/2016 4.1  3.5 - 5.2 mmol/L Final  . Chloride 10/25/2016 98  96 - 106 mmol/L Final  . CO2 10/25/2016 24  18 - 29 mmol/L Final  . Calcium 10/25/2016 9.1  8.7 - 10.3 mg/dL Final  . Total Protein 10/25/2016 6.5  6.0 - 8.5 g/dL Final  . Albumin 10/25/2016 4.6  3.6 - 4.8 g/dL Final  . Globulin, Total 10/25/2016 1.9  1.5 - 4.5 g/dL Final  . Albumin/Globulin Ratio 10/25/2016 2.4* 1.2 - 2.2 Final  . Bilirubin Total 10/25/2016 0.5  0.0 - 1.2 mg/dL Final  . Alkaline Phosphatase 10/25/2016 53  39 - 117 IU/L Final  . AST 10/25/2016 19  0 - 40 IU/L Final  . ALT 10/25/2016 36* 0 - 32 IU/L Final  . WBC 10/25/2016 4.3  3.4 - 10.8 x10E3/uL Final  . RBC 10/25/2016 4.46   3.77 - 5.28 x10E6/uL Final  . Hemoglobin 10/25/2016 14.1  11.1 - 15.9 g/dL Final  . Hematocrit 10/25/2016 41.2  34.0 - 46.6 % Final  . MCV 10/25/2016 92  79 - 97 fL Final  . MCH 10/25/2016 31.6  26.6 - 33.0 pg Final  . MCHC 10/25/2016 34.2  31.5 - 35.7 g/dL Final  . RDW 10/25/2016 13.7  12.3 - 15.4 % Final  . Platelets 10/25/2016 204  150 - 379 x10E3/uL Final  . Neutrophils 10/25/2016 59  Not Estab. % Final  . Lymphs 10/25/2016 32  Not Estab. % Final  . Monocytes 10/25/2016 7  Not Estab. % Final  . Eos 10/25/2016 1  Not Estab. % Final  . Basos 10/25/2016 1  Not Estab. % Final  . Neutrophils Absolute 10/25/2016 2.6  1.4 - 7.0 x10E3/uL Final  . Lymphocytes Absolute 10/25/2016 1.4  0.7 - 3.1 x10E3/uL Final  . Monocytes Absolute 10/25/2016 0.3  0.1 - 0.9 x10E3/uL Final  . EOS (ABSOLUTE) 10/25/2016 0.0  0.0 - 0.4 x10E3/uL Final  . Basophils Absolute 10/25/2016 0.0  0.0 - 0.2 x10E3/uL Final  . Immature Granulocytes 10/25/2016 0  Not Estab. % Final  . Immature Grans (Abs) 10/25/2016 0.0  0.0 - 0.1 x10E3/uL Final     Imaging: No results found.     Speciality Comments: No specialty comments available.    Procedures:  No procedures performed Allergies: Bactrim [sulfamethoxazole-trimethoprim]; Keflex [cephalexin]; Macrobid [nitrofurantoin macrocrystal]; Nitrofurantoin; Sulfa drugs cross reactors; Adhesive [tape]; Codeine; and Talwin [pentazocine]   Assessment / Plan:     Visit Diagnoses: Fibromyalgia  Other fatigue  Insomnia, unspecified type  Osteoarthritis of both hands, unspecified osteoarthritis type  Osteoporosis, unspecified osteoporosis type, unspecified pathological fracture presence   Plan: #1: Fibromyalgia. Patient rates her discomfort as 3 on a scale of 0-10. She we'll do fibromyalgia water aerobics classes. She will return the exercises and then exercise in her own pool. She notes that doing water exercises in the pool currently is giving her good relief with her  fiber.  #2: Fatigue and insomnia. Ongoing.  #3: Right lateral epicondylitis; injured it approximately 3-4 months ago. Saw PCP. X-ray was done and was negative. The pain is somewhat better but not entirely resolved. I gave the patient a prescription for right lateral epicondylar brace, advised her to use Voltaren gel, can put ice at times, and gave her exercises. If no better, patient may want to have PCP consider doing further workup.  #4: Return to  clinic in 5 months  #5: Refill on Voltaren gel; I advised patient how to use it properly. Dispense 5 tubes with 3 refills  Orders: No orders of the defined types were placed in this encounter.  No orders of the defined types were placed in this encounter.   Face-to-face time spent with patient was 30 minutes. 50% of time was spent in counseling and coordination of care.  Follow-Up Instructions: No Follow-up on file.   Eliezer Lofts, PA-C  Note - This record has been created using [employer].  Chart creation errors have been sought, but may not always  have been located. Such creation errors do not reflect on  the standard of medical care.

## 2017-03-21 NOTE — Patient Instructions (Signed)
[facility] Elbow Rehab  Ask your health care provider which exercises are safe for you. Do exercises exactly as told by your health care provider and adjust them as directed. It is normal to feel mild stretching, pulling, tightness, or discomfort as you do these exercises, but you should stop right away if you feel sudden pain or your pain gets worse. Do not begin these exercises until told by your health care provider.  Stretching and range of motion exercises  These exercises warm up your muscles and joints and improve the movement and flexibility of your elbow. These exercises also help to relieve pain, numbness, and tingling.  Exercise A: Wrist extensor stretch  1. Extend your left / right elbow with your fingers pointing down.  2. Gently pull the palm of your left / right hand toward you until you feel a gentle stretch on the top of your forearm.  3. To increase the stretch, push your left / right hand toward the outer edge or pinkie side of your forearm.  4. Hold this position for __________ seconds.  Repeat __________ times. Complete this exercise __________ times a day.  If directed by your health care provider, repeat this stretch except do it with a bent elbow this time.  Exercise B: Wrist flexor stretch    1. Extend your left / right elbow and turn your palm upward.  2. Gently pull your left / right palm and fingertips back so your wrist extends and your fingers point more toward the ground.  3. You should feel a gentle stretch on the inside of your forearm.  4. Hold this position for __________ seconds.  Repeat __________ times. Complete this exercise __________ times a day.  If directed by your health care provider, repeat this stretch except do it with a bent elbow this time.  Strengthening exercises  These exercises build strength and endurance in your elbow. Endurance is the ability to use your muscles for a long time, even after they get tired.  Exercise C: Wrist extensors    1. Sit with your left /  right forearm palm-down and fully supported on a table or countertop. Your elbow should be resting below the height of your shoulder.  2. Let your left / right wrist extend over the edge of the surface.  3. Loosely hold a __________ weight or a piece of rubber exercise band or tubing in your left / right hand. Slowly curl your left / right hand up toward your forearm. If you are using band or tubing, hold the band or tubing in place with your other hand to provide resistance.  4. Hold this position for __________ seconds.  5. Slowly return to the starting position.  Repeat __________ times. Complete this exercise __________ times a day.  Exercise D: Radial deviators    1. Stand with a __________ weight in your left / righthand. Or, sit while holding a rubber exercise band or tubing with your other arm supported on a table or countertop. Position your hand so your thumb is on top.  2. Raise your hand upward in front of you so your thumb travels toward your forearm, or pull up on the rubber tubing.  3. Hold this position for __________ seconds.  4. Slowly return to the starting position.  Repeat __________ times. Complete this exercise __________ times a day.  Exercise E: Eccentric wrist extensors  1. Sit with your left / right forearm palm-down and fully supported on a table or countertop. Your   elbow should be resting below the height of your shoulder.  2. If told by your health care provider, hold a __________ weight in your hand.  3. Let your left / right wrist extend over the edge of the surface.  4. Use your other hand to lift up your left / right hand toward your forearm. Keep your forearm on the table.  5. Using only the muscles in your left / right hand, slowly lower your hand back down to the starting position.  Repeat __________ times. Complete this exercise __________ times a day.  This information is not intended to replace advice given to you by your health care provider. Make sure you discuss any  questions you have with your health care provider.  Document Released: 08/14/2005 Document Revised: 04/19/2016 Document Reviewed: 05/13/2015  [employer] Interactive Patient Education  2018 [employer]

## 2017-04-01 NOTE — Progress Notes (Signed)
Cardiology Office Note:    Date:  04/02/2017   ID:  Dorothy [name], DOB 1956/06/14, MRN [id number]  PCP:  [name], MD  Cardiologist:  Dr. Burt Knack  Referring MD: [name], MD   Chief Complaint  Patient presents with  . Follow-up    History of Present Illness:    Dorothy [name] is a 61 y.o. female with a past medical history significant for Type 2 diabetes, hyperlipidemia, chronic diastolic CHF and Mnire's disease.  Patient was last seen in the office on 04/11/16 at which time she was doing fairly well. The patient last had a stress test on 08/17/2014 which was low risk. Her last echocardiogram was on 08/04/14 that showed normal LV systolic function with EF 60-65%, normal wall motion, normal diastolic function.  The patient was recently seen on 02/18/17 in the emergency department for dizziness probably related to Mnire's disease in the setting of GI illness. She then saw her PCP on 6/25 and was diagnosed with sinusitis and given antibiotic, prednisone and Flonase. She completely recovered. She sees rheumatology for fibromyalgia.  The patient is here today alone. She is feeling well without any chest pain or tightness or any heart failure symptoms like orthopnea, PND, edema like she had when she was diagnosed with heart failure. She does have shortness of breath with climbing an incline as she had last year at this time, no worse at present. She exercises daily with swimming in her pool and walks about a mile one to 2 times per week without exertional symptoms. She was on Lasix for heart failure however this caused a reaction of skin burning, no rash. The symptoms were significant and ceased abruptly when she stopped the Lasix. She is currently on hydrochlorothiazide for treatment of Mnire's disease. She also is diligent about limiting her sodium intake. He has regular follow-up with her PCP, Dr. Reginia Forts.    Past Medical History:  Diagnosis Date  . Allergic rhinitis,  cause unspecified   . Anxiety   . B12 deficiency   . Benign paroxysmal positional vertigo   . Choledocholithiasis   . Congestive heart failure ([place])   . Depression   . Diabetes mellitus   . Fatty liver   . Fatty liver 09/04/12  . Fibromyalgia   . Gastritis   . GERD (gastroesophageal reflux disease)   . Hematuria, unspecified   . Hiatal hernia   . Hx of adenomatous colonic polyps   . Hx of cardiovascular stress test    ETT-Myoview (12/15):  No ischemia.  No ECG changes.  EF 73%.  Low Risk.   [name] Hx of echocardiogram    Echo (12/15):  EF 60-65%, no RWMA, normal diast function, mild RVE  . Hyperlipidemia   . Meniere's disease, unspecified   . Migraine   . Mononeuritis of unspecified site   . Osteoarthritis   . Osteoporosis, unspecified   . Otosclerosis, unspecified   . Peptic ulcer, unspecified site, unspecified as acute or chronic, without mention of hemorrhage, perforation, or obstruction   . Symptomatic menopausal or female climacteric states   . Unspecified disorder of kidney and ureter     Past Surgical History:  Procedure Laterality Date  . BILE DUCT EXPLORATION     gallstone removed  . BREAST ENHANCEMENT SURGERY Bilateral   . BUNIONECTOMY Left 12/2013  . CHOLECYSTECTOMY    . FOOT SURGERY     right  . 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 MOUTH SURGERY    . SHOULDER SURGERY     left  . TOTAL ABDOMINAL HYSTERECTOMY     pelvic pain/scar tissue; ovaries intact.  . TUBAL LIGATION      Current Medications: Current Meds  Medication Sig  . alendronate (FOSAMAX) 70 MG tablet TAKE ONE TABLET BY MOUTH ONCE WEEKLY  . aspirin 81 MG tablet Take 81 mg by mouth every evening.  . Calcium Carb-Cholecalciferol (CALCIUM 500 +D) 500-400 MG-UNIT TABS Take 1 tablet by mouth daily.   . Cholecalciferol  (VITAMIN D3) 2000 UNITS TABS Take 2,000 Units by mouth daily.   [name] DEXILANT 60 MG capsule TAKE ONE CAPSULE BY MOUTH DAILY  . fluticasone (FLONASE) 50 MCG/ACT nasal spray Place 2 sprays into both nostrils daily.  [name] gabapentin (NEURONTIN) 300 MG capsule TAKE ONE CAPSULE BY MOUTH THREE TIMES A DAY  . hydrochlorothiazide (HYDRODIURIL) 25 MG tablet TAKE ONE TABLET BY MOUTH DAILY  . JANUVIA 100 MG tablet TAKE ONE TABLET BY MOUTH DAILY  . losartan (COZAAR) 50 MG tablet TAKE 1 TABLET (50 MG TOTAL) BY MOUTH DAILY.  . metaxalone (SKELAXIN) 800 MG tablet Take 800 mg by mouth 3 (three) times daily.  . metFORMIN (GLUCOPHAGE) 1000 MG tablet TAKE ONE TABLET BY MOUTH TWICE A DAY WITH MEALS  . omega-3 acid ethyl esters (LOVAZA) 1 g capsule Take 1 capsule (1 g total) by mouth 2 (two) times daily.  . ondansetron (ZOFRAN) 4 MG tablet Take 1 tablet (4 mg total) by mouth every 6 (six) hours.  . Potassium Chloride ER 20 MEQ TBCR Take 2 tablets (40 meQ) by mouth daily  . pramipexole (MIRAPEX) 0.125 MG tablet Take 2 tablets (0.25 mg total) by mouth at bedtime.  [name] PREVALITE 4 g packet TAKE 1 PACKET (4G TOTAL) BY MOUTH TWO TIMES A DAY  . simvastatin (ZOCOR) 40 MG tablet Take 1 tablet (40 mg total) by mouth at bedtime.  [name] venlafaxine XR (EFFEXOR-XR) 75 MG 24 hr capsule Take 1 capsule (75 mg total) by mouth every evening.  [name] VOLTAREN 1 % GEL Apply 2 g topically 3 (three) times daily as needed (pain).      Allergies:   Bactrim [sulfamethoxazole-trimethoprim]; Keflex [cephalexin]; Macrobid [nitrofurantoin macrocrystal]; Nitrofurantoin; Sulfa drugs cross reactors; Adhesive [tape]; Codeine; and Talwin [pentazocine]   Social History   Social History  . Marital status: Married    Spouse name: Richardson Landry  . Number of children: 3  . Years of education: 10th grade   Occupational History  .  [employer]   Social History Main Topics  . Smoking status: Never Smoker  . Smokeless tobacco: Never Used  . Alcohol  use 0.0 oz/week     Comment: occasional 1 x month  . Drug use: No  . Sexual activity: Yes    Birth control/ protection: Post-menopausal, Surgical   Other Topics Concern  . None   Social History Narrative      Marital status:  Married x 21 years; happily married,no abuse. 2nd marriage.      Children: 3 sons (41, 82, 37)---2 sons with substance abuse; 3 grandchildren; 3 step grandchildren; 2 gg.      Lives: with husband, son/Brian.      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      Drugs:none      Exercise: exercising at gym in 2017; 2 days per week; walking and weight lifting.      Seatbelt: 100%; no texting      Guns: none      Always uses seat belts. Smoke alarm and carbon monoxide detector in the home.No caffeine use. No unsecured guns in the home.      Family History: The patient's family history includes Breast cancer in her maternal aunt; COPD in her brother, father, mother, paternal grandfather, and paternal grandmother; Cancer in her father and mother; Colon cancer in her cousin; Diabetes in her brother, father, mother, and paternal grandmother; Heart attack in her father and paternal grandfather; Heart disease in her maternal grandfather; Heart disease (age of onset: 43) in her father; Hyperlipidemia in her brother and mother; Hypertension in her brother and mother; Lung disease in her father; Obesity in her brother; Prostate cancer in her father; Stroke in her maternal grandmother. ROS:   Please see the history of present illness.     All other systems reviewed and are negative.  EKGs/Labs/Other Studies Reviewed:    The following studies were reviewed today:  ETT-Myoview (12/15): Overall Impression: Low risk stress nuclear study. Poor exercise tolerance. Patient reached her target heart rate quickly. No ischemia by perfusion imaging. Normal LV systolic function with no wall motion  abnormalities. LV Ejection Fraction: 73%.  Echocardiogram 08/04/2014 Study Conclusions  - Left ventricle: The cavity size was normal. Wall thickness was normal. Systolic function was normal. The estimated ejection fraction was in the range of 60% to 65%. Wall motion was normal; there were no regional wall motion abnormalities. Left ventricular diastolic function parameters were normal. - Aortic valve: Structurally normal valve. Trileaflet; normal thickness leaflets. Transvalvular velocity was within the normal range. There was no stenosis. There was no regurgitation. - Aortic root: The aortic root was normal in size. - Ascending aorta: The ascending aorta was normal in size. - Mitral valve: Structurally normal valve. There was trivial regurgitation. - Right ventricle: The cavity size was mildly dilated. Wall thickness was normal. - Right atrium: The atrium was normal in size. - Tricuspid valve: There was no regurgitation. - Pulmonary arteries: Systolic pressure was within the normal range. - Inferior vena cava: The vessel was normal in size. The respirophasic diameter changes were in the normal range (>= 50%), consistent with normal central venous pressure. - Pericardium, extracardiac: There was no pericardial effusion.  Impressions: - Normal study.  EKG:  EKG is ordered today.  The ekg ordered today demonstrates sinus rhythm at 83 bpm  Recent Labs: 04/18/2016: TSH 3.58 01/31/2017: ALT 50; BUN 18; Creatinine, Ser 0.90; Hemoglobin 14.1; Platelets 214; Potassium 4.1; Sodium 137   Recent Lipid Panel    Component Value Date/Time   CHOL 195 01/31/2017 1149   TRIG 129 01/31/2017 1149   HDL 74 01/31/2017 1149   CHOLHDL 2.6 01/31/2017 1149   CHOLHDL 3.7 07/26/2016 1046   VLDL 33 (H) 07/26/2016 1046   LDLCALC 95 01/31/2017 1149    Physical Exam:    VS:  BP 134/70   Pulse 83   Ht 5\' 5"  (1.651 m)   Wt 177 lb (80.3 kg)   BMI 29.45 kg/m     Wt Readings  from Last 3 Encounters:  04/02/17 177 lb (80.3 kg)  03/21/17 177 lb (80.3 kg)  02/19/17 174 lb 3.2 oz (79 kg)    Physical Exam  Constitutional:  She is oriented to person, place, and time. She appears well-developed and well-nourished. No distress.  HENT:  Head: Normocephalic and atraumatic.  Neck: Normal range of motion. Neck supple. No JVD present. Carotid bruit is not present.  Cardiovascular: Normal rate, regular rhythm and normal heart sounds.  Exam reveals no gallop and no friction rub.   No murmur heard. Pulmonary/Chest: Effort normal and breath sounds normal. No respiratory distress. She has no wheezes. She has no rales.  Abdominal: Soft. There is no tenderness.  Musculoskeletal: Normal range of motion. She exhibits no edema.  Neurological: She is alert and oriented to person, place, and time.  Skin: Skin is warm and dry.  Psychiatric: She has a normal mood and affect. Her behavior is normal.    ASSESSMENT:    1. Pure hypercholesterolemia   2. Chronic diastolic heart failure (HCC)    PLAN:    In order of problems listed above:  1. Chronic diastolic CHF -Patient has had no heart failure symptoms since her initial diagnosis. She is doing well today without any orthopnea, PND, edema or significant DOE. -Patient is off Lasix due to it causing her skin burning. She is on hydrochlorothiazide as treatment for Mnire's disease and concomitant CHF. Also continues on losartan without problems. -Patient is diligent about limiting the sodium in her diet -Continue current therapy.  2. Hyperlipidemia -Lipid panel on 01/31/17 shows LDL 95. -And tinea current statin  3. Type 2 diabetes -Hemoglobin A1c was 7.4 on 01/31/17. Discussed goal of A1c less than 7 and pt is working on improvements.  -Management per PCP, Dr. Reginia Forts   Medication Adjustments/Labs and Tests Ordered: Current medicines are reviewed at length with the patient today.  Concerns regarding medicines are outlined  above. Labs and tests ordered and medication changes are outlined in the patient instructions below:  Patient Instructions  Medication Instructions:  1. Your physician recommends that you continue on your current medications as directed. Please refer to the Current Medication list given to you today.   Labwork: NONE ORDERED   Testing/Procedures: NONE ORDERED   Follow-Up: Your physician wants you to follow-up in: [place] will receive a reminder letter in the mail two months in advance. If you don't receive a letter, please call our office to schedule the follow-up appointment.   Any Other Special Instructions Will Be Listed Below (If Applicable).     If you need a refill on your cardiac medications before your next appointment, please call your pharmacy.      Signed, Daune Perch, NP  04/02/2017 2:33 PM    [facility]

## 2017-04-02 ENCOUNTER — Ambulatory Visit (INDEPENDENT_AMBULATORY_CARE_PROVIDER_SITE_OTHER): Payer: 59 | Admitting: Cardiology

## 2017-04-02 ENCOUNTER — Encounter: Payer: Self-pay | Admitting: Physician Assistant

## 2017-04-02 VITALS — BP 134/70 | HR 83 | Ht 65.0 in | Wt 177.0 lb

## 2017-04-02 DIAGNOSIS — E1121 Type 2 diabetes mellitus with diabetic nephropathy: Secondary | ICD-10-CM

## 2017-04-02 DIAGNOSIS — I5032 Chronic diastolic (congestive) heart failure: Secondary | ICD-10-CM

## 2017-04-02 DIAGNOSIS — E7800 Pure hypercholesterolemia, unspecified: Secondary | ICD-10-CM | POA: Diagnosis not present

## 2017-04-02 NOTE — Patient Instructions (Signed)
Medication Instructions:  1. Your physician recommends that you continue on your current medications as directed. Please refer to the Current Medication list given to you today.   Labwork: NONE ORDERED   Testing/Procedures: NONE ORDERED   Follow-Up: Your physician wants you to follow-up in: [place] will receive a reminder letter in the mail two months in advance. If you don't receive a letter, please call our office to schedule the follow-up appointment.   Any Other Special Instructions Will Be Listed Below (If Applicable).     If you need a refill on your cardiac medications before your next appointment, please call your pharmacy.

## 2017-04-04 ENCOUNTER — Other Ambulatory Visit: Payer: Self-pay | Admitting: Family Medicine

## 2017-04-04 ENCOUNTER — Other Ambulatory Visit: Payer: Self-pay | Admitting: Rheumatology

## 2017-04-04 NOTE — Telephone Encounter (Signed)
Last Visit: 03/21/17 Next Visit: 09/05/16 Labs: 01/31/17 cbc wnl, cmp elevated glucose and ALT 50 previous ALT 36 (labs drawn by PCP)  Okay to refill Fosamax?

## 2017-04-04 NOTE — Telephone Encounter (Signed)
ok 

## 2017-04-10 ENCOUNTER — Other Ambulatory Visit: Payer: Self-pay | Admitting: Family Medicine

## 2017-04-11 ENCOUNTER — Telehealth: Payer: Self-pay | Admitting: Radiology

## 2017-04-11 NOTE — Telephone Encounter (Signed)
Called pt to let her know refill requested, does she need this ? Left message for her to call me back

## 2017-04-11 NOTE — Telephone Encounter (Signed)
Refill request received via fax for prednisone taper will call pt to see if this needs refill

## 2017-04-29 ENCOUNTER — Other Ambulatory Visit: Payer: Self-pay | Admitting: Rheumatology

## 2017-05-01 ENCOUNTER — Other Ambulatory Visit: Payer: Self-pay | Admitting: Family Medicine

## 2017-05-01 NOTE — Telephone Encounter (Signed)
Last Visit: 03/21/17 Next Visit: 09/05/16 Labs: 01/31/17 cbc wnl, cmp elevated glucose and ALT 50 previous ALT 36 (labs drawn by PCP)  Okay to refill per Dr. Estanislado Pandy

## 2017-05-08 ENCOUNTER — Other Ambulatory Visit: Payer: Self-pay | Admitting: Family Medicine

## 2017-05-08 ENCOUNTER — Other Ambulatory Visit: Payer: Self-pay | Admitting: Cardiovascular Disease

## 2017-05-09 ENCOUNTER — Ambulatory Visit (INDEPENDENT_AMBULATORY_CARE_PROVIDER_SITE_OTHER): Payer: 59 | Admitting: Family Medicine

## 2017-05-09 ENCOUNTER — Encounter: Payer: Self-pay | Admitting: Family Medicine

## 2017-05-09 VITALS — BP 130/82 | HR 65 | Temp 98.0°F | Resp 16 | Ht 64.96 in | Wt 174.0 lb

## 2017-05-09 DIAGNOSIS — Z23 Encounter for immunization: Secondary | ICD-10-CM | POA: Diagnosis not present

## 2017-05-09 DIAGNOSIS — K580 Irritable bowel syndrome with diarrhea: Secondary | ICD-10-CM

## 2017-05-09 DIAGNOSIS — M797 Fibromyalgia: Secondary | ICD-10-CM

## 2017-05-09 DIAGNOSIS — E1121 Type 2 diabetes mellitus with diabetic nephropathy: Secondary | ICD-10-CM

## 2017-05-09 DIAGNOSIS — Z0000 Encounter for general adult medical examination without abnormal findings: Secondary | ICD-10-CM

## 2017-05-09 DIAGNOSIS — M8000XS Age-related osteoporosis with current pathological fracture, unspecified site, sequela: Secondary | ICD-10-CM

## 2017-05-09 DIAGNOSIS — J301 Allergic rhinitis due to pollen: Secondary | ICD-10-CM | POA: Diagnosis not present

## 2017-05-09 DIAGNOSIS — K219 Gastro-esophageal reflux disease without esophagitis: Secondary | ICD-10-CM | POA: Diagnosis not present

## 2017-05-09 DIAGNOSIS — I5032 Chronic diastolic (congestive) heart failure: Secondary | ICD-10-CM

## 2017-05-09 DIAGNOSIS — E785 Hyperlipidemia, unspecified: Secondary | ICD-10-CM

## 2017-05-09 LAB — POCT URINALYSIS DIP (MANUAL ENTRY)
BILIRUBIN UA: NEGATIVE mg/dL
Bilirubin, UA: NEGATIVE
GLUCOSE UA: NEGATIVE mg/dL
LEUKOCYTES UA: NEGATIVE
Nitrite, UA: NEGATIVE
SPEC GRAV UA: 1.025 (ref 1.010–1.025)
Urobilinogen, UA: 0.2 E.U./dL
pH, UA: 6 (ref 5.0–8.0)

## 2017-05-09 MED ORDER — METFORMIN HCL 500 MG PO TABS: 500.0000 mg | ORAL_TABLET | Freq: Two times a day (BID) | ORAL | 3 refills | Status: DC

## 2017-05-09 MED ORDER — EMPAGLIFLOZIN 25 MG PO TABS: 25.0000 mg | ORAL_TABLET | Freq: Every day | ORAL | 3 refills | Status: DC

## 2017-05-09 MED ORDER — ZOSTER VAC RECOMB ADJUVANTED 50 MCG/0.5ML IM SUSR: 0.5000 mL | Freq: Once | INTRAMUSCULAR | 1 refills | Status: AC

## 2017-05-09 MED ORDER — AZITHROMYCIN 250 MG PO TABS: ORAL_TABLET | ORAL | 0 refills | Status: DC

## 2017-05-09 NOTE — Patient Instructions (Addendum)
Start IMODIUM every morning. Start daily allergy medication ([place]).  IF you received an x-ray today, you will receive an invoice from [facility]. Please contact [facility] at [phone] with questions or concerns regarding your invoice.   IF you received labwork today, you will receive an invoice from [facility]. Please contact [facility] at [phone] with questions or concerns regarding your invoice.   Our billing staff will not be able to assist you with questions regarding bills from these companies.  You will be contacted with the lab results as soon as they are available. The fastest way to get your results is to activate your My Chart account. Instructions are located on the last page of this paperwork. If you have not heard from [place] regarding the results in 2 weeks, please contact this office.      Preventive Care 40-64 Years, Female Preventive care refers to lifestyle choices and visits with your health care provider that can promote health and wellness. What does preventive care include?  A yearly physical exam. This is also called an annual well check.  Dental exams once or twice a year.  Routine eye exams. Ask your health care provider how often you should have your eyes checked.  Personal lifestyle choices, including: ? Daily care of your teeth and gums. ? Regular physical activity. ? Eating a healthy diet. ? Avoiding tobacco and drug use. ? Limiting alcohol use. ? Practicing safe sex. ? Taking low-dose aspirin daily starting at age 12. ? Taking vitamin and mineral supplements as recommended by your health care provider. What happens during an annual well check? The services and screenings done by your health care provider during your annual well check will depend on your age, overall health, lifestyle risk factors, and family history of disease. Counseling Your health care provider may ask you questions about  your:  Alcohol use.  Tobacco use.  Drug use.  Emotional well-being.  Home and relationship well-being.  Sexual activity.  Eating habits.  Work and work Statistician.  Method of birth control.  Menstrual cycle.  Pregnancy history.  Screening You may have the following tests or measurements:  Height, weight, and BMI.  Blood pressure.  Lipid and cholesterol levels. These may be checked every 5 years, or more frequently if you are over 45 years old.  Skin check.  Lung cancer screening. You may have this screening every year starting at age 74 if you have a 30-pack-year history of smoking and currently smoke or have quit within the past 15 years.  Fecal occult blood test (FOBT) of the stool. You may have this test every year starting at age 66.  Flexible sigmoidoscopy or colonoscopy. You may have a sigmoidoscopy every 5 years or a colonoscopy every 10 years starting at age 4.  Hepatitis C blood test.  Hepatitis B blood test.  Sexually transmitted disease (STD) testing.  Diabetes screening. This is done by checking your blood sugar (glucose) after you have not eaten for a while (fasting). You may have this done every 1-3 years.  Mammogram. This may be done every 1-2 years. Talk to your health care provider about when you should start having regular mammograms. This may depend on whether you have a family history of breast cancer.  BRCA-related cancer screening. This may be done if you have a family history of breast, ovarian, tubal, or peritoneal cancers.  Pelvic exam and Pap test. This may be done every 3 years starting at age 33.  Starting at age 40, this may be done every 5 years if you have a Pap test in combination with an HPV test.  Bone density scan. This is done to screen for osteoporosis. You may have this scan if you are at high risk for osteoporosis.  Discuss your test results, treatment options, and if necessary, the need for more tests with your health  care provider. Vaccines Your health care provider may recommend certain vaccines, such as:  Influenza vaccine. This is recommended every year.  Tetanus, diphtheria, and acellular pertussis (Tdap, Td) vaccine. You may need a Td booster every 10 years.  Varicella vaccine. You may need this if you have not been vaccinated.  Zoster vaccine. You may need this after age 91.  Measles, mumps, and rubella (MMR) vaccine. You may need at least one dose of MMR if you were born in 1957 or later. You may also need a second dose.  Pneumococcal 13-valent conjugate (PCV13) vaccine. You may need this if you have certain conditions and were not previously vaccinated.  Pneumococcal polysaccharide (PPSV23) vaccine. You may need one or two doses if you smoke cigarettes or if you have certain conditions.  Meningococcal vaccine. You may need this if you have certain conditions.  Hepatitis A vaccine. You may need this if you have certain conditions or if you travel or work in places where you may be exposed to hepatitis A.  Hepatitis B vaccine. You may need this if you have certain conditions or if you travel or work in places where you may be exposed to hepatitis B.  Haemophilus influenzae type b (Hib) vaccine. You may need this if you have certain conditions.  Talk to your health care provider about which screenings and vaccines you need and how often you need them. This information is not intended to replace advice given to you by your health care provider. Make sure you discuss any questions you have with your health care provider. Document Released: 09/10/2015 Document Revised: 05/03/2016 Document Reviewed: 06/15/2015 Elsevier Interactive Patient Education  2017 [employer].

## 2017-05-09 NOTE — Progress Notes (Signed)
Subjective:    Patient ID: Dorothy [name], female    DOB: [birth year], 61 y.o.   MRN: [id number]  05/09/2017  Annual Exam   HPI This 61 y.o. female presents for evaluation of Complete Physical Examination.  Last physical:  04-18-2016 Pap smear: hysterectomy;  Mammogram: 08-2016 Colonoscopy:  2011; overdue for repeat Bone density:  03-2015 [facility].  -2.7. Eye exam:  2018; L eye pain.  Pressure. Beavis. Dental exam:  Every six months.Fosamax.  IBS diarrhea: taking Cholestyramine not taking regularly because hard to swallow.  Recommended Imodium yet not taking.    BP Readings from Last 3 Encounters:  05/09/17 130/82  04/02/17 134/70  03/21/17 125/82   Wt Readings from Last 3 Encounters:  05/09/17 174 lb (78.9 kg)  04/02/17 177 lb (80.3 kg)  03/21/17 177 lb (80.3 kg)   Immunization History  Administered Date(s) Administered  . Hepatitis B 12/14/2011, 06/11/2012, 01/07/2013  . Influenza Split 06/11/2012  . Influenza,inj,Quad PF,6+ Mos 07/28/2013, 07/15/2014, 06/14/2015, 07/26/2016, 05/09/2017  . Pneumococcal Polysaccharide-23 06/14/2015  . Pneumococcal-Unspecified 08/29/2007  . Tdap 08/28/2008    Review of Systems  Constitutional: Negative for activity change, appetite change, chills, diaphoresis, fatigue, fever and unexpected weight change.  HENT: Negative for congestion, dental problem, drooling, ear discharge, ear pain, facial swelling, hearing loss, mouth sores, nosebleeds, postnasal drip, rhinorrhea, sinus pressure, sneezing, sore throat, tinnitus, trouble swallowing and voice change.   Eyes: Negative for photophobia, pain, discharge, redness, itching and visual disturbance.  Respiratory: Negative for apnea, cough, choking, chest tightness, shortness of breath, wheezing and stridor.   Cardiovascular: Negative for chest pain, palpitations and leg swelling.  Gastrointestinal: Positive for diarrhea. Negative for abdominal distention, abdominal pain, anal bleeding, blood in  stool, constipation, nausea, rectal pain and vomiting.  Endocrine: Negative for cold intolerance, heat intolerance, polydipsia, polyphagia and polyuria.  Genitourinary: Negative for decreased urine volume, difficulty urinating, dyspareunia, dysuria, enuresis, flank pain, frequency, genital sores, hematuria, menstrual problem, pelvic pain, urgency, vaginal bleeding, vaginal discharge and vaginal pain.       Nocturia x 1.  Stress incontinence with coughing or sneezing.   Musculoskeletal: Negative for arthralgias, back pain, gait problem, joint swelling, myalgias, neck pain and neck stiffness.  Skin: Negative for color change, pallor, rash and wound.  Allergic/Immunologic: Negative for environmental allergies, food allergies and immunocompromised state.  Neurological: Negative for dizziness, tremors, seizures, syncope, facial asymmetry, speech difficulty, weakness, light-headedness, numbness and headaches.  Hematological: Negative for adenopathy. Does not bruise/bleed easily.  Psychiatric/Behavioral: Negative for agitation, behavioral problems, confusion, decreased concentration, dysphoric mood, hallucinations, self-injury, sleep disturbance and suicidal ideas. The patient is not nervous/anxious and is not hyperactive.     Past Medical History:  Diagnosis Date  . Allergic rhinitis, cause unspecified   . Anxiety   . B12 deficiency   . Benign paroxysmal positional vertigo   . Choledocholithiasis   . Congestive heart failure ([place])   . Depression   . Diabetes mellitus   . Fatty liver   . Fatty liver 09/04/12  . Fibromyalgia   . Gastritis   . GERD (gastroesophageal reflux disease)   . Hematuria, unspecified   . Hiatal hernia   . Hx of adenomatous colonic polyps   . Hx of cardiovascular stress test    ETT-Myoview (12/15):  No ischemia.  No ECG changes.  EF 73%.  Low Risk.   [name] Hx of echocardiogram    Echo (12/15):  EF 60-65%, no RWMA, normal diast function, mild RVE  . Hyperlipidemia   .  Meniere's disease, unspecified   . Migraine   . Mononeuritis of unspecified site   . Osteoarthritis   . Osteoporosis, unspecified   . Otosclerosis, unspecified   . Peptic ulcer, unspecified site, unspecified as acute or chronic, without mention of hemorrhage, perforation, or obstruction   . Symptomatic menopausal or female climacteric states   . Unspecified disorder of kidney and ureter    Past Surgical History:  Procedure Laterality Date  . BILE DUCT EXPLORATION     gallstone removed  . BREAST ENHANCEMENT SURGERY Bilateral   . BUNIONECTOMY Left 12/2013  . CHOLECYSTECTOMY    . FOOT SURGERY     right  . 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 MOUTH SURGERY    . SHOULDER SURGERY     left  . TOTAL ABDOMINAL HYSTERECTOMY     pelvic pain/scar tissue; ovaries intact.  . TUBAL LIGATION     Allergies  Allergen Reactions  . Bactrim [Sulfamethoxazole-Trimethoprim] Other (See Comments)    blisters  . Keflex [Cephalexin] Hives  . Macrobid [Nitrofurantoin Macrocrystal] Other (See Comments)    blisters  . Nitrofurantoin Nausea And Vomiting and Other (See Comments)    Blisters   . Sulfa Drugs Cross Reactors Itching  . Adhesive [Tape] Other (See Comments)    Tears skins  . Codeine Itching  . Talwin [Pentazocine] Nausea And Vomiting and Rash    Muscle cramps and vision changes   Current Outpatient Prescriptions  Medication Sig Dispense Refill  . alendronate (FOSAMAX) 70 MG tablet TAKE 1 TABLET BY MOUTH ONCE WEEKLY BEFORE BREAKFAST, ON AN EMPTY STOMACH: REMAIN UPRIGHT FOR 30 MINUTES:TAKE WITH 8 OUNCES OF WATER 4 tablet 2  . aspirin 81 MG tablet Take 81 mg by mouth every evening.    . Calcium Carb-Cholecalciferol (CALCIUM 500 +D) 500-400 MG-UNIT TABS Take 1 tablet by mouth daily.     . Cholecalciferol  (VITAMIN D3) 2000 UNITS TABS Take 2,000 Units by mouth daily.     [name] DEXILANT 60 MG capsule TAKE ONE CAPSULE BY MOUTH DAILY 30 capsule 11  . fluticasone (FLONASE) 50 MCG/ACT nasal spray Place 2 sprays into both nostrils daily. 16 g 6  . gabapentin (NEURONTIN) 300 MG capsule TAKE ONE CAPSULE BY MOUTH THREE TIMES A DAY 90 capsule 2  . hydrochlorothiazide (HYDRODIURIL) 25 MG tablet TAKE ONE TABLET BY MOUTH DAILY 30 tablet 0  . JANUVIA 100 MG tablet TAKE ONE TABLET BY MOUTH DAILY 30 tablet 5  . losartan (COZAAR) 50 MG tablet TAKE ONE TABLET BY MOUTH DAILY 30 tablet 5  . metaxalone (SKELAXIN) 800 MG tablet Take 800 mg by mouth 3 (three) times daily.    . metFORMIN (GLUCOPHAGE) 500 MG tablet Take 1 tablet (500 mg total) by mouth 2 (two) times daily with a meal. 180 tablet 3  . omega-3 acid ethyl esters (LOVAZA) 1 g capsule Take 1 capsule (1 g total) by mouth 2 (two) times daily. 60 capsule 9  . pramipexole (MIRAPEX) 0.125 MG tablet Take 2 tablets (0.25 mg total) by mouth at bedtime. 180 tablet 3  . PREVALITE 4 g packet TAKE 1 PACKET (4G TOTAL) BY MOUTH TWO TIMES A DAY 60 packet 2  . simvastatin (ZOCOR) 40 MG tablet TAKE ONE TABLET BY MOUTH EVERY NIGHT AT BEDTIME 30 tablet 2  . venlafaxine XR (EFFEXOR-XR) 75 MG  24 hr capsule TAKE ONE CAPSULE BY MOUTH EVERY EVENING 30 capsule 0  . VOLTAREN 1 % GEL Apply 2 g topically 3 (three) times daily as needed (pain).     [name] azithromycin (ZITHROMAX) 250 MG tablet Two tablets daily x 1 day then one tablet daily x 4 days 6 tablet 0  . empagliflozin (JARDIANCE) 25 MG TABS tablet Take 25 mg by mouth daily. 90 tablet 3  . Potassium Chloride ER 20 MEQ TBCR TAKE TWO TABLETS BY MOUTH DAILY 60 tablet 0   No current facility-administered medications for this visit.    Social History   Social History  . Marital status: Married    Spouse name: Richardson Landry  . Number of children: 3  . Years of education: 10th grade   Occupational History  .  [employer]    Social History Main Topics  . Smoking status: Never Smoker  . Smokeless tobacco: Never Used  . Alcohol use 0.0 oz/week     Comment: occasional 1 x month  . Drug use: No  . Sexual activity: Yes    Birth control/ protection: Post-menopausal, Surgical   Other Topics Concern  . Not on file   Social History Narrative   Marital status:  Married x 22 years; happily married,no abuse. 2nd marriage.      Children: 3 sons (42, 39, 38)---2 sons with substance abuse; 3 grandchildren; 3 step grandchildren; 2 gg.      Lives: with husband, son/Brian.      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      Drugs:none      Exercise: no exercise in 2018      Seatbelt: 100%; no texting      Guns: none      Always uses seat belts. Smoke alarm and carbon monoxide detector in the home.No caffeine use. No unsecured guns in the home.    Family History  Problem Relation Age of Onset  . Prostate cancer Father   . Lung disease Father   . Diabetes Father   . Heart disease Father 33       CHF, AMI multiple/CABG  . COPD Father   . Heart attack Father   . Cancer Father        testicular cancer  . Diabetes Mother   . Hypertension Mother   . Hyperlipidemia Mother   . COPD Mother   . Cancer Mother        lung cancer  . Diabetes Brother   . Hypertension Brother   . Obesity Brother   . Hyperlipidemia Brother   . COPD Brother   . Stroke Maternal Grandmother   . Heart disease Maternal Grandfather   . COPD Paternal Grandmother   . Diabetes Paternal Grandmother   . COPD Paternal Grandfather   . Heart attack Paternal Grandfather   . Colon cancer Cousin        died age 38  . Breast cancer Maternal Aunt        Objective:    BP 130/82   Pulse 65   Temp 98 F (36.7 C) (Oral)   Resp 16   Ht 5' 4.96" (1.65 m)   Wt 174 lb (78.9 kg)   SpO2 97%   BMI 28.99 kg/m  Physical Exam  Constitutional: She is oriented to  person, place, and time. She appears well-developed  and well-nourished. No distress.  HENT:  Head: Normocephalic and atraumatic.  Right Ear: External ear normal.  Left Ear: External ear normal.  Nose: Nose normal.  Mouth/Throat: Oropharynx is clear and moist.  Eyes: Pupils are equal, round, and reactive to light. Conjunctivae and EOM are normal.  Neck: Normal range of motion and full passive range of motion without pain. Neck supple. No JVD present. Carotid bruit is not present. No thyromegaly present.  Cardiovascular: Normal rate, regular rhythm and normal heart sounds.  Exam reveals no gallop and no friction rub.   No murmur heard. Pulmonary/Chest: Effort normal and breath sounds normal. She has no wheezes. She has no rales. Right breast exhibits no inverted nipple, no mass, no nipple discharge, no skin change and no tenderness. Left breast exhibits no inverted nipple, no mass, no nipple discharge, no skin change and no tenderness. Breasts are symmetrical.  Abdominal: Soft. Bowel sounds are normal. She exhibits no distension and no mass. There is no tenderness. There is no rebound and no guarding.  Musculoskeletal:       Right shoulder: Normal.       Left shoulder: Normal.       Cervical back: Normal.  Lymphadenopathy:    She has no cervical adenopathy.  Neurological: She is alert and oriented to person, place, and time. She has normal reflexes. No cranial nerve deficit. She exhibits normal muscle tone. Coordination normal.  Skin: Skin is warm and dry. No rash noted. She is not diaphoretic. No erythema. No pallor.  Psychiatric: She has a normal mood and affect. Her behavior is normal. Judgment and thought content normal.  Nursing note and vitals reviewed.   No results found. Depression screen [facility] 2/9 05/09/2017 02/19/2017 01/31/2017 10/25/2016 07/26/2016  Decreased Interest 0 0 0 0 0  Down, Depressed, Hopeless 0 0 0 0 0  PHQ - 2 Score 0 0 0 0 0   Fall Risk  05/09/2017 02/19/2017 01/31/2017  10/25/2016 07/26/2016  Falls in the past year? Yes Yes No No No  Number falls in past yr: 1 1 - - -  Injury with Fall? No No - - -        Assessment & Plan:   1. Routine physical examination   2. Need for prophylactic vaccination and inoculation against influenza   3. Chronic diastolic heart failure ([place])   4. Irritable bowel syndrome with diarrhea   5. Type 2 diabetes mellitus with diabetic nephropathy, without long-term current use of insulin ([place])   6. Dyslipidemia   7. Seasonal allergic rhinitis due to pollen   8. Gastroesophageal reflux disease without esophagitis   9. Osteoporosis with current pathological fracture, unspecified osteoporosis type, sequela   10. Fibromyalgia   11. Need for shingles vaccine    -anticipatory guidance provided --- exercise, weight loss, safe driving practices, aspirin 81mg  daily. -obtain age appropriate screening labs and labs for chronic disease management. -recommend Imodium daily to bid for chronic diarrhea. -also recommend decreased Metformin to 500mg  bid due to diarrhea; add Jardiance. -new onset sinusitis; fill if no improvement in upcoming week.   Orders Placed This Encounter  Procedures  . Flu Vaccine QUAD 36+ mos IM  . CBC with Differential/Platelet  . Comprehensive metabolic panel    Order Specific Question:   Has the patient fasted?    Answer:   Yes  . Hemoglobin A1c  . Lipid panel    Order Specific Question:   Has the patient fasted?    Answer:  Yes  . Microalbumin, urine  . TSH  . POCT urinalysis dipstick   Meds ordered this encounter  Medications  . Zoster Vac Recomb Adjuvanted [facility]) injection    Sig: Inject 0.5 mLs into the muscle once.    Dispense:  0.5 mL    Refill:  1  . azithromycin (ZITHROMAX) 250 MG tablet    Sig: Two tablets daily x 1 day then one tablet daily x 4 days    Dispense:  6 tablet    Refill:  0  . empagliflozin (JARDIANCE) 25 MG TABS tablet    Sig: Take 25 mg by mouth daily.    Dispense:   90 tablet    Refill:  3  . metFORMIN (GLUCOPHAGE) 500 MG tablet    Sig: Take 1 tablet (500 mg total) by mouth 2 (two) times daily with a meal.    Dispense:  180 tablet    Refill:  3    Return in about 3 months (around 08/08/2017) for recheck diabetes, high cholesterol, anxiety.   Bryker Fletchall Elayne Guerin, M.D. Primary Care at [facility] [address] [place], [place]  [address] [phone] phone [phone] fax

## 2017-05-09 NOTE — Progress Notes (Signed)
   Subjective:    Patient ID: Dorothy [name], female    DOB: [birth year], 61 y.o.   MRN: [id number]  HPI    Review of Systems  HENT: Positive for ear pain, sinus pressure, sneezing and sore throat.   Eyes: Positive for pain.  Respiratory: Positive for cough.   Allergic/Immunologic: Positive for environmental allergies.  Neurological: Positive for dizziness and headaches.       Objective:   Physical Exam        Assessment & Plan:

## 2017-05-10 LAB — COMPREHENSIVE METABOLIC PANEL
A/G RATIO: 2.7 — AB (ref 1.2–2.2)
ALBUMIN: 4.8 g/dL (ref 3.6–4.8)
ALT: 52 IU/L — AB (ref 0–32)
AST: 42 IU/L — ABNORMAL HIGH (ref 0–40)
Alkaline Phosphatase: 73 IU/L (ref 39–117)
BILIRUBIN TOTAL: 0.4 mg/dL (ref 0.0–1.2)
BUN/Creatinine Ratio: 19 (ref 12–28)
BUN: 17 mg/dL (ref 8–27)
CO2: 20 mmol/L (ref 20–29)
Calcium: 9.6 mg/dL (ref 8.7–10.3)
Chloride: 100 mmol/L (ref 96–106)
Creatinine, Ser: 0.89 mg/dL (ref 0.57–1.00)
GFR calc non Af Amer: 71 mL/min/{1.73_m2} (ref >59)
GFR, EST AFRICAN AMERICAN: 81 mL/min/{1.73_m2} (ref >59)
Globulin, Total: 1.8 g/dL (ref 1.5–4.5)
Glucose: 200 mg/dL — ABNORMAL HIGH (ref 65–99)
Potassium: 3.8 mmol/L (ref 3.5–5.2)
Sodium: 142 mmol/L (ref 134–144)
TOTAL PROTEIN: 6.6 g/dL (ref 6.0–8.5)

## 2017-05-10 LAB — CBC WITH DIFFERENTIAL/PLATELET
BASOS: 0 %
Basophils Absolute: 0 10*3/uL (ref 0.0–0.2)
EOS (ABSOLUTE): 0 10*3/uL (ref 0.0–0.4)
Eos: 1 %
Hematocrit: 42.2 % (ref 34.0–46.6)
Hemoglobin: 13.5 g/dL (ref 11.1–15.9)
IMMATURE GRANS (ABS): 0 10*3/uL (ref 0.0–0.1)
Immature Granulocytes: 0 %
LYMPHS: 33 %
Lymphocytes Absolute: 1.5 10*3/uL (ref 0.7–3.1)
MCH: 31.4 pg (ref 26.6–33.0)
MCHC: 32 g/dL (ref 31.5–35.7)
MCV: 98 fL — AB (ref 79–97)
Monocytes Absolute: 0.4 10*3/uL (ref 0.1–0.9)
Monocytes: 8 %
NEUTROS ABS: 2.6 10*3/uL (ref 1.4–7.0)
Neutrophils: 58 %
PLATELETS: 216 10*3/uL (ref 150–379)
RBC: 4.3 x10E6/uL (ref 3.77–5.28)
RDW: 13.7 % (ref 12.3–15.4)
WBC: 4.5 10*3/uL (ref 3.4–10.8)

## 2017-05-10 LAB — MICROALBUMIN, URINE: Microalbumin, Urine: 184.3 ug/mL

## 2017-05-10 LAB — HEMOGLOBIN A1C
Est. average glucose Bld gHb Est-mCnc: 177 mg/dL
Hgb A1c MFr Bld: 7.8 % — ABNORMAL HIGH (ref 4.8–5.6)

## 2017-05-10 LAB — LIPID PANEL
CHOL/HDL RATIO: 3 ratio (ref 0.0–4.4)
CHOLESTEROL TOTAL: 184 mg/dL (ref 100–199)
HDL: 61 mg/dL (ref >39)
LDL CALC: 76 mg/dL (ref 0–99)
TRIGLYCERIDES: 235 mg/dL — AB (ref 0–149)
VLDL CHOLESTEROL CAL: 47 mg/dL — AB (ref 5–40)

## 2017-05-10 LAB — TSH: TSH: 5.13 u[IU]/mL — ABNORMAL HIGH (ref 0.450–4.500)

## 2017-05-11 DIAGNOSIS — J301 Allergic rhinitis due to pollen: Secondary | ICD-10-CM | POA: Insufficient documentation

## 2017-05-14 ENCOUNTER — Encounter: Payer: Self-pay | Admitting: Gastroenterology

## 2017-05-15 ENCOUNTER — Telehealth: Payer: Self-pay

## 2017-05-15 NOTE — Telephone Encounter (Signed)
Dr. Tamala Julian, Received a message on Dorothy [name] stating it is a non-formulary medication on her plan.  Formulary are [place].   Would you like to change medication to one of these? Please advise. Thank you, Kamarie Veno

## 2017-05-16 MED ORDER — DAPAGLIFLOZIN PROPANEDIOL 10 MG PO TABS: 10.0000 mg | ORAL_TABLET | Freq: Every day | ORAL | 3 refills | Status: DC

## 2017-05-16 NOTE — Telephone Encounter (Signed)
I sent in [place] instead of [place] as insurance denied [name] and prefers [place] for diabetes.  Please advise patient.

## 2017-05-17 NOTE — Telephone Encounter (Signed)
Left detailed message for patient stating Dr. Thompson Caul message. I left her my direct line incase she has any questions.

## 2017-05-23 ENCOUNTER — Other Ambulatory Visit: Payer: Self-pay | Admitting: Rheumatology

## 2017-05-23 NOTE — Telephone Encounter (Signed)
Last Visit: 03/21/17 Next Visit: 09/05/16  Okay to refill per Dr. Estanislado Pandy

## 2017-05-28 ENCOUNTER — Other Ambulatory Visit: Payer: Self-pay | Admitting: Family Medicine

## 2017-06-05 ENCOUNTER — Other Ambulatory Visit: Payer: Self-pay | Admitting: Family Medicine

## 2017-06-08 NOTE — Telephone Encounter (Signed)
SS refill req Effexor XR To Dr. Tamala Julian

## 2017-06-22 ENCOUNTER — Other Ambulatory Visit: Payer: Self-pay | Admitting: Cardiovascular Disease

## 2017-06-25 ENCOUNTER — Other Ambulatory Visit: Payer: Self-pay | Admitting: Family Medicine

## 2017-06-27 ENCOUNTER — Ambulatory Visit (AMBULATORY_SURGERY_CENTER): Payer: Self-pay | Admitting: *Deleted

## 2017-06-27 VITALS — Ht 65.0 in | Wt 173.0 lb

## 2017-06-27 DIAGNOSIS — R197 Diarrhea, unspecified: Secondary | ICD-10-CM

## 2017-06-27 MED ORDER — NA SULFATE-K SULFATE-MG SULF 17.5-3.13-1.6 GM/177ML PO SOLN: 1.0000 | Freq: Once | ORAL | 0 refills | Status: AC

## 2017-06-27 NOTE — Progress Notes (Signed)
No egg or soy allergy known to patient  No issues with past sedation with any surgeries  or procedures, no intubation problems  No diet pills per patient No home 02 use per patient  No blood thinners per patient  Pt denies issues with constipation  No A fib or A flutter  EMMI video sent to pt's e mail  

## 2017-07-04 ENCOUNTER — Encounter: Payer: Self-pay | Admitting: Gastroenterology

## 2017-07-11 ENCOUNTER — Encounter: Payer: Self-pay | Admitting: Gastroenterology

## 2017-07-11 ENCOUNTER — Ambulatory Visit (AMBULATORY_SURGERY_CENTER): Payer: 59 | Admitting: Gastroenterology

## 2017-07-11 VITALS — BP 97/50 | HR 68 | Temp 96.3°F | Resp 15 | Ht 65.0 in | Wt 173.0 lb

## 2017-07-11 DIAGNOSIS — R197 Diarrhea, unspecified: Secondary | ICD-10-CM

## 2017-07-11 DIAGNOSIS — D123 Benign neoplasm of transverse colon: Secondary | ICD-10-CM

## 2017-07-11 DIAGNOSIS — D120 Benign neoplasm of cecum: Secondary | ICD-10-CM

## 2017-07-11 MED ORDER — SODIUM CHLORIDE 0.9 % IV SOLN: 500.0000 mL | INTRAVENOUS | Status: DC

## 2017-07-11 MED ORDER — COLESTIPOL HCL 1 G PO TABS: 1.0000 g | ORAL_TABLET | Freq: Two times a day (BID) | ORAL | 3 refills | Status: DC

## 2017-07-11 NOTE — Progress Notes (Signed)
Pt's states no medical or surgical changes since previsit or office visit. 

## 2017-07-11 NOTE — Op Note (Signed)
[place] Patient Name: Dorothy [name] Procedure Date: 07/11/2017 10:38 AM MRN: [id number] Endoscopist: Mauri Pole , MD Age: 61 Referring MD:  Date of Birth: 1956-06-14 Gender: Female Account #: [id number] Procedure:                Colonoscopy Indications:              Clinically significant diarrhea of unexplained                            origin Medicines:                Monitored Anesthesia Care Procedure:                Pre-Anesthesia Assessment:                           - Prior to the procedure, a History and Physical                            was performed, and patient medications and                            allergies were reviewed. The patient's tolerance of                            previous anesthesia was also reviewed. The risks                            and benefits of the procedure and the sedation                            options and risks were discussed with the patient.                            All questions were answered, and informed consent                            was obtained. Prior Anticoagulants: The patient has                            taken no previous anticoagulant or antiplatelet                            agents. ASA Grade Assessment: II - A patient with                            mild systemic disease. After reviewing the risks                            and benefits, the patient was deemed in                            satisfactory condition to undergo the procedure.  After obtaining informed consent, the colonoscope                            was passed under direct vision. Throughout the                            procedure, the patient's blood pressure, pulse, and                            oxygen saturations were monitored continuously. The                            Model PCF-H190DL [id number]) scope was introduced                            through the anus and advanced to the the cecum,                             identified by appendiceal orifice and ileocecal                            valve. The colonoscopy was performed without                            difficulty. The patient tolerated the procedure                            well. The quality of the bowel preparation was                            good. The ileocecal valve, appendiceal orifice, and                            rectum were photographed. Scope In: 10:44:12 AM Scope Out: 11:00:09 AM Scope Withdrawal Time: 0 hours 12 minutes 49 seconds  Total Procedure Duration: 0 hours 15 minutes 57 seconds  Findings:                 The perianal and digital rectal examinations were                            normal.                           Two sessile polyps were found in the transverse                            colon and cecum. The polyps were 2 to 3 mm in size.                            These polyps were removed with a cold biopsy                            forceps. Resection and retrieval were complete.  A 5 mm polyp was found in the transverse colon. The                            polyp was sessile. The polyp was removed with a                            cold snare. Resection and retrieval were complete.                           Normal mucosa was found in the entire colon.                            Biopsies for histology were taken with a cold                            forceps from the right colon and left colon for                            evaluation of microscopic colitis.                           Non-bleeding internal hemorrhoids were found during                            retroflexion. The hemorrhoids were medium-sized.                            Estimated blood loss was minimal. Complications:            No immediate complications. Estimated Blood Loss:     Estimated blood loss was minimal. Impression:               - Two 2 to 3 mm polyps in the transverse colon and                             in the cecum, removed with a cold biopsy forceps.                            Resected and retrieved.                           - One 5 mm polyp in the transverse colon, removed                            with a cold snare. Resected and retrieved.                           - Normal mucosa in the entire examined colon.                            Biopsied.                           - Non-bleeding internal hemorrhoids. Recommendation:           -  Patient has a contact number available for                            emergencies. The signs and symptoms of potential                            delayed complications were discussed with the                            patient. Return to normal activities tomorrow.                            Written discharge instructions were provided to the                            patient.                           - Resume previous diet.                           - Continue present medications.                           - Await pathology results.                           - Repeat colonoscopy in 5-10 years for surveillance                            based on pathology results.                           - Use Colestid 1 gm tablet PO BID. Mauri Pole, MD 07/11/2017 11:09:09 AM This report has been signed electronically.

## 2017-07-11 NOTE — Patient Instructions (Signed)
YOU HAD AN ENDOSCOPIC PROCEDURE TODAY AT [facility]:   Refer to the procedure report that was given to you for any specific questions about what was found during the examination.  If the procedure report does not answer your questions, please call your gastroenterologist to clarify.  If you requested that your care partner not be given the details of your procedure findings, then the procedure report has been included in a sealed envelope for you to review at your convenience later.  YOU SHOULD EXPECT: Some feelings of bloating in the abdomen. Passage of more gas than usual.  Walking can help get rid of the air that was put into your GI tract during the procedure and reduce the bloating. If you had a lower endoscopy (such as a colonoscopy or flexible sigmoidoscopy) you may notice spotting of blood in your stool or on the toilet paper. If you underwent a bowel prep for your procedure, you may not have a normal bowel movement for a few days.  Please Note:  You might notice some irritation and congestion in your nose or some drainage.  This is from the oxygen used during your procedure.  There is no need for concern and it should clear up in a day or so.  SYMPTOMS TO REPORT IMMEDIATELY:   Following lower endoscopy (colonoscopy or flexible sigmoidoscopy):  Excessive amounts of blood in the stool  Significant tenderness or worsening of abdominal pains  Swelling of the abdomen that is new, acute  Fever of 100F or higher  For urgent or emergent issues, a gastroenterologist can be reached at any hour by calling [phone].  DIET:  We do recommend a small meal at first, but then you may proceed to your regular diet.  Drink plenty of fluids but you should avoid alcoholic beverages for 24 hours.  ACTIVITY:  You should plan to take it easy for the rest of today and you should NOT DRIVE or use heavy machinery until tomorrow (because of the sedation medicines used during the test).     FOLLOW UP: Our staff will call the number listed on your records the next business day following your procedure to check on you and address any questions or concerns that you may have regarding the information given to you following your procedure. If we do not reach you, we will leave a message.  However, if you are feeling well and you are not experiencing any problems, there is no need to return our call.  We will assume that you have returned to your regular daily activities without incident.  If any biopsies were taken you will be contacted by phone or by letter within the next 1-3 weeks.  Please call us at [phone] if you have not heard about the biopsies in 3 weeks.   SIGNATURES/CONFIDENTIALITY: You and/or your care partner have signed paperwork which will be entered into your electronic medical record.  These signatures attest to the fact that that the information above on your After Visit Summary has been reviewed and is understood.  Full responsibility of the confidentiality of this discharge information lies with you and/or your care-partner.  Await pathology- next colonoscopy 5 or 10 years pending pathology  Please read over handouts about polyps, hemorrhoids  Continue your normal medications, take Colestid 1 gram tablet twice daily

## 2017-07-11 NOTE — Progress Notes (Signed)
Called to room to assist during endoscopic procedure.  Patient ID and intended procedure confirmed with present staff. Received instructions for my participation in the procedure from the performing physician.  

## 2017-07-11 NOTE — Progress Notes (Signed)
To PACU, VSS. Report to [email or web address] 

## 2017-07-12 ENCOUNTER — Telehealth: Payer: Self-pay

## 2017-07-12 NOTE — Telephone Encounter (Signed)
  Follow up Call-  Call back number 07/11/2017  Post procedure Call Back phone  # [phone]  Permission to leave phone message Yes  Some recent data might be hidden     Patient questions:  Do you have a fever, pain , or abdominal swelling? No. Pain Score  0 *  Have you tolerated food without any problems? Yes.    Have you been able to return to your normal activities? Yes.    Do you have any questions about your discharge instructions: Diet   No. Medications  No. Follow up visit  No.  Do you have questions or concerns about your Care? No.  Actions: * If pain score is 4 or above: No action needed, pain <4.

## 2017-07-18 ENCOUNTER — Ambulatory Visit (HOSPITAL_COMMUNITY)
Admission: EM | Admit: 2017-07-18 | Discharge: 2017-07-18 | Disposition: A | Discharge status: Home / Self Care | Payer: 59 | Attending: Family Medicine | Admitting: Family Medicine

## 2017-07-18 ENCOUNTER — Ambulatory Visit (INDEPENDENT_AMBULATORY_CARE_PROVIDER_SITE_OTHER): Payer: 59

## 2017-07-18 ENCOUNTER — Encounter (HOSPITAL_COMMUNITY): Payer: Self-pay | Admitting: Emergency Medicine

## 2017-07-18 DIAGNOSIS — J22 Unspecified acute lower respiratory infection: Secondary | ICD-10-CM | POA: Diagnosis not present

## 2017-07-18 DIAGNOSIS — R0602 Shortness of breath: Secondary | ICD-10-CM

## 2017-07-18 DIAGNOSIS — R062 Wheezing: Secondary | ICD-10-CM | POA: Diagnosis not present

## 2017-07-18 DIAGNOSIS — R05 Cough: Secondary | ICD-10-CM | POA: Diagnosis not present

## 2017-07-18 MED ORDER — IPRATROPIUM-ALBUTEROL 0.5-2.5 (3) MG/3ML IN SOLN
3.0000 mL | Freq: Once | RESPIRATORY_TRACT | Status: AC
  Administered 2017-07-18: 3 mL via RESPIRATORY_TRACT

## 2017-07-18 MED ORDER — ALBUTEROL SULFATE HFA 108 (90 BASE) MCG/ACT IN AERS: 1.0000 | INHALATION_SPRAY | Freq: Four times a day (QID) | RESPIRATORY_TRACT | 0 refills | Status: DC | PRN

## 2017-07-18 MED ORDER — AZITHROMYCIN 250 MG PO TABS: ORAL_TABLET | ORAL | 0 refills | Status: DC

## 2017-07-18 MED ORDER — IPRATROPIUM-ALBUTEROL 0.5-2.5 (3) MG/3ML IN SOLN
RESPIRATORY_TRACT | Status: AC
  Filled 2017-07-18: qty 3

## 2017-07-18 MED ORDER — PREDNISONE 10 MG PO TABS: ORAL_TABLET | ORAL | 0 refills | Status: AC

## 2017-07-18 NOTE — Discharge Instructions (Signed)
Push fluids to ensure adequate hydration and keep secretions thin. Use of inhaler as needed for wheezing. Prednisone as directed, please monitor blood sugars. Complete course of antibiotics. If symptoms worsen or do not improve in the next week to return to be seen or to follow up with PCP.

## 2017-07-18 NOTE — ED Triage Notes (Signed)
PT C/O: cold sx... Hx of CHF  ONSET: 6 days  SX INCLUDE: dry cough, chest congestion, SOB, wheezing  DENIES: fevers, chills  TAKING MEDS: OTC Mucinex   A&O x4... NAD... Ambulatory

## 2017-07-18 NOTE — ED Provider Notes (Signed)
Dorothy [name]    CSN: [id number] Arrival date & time: 07/18/17  1304     History   Chief Complaint Chief Complaint  Patient presents with  . URI    HPI Dorothy [name] is a 61 y.o. female.   Shawneequa presents with complaints of cough and shortness of breath. Started out as sinus congestions approximately 6 days ago and has progressed to dry cough and wheezing. No preivous history of wheezing. No know fevers. History of CHF, denies increased extremity edema. Scratchy throat, denies ear pain. Chest feels tight with wheezing but otherwise denies chest pain. Without history of asthma or COPD. Does not smoke. Has been taking mucinex which has not helped. 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 She states she did have a colonoscopy last week, per notes 11/14.    ROS per HPI.       Past Medical History:  Diagnosis Date  . Allergic rhinitis, cause unspecified   . Allergy   . Anxiety   . B12 deficiency   . Benign paroxysmal positional vertigo   . Choledocholithiasis   . Congestive heart failure ([place])   . Depression   . Diabetes mellitus   . Fatty liver   . Fatty liver 09/04/12  . Fibromyalgia   . Gastritis   . GERD (gastroesophageal reflux disease)   . Hematuria, unspecified   . Hiatal hernia   . Hx of adenomatous colonic polyps   . Hx of cardiovascular stress test    ETT-Myoview (12/15):  No ischemia.  No ECG changes.  EF 73%.  Low Risk.   [name] Hx of echocardiogram    Echo (12/15):  EF 60-65%, no RWMA, normal diast function, mild RVE  . Hyperlipidemia   . Meniere's disease, unspecified   . Migraine   . Mononeuritis of unspecified site   . Osteoarthritis   . Osteoporosis, unspecified   . Otosclerosis, unspecified   . Peptic ulcer, unspecified site, unspecified as acute or chronic, without mention of hemorrhage, perforation, or obstruction   . Symptomatic menopausal or female climacteric states   .  Unspecified disorder of kidney and ureter     Patient Active Problem List   Diagnosis Date Noted  . Seasonal allergic rhinitis due to pollen 05/11/2017  . Degenerative disc disease, cervical 09/20/2016  . Osteoporosis with current pathological fracture 06/19/2016  . Other secondary osteoarthritis of both hands 04/15/2016  . Osteoarthritis of pelvis 04/15/2016  . DDD (degenerative disc disease), lumbar 04/15/2016  . Neuropathy 04/15/2016  . IBS (irritable bowel syndrome) 04/15/2016  . Chronic diastolic heart failure ([place]) 06/14/2015  . Premature ventricular contractions 12/10/2013  . Fibromyalgia 10/03/2012  . Abdominal pain, epigastric 10/01/2012  . COLONIC POLYPS, ADENOMATOUS 09/21/2007  . DM2 (diabetes mellitus, type 2) ([place]) 09/21/2007  . HLD (hyperlipidemia) 09/21/2007  . Anxiety state 09/21/2007  . GERD 09/21/2007  . HIATAL HERNIA 09/21/2007  . CHOLEDOCHOLITHIASIS, HX OF 09/21/2007  . Acute gastritis 07/30/2007    Past Surgical History:  Procedure Laterality Date  . BILE DUCT EXPLORATION     gallstone removed  . BREAST ENHANCEMENT SURGERY Bilateral   . BUNIONECTOMY Left 12/2013  . CHOLECYSTECTOMY    . COLONOSCOPY    . FOOT SURGERY     right  . 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 MOUTH SURGERY    . SHOULDER SURGERY     left  . TOTAL ABDOMINAL HYSTERECTOMY     pelvic pain/scar tissue; ovaries intact.  . TUBAL LIGATION      OB History    No data available       Home Medications    Prior to Admission medications   Medication Sig Start Date End Date Taking? Authorizing Provider  aspirin 81 MG tablet Take 81 mg by mouth every evening.   Yes [provider]  Calcium Carb-Cholecalciferol (CALCIUM 500 +D) 500-400 MG-UNIT TABS Take 1 tablet by mouth daily.    Yes [provider]  colestipol (COLESTID) 1 g tablet Take 1 tablet (1 g total) 2 (two) times daily by mouth. 07/11/17  Yes Nandigam, Venia Minks, MD  dapagliflozin propanediol (FARXIGA) 10 MG TABS tablet Take 10 mg by mouth daily. 05/16/17  Yes [name], MD  DEXILANT 60 MG capsule TAKE ONE CAPSULE BY MOUTH DAILY 07/03/17  Yes [name], MD  fluticasone [facility]) 50 MCG/ACT nasal spray Place 2 sprays into both nostrils daily. 02/19/17  Yes Sagardia, Ines Bloomer, MD  gabapentin (NEURONTIN) 300 MG capsule TAKE ONE CAPSULE BY MOUTH THREE TIMES A DAY Patient taking differently: TAKE ONE CAPSULE BY MOUTH Twice  TIMES A DAY 05/23/17  Yes Deveshwar, Abel Presto, MD  hydrochlorothiazide (HYDRODIURIL) 25 MG tablet TAKE ONE TABLET BY MOUTH DAILY 05/28/17  Yes [name], MD  JANUVIA 100 MG tablet TAKE ONE TABLET BY MOUTH DAILY 02/22/17  Yes [name], MD  losartan (COZAAR) 50 MG tablet TAKE ONE TABLET BY MOUTH DAILY 05/08/17  Yes [name], MD  metaxalone (SKELAXIN) 800 MG tablet Take 800 mg by mouth 3 (three) times daily.   Yes [provider]  metFORMIN (GLUCOPHAGE) 500 MG tablet Take 1 tablet (500 mg total) by mouth 2 (two) times daily with a meal. 05/09/17  Yes [name], MD  Misc Natural Products (DANDELION ROOT) 520 MG CAPS Take by mouth 2 (two) times daily after a meal.   Yes [provider]  omega-3 acid ethyl esters (LOVAZA) 1 g capsule Take 1 capsule (1 g total) by mouth 2 (two) times daily. 09/20/16  Yes Bo Merino, MD  Potassium Chloride ER 20 MEQ TBCR TAKE TWO TABLETS BY MOUTH DAILY 06/22/17  Yes Sherren Mocha, MD  pramipexole (MIRAPEX) 0.125 MG tablet Take 2 tablets (0.25 mg total) by mouth at bedtime. 08/23/16  Yes Star Age, MD  PREVALITE 4 g packet TAKE 1 PACKET (4G TOTAL) BY MOUTH TWO TIMES A DAY 10/02/16  Yes Nandigam, Kavitha V, MD  simvastatin (ZOCOR) 40 MG tablet TAKE ONE TABLET BY MOUTH EVERY NIGHT AT BEDTIME 05/08/17  Yes [name], MD    venlafaxine XR (EFFEXOR-XR) 75 MG 24 hr capsule TAKE ONE CAPSULE BY MOUTH EVERY EVENING 06/08/17  Yes [name], MD  VOLTAREN 1 % GEL Apply 2 g topically 3 (three) times daily as needed (pain).  04/27/14  Yes [provider]  albuterol (PROVENTIL HFA;VENTOLIN HFA) 108 (90 Base) MCG/ACT inhaler Inhale 1-2 puffs into the lungs every 6 (six) hours as needed for wheezing or shortness of breath. 07/18/17   Zigmund Gottron, NP  azithromycin (ZITHROMAX) 250 MG tablet Take first 2 tablets together, then 1 every day until finished. 07/18/17   Zigmund Gottron, NP  empagliflozin (JARDIANCE) 25 MG TABS tablet Take 25 mg by mouth daily. 05/09/17  [name], MD  predniSONE (DELTASONE) 10 MG tablet Take 4 tablets (40 mg total) by mouth daily for 2 days, THEN 2 tablets (20 mg total) daily for 2 days, THEN 1 tablet (10 mg total) daily for 2 days. 07/18/17 07/24/17  Zigmund Gottron, NP    Family History Family History  Problem Relation Age of Onset  . Prostate cancer Father   . Lung disease Father   . Diabetes Father   . Heart disease Father 56       CHF, AMI multiple/CABG  . COPD Father   . Heart attack Father   . Cancer Father        testicular cancer  . Diabetes Mother   . Hypertension Mother   . Hyperlipidemia Mother   . COPD Mother   . Cancer Mother        lung cancer  . Diabetes Brother   . Hypertension Brother   . Obesity Brother   . Hyperlipidemia Brother   . COPD Brother   . Stroke Maternal Grandmother   . Heart disease Maternal Grandfather   . COPD Paternal Grandmother   . Diabetes Paternal Grandmother   . COPD Paternal Grandfather   . Heart attack Paternal Grandfather   . Colon cancer Cousin        died age 47  . Breast cancer Maternal Aunt   . Colon cancer Cousin   . Colon polyps Neg Hx   . Esophageal cancer Neg Hx   . Rectal cancer Neg Hx   . Stomach cancer Neg Hx     Social History Social History   Tobacco Use  . Smoking status: Never Smoker  .  Smokeless tobacco: Never Used  Substance Use Topics  . Alcohol use: Yes    Alcohol/week: 0.0 oz    Comment: occasional 1 x month  . Drug use: No     Allergies   Bactrim [sulfamethoxazole-trimethoprim]; Keflex [cephalexin]; Macrobid [nitrofurantoin macrocrystal]; Nitrofurantoin; Sulfa drugs cross reactors; Adhesive [tape]; Codeine; and Talwin [pentazocine]   Review of Systems Review of Systems   Physical Exam Triage Vital Signs ED Triage Vitals [07/18/17 1327]  Enc Vitals Group     BP 127/74     Pulse Rate 72     Resp 20     Temp 98.1 F (36.7 C)     Temp Source Oral     SpO2 99 %     Weight      Height      Head Circumference      Peak Flow      Pain Score      Pain Loc      Pain Edu?      Excl. in [place]?    No data found.  Updated Vital Signs BP 127/74 (BP Location: Left Arm)   Pulse 72   Temp 98.1 F (36.7 C) (Oral)   Resp 20   SpO2 99%   Visual Acuity Right Eye Distance:   Left Eye Distance:   Bilateral Distance:    Right Eye Near:   Left Eye Near:    Bilateral Near:     Physical Exam  Constitutional: She is oriented to person, place, and time. She appears well-developed and well-nourished. No distress.  HENT:  Head: Normocephalic and atraumatic.  Right Ear: Tympanic membrane, external ear and ear canal normal.  Left Ear: Tympanic membrane, external ear and ear canal normal.  Nose: Nose normal.  Mouth/Throat: Uvula is midline, oropharynx is clear and  moist and mucous membranes are normal. No tonsillar exudate.  Eyes: Conjunctivae and EOM are normal. Pupils are equal, round, and reactive to light.  Cardiovascular: Normal rate, regular rhythm and normal heart sounds.  Pulmonary/Chest: Effort normal. Tachypnea noted. She has wheezes. She has rhonchi in the right lower field and the left upper field.  Scattered wheezes throughout; strong congested cough noted; without edema present to lower or upper extremities; lungs significantly more clear s/p  nebulizer with improved RR and patient subjectively states feels improved; slight crackles still present  Lymphadenopathy:    She has no cervical adenopathy.  Neurological: She is alert and oriented to person, place, and time.  Skin: Skin is warm and dry.     UC Treatments / Results  Labs (all labs ordered are listed, but only abnormal results are displayed) Labs Reviewed - No data to display  EKG  EKG Interpretation None       Radiology Dg Chest 2 View  Result Date: 07/18/2017 CLINICAL DATA:  Per pt: sick for six days, hard dry cough, no fever, chest and head congestion. Non-smoker. No history respiratory disease. CHF 06/2014. No HBP. Patient is a diabetic. EXAM: CHEST  2 VIEW COMPARISON:  07/27/2014 FINDINGS: Midline trachea. Normal heart size. Atherosclerosis in the transverse aorta. No pleural effusion or pneumothorax. Biapical pleural thickening. Clear lungs. IMPRESSION: No acute cardiopulmonary disease. Aortic Atherosclerosis (ICD10-I70.0). Electronically Signed   By: Abigail Miyamoto M.D.   On: 07/18/2017 14:32    Procedures Procedures (including critical care time)  Medications Ordered in UC Medications  ipratropium-albuterol (DUONEB) 0.5-2.5 (3) MG/3ML nebulizer solution 3 mL (3 mLs Nebulization Given 07/18/17 1443)     Initial Impression / Assessment and Plan / UC Course  I have reviewed the triage vital signs and the nursing notes.  Pertinent labs & imaging results that were available during my care of the patient were reviewed by me and considered in my medical decision making (see chart for details).     Lung sounds with significant improvement s/p nebulizer. Normal chest xray today. Still with some crackles, antibiotics initiated at this time, prednisone and inhaler for prn use. Lower dose steroids as with diabetes, monitor blood sugars. Continue to monitor weight edema and breathing pattern, return if worsening of symptoms, development of fevers or chest pain.  Patient agreeable to plan at this time. If symptoms worsen or do not improve in the next week to return to be seen or to follow up with PCP. Patient verbalized understanding.   Final Clinical Impressions(s) / UC Diagnoses   Final diagnoses:  Lower respiratory infection  Wheezing    ED Discharge Orders        Ordered    predniSONE (DELTASONE) 10 MG tablet     07/18/17 1504    albuterol (PROVENTIL HFA;VENTOLIN HFA) 108 (90 Base) MCG/ACT inhaler  Every 6 hours PRN     07/18/17 1504    azithromycin (ZITHROMAX) 250 MG tablet     07/18/17 1504       Controlled Substance Prescriptions [place] Controlled Substance Registry consulted? Not Applicable   Zigmund Gottron, NP 07/18/17 [phone]

## 2017-07-22 ENCOUNTER — Other Ambulatory Visit: Payer: Self-pay | Admitting: Rheumatology

## 2017-07-23 NOTE — Telephone Encounter (Signed)
Last Visit: 03/21/17 Next Visit: 09/05/16  Okay to refill per Dr. Estanislado Pandy

## 2017-08-08 ENCOUNTER — Other Ambulatory Visit: Payer: Self-pay | Admitting: Rheumatology

## 2017-08-08 ENCOUNTER — Other Ambulatory Visit: Payer: Self-pay | Admitting: Family Medicine

## 2017-08-08 NOTE — Telephone Encounter (Signed)
Last Visit: 03/21/17 Next Visit: 09/05/16  Okay to refill per Dr. Estanislado Pandy

## 2017-08-22 ENCOUNTER — Other Ambulatory Visit: Payer: Self-pay | Admitting: Rheumatology

## 2017-08-22 NOTE — Telephone Encounter (Signed)
Last Visit: 03/21/17 Next Visit: 09/05/16  Okay to refill per Dr. Estanislado Pandy

## 2017-08-24 ENCOUNTER — Other Ambulatory Visit: Payer: Self-pay | Admitting: Family Medicine

## 2017-08-24 NOTE — Progress Notes (Signed)
Office Visit Note  Patient: Dorothy [name]             Date of Birth: Feb 15, 1956           MRN: [id number]             PCP: [name], MD Referring: [name], MD Visit Date: 09/05/2017 Occupation: @GUAROCC @    Subjective:  Pain in bilateral hands   History of Present Illness: Dorothy [name] is a 61 y.o. female with history of osteoarthritis, DDD, osteoporosis, and fibromyalgia.  She reports that over the past 1-2 years she has had increased hand pain and joint swelling.  She states that she previously had an ultrasound performed, which did not reveal synovitis.  She would not like a repeat ultrasound.  She states the pain is in her bilateral MCP joints, which she said swell and are warm.  She has noticed decreased functionality due to pain and swelling.  She denies any joint swelling or stiffness in other joints.  She uses Voltaren gel on her hands, which helps temporarily.  She states her DDD in her neck and lumbar are stable and she has not had increased pain.  She states she is having bilateral SI joint pain, left worse than right and she would like a cortisone injection today.  She states her fibromyalgia is well controlled on Gabapentin.       Activities of Daily Living:  Patient reports morning stiffness for 30 minutes.   Patient Reports nocturnal pain.  Difficulty dressing/grooming: Denies Difficulty climbing stairs: Denies Difficulty getting out of chair: Denies Difficulty using hands for taps, buttons, cutlery, and/or writing: Reports   Review of Systems  Constitutional: Negative for fatigue and weakness.  HENT: Negative for mouth sores, mouth dryness and nose dryness.   Eyes: Negative for dryness.  Respiratory: Positive for difficulty breathing (Resolved.  Scheduled for stress test). Negative for cough and shortness of breath.   Cardiovascular: Negative for chest pain, palpitations, hypertension and swelling in legs/feet.  Gastrointestinal: Negative for blood  in stool, constipation and diarrhea.  Endocrine: Negative for increased urination.  Genitourinary: Negative for painful urination.  Musculoskeletal: Positive for arthralgias, joint pain, joint swelling and morning stiffness. Negative for myalgias, muscle weakness, muscle tenderness and myalgias.  Skin: Negative.  Negative for rash, hair loss and sensitivity to sunlight.  Neurological: Negative for dizziness, numbness and headaches.  Psychiatric/Behavioral: Negative for depressed mood and sleep disturbance. The patient is not nervous/anxious.     PMFS History:  Patient Active Problem List   Diagnosis Date Noted  . Seasonal allergic rhinitis due to pollen 05/11/2017  . Degenerative disc disease, cervical 09/20/2016  . Osteoporosis with current pathological fracture 06/19/2016  . Other secondary osteoarthritis of both hands 04/15/2016  . Osteoarthritis of pelvis 04/15/2016  . DDD (degenerative disc disease), lumbar 04/15/2016  . Neuropathy 04/15/2016  . IBS (irritable bowel syndrome) 04/15/2016  . Chronic diastolic heart failure ([place]) 06/14/2015  . Premature ventricular contractions 12/10/2013  . Fibromyalgia 10/03/2012  . Abdominal pain, epigastric 10/01/2012  . COLONIC POLYPS, ADENOMATOUS 09/21/2007  . DM2 (diabetes mellitus, type 2) ([place]) 09/21/2007  . HLD (hyperlipidemia) 09/21/2007  . Anxiety state 09/21/2007  . GERD 09/21/2007  . HIATAL HERNIA 09/21/2007  . CHOLEDOCHOLITHIASIS, HX OF 09/21/2007  . Acute gastritis 07/30/2007    Past Medical History:  Diagnosis Date  . Allergic rhinitis, cause unspecified   . Allergy   . Anxiety   . B12 deficiency   .  Benign paroxysmal positional vertigo   . Choledocholithiasis   . Congestive heart failure ([place])   . Depression   . Diabetes mellitus   . Fatty liver   . Fatty liver 09/04/12  . Fibromyalgia   . Gastritis   . GERD (gastroesophageal reflux disease)   . Hematuria, unspecified   . Hiatal hernia   . Hx of adenomatous colonic  polyps   . Hx of cardiovascular stress test    ETT-Myoview (12/15):  No ischemia.  No ECG changes.  EF 73%.  Low Risk.   [name] Hx of echocardiogram    Echo (12/15):  EF 60-65%, no RWMA, normal diast function, mild RVE  . Hyperlipidemia   . Meniere's disease, unspecified   . Migraine   . Mononeuritis of unspecified site   . Osteoarthritis   . Osteoporosis, unspecified   . Otosclerosis, unspecified   . Peptic ulcer, unspecified site, unspecified as acute or chronic, without mention of hemorrhage, perforation, or obstruction   . Symptomatic menopausal or female climacteric states   . Unspecified disorder of kidney and ureter     Family History  Problem Relation Age of Onset  . Prostate cancer Father   . Lung disease Father   . Diabetes Father   . Heart disease Father 36       CHF, AMI multiple/CABG  . COPD Father   . Heart attack Father   . Cancer Father        testicular cancer  . Diabetes Mother   . Hypertension Mother   . Hyperlipidemia Mother   . COPD Mother   . Cancer Mother        lung cancer  . Diabetes Brother   . Hypertension Brother   . Obesity Brother   . Hyperlipidemia Brother   . COPD Brother   . Stroke Maternal Grandmother   . Heart disease Maternal Grandfather   . COPD Paternal Grandmother   . Diabetes Paternal Grandmother   . COPD Paternal Grandfather   . Heart attack Paternal Grandfather   . Colon cancer Cousin        died age 43  . Breast cancer Maternal Aunt   . Colon cancer Cousin   . Colon polyps Neg Hx   . Esophageal cancer Neg Hx   . Rectal cancer Neg Hx   . Stomach cancer Neg Hx    Past Surgical History:  Procedure Laterality Date  . BILE DUCT EXPLORATION     gallstone removed  . BREAST ENHANCEMENT SURGERY Bilateral   . BUNIONECTOMY Left 12/2013  . CHOLECYSTECTOMY    . COLONOSCOPY    . FOOT SURGERY     right  . 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 MOUTH SURGERY    . SHOULDER SURGERY     left  . TOTAL ABDOMINAL HYSTERECTOMY     pelvic pain/scar tissue; ovaries intact.  . TUBAL LIGATION     Social History   Social History Narrative   Marital status:  Married x 22 years; happily married,no abuse. 2nd marriage.      Children: 3 sons (42, 60, 38)---2 sons with substance abuse; 3 grandchildren; 3 step grandchildren; 2 gg.      Lives: with husband, son/Brian.      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  7:45am-6:45pm  Tobacco:none       Alcohol:   1-2 drinks every week.      Drugs:none      Exercise: no exercise in 2018      Seatbelt: 100%; no texting      Guns: none      Always uses seat belts. Smoke alarm and carbon monoxide detector in the home.No caffeine use. No unsecured guns in the home.      Objective: Vital Signs: BP 134/75 (BP Location: Left Arm, Patient Position: Sitting, Cuff Size: Normal)   Pulse 69   Resp 14   Ht 5\' 5"  (1.651 m)   Wt 173 lb (78.5 kg)   BMI 28.79 kg/m    Physical Exam  Constitutional: She is oriented to person, place, and time. She appears well-developed and well-nourished.  HENT:  Head: Normocephalic and atraumatic.  Eyes: Conjunctivae and EOM are normal.  Neck: Normal range of motion.  Cardiovascular: Normal rate, regular rhythm, normal heart sounds and intact distal pulses.  Pulmonary/Chest: Effort normal and breath sounds normal.  Abdominal: Soft. Bowel sounds are normal.  Lymphadenopathy:    She has no cervical adenopathy.  Neurological: She is alert and oriented to person, place, and time.  Skin: Skin is warm and dry. Capillary refill takes less than 2 seconds.  Psychiatric: She has a normal mood and affect. Her behavior is normal.  Nursing note and vitals reviewed.    Musculoskeletal Exam: C-spine limited ROM with lateral rotation.  Thoracic and lumbar spine  good ROM.  No midline spinal tenderness.  SI joint tenderness bilaterally, worse on the left side.  Shoulder joints, elbow joints, wrist joints, MCPs, PIPs, and DIPs good ROM with no synovitis. Tenderness of bilateral MCP joints.  Hip joints, knee joints, ankle joints, MTPs, PIPs, and DIPs good ROM with no synovitis.    CDAI Exam: No CDAI exam completed.    Investigation: No additional findings. CBC Latest Ref Rng & Units 05/09/2017 01/31/2017 10/25/2016  WBC 3.4 - 10.8 x10E3/uL 4.5 4.8 4.3  Hemoglobin 11.1 - 15.9 g/dL 13.5 14.1 14.1  Hematocrit 34.0 - 46.6 % 42.2 42.6 41.2  Platelets 150 - 379 x10E3/uL 216 214 204   CMP Latest Ref Rng & Units 05/09/2017 01/31/2017 10/25/2016  Glucose 65 - 99 mg/dL 200(H) 160(H) 150(H)  BUN 8 - 27 mg/dL 17 18 23   Creatinine 0.57 - 1.00 mg/dL 0.89 0.90 1.00  Sodium 134 - 144 mmol/L 142 137 141  Potassium 3.5 - 5.2 mmol/L 3.8 4.1 4.1  Chloride 96 - 106 mmol/L 100 97 98  CO2 20 - 29 mmol/L 20 22 24   Calcium 8.7 - 10.3 mg/dL 9.6 9.5 9.1  Total Protein 6.0 - 8.5 g/dL 6.6 6.6 6.5  Total Bilirubin 0.0 - 1.2 mg/dL 0.4 0.4 0.5  Alkaline Phos 39 - 117 IU/L 73 69 53  AST 0 - 40 IU/L 42(H) 38 19  ALT 0 - 32 IU/L 52(H) 50(H) 36(H)    Imaging: Xr Hand 2 View Left  Result Date: 09/05/2017 PIP and DIP narrowing was noted. No MCP changes were noted. CMC joint narrowing was noted. No intercarpal radiocarpal joint space narrowing was noted. No erosive changes were noted. Impression: These findings are consistent with osteoarthritis.  Xr Hand 2 View Right  Result Date: 09/05/2017 PIP and DIP narrowing was noted. No MCP changes were noted. CMC joint narrowing was noted. No intercarpal radiocarpal joint space narrowing was noted. No erosive changes were noted. Impression: These findings are consistent with osteoarthritis.  Speciality Comments: No specialty comments available.    Procedures:  Sacroiliac Joint Inj on 09/05/2017 11:48 AM Indications: pain Details: 27 G  1.5 in needle, posterior approach Medications: 1 mL lidocaine 1 %; 40 mg triamcinolone acetonide 40 MG/ML Aspirate: 0 mL Outcome: tolerated well, no immediate complications Procedure, treatment alternatives, risks and benefits explained, specific risks discussed. Consent was given by the patient. Immediately prior to procedure a time out was called to verify the correct patient, procedure, equipment, support staff and site/side marked as required. Patient was prepped and draped in the usual sterile fashion.     Allergies: Bactrim [sulfamethoxazole-trimethoprim]; Keflex [cephalexin]; Macrobid [nitrofurantoin macrocrystal]; Nitrofurantoin; Sulfa drugs cross reactors; Adhesive [tape]; Codeine; and Talwin [pentazocine]   Assessment / Plan:     Visit Diagnoses: Fibromyalgia: Well-controlled.  She does not have any generalized pain at this time.  She continues to take Gabapentin. She has not been experiencing insomnia or fatigue recently.       DDD (degenerative disc disease), lumbar: Stable.  Continues to cause discomfort.  No midline spinal tenderness.  DDD (degenerative disc disease), cervical: Stable.  Limited lateral rotation.    Osteoarthritis of pelvis: No discomfort at this time.  Good Hip ROM bilaterally.   Primary osteoarthritis of both hands: She has no synovitis on exam.  She reports increased hand pain and swelling.  Previous ultrasound was negative for synovitis.  She would not like a repeat ultrasound or labs to rule out RA at this time.  She can continue to use Voltaren gel.    Bilateral hand pain - X-rays findings are consistent with osteoarthritis.  She has no erosive changes or MCP joint space narrowing. Plan: XR Hand 2 View Left, XR Hand 2 View Right  Chronic left SI joint pain: She has tenderness of bilateral SI joint, worse on the left side.  She requested a cortisone injection of her left SI joint.  She tolerated the procedure well.    Other osteoporosis with current  pathological fracture, sequela: She takes Fosamax.    Other medical conditions were listed as follows:   History of diabetes mellitus  History of IBS  History of gastroesophageal reflux (GERD)  History of neuropathy  History of anxiety  History of hyperlipidemia     Orders: Orders Placed This Encounter  Procedures  . Sacroiliac Joint Inj  . XR Hand 2 View Left  . XR Hand 2 View Right   No orders of the defined types were placed in this encounter.     Follow-Up Instructions: Return in about 6 months (around 03/05/2018) for Osteoarthritis, Osteoporosis, Fibromyalgia.   Bo Merino, MD  Note - This record has been created using Editor, commissioning.  Chart creation errors have been sought, but may not always  have been located. Such creation errors do not reflect on  the standard of medical care.

## 2017-09-05 ENCOUNTER — Ambulatory Visit (INDEPENDENT_AMBULATORY_CARE_PROVIDER_SITE_OTHER): Payer: Self-pay

## 2017-09-05 ENCOUNTER — Ambulatory Visit: Payer: 59 | Admitting: Rheumatology

## 2017-09-05 ENCOUNTER — Ambulatory Visit: Payer: 59 | Admitting: Family Medicine

## 2017-09-05 ENCOUNTER — Ambulatory Visit: Payer: 59 | Admitting: Neurology

## 2017-09-05 ENCOUNTER — Encounter: Payer: Self-pay | Admitting: Family Medicine

## 2017-09-05 ENCOUNTER — Encounter: Payer: Self-pay | Admitting: Rheumatology

## 2017-09-05 ENCOUNTER — Other Ambulatory Visit: Payer: Self-pay

## 2017-09-05 VITALS — BP 130/82 | HR 85 | Temp 98.0°F | Resp 16 | Ht 65.75 in | Wt 171.0 lb

## 2017-09-05 VITALS — BP 134/75 | HR 69 | Resp 14 | Ht 65.0 in | Wt 173.0 lb

## 2017-09-05 DIAGNOSIS — R0609 Other forms of dyspnea: Secondary | ICD-10-CM | POA: Diagnosis not present

## 2017-09-05 DIAGNOSIS — Z8639 Personal history of other endocrine, nutritional and metabolic disease: Secondary | ICD-10-CM | POA: Diagnosis not present

## 2017-09-05 DIAGNOSIS — M797 Fibromyalgia: Secondary | ICD-10-CM

## 2017-09-05 DIAGNOSIS — Z8719 Personal history of other diseases of the digestive system: Secondary | ICD-10-CM

## 2017-09-05 DIAGNOSIS — M8080XS Other osteoporosis with current pathological fracture, unspecified site, sequela: Secondary | ICD-10-CM | POA: Diagnosis not present

## 2017-09-05 DIAGNOSIS — E1121 Type 2 diabetes mellitus with diabetic nephropathy: Secondary | ICD-10-CM | POA: Diagnosis not present

## 2017-09-05 DIAGNOSIS — M19042 Primary osteoarthritis, left hand: Secondary | ICD-10-CM

## 2017-09-05 DIAGNOSIS — G8929 Other chronic pain: Secondary | ICD-10-CM | POA: Diagnosis not present

## 2017-09-05 DIAGNOSIS — M5030 Other cervical disc degeneration, unspecified cervical region: Secondary | ICD-10-CM | POA: Diagnosis not present

## 2017-09-05 DIAGNOSIS — M5136 Other intervertebral disc degeneration, lumbar region: Secondary | ICD-10-CM

## 2017-09-05 DIAGNOSIS — R946 Abnormal results of thyroid function studies: Secondary | ICD-10-CM | POA: Diagnosis not present

## 2017-09-05 DIAGNOSIS — R0789 Other chest pain: Secondary | ICD-10-CM | POA: Diagnosis not present

## 2017-09-05 DIAGNOSIS — M79641 Pain in right hand: Secondary | ICD-10-CM | POA: Diagnosis not present

## 2017-09-05 DIAGNOSIS — M1610 Unilateral primary osteoarthritis, unspecified hip: Secondary | ICD-10-CM

## 2017-09-05 DIAGNOSIS — M533 Sacrococcygeal disorders, not elsewhere classified: Secondary | ICD-10-CM

## 2017-09-05 DIAGNOSIS — M19041 Primary osteoarthritis, right hand: Secondary | ICD-10-CM | POA: Diagnosis not present

## 2017-09-05 DIAGNOSIS — Z8659 Personal history of other mental and behavioral disorders: Secondary | ICD-10-CM

## 2017-09-05 DIAGNOSIS — R0600 Dyspnea, unspecified: Secondary | ICD-10-CM

## 2017-09-05 DIAGNOSIS — Z8669 Personal history of other diseases of the nervous system and sense organs: Secondary | ICD-10-CM

## 2017-09-05 DIAGNOSIS — E7800 Pure hypercholesterolemia, unspecified: Secondary | ICD-10-CM | POA: Diagnosis not present

## 2017-09-05 DIAGNOSIS — F411 Generalized anxiety disorder: Secondary | ICD-10-CM | POA: Diagnosis not present

## 2017-09-05 DIAGNOSIS — M79642 Pain in left hand: Secondary | ICD-10-CM

## 2017-09-05 LAB — POCT URINALYSIS DIP (MANUAL ENTRY)
Bilirubin, UA: NEGATIVE
Glucose, UA: 500 mg/dL — AB
LEUKOCYTES UA: NEGATIVE
NITRITE UA: NEGATIVE
PH UA: 5.5 (ref 5.0–8.0)
Protein Ur, POC: NEGATIVE mg/dL
RBC UA: NEGATIVE
Spec Grav, UA: 1.01 (ref 1.010–1.025)
UROBILINOGEN UA: 0.2 U/dL

## 2017-09-05 MED ORDER — TRIAMCINOLONE ACETONIDE 40 MG/ML IJ SUSP
40.0000 mg | INTRAMUSCULAR | Status: AC | PRN
  Administered 2017-09-05: 40 mg via INTRA_ARTICULAR

## 2017-09-05 MED ORDER — ZOSTER VAC RECOMB ADJUVANTED 50 MCG/0.5ML IM SUSR: 0.5000 mL | Freq: Once | INTRAMUSCULAR | 1 refills | Status: AC

## 2017-09-05 MED ORDER — LIDOCAINE HCL 1 % IJ SOLN
1.0000 mL | INTRAMUSCULAR | Status: AC | PRN
  Administered 2017-09-05: 1 mL

## 2017-09-05 NOTE — Progress Notes (Signed)
Subjective:    Patient ID: Dorothy [name], female    DOB: February 15, 1956, 63 y.o.   MRN: [id number]  09/05/2017  Diabetes (3 month follow-up ); Anxiety; and Hyperlipidemia    HPI This 62 y.o. female presents for four month follow-up DMII, dyslipidemia, myofascial pain disorder.  Management changes made at the last visit include the following: -recommend Imodium daily to bid for chronic diarrhea. -also recommend decreased Metformin to 500mg  bid due to diarrhea; add Jardiance. -new onset sinusitis; fill if no improvement in upcoming week.   RIGHT toe third swelling:  Not sure if injured; doing a lot better; has been really sore.  Very red at end. Toenail has gotten thick.  Bruised along R lateral aspect of toe.  In September went down a water slide; hit husband going down the slide.  Doing a lot better.  Feeling much better.  Not checking sugars at all.  Has meter but not checking; out of test strips; does not know what type of meter.   Glucometer is Embrace. Tolerating Farxiga well.  No problems. Metformin 500mg  bid; decreased at last visit; no diarrhea.  Had colonoscopy in November 2018.  Having a hard time swallowing Colestipol; switched to pill form.  Doing really well with diarrhea.    Two months ago, had SOB and chest tightness.  Did not start until at work; was filing charts away and stopped down in floor.  Filing bottom drawer and then caught all of a sudden.  Had to rest; thought was going to get nauseated.  Breathing was labored; had to use inhaler with improvement.  Lasted 2-3 days. Trigger was bending over.  Had been eating a lot of popcorn.   Last stress testing 2015.  Went to grocery store and suffered DOE and would have chest tightness.  No swelling.   Last visit with cardiology in July 2018.  No exercise.  BP Readings from Last 3 Encounters:  09/12/17 120/80  09/05/17 134/75  09/05/17 130/82   Wt Readings from Last 3 Encounters:  09/12/17 171 lb (77.6 kg)  09/05/17 173  lb (78.5 kg)  09/05/17 171 lb (77.6 kg)   Immunization History  Administered Date(s) Administered  . Hepatitis B 12/14/2011, 06/11/2012, 01/07/2013  . Influenza Split 06/11/2012  . Influenza,inj,Quad PF,6+ Mos 07/28/2013, 07/15/2014, 06/14/2015, 07/26/2016, 05/09/2017  . Pneumococcal Polysaccharide-23 06/14/2015  . Pneumococcal-Unspecified 08/29/2007  . Tdap 08/28/2008    Review of Systems  Constitutional: Negative for chills, diaphoresis, fatigue and fever.  HENT: Negative for congestion, ear discharge, ear pain, postnasal drip, rhinorrhea, sinus pressure, sinus pain, sore throat, tinnitus, trouble swallowing and voice change.   Eyes: Negative for visual disturbance.  Respiratory: Negative for cough, shortness of breath and wheezing.   Cardiovascular: Negative for chest pain, palpitations and leg swelling.  Gastrointestinal: Negative for abdominal pain, anal bleeding, blood in stool, constipation, diarrhea, nausea, rectal pain and vomiting.  Endocrine: Negative for cold intolerance, heat intolerance, polydipsia, polyphagia and polyuria.  Musculoskeletal: Positive for arthralgias and joint swelling.  Neurological: Negative for dizziness, tremors, seizures, syncope, facial asymmetry, speech difficulty, weakness, light-headedness, numbness and headaches.  Psychiatric/Behavioral: Negative for dysphoric mood, self-injury, sleep disturbance and suicidal ideas. The patient is not nervous/anxious.     Past Medical History:  Diagnosis Date  . Allergic rhinitis, cause unspecified   . Allergy   . Anxiety   . B12 deficiency   . Benign paroxysmal positional vertigo   . Choledocholithiasis   . Congestive heart failure ([place])   .  Depression   . Diabetes mellitus   . Fatty liver   . Fatty liver 09/04/12  . Fibromyalgia   . Gastritis   . GERD (gastroesophageal reflux disease)   . Hematuria, unspecified   . Hiatal hernia   . Hx of adenomatous colonic polyps   . Hx of cardiovascular stress  test    ETT-Myoview (12/15):  No ischemia.  No ECG changes.  EF 73%.  Low Risk.   [name] Hx of echocardiogram    Echo (12/15):  EF 60-65%, no RWMA, normal diast function, mild RVE  . Hyperlipidemia   . Meniere's disease, unspecified   . Migraine   . Mononeuritis of unspecified site   . Osteoarthritis   . Osteoporosis, unspecified   . Otosclerosis, unspecified   . Peptic ulcer, unspecified site, unspecified as acute or chronic, without mention of hemorrhage, perforation, or obstruction   . Symptomatic menopausal or female climacteric states   . Unspecified disorder of kidney and ureter    Past Surgical History:  Procedure Laterality Date  . BILE DUCT EXPLORATION     gallstone removed  . BREAST ENHANCEMENT SURGERY Bilateral   . BUNIONECTOMY Left 12/2013  . CHOLECYSTECTOMY    . COLONOSCOPY    . FOOT SURGERY     right  . 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 MOUTH SURGERY    . SHOULDER SURGERY     left  . TOTAL ABDOMINAL HYSTERECTOMY     pelvic pain/scar tissue; ovaries intact.  . TUBAL LIGATION     Allergies  Allergen Reactions  . Bactrim [Sulfamethoxazole-Trimethoprim] Other (See Comments)    blisters  . Keflex [Cephalexin] Hives  . Macrobid [Nitrofurantoin Macrocrystal] Other (See Comments)    blisters  . Nitrofurantoin Nausea And Vomiting and Other (See Comments)    Blisters   . Sulfa Drugs Cross Reactors Itching  . Adhesive [Tape] Other (See Comments)    Tears skins  . Codeine Itching  . Talwin [Pentazocine] Nausea And Vomiting and Rash    Muscle cramps and vision changes   Current Outpatient Medications on File Prior to Visit  Medication Sig Dispense Refill  . aspirin 81 MG tablet Take 81 mg by mouth every evening.    . Calcium Carb-Cholecalciferol (CALCIUM 500 +D) 500-400 MG-UNIT TABS  Take 1 tablet by mouth daily.     . colestipol (COLESTID) 1 g tablet Take 1 tablet (1 g total) 2 (two) times daily by mouth. 60 tablet 3  . dapagliflozin propanediol (FARXIGA) 10 MG TABS tablet Take 10 mg by mouth daily. 90 tablet 3  . DEXILANT 60 MG capsule TAKE ONE CAPSULE BY MOUTH DAILY 30 capsule 10  . fluticasone (FLONASE) 50 MCG/ACT nasal spray Place 2 sprays into both nostrils daily. 16 g 6  . gabapentin (NEURONTIN) 300 MG capsule TAKE ONE CAPSULE BY MOUTH THREE TIMES A DAY 90 capsule 0  . hydrochlorothiazide (HYDRODIURIL) 25 MG tablet TAKE ONE TABLET BY MOUTH DAILY 30 tablet 0  . JANUVIA 100 MG tablet TAKE ONE TABLET BY MOUTH DAILY 30 tablet 5  . losartan (COZAAR) 50 MG tablet TAKE ONE TABLET BY MOUTH DAILY 30 tablet 5  . metaxalone (SKELAXIN) 800 MG tablet Take 800 mg by mouth 3 (three) times daily.    . metFORMIN (GLUCOPHAGE) 500 MG tablet Take 1 tablet (500 mg total)  by mouth 2 (two) times daily with a meal. 180 tablet 3  . Misc Natural Products (DANDELION ROOT) 520 MG CAPS Take by mouth 2 (two) times daily after a meal.    . omega-3 acid ethyl esters (LOVAZA) 1 g capsule TAKE ONE CAPSULE BY MOUTH TWICE A DAY 60 capsule 8  . Potassium Chloride ER 20 MEQ TBCR TAKE TWO TABLETS BY MOUTH DAILY 60 tablet 9  . simvastatin (ZOCOR) 40 MG tablet TAKE ONE TABLET BY MOUTH EVERY NIGHT AT BEDTIME 30 tablet 1  . venlafaxine XR (EFFEXOR-XR) 75 MG 24 hr capsule TAKE ONE CAPSULE BY MOUTH EVERY EVENING 90 capsule 1  . VOLTAREN 1 % GEL Apply 2 g topically 3 (three) times daily as needed (pain).      No current facility-administered medications on file prior to visit.    Social History   Socioeconomic History  . Marital status: Married    Spouse name: Richardson Landry  . Number of children: 3  . Years of education: 10th grade  . Highest education level: Not on file  Social Needs  . Financial resource strain: Not on file  . Food insecurity - worry: Not on file  . Food insecurity - inability: Not on file    . Transportation needs - medical: Not on file  . Transportation needs - non-medical: Not on file  Occupational History    Employer: [employer]  Tobacco Use  . Smoking status: Never Smoker  . Smokeless tobacco: Never Used  Substance and Sexual Activity  . Alcohol use: Yes    Alcohol/week: 0.0 oz    Comment: occasional   . Drug use: No  . Sexual activity: Yes    Birth control/protection: Post-menopausal, Surgical  Other Topics Concern  . Not on file  Social History Narrative   Marital status:  Married x 22 years; happily married,no abuse. 2nd marriage.      Children: 3 sons (42, 65, 38)---2 sons with substance abuse; 3 grandchildren; 3 step grandchildren; 2 gg.      Lives: with husband, son/Brian.      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      Drugs:none      Exercise: no exercise in 2018      Seatbelt: 100%; no texting      Guns: none      Always uses seat belts. Smoke alarm and carbon monoxide detector in the home.No caffeine use. No unsecured guns in the home.    Family History  Problem Relation Age of Onset  . Prostate cancer Father   . Lung disease Father   . Diabetes Father   . Heart disease Father 70       CHF, AMI multiple/CABG  . COPD Father   . Heart attack Father   . Cancer Father        testicular cancer  . Diabetes Mother   . Hypertension Mother   . Hyperlipidemia Mother   . COPD Mother   . Cancer Mother        lung cancer  . Diabetes Brother   . Hypertension Brother   . Obesity Brother   . Hyperlipidemia Brother   . COPD Brother   . Stroke Maternal Grandmother   . Heart disease Maternal Grandfather   . COPD Paternal Grandmother   . Diabetes Paternal Grandmother   . COPD  Paternal Grandfather   . Heart attack Paternal Grandfather   . Colon cancer Cousin        died age 70  . Breast cancer Maternal Aunt   . Colon cancer Cousin   .  Colon polyps Neg Hx   . Esophageal cancer Neg Hx   . Rectal cancer Neg Hx   . Stomach cancer Neg Hx        Objective:    BP 130/82   Pulse 85   Temp 98 F (36.7 C) (Oral)   Resp 16   Ht 5' 5.75" (1.67 m)   Wt 171 lb (77.6 kg)   SpO2 97%   BMI 27.81 kg/m  Physical Exam  Constitutional: She is oriented to person, place, and time. She appears well-developed and well-nourished. No distress.  HENT:  Head: Normocephalic and atraumatic.  Right Ear: External ear normal.  Left Ear: External ear normal.  Nose: Nose normal.  Mouth/Throat: Oropharynx is clear and moist.  Eyes: Conjunctivae and EOM are normal. Pupils are equal, round, and reactive to light.  Neck: Normal range of motion. Neck supple. Carotid bruit is not present. No thyromegaly present.  Cardiovascular: Normal rate, regular rhythm, normal heart sounds and intact distal pulses. Exam reveals no gallop and no friction rub.  No murmur heard. Pulmonary/Chest: Effort normal and breath sounds normal. She has no wheezes. She has no rales.  Abdominal: Soft. Bowel sounds are normal. She exhibits no distension and no mass. There is no tenderness. There is no rebound and no guarding.  Musculoskeletal:  Right third toe with diffuse swelling.  Decreased range of motion yet painless.  No ecchymoses.  No erythema.  No streaking.  No fluctuance.  Nail intact and without abnormality.  Lymphadenopathy:    She has no cervical adenopathy.  Neurological: She is alert and oriented to person, place, and time. No cranial nerve deficit. She exhibits normal muscle tone. Coordination normal.  Skin: Skin is warm and dry. No rash noted. She is not diaphoretic. No erythema. No pallor.  Psychiatric: She has a normal mood and affect. Her behavior is normal. Judgment and thought content normal.   No results found. Depression screen [facility] 2/9 09/05/2017 05/09/2017 02/19/2017 01/31/2017 10/25/2016  Decreased Interest 0 0 0 0 0  Down, Depressed, Hopeless 0 0 0 0 0   PHQ - 2 Score 0 0 0 0 0   Fall Risk  09/05/2017 05/09/2017 02/19/2017 01/31/2017 10/25/2016  Falls in the past year? No Yes Yes No No  Number falls in past yr: - 1 1 - -  Injury with Fall? - No No - -        Assessment & Plan:   1. Chest tightness   2. Anxiety state   3. Dyspnea on exertion   4. Pure hypercholesterolemia   5. Fibromyalgia   6. Type 2 diabetes mellitus with diabetic nephropathy, without long-term current use of insulin ([place])   7. Thyroid function study abnormality    Diabetes mellitus stable.  Diarrhea has improved with decreased metformin dose as well as adjustments per gastroenterology for IBS.  Tolerating far [place] without side effects.  Obtain labs for chronic disease management.  New onset chest pain with shortness of breath and diaphoresis 1 month ago.  Some atypical features however some concerning cardiac features to chest pain.  Refer to cardiology.  Obtain EKG today.  Obtain labs.  Advised to present to emergency department for recurrence of chest pain.  Hypercholesterolemia well controlled at this  time.  Obtain labs.  Continue current medications.  Thyroid function slightly abnormal at last visit.  We will repeat today.  Fibromyalgia stable.  Managed by rheumatology.  Orders Placed This Encounter  Procedures  . CBC with Differential/Platelet  . Comprehensive metabolic panel    Order Specific Question:   Has the patient fasted?    Answer:   No  . Lipid panel    Order Specific Question:   Has the patient fasted?    Answer:   No  . Hemoglobin A1c  . TSH  . Ambulatory referral to Cardiology    Referral Priority:   Routine    Referral Type:   Consultation    Referral Reason:   Specialty Services Required    Requested Specialty:   Cardiology    Number of Visits Requested:   1  . POCT urinalysis dipstick  . EKG 12-Lead   Meds ordered this encounter  Medications  . Zoster Vaccine Adjuvanted [facility]) injection    Sig: Inject 0.5 mLs into the muscle  once for 1 dose.    Dispense:  0.5 mL    Refill:  1    Return in about 3 months (around 12/04/2017) for follow-up chronic medical conditions.   Raysha Tilmon Elayne Guerin, M.D. Primary Care at [facility] previously [facility] [address] [place], [place]  [address] [phone] phone [phone] fax

## 2017-09-05 NOTE — Patient Instructions (Signed)
     IF you received an x-ray today, you will receive an invoice from [facility]. Please contact [facility] at [phone] with questions or concerns regarding your invoice.   IF you received labwork today, you will receive an invoice from [facility]. Please contact [facility] at [phone] with questions or concerns regarding your invoice.   Our billing staff will not be able to assist you with questions regarding bills from these companies.  You will be contacted with the lab results as soon as they are available. The fastest way to get your results is to activate your My Chart account. Instructions are located on the last page of this paperwork. If you have not heard from us regarding the results in 2 weeks, please contact this office.     

## 2017-09-06 LAB — COMPREHENSIVE METABOLIC PANEL
A/G RATIO: 2.1 (ref 1.2–2.2)
ALK PHOS: 82 IU/L (ref 39–117)
ALT: 38 IU/L — AB (ref 0–32)
AST: 23 IU/L (ref 0–40)
Albumin: 4.6 g/dL (ref 3.6–4.8)
BILIRUBIN TOTAL: 0.5 mg/dL (ref 0.0–1.2)
BUN/Creatinine Ratio: 25 (ref 12–28)
BUN: 23 mg/dL (ref 8–27)
CHLORIDE: 99 mmol/L (ref 96–106)
CO2: 23 mmol/L (ref 20–29)
Calcium: 9.9 mg/dL (ref 8.7–10.3)
Creatinine, Ser: 0.93 mg/dL (ref 0.57–1.00)
GFR calc Af Amer: 77 mL/min/{1.73_m2} (ref >59)
GFR calc non Af Amer: 67 mL/min/{1.73_m2} (ref >59)
Globulin, Total: 2.2 g/dL (ref 1.5–4.5)
Glucose: 193 mg/dL — ABNORMAL HIGH (ref 65–99)
POTASSIUM: 3.5 mmol/L (ref 3.5–5.2)
Sodium: 139 mmol/L (ref 134–144)
Total Protein: 6.8 g/dL (ref 6.0–8.5)

## 2017-09-06 LAB — LIPID PANEL
Chol/HDL Ratio: 2.9 ratio (ref 0.0–4.4)
Cholesterol, Total: 189 mg/dL (ref 100–199)
HDL: 65 mg/dL (ref >39)
LDL Calculated: 84 mg/dL (ref 0–99)
TRIGLYCERIDES: 202 mg/dL — AB (ref 0–149)
VLDL Cholesterol Cal: 40 mg/dL (ref 5–40)

## 2017-09-06 LAB — CBC WITH DIFFERENTIAL/PLATELET
BASOS: 0 %
Basophils Absolute: 0 10*3/uL (ref 0.0–0.2)
EOS (ABSOLUTE): 0 10*3/uL (ref 0.0–0.4)
Eos: 1 %
Hematocrit: 43.3 % (ref 34.0–46.6)
Hemoglobin: 14.6 g/dL (ref 11.1–15.9)
IMMATURE GRANULOCYTES: 0 %
Immature Grans (Abs): 0 10*3/uL (ref 0.0–0.1)
Lymphocytes Absolute: 1.4 10*3/uL (ref 0.7–3.1)
Lymphs: 31 %
MCH: 30.8 pg (ref 26.6–33.0)
MCHC: 33.7 g/dL (ref 31.5–35.7)
MCV: 91 fL (ref 79–97)
MONOS ABS: 0.4 10*3/uL (ref 0.1–0.9)
Monocytes: 8 %
NEUTROS ABS: 2.7 10*3/uL (ref 1.4–7.0)
NEUTROS PCT: 60 %
PLATELETS: 213 10*3/uL (ref 150–379)
RBC: 4.74 x10E6/uL (ref 3.77–5.28)
RDW: 13.2 % (ref 12.3–15.4)
WBC: 4.6 10*3/uL (ref 3.4–10.8)

## 2017-09-06 LAB — HEMOGLOBIN A1C
Est. average glucose Bld gHb Est-mCnc: 200 mg/dL
Hgb A1c MFr Bld: 8.6 % — ABNORMAL HIGH (ref 4.8–5.6)

## 2017-09-06 LAB — TSH: TSH: 4.19 u[IU]/mL (ref 0.450–4.500)

## 2017-09-10 ENCOUNTER — Other Ambulatory Visit: Payer: Self-pay | Admitting: Family Medicine

## 2017-09-10 MED ORDER — DULAGLUTIDE 0.75 MG/0.5ML ~~LOC~~ SOAJ: 0.7500 mg | SUBCUTANEOUS | 5 refills | Status: AC

## 2017-09-10 NOTE — Progress Notes (Signed)
Trulicity prescribed.

## 2017-09-12 ENCOUNTER — Telehealth: Payer: Self-pay | Admitting: Family Medicine

## 2017-09-12 ENCOUNTER — Encounter: Payer: Self-pay | Admitting: Neurology

## 2017-09-12 ENCOUNTER — Ambulatory Visit: Payer: 59 | Admitting: Neurology

## 2017-09-12 DIAGNOSIS — G2581 Restless legs syndrome: Secondary | ICD-10-CM

## 2017-09-12 MED ORDER — PRAMIPEXOLE DIHYDROCHLORIDE 0.125 MG PO TABS: 0.3750 mg | ORAL_TABLET | Freq: Every day | ORAL | 3 refills | Status: AC

## 2017-09-12 NOTE — Patient Instructions (Addendum)
As discussed, we will increase your Mirapex generic to 3 pills at night. You can continue to take it around 7:30 PM.

## 2017-09-12 NOTE — Telephone Encounter (Signed)
Copied from [place] [id number]. Topic: General - Other >> Sep 12, 2017  4:44 PM Patrice Paradise wrote: Reason for CRM: Patient rec'd a called from the office yesterday about her labs and meds, but she do not remember who she spoke with. Patient is requesting a call back from Dr. Tamala Julian assistant @ [phone].

## 2017-09-12 NOTE — Progress Notes (Signed)
Subjective:    Patient ID: Dorothy [name] is a 62 y.o. female.  HPI     Interim history:   Dorothy [name] is a 62 year old right-handed woman with an underlying complex medical history of diabetes, hyperlipidemia, depression, fatty liver, reflux disease, anxiety, gallstones, migraine headaches, osteoarthritis, peptic ulcer disease, vitamin B12 deficiency, Mnire's disease and benign positional vertigo, allergic rhinitis, hiatal hernia and fibromyalgia, who presents for followup consultation of her RLS. She is unaccompanied today. I last saw her on 61/27/17, at which time she was doing reasonably well with generic Mirapex low-dose at night. She was taking Mirapex generic 0.25 mg each night. I suggested a one-year checkup.  Today, 09/12/2017: She reports that her restless leg symptoms seem to be more frequent. She takes 2 pills of the Mirapex at night still. She has had not major changes to medications, but will but be starting Trulicity and stop the Januvia, last A1c from last week was 8.6. She works as a Research scientist (physical sciences). She stands a lot. Weight has been stable. She sees her cardiologist once a year. She has had more issues with arthritis affecting both hands. She sees rheumatologist for this.  Previously:    I saw her on 07/08/2015, at which time she reported doing well on Mirapex, on just 0.25 mg each night. She had no SEs. She had a follow-up with Dr. Burt Knack in cardiology in June 2016. She quit her job.    I saw her on 12/10/2014, at which time she reported that she was diagnosed with congestive heart failure, in November 2015. She had developed shortness of breath about 6 months prior. She had seen a cardiologist. Her symptoms gradually worsened. She had to go to the emergency room on 07/27/2014 because of sudden worsening of shortness of breath. She was diagnosed with CHF. She had a CXR 2 views on 07/27/14: 1. Cardiomegaly and pulmonary venous congestion without definite evidence of edema. 2.  Slight worsening bibasilar atelectasis, left greater than right.  She had a nuclear stress test on 08/14/2014 which was negative. She had an echocardiogram on 08/04/2014: EF was 60-65%, wall motion was normal, all in all it was a normal study. She was started on Lasix and was able to lose several fluid pounds. She had developed lower extremity edema which improved. Her breathing improved. As far as her RLS, unfortunately she had started noticing itching and burning at the site of the patches and while the neupro was still helpful, the site reactions were troublesome. She started reducing the time the patch was on down from 24 hours to about 8 hours per day. She noticed that her restless leg symptoms flared up with that. She was on an iron supplement. I asked her to discontinue Neupro patch altogether. I suggested we start her on Mirapex low-dose with slow titration, 0.125 mg strength.   I saw her on 06/09/2014, at which time she reported doing better with Neupro. She was on the 2 mg patch. She had no side effects and felt she was sleeping much better. She reported that her little dog unfortunately found one of her old patches that fell off while she was changing and chewed on it and apparently had a overdose type reaction to it. She had to rush him to the vet. Since then she was very careful in removing and applying the patch. She noticed that one time when she forgot to change the patch she had a flareup of her RLS symptoms. She was taking iron supplements. She was  trying to lose weight and joined a gym. She was on a second diabetes medication per primary care physician.    I saw her on 01/09/2014, at which time she reported significant RLS symptoms and difficulty with sleep onset as well as sleep maintenance. Sleep issues have been a long-standing problem for her. She also reported a recent cardiac workup and other than PVCs there were no abnormalities found. I talked her about her sleep test results in  detail. I suggested we check iron studies. Her ferritin level was 10 and we called her with the test results. I advised her to start an over-the-counter iron supplement. Her hemoglobin and hematocrit were slightly low which were checked by her primary care provider a few days later. I also suggested she start Neupro patch for symptomatic treatment of RLS. I started her on 1 mg strength with increased to 2 mg. She has been diagnosed with diabetes in the interim. She follows with Dr. Estanislado Pandy for arthritis.    I first met her on 11/13/2013 at the request of her primary care physician, at which time she reported intermittent shortness of breath, palpitations, indigestion, and excessive daytime somnolence, with a need for daily nap after work between 2 and 6 PM. She also reported restless leg symptoms and jerking in her sleep. She reported having had a sleep study many years ago which she recalled showed snoring. 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 She has braces on top, which may come out soon. 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 She is a non-smoker and drinks alcohol very occasionally and does not drink caffeine. She lost about 15 lb from the jaw surgery, but has mostly gained it back. I suggested she return for sleep study.    She she had a baseline sleep study on 12/17/2013: Sleep efficiency was reduced at 62.9% latency to sleep of 73 minutes and wake after sleep onset of 77 minutes with moderate to severe sleep fragmentation noted. She had an elevated arousal index of 28.5 per hour primarily because of periodic leg movements. She had a normal percentage of stage I sleep, a markedly increased percentage of stage II sleep, and absence of slow-wave and REM sleep. She had severe periodic leg movements of sleep. Index was 76.9 per hour with an associated arousal index of 21.5 per hour. She  had no significant EKG changes. Minimal intermittent snoring was noted. She slept mostly in the lateral positions. She had a total AHI of 4 per hour, rising to 25.4 per hour in the supine position. Baseline oxygen saturation was 95%, nadir was 90%. I felt that the absence of REM sleep and her estimated her potential underlying sleep disorder breathing. I felt that her Effexor may be a contributor to her restless leg symptoms and PLMS.    Her Past Medical History Is Significant For: Past Medical History:  Diagnosis Date  . Allergic rhinitis, cause unspecified   . Allergy   . Anxiety   . B12 deficiency   . Benign paroxysmal positional vertigo   . Choledocholithiasis   . Congestive heart failure ([place])   . Depression   . Diabetes mellitus   . Fatty liver   . Fatty liver 09/04/12  . Fibromyalgia   . Gastritis   . GERD (gastroesophageal reflux disease)   . Hematuria, unspecified   . Hiatal hernia   . Hx of adenomatous  colonic polyps   . Hx of cardiovascular stress test    ETT-Myoview (12/15):  No ischemia.  No ECG changes.  EF 73%.  Low Risk.   [name] Hx of echocardiogram    Echo (12/15):  EF 60-65%, no RWMA, normal diast function, mild RVE  . Hyperlipidemia   . Meniere's disease, unspecified   . Migraine   . Mononeuritis of unspecified site   . Osteoarthritis   . Osteoporosis, unspecified   . Otosclerosis, unspecified   . Peptic ulcer, unspecified site, unspecified as acute or chronic, without mention of hemorrhage, perforation, or obstruction   . Symptomatic menopausal or female climacteric states   . Unspecified disorder of kidney and ureter     Her Past Surgical History Is Significant For: Past Surgical History:  Procedure Laterality Date  . BILE DUCT EXPLORATION     gallstone removed  . BREAST ENHANCEMENT SURGERY Bilateral   . BUNIONECTOMY Left 12/2013  . CHOLECYSTECTOMY    . COLONOSCOPY    . FOOT SURGERY     right  . 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 MOUTH SURGERY    . SHOULDER SURGERY     left  . TOTAL ABDOMINAL HYSTERECTOMY     pelvic pain/scar tissue; ovaries intact.  . TUBAL LIGATION      Her Family History Is Significant For: Family History  Problem Relation Age of Onset  . Prostate cancer Father   . Lung disease Father   . Diabetes Father   . Heart disease Father 49       CHF, AMI multiple/CABG  . COPD Father   . Heart attack Father   . Cancer Father        testicular cancer  . Diabetes Mother   . Hypertension Mother   . Hyperlipidemia Mother   . COPD Mother   . Cancer Mother        lung cancer  . Diabetes Brother   . Hypertension Brother   . Obesity Brother   . Hyperlipidemia Brother   . COPD Brother   . Stroke Maternal Grandmother   . Heart disease Maternal Grandfather   . COPD Paternal Grandmother   . Diabetes Paternal Grandmother   . COPD Paternal Grandfather   . Heart attack Paternal Grandfather   . Colon cancer Cousin        died age 57  . Breast cancer Maternal Aunt   . Colon cancer Cousin   . Colon polyps Neg Hx   . Esophageal cancer Neg Hx   . Rectal cancer Neg Hx   . Stomach cancer Neg Hx     Her Social History Is Significant For: Social History   Socioeconomic History  . Marital status: Married    Spouse name: Richardson Landry  . Number of children: 3  . Years of education: 10th grade  . Highest education level: None  Social Needs  . Financial resource strain: None  . Food insecurity - worry: None  . Food insecurity - inability: None  . Transportation needs - medical: None  . Transportation needs - non-medical: None  Occupational History    Employer: [employer]  Tobacco Use  . Smoking status: Never Smoker  . Smokeless tobacco: Never Used  Substance and Sexual Activity  . Alcohol use: Yes  Alcohol/week: 0.0 oz    Comment: occasional   . Drug use: No  . Sexual activity: Yes    Birth control/protection: Post-menopausal, Surgical  Other Topics Concern  . None  Social History Narrative   Marital status:  Married x 22 years; happily married,no abuse. 2nd marriage.      Children: 3 sons (42, 72, 38)---2 sons with substance abuse; 3 grandchildren; 3 step grandchildren; 2 gg.      Lives: with husband, son/Brian.      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      Drugs:none      Exercise: no exercise in 2018      Seatbelt: 100%; no texting      Guns: none      Always uses seat belts. Smoke alarm and carbon monoxide detector in the home.No caffeine use. No unsecured guns in the home.     Her Allergies Are:  Allergies  Allergen Reactions  . Bactrim [Sulfamethoxazole-Trimethoprim] Other (See Comments)    blisters  . Keflex [Cephalexin] Hives  . Macrobid [Nitrofurantoin Macrocrystal] Other (See Comments)    blisters  . Nitrofurantoin Nausea And Vomiting and Other (See Comments)    Blisters   . Sulfa Drugs Cross Reactors Itching  . Adhesive [Tape] Other (See Comments)    Tears skins  . Codeine Itching  . Talwin [Pentazocine] Nausea And Vomiting and Rash    Muscle cramps and vision changes  :   Her Current Medications Are:  Outpatient Encounter Medications as of 09/12/2017  Medication Sig  . aspirin 81 MG tablet Take 81 mg by mouth every evening.  . Calcium Carb-Cholecalciferol (CALCIUM 500 +D) 500-400 MG-UNIT TABS Take 1 tablet by mouth daily.   . colestipol (COLESTID) 1 g tablet Take 1 tablet (1 g total) 2 (two) times daily by mouth.  . dapagliflozin propanediol (FARXIGA) 10 MG TABS tablet Take 10 mg by mouth daily.  [name] DEXILANT 60 MG capsule TAKE ONE CAPSULE BY MOUTH DAILY  . Dulaglutide (TRULICITY) 5.95 GL/8.7FI SOPN Inject 0.75 mg into the skin once a week.  . fluticasone  (FLONASE) 50 MCG/ACT nasal spray Place 2 sprays into both nostrils daily.  [name] gabapentin (NEURONTIN) 300 MG capsule TAKE ONE CAPSULE BY MOUTH THREE TIMES A DAY  . hydrochlorothiazide (HYDRODIURIL) 25 MG tablet TAKE ONE TABLET BY MOUTH DAILY  . JANUVIA 100 MG tablet TAKE ONE TABLET BY MOUTH DAILY  . losartan (COZAAR) 50 MG tablet TAKE ONE TABLET BY MOUTH DAILY  . metaxalone (SKELAXIN) 800 MG tablet Take 800 mg by mouth 3 (three) times daily.  . metFORMIN (GLUCOPHAGE) 500 MG tablet Take 1 tablet (500 mg total) by mouth 2 (two) times daily with a meal.  . Misc Natural Products (DANDELION ROOT) 520 MG CAPS Take by mouth 2 (two) times daily after a meal.  . omega-3 acid ethyl esters (LOVAZA) 1 g capsule TAKE ONE CAPSULE BY MOUTH TWICE A DAY  . Potassium Chloride ER 20 MEQ TBCR TAKE TWO TABLETS BY MOUTH DAILY  . pramipexole (MIRAPEX) 0.125 MG tablet Take 2 tablets (0.25 mg total) by mouth at bedtime.  . simvastatin (ZOCOR) 40 MG tablet TAKE ONE TABLET BY MOUTH EVERY NIGHT AT BEDTIME  . venlafaxine XR (EFFEXOR-XR) 75 MG 24 hr capsule TAKE ONE CAPSULE BY MOUTH EVERY EVENING  . VOLTAREN 1 % GEL Apply 2 g topically 3 (three)  times daily as needed (pain).   . [DISCONTINUED] PREVALITE 4 g packet TAKE 1 PACKET (4G TOTAL) BY MOUTH TWO TIMES A DAY   No facility-administered encounter medications on file as of 09/12/2017.   :  Review of Systems:  Out of a complete 14 point review of systems, all are reviewed and negative with the exception of these symptoms as listed below: Review of Systems  Neurological:       PT presents today to discuss her RLS. Pt thinks her RLS is happening more frequently. She is taking mirapex as prescribed.    Objective:  Neurological Exam  Physical Exam Physical Examination:   Vitals:   09/12/17 1513  BP: 120/80  Pulse: 77   General Examination: The patient is a very pleasant 62 y.o. female in no acute distress. She appears well-developed and well-nourished and well  groomed.   HEENT: Normocephalic, atraumatic, pupils are equal, round and reactive to light and accommodation. Extraocular tracking is good without limitation to gaze excursion or nystagmus noted. Normal smooth pursuit is noted. Hearing is grossly intact. Face is symmetric with normal facial animation and normal facial sensation. Speech is clear with no dysarthria noted. There is no hypophonia. There is no lip, neck/head, jaw or voice tremor. Neck is supple with full range of passive and active motion. There are no carotid bruits on auscultation. Oropharynx exam reveals: moderate mouth dryness, adequate dental hygiene and moderate airway crowding. Mallampati is class II. Tongue protrudes centrally and palate elevates symmetrically. Tonsils are 1+ in size.   Chest: Clear to auscultation without wheezing, rhonchi or crackles noted.  Heart: S1+S2+0, regular and normal without murmurs, rubs or gallops noted.   Abdomen: Soft, non-tender and non-distended with normal bowel sounds appreciated on auscultation.  Extremities: There is no edema.   Skin: Dry without trophic changes noted. There are no varicose veins.   Musculoskeletal: exam reveals no obvious joint deformities, tenderness or joint swelling or erythema, arthritic changes noticed in both hands, L>R.   Neurologically:  Mental status: The patient is awake, alert and oriented in all 4 spheres. Her immediate and remote memory, attention, language skills and fund of knowledge are appropriate. There is no evidence of aphasia, agnosia, apraxia or anomia. Speech is clear with normal prosody and enunciation. Thought process is linear. Mood is normal and affect is normal.  Cranial nerves II - XII are as described above under HEENT exam. In addition: shoulder shrug is normal with equal shoulder height noted. Motor exam: Normal bulk, strength and tone is noted. There is no drift, tremor or rebound. Romberg is negative, except for mild sway. Reflexes  are 2+ throughout. Fine motor skills and coordination: intact grossly.  Cerebellar testing: No dysmetria or intention tremor. There is no truncal or gait ataxia.  Sensory exam: intact to light touch in the upper and lower extremities.  Gait, station and balance: She stands easily. No veering to one side is noted. No leaning to one side is noted. Posture is age-appropriate and stance is narrow based. Gait shows normal stride length and normal pace. No problems turning are noted. Tandem walk is slightly challenging in the beginning.            Assessment and Plan:   In summary, Dorothy [name] is a very pleasant 62 year old female with an underlying complex medical history of diabetes, hyperlipidemia, depression, fatty liver, reflux disease, anxiety, gallstones, migraine headaches, osteoarthritis, peptic ulcer disease, vitamin B12 deficiency, Mnire's disease and benign positional vertigo, allergic  rhinitis, hiatal hernia and fibromyalgia, who presents for followup consultation of her restless leg syndrome and PLMs. In May 2015, she started Neupro and titrated this up to 2 mg once daily with improvement in her RLS symptoms and better sleep but she developed itching and burning at the site of the patches and had to stop it in 4/16. She then started low-dose Mirapex starting at 0.125 mg each night with gradual titration. She has been on 2 pills each night with good results for the past couple of years. She feels that in the past 6 months or so her restless leg symptoms have increased. She had no major changes in her antidepressant medication. She has had some more stressed, more issues with arthritis, suboptimal diabetes control in the recent past. She is starting new treatment for her diabetes. Otherwise, her exam and weight have been stable. She has been able to tolerate Mirapex low-dose. I suggested we increase this to 0.375 mg each night. She is agreeable. I suggested a one-year checkup, sooner as needed.  I answered all her questions today and she was in agreement, I renewed her prescription for Mirapex generic for 90 day prescription with refills.  I spent 20 minutes in total face-to-face time with the patient, more than 50% of which was spent in counseling and coordination of care, reviewing test results, reviewing medication and discussing or reviewing the diagnosis of RLS and PLMD, the prognosis and treatment options. Pertinent laboratory and imaging test results that were available during this visit with the patient were reviewed by me and considered in my medical decision making (see chart for details).

## 2017-09-13 ENCOUNTER — Other Ambulatory Visit: Payer: Self-pay | Admitting: Neurology

## 2017-09-13 DIAGNOSIS — G2581 Restless legs syndrome: Secondary | ICD-10-CM

## 2017-09-13 NOTE — Telephone Encounter (Signed)
Pt informed of labs

## 2017-09-21 ENCOUNTER — Other Ambulatory Visit: Payer: Self-pay | Admitting: Rheumatology

## 2017-09-24 NOTE — Telephone Encounter (Signed)
Last Visit: 09/05/17 Next Visit: 03/13/18  Okay to refill per Dr. Deveshwar  

## 2017-09-26 ENCOUNTER — Encounter: Payer: Self-pay | Admitting: Cardiovascular Disease

## 2017-09-26 ENCOUNTER — Ambulatory Visit: Payer: 59 | Admitting: Cardiovascular Disease

## 2017-09-26 DIAGNOSIS — I5032 Chronic diastolic (congestive) heart failure: Secondary | ICD-10-CM | POA: Diagnosis not present

## 2017-09-26 DIAGNOSIS — E7800 Pure hypercholesterolemia, unspecified: Secondary | ICD-10-CM

## 2017-09-26 NOTE — Assessment & Plan Note (Signed)
History of diastolic heart failure on Hytrin viral. Patient really denies chest pain or shortness of breath.

## 2017-09-26 NOTE — Progress Notes (Signed)
09/26/2017 Dorothy [name]   [birth year]  [id number]  Primary Physician [name], MD Primary Cardiologist: Lorretta Harp MD FACP, [place], [place], [place]  HPI:  Dorothy [name] is a 62 y.o. mild to moderately overweight married Caucasian female mother of 64, grandmother and 5 grandchildren referred by Dr. Sonia Baller for cardiovascular evaluation because of recent episode of chest pain and shortness of breath. 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 She has seen Drs. Cooper, [place], and Dr. Acie Fredrickson in the past. Risk factors include type 2 diabetes and treated hyperlipidemia. Her father did have CABG. She's never had a heart attack or stroke. She's had a normal 2-D echo and Myoview stress test back in 2015 and saw Angelena Form in the office last 04/11/16. She had one episode of chest pain lasting for 3 hours with shortness of breath probably 5 weeks ago while bending over at work. She's had no recurrent symptoms.   Current Meds  Medication Sig  . aspirin 81 MG tablet Take 81 mg by mouth every evening.  . Calcium Carb-Cholecalciferol (CALCIUM 500 +D) 500-400 MG-UNIT TABS Take 1 tablet by mouth daily.   . colestipol (COLESTID) 1 g tablet Take 1 tablet (1 g total) 2 (two) times daily by mouth.  . DEXILANT 60 MG capsule TAKE ONE CAPSULE BY MOUTH DAILY  . Dulaglutide (TRULICITY) 1.60 VP/7.1GG SOPN Inject 0.75 mg into the skin once a week.  . fluticasone (FLONASE) 50 MCG/ACT nasal spray Place 2 sprays into both nostrils daily.  [name] gabapentin (NEURONTIN) 300 MG capsule TAKE ONE CAPSULE BY MOUTH THREE TIMES A DAY  . hydrochlorothiazide (HYDRODIURIL) 25 MG tablet TAKE ONE TABLET BY MOUTH DAILY  . losartan (COZAAR) 50 MG tablet TAKE ONE TABLET BY MOUTH DAILY  . metaxalone (SKELAXIN) 800 MG tablet Take 800 mg by mouth 3 (three) times daily.  . metFORMIN (GLUCOPHAGE) 500 MG tablet Take 1 tablet (500 mg total) by mouth 2 (two) times daily with a meal.  . Misc Natural Products (DANDELION  ROOT) 520 MG CAPS Take by mouth 2 (two) times daily after a meal.  . omega-3 acid ethyl esters (LOVAZA) 1 g capsule TAKE ONE CAPSULE BY MOUTH TWICE A DAY  . Potassium Chloride ER 20 MEQ TBCR TAKE TWO TABLETS BY MOUTH DAILY  . pramipexole (MIRAPEX) 0.125 MG tablet Take 3 tablets (0.375 mg total) by mouth at bedtime.  . simvastatin (ZOCOR) 40 MG tablet TAKE ONE TABLET BY MOUTH EVERY NIGHT AT BEDTIME  . VOLTAREN 1 % GEL Apply 2 g topically 3 (three) times daily as needed (pain).      Allergies  Allergen Reactions  . Bactrim [Sulfamethoxazole-Trimethoprim] Other (See Comments)    blisters  . Keflex [Cephalexin] Hives  . Macrobid [Nitrofurantoin Macrocrystal] Other (See Comments)    blisters  . Nitrofurantoin Nausea And Vomiting and Other (See Comments)    Blisters   . Sulfa Drugs Cross Reactors Itching  . Adhesive [Tape] Other (See Comments)    Tears skins  . Codeine Itching  . Talwin [Pentazocine] Nausea And Vomiting and Rash    Muscle cramps and vision changes    Social History   Socioeconomic History  . Marital status: Married    Spouse name: Richardson Landry  . Number of children: 3  . Years of education: 10th grade  . Highest education level: Not on file  Social Needs  . Financial resource strain: Not on file  . Food insecurity - worry: Not on  file  . Food insecurity - inability: Not on file  . Transportation needs - medical: Not on file  . Transportation needs - non-medical: Not on file  Occupational History    Employer: [employer]  Tobacco Use  . Smoking status: Never Smoker  . Smokeless tobacco: Never Used  Substance and Sexual Activity  . Alcohol use: Yes    Alcohol/week: 0.0 oz    Comment: occasional   . Drug use: No  . Sexual activity: Yes    Birth control/protection: Post-menopausal, Surgical  Other Topics Concern  . Not on file  Social History Narrative   Marital status:  Married x 22 years; happily married,no abuse. 2nd marriage.       Children: 3 sons (42, 54, 38)---2 sons with substance abuse; 3 grandchildren; 3 step grandchildren; 2 gg.      Lives: with husband, son/Brian.      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      Drugs:none      Exercise: no exercise in 2018      Seatbelt: 100%; no texting      Guns: none      Always uses seat belts. Smoke alarm and carbon monoxide detector in the home.No caffeine use. No unsecured guns in the home.      Review of Systems: General: negative for chills, fever, night sweats or weight changes.  Cardiovascular: negative for chest pain, dyspnea on exertion, edema, orthopnea, palpitations, paroxysmal nocturnal dyspnea or shortness of breath Dermatological: negative for rash Respiratory: negative for cough or wheezing Urologic: negative for hematuria Abdominal: negative for nausea, vomiting, diarrhea, bright red blood per rectum, melena, or hematemesis Neurologic: negative for visual changes, syncope, or dizziness All other systems reviewed and are otherwise negative except as noted above.    Blood pressure 124/68, pulse 75, height 5\' 5"  (1.651 m), weight 169 lb (76.7 kg).  General appearance: alert and no distress Neck: no adenopathy, no carotid bruit, no JVD, supple, symmetrical, trachea midline and thyroid not enlarged, symmetric, no tenderness/mass/nodules Lungs: clear to auscultation bilaterally Heart: regular rate and rhythm, S1, S2 normal, no murmur, click, rub or gallop Extremities: extremities normal, atraumatic, no cyanosis or edema Pulses: 2+ and symmetric Skin: Skin color, texture, turgor normal. No rashes or lesions Neurologic: Alert and oriented X 3, normal strength and tone. Normal symmetric reflexes. Normal coordination and gait  EKG not performed today  ASSESSMENT AND PLAN:   HLD (hyperlipidemia) History of hyperlipidemia on statin therapy followed by her PCP. Her  most recent lipid profile performed 09/05/17 revealed an LDL of 84 and HDL of 65.  Chronic diastolic heart failure (HCC) History of diastolic heart failure on Hytrin viral. Patient really denies chest pain or shortness of breath.      Lorretta Harp MD FACP,FACC,FAHA, [facility] 09/26/2017 10:22 AM

## 2017-09-26 NOTE — Patient Instructions (Signed)
Medication Instructions: Your physician recommends that you continue on your current medications as directed. Please refer to the Current Medication list given to you today.   Follow-Up: Your physician recommends that you schedule a follow-up appointment as needed with Dr. Berry.    

## 2017-09-26 NOTE — Assessment & Plan Note (Signed)
History of hyperlipidemia on statin therapy followed by her PCP. Her most recent lipid profile performed 09/05/17 revealed an LDL of 84 and HDL of 65.

## 2017-10-03 ENCOUNTER — Ambulatory Visit
Admission: RE | Admit: 2017-10-03 | Discharge: 2017-10-03 | Disposition: A | Discharge status: Home / Self Care | Payer: 59 | Source: Ambulatory Visit | Attending: Family Medicine | Admitting: Family Medicine

## 2017-10-03 DIAGNOSIS — Z1231 Encounter for screening mammogram for malignant neoplasm of breast: Secondary | ICD-10-CM

## 2017-10-13 ENCOUNTER — Other Ambulatory Visit: Payer: Self-pay | Admitting: Family Medicine

## 2017-10-25 ENCOUNTER — Other Ambulatory Visit: Payer: Self-pay | Admitting: Family Medicine

## 2017-11-09 ENCOUNTER — Other Ambulatory Visit: Payer: Self-pay | Admitting: Gastroenterology

## 2017-11-09 ENCOUNTER — Other Ambulatory Visit: Payer: Self-pay | Admitting: Family Medicine

## 2017-11-23 ENCOUNTER — Other Ambulatory Visit: Payer: Self-pay | Admitting: Family Medicine

## 2017-12-04 ENCOUNTER — Ambulatory Visit: Payer: 59

## 2017-12-05 ENCOUNTER — Encounter: Payer: Self-pay | Admitting: Family Medicine

## 2017-12-05 ENCOUNTER — Other Ambulatory Visit: Payer: Self-pay

## 2017-12-05 ENCOUNTER — Telehealth: Payer: Self-pay

## 2017-12-05 ENCOUNTER — Ambulatory Visit: Payer: 59 | Admitting: Family Medicine

## 2017-12-05 VITALS — BP 120/70 | HR 92 | Temp 99.0°F | Ht 66.14 in | Wt 166.6 lb

## 2017-12-05 DIAGNOSIS — K580 Irritable bowel syndrome with diarrhea: Secondary | ICD-10-CM

## 2017-12-05 DIAGNOSIS — M797 Fibromyalgia: Secondary | ICD-10-CM | POA: Diagnosis not present

## 2017-12-05 DIAGNOSIS — K219 Gastro-esophageal reflux disease without esophagitis: Secondary | ICD-10-CM

## 2017-12-05 DIAGNOSIS — F411 Generalized anxiety disorder: Secondary | ICD-10-CM

## 2017-12-05 DIAGNOSIS — E1121 Type 2 diabetes mellitus with diabetic nephropathy: Secondary | ICD-10-CM

## 2017-12-05 DIAGNOSIS — R1011 Right upper quadrant pain: Secondary | ICD-10-CM | POA: Diagnosis not present

## 2017-12-05 DIAGNOSIS — E7800 Pure hypercholesterolemia, unspecified: Secondary | ICD-10-CM

## 2017-12-05 LAB — POCT GLYCOSYLATED HEMOGLOBIN (HGB A1C): Hemoglobin A1C: 7.3

## 2017-12-05 LAB — LIPID PANEL
CHOL/HDL RATIO: 2.5 ratio (ref 0.0–4.4)
Cholesterol, Total: 165 mg/dL (ref 100–199)
HDL: 66 mg/dL (ref >39)
LDL CALC: 72 mg/dL (ref 0–99)
Triglycerides: 134 mg/dL (ref 0–149)
VLDL Cholesterol Cal: 27 mg/dL (ref 5–40)

## 2017-12-05 LAB — CBC WITH DIFFERENTIAL/PLATELET
BASOS: 0 %
Basophils Absolute: 0 10*3/uL (ref 0.0–0.2)
EOS (ABSOLUTE): 0 10*3/uL (ref 0.0–0.4)
EOS: 0 %
HEMOGLOBIN: 14.8 g/dL (ref 11.1–15.9)
Hematocrit: 45.7 % (ref 34.0–46.6)
IMMATURE GRANULOCYTES: 0 %
Immature Grans (Abs): 0 10*3/uL (ref 0.0–0.1)
Lymphocytes Absolute: 1.5 10*3/uL (ref 0.7–3.1)
Lymphs: 31 %
MCH: 30.8 pg (ref 26.6–33.0)
MCHC: 32.4 g/dL (ref 31.5–35.7)
MCV: 95 fL (ref 79–97)
MONOCYTES: 7 %
MONOS ABS: 0.3 10*3/uL (ref 0.1–0.9)
NEUTROS PCT: 62 %
Neutrophils Absolute: 2.9 10*3/uL (ref 1.4–7.0)
Platelets: 252 10*3/uL (ref 150–379)
RBC: 4.81 x10E6/uL (ref 3.77–5.28)
RDW: 14 % (ref 12.3–15.4)
WBC: 4.8 10*3/uL (ref 3.4–10.8)

## 2017-12-05 LAB — COMPREHENSIVE METABOLIC PANEL
A/G RATIO: 2.6 — AB (ref 1.2–2.2)
ALBUMIN: 4.6 g/dL (ref 3.6–4.8)
ALT: 37 IU/L — ABNORMAL HIGH (ref 0–32)
AST: 22 IU/L (ref 0–40)
Alkaline Phosphatase: 80 IU/L (ref 39–117)
BUN / CREAT RATIO: 22 (ref 12–28)
BUN: 20 mg/dL (ref 8–27)
Bilirubin Total: 0.6 mg/dL (ref 0.0–1.2)
CALCIUM: 9.5 mg/dL (ref 8.7–10.3)
CO2: 24 mmol/L (ref 20–29)
CREATININE: 0.89 mg/dL (ref 0.57–1.00)
Chloride: 98 mmol/L (ref 96–106)
GFR, EST AFRICAN AMERICAN: 81 mL/min/{1.73_m2} (ref >59)
GFR, EST NON AFRICAN AMERICAN: 70 mL/min/{1.73_m2} (ref >59)
GLOBULIN, TOTAL: 1.8 g/dL (ref 1.5–4.5)
Glucose: 165 mg/dL — ABNORMAL HIGH (ref 65–99)
POTASSIUM: 3.6 mmol/L (ref 3.5–5.2)
SODIUM: 139 mmol/L (ref 134–144)
TOTAL PROTEIN: 6.4 g/dL (ref 6.0–8.5)

## 2017-12-05 LAB — GLUCOSE, POCT (MANUAL RESULT ENTRY): POC Glucose: 139 mg/dl — AB (ref 70–99)

## 2017-12-05 NOTE — Patient Instructions (Addendum)
   IF you received an x-ray today, you will receive an invoice from [facility]. Please contact [facility] at [phone] with questions or concerns regarding your invoice.   IF you received labwork today, you will receive an invoice from [facility]. Please contact [facility] at [phone] with questions or concerns regarding your invoice.   Our billing staff will not be able to assist you with questions regarding bills from these companies.  You will be contacted with the lab results as soon as they are available. The fastest way to get your results is to activate your My Chart account. Instructions are located on the last page of this paperwork. If you have not heard from us regarding the results in 2 weeks, please contact this office.     Diabetes Mellitus and Nutrition When you have diabetes (diabetes mellitus), it is very important to have healthy eating habits because your blood sugar (glucose) levels are greatly affected by what you eat and drink. Eating healthy foods in the appropriate amounts, at about the same times every day, can help you:  Control your blood glucose.  Lower your risk of heart disease.  Improve your blood pressure.  Reach or maintain a healthy weight.  Every person with diabetes is different, and each person has different needs for a meal plan. Your health care provider may recommend that you work with a diet and nutrition specialist (dietitian) to make a meal plan that is best for you. Your meal plan may vary depending on factors such as:  The calories you need.  The medicines you take.  Your weight.  Your blood glucose, blood pressure, and cholesterol levels.  Your activity level.  Other health conditions you have, such as heart or kidney disease.  How do carbohydrates affect me? Carbohydrates affect your blood glucose level more than any other type of food. Eating carbohydrates naturally increases the amount of glucose in your  blood. Carbohydrate counting is a method for keeping track of how many carbohydrates you eat. Counting carbohydrates is important to keep your blood glucose at a healthy level, especially if you use insulin or take certain oral diabetes medicines. It is important to know how many carbohydrates you can safely have in each meal. This is different for every person. Your dietitian can help you calculate how many carbohydrates you should have at each meal and for snack. Foods that contain carbohydrates include:  Bread, cereal, rice, pasta, and crackers.  Potatoes and corn.  Peas, beans, and lentils.  Milk and yogurt.  Fruit and juice.  Desserts, such as cakes, cookies, ice cream, and candy.  How does alcohol affect me? Alcohol can cause a sudden decrease in blood glucose (hypoglycemia), especially if you use insulin or take certain oral diabetes medicines. Hypoglycemia can be a life-threatening condition. Symptoms of hypoglycemia (sleepiness, dizziness, and confusion) are similar to symptoms of having too much alcohol. If your health care provider says that alcohol is safe for you, follow these guidelines:  Limit alcohol intake to no more than 1 drink per day for nonpregnant women and 2 drinks per day for men. One drink equals 12 oz of beer, 5 oz of wine, or 1 oz of hard liquor.  Do not drink on an empty stomach.  Keep yourself hydrated with water, diet soda, or unsweetened iced tea.  Keep in mind that regular soda, juice, and other mixers may contain a lot of sugar and must be counted as carbohydrates.  What are tips for following   this plan? Reading food labels  Start by checking the serving size on the label. The amount of calories, carbohydrates, fats, and other nutrients listed on the label are based on one serving of the food. Many foods contain more than one serving per package.  Check the total grams (g) of carbohydrates in one serving. You can calculate the number of servings of  carbohydrates in one serving by dividing the total carbohydrates by 15. For example, if a food has 30 g of total carbohydrates, it would be equal to 2 servings of carbohydrates.  Check the number of grams (g) of saturated and trans fats in one serving. Choose foods that have low or no amount of these fats.  Check the number of milligrams (mg) of sodium in one serving. Most people should limit total sodium intake to less than 2,300 mg per day.  Always check the nutrition information of foods labeled as "low-fat" or "nonfat". These foods may be higher in added sugar or refined carbohydrates and should be avoided.  Talk to your dietitian to identify your daily goals for nutrients listed on the label. Shopping  Avoid buying canned, premade, or processed foods. These foods tend to be high in fat, sodium, and added sugar.  Shop around the outside edge of the grocery store. This includes fresh fruits and vegetables, bulk grains, fresh meats, and fresh dairy. Cooking  Use low-heat cooking methods, such as baking, instead of high-heat cooking methods like deep frying.  Cook using healthy oils, such as olive, canola, or sunflower oil.  Avoid cooking with butter, cream, or high-fat meats. Meal planning  Eat meals and snacks regularly, preferably at the same times every day. Avoid going long periods of time without eating.  Eat foods high in fiber, such as fresh fruits, vegetables, beans, and whole grains. Talk to your dietitian about how many servings of carbohydrates you can eat at each meal.  Eat 4-6 ounces of lean protein each day, such as lean meat, chicken, fish, eggs, or tofu. 1 ounce is equal to 1 ounce of meat, chicken, or fish, 1 egg, or 1/4 cup of tofu.  Eat some foods each day that contain healthy fats, such as avocado, nuts, seeds, and fish. Lifestyle   Check your blood glucose regularly.  Exercise at least 30 minutes 5 or more days each week, or as told by your health care  provider.  Take medicines as told by your health care provider.  Do not use any products that contain nicotine or tobacco, such as cigarettes and e-cigarettes. If you need help quitting, ask your health care provider.  Work with a counselor or diabetes educator to identify strategies to manage stress and any emotional and social challenges. What are some questions to ask my health care provider?  Do I need to meet with a diabetes educator?  Do I need to meet with a dietitian?  What number can I call if I have questions?  When are the best times to check my blood glucose? Where to find more information:  American Diabetes Association: diabetes.org/food-and-fitness/food  Academy of Nutrition and Dietetics: www.eatright.org/resources/health/diseases-and-conditions/diabetes  National Institute of Diabetes and Digestive and Kidney Diseases (NIH): www.niddk.nih.gov/health-information/diabetes/overview/diet-eating-physical-activity Summary  A healthy meal plan will help you control your blood glucose and maintain a healthy lifestyle.  Working with a diet and nutrition specialist (dietitian) can help you make a meal plan that is best for you.  Keep in mind that carbohydrates and alcohol have immediate effects on your blood glucose   glucose levels. It is important to count carbohydrates and to use alcohol carefully. This information is not intended to replace advice given to you by your health care provider. Make sure you discuss any questions you have with your health care provider. Document Released: 05/11/2005 Document Revised: 09/18/2016 Document Reviewed: 09/18/2016 Elsevier Interactive Patient Education  [employer].

## 2017-12-05 NOTE — Telephone Encounter (Signed)
I have attempted to call patient and inform her that we have attempted to call her to find out which diabetes test strips she needed for she states that she is completely out. Dr. Tamala Julian is in agreement in sending the test strip to the pharmacy.  Called patient no answer. Left a message to call back.   Thanks, Lucillie Garfinkel, CMA  Will attempt to message patient via patient portal e-mail.

## 2017-12-05 NOTE — Progress Notes (Signed)
Subjective:    Patient ID: Dorothy [name], female    DOB: Dec 22, 1955, 62 y.o.   MRN: [id number]  12/05/2017  Diabetes (3 month F/U)    HPI This 62 y.o. female presents for three month follow-up evaluation of DMII, hypercholesterolemia, anxiety/depression, fibromyalgia.  Management changes made at last visit include the following:  Diabetes mellitus stable.  Diarrhea has improved with decreased metformin dose as well as adjustments per gastroenterology for IBS.  Tolerating far [place] without side effects.  Obtain labs for chronic disease management. New onset chest pain with shortness of breath and diaphoresis 1 month ago.  Some atypical features however some concerning cardiac features to chest pain.  Refer to cardiology.  Obtain EKG today.  Obtain labs.  Advised to present to emergency department for recurrence of chest pain. Hypercholesterolemia well controlled at this time.  Obtain labs.  Continue current medications. Thyroid function slightly abnormal at last visit.  We will repeat today. Fibromyalgia stable.  Managed by rheumatology  No evidence of anemia. Sugar/glucose remains elevated at 193. Goal sugar in a fasting state is less than 120. Hemoglobin A1c has actually worsened to 8.6 since last visit. I recommend adding Trulicity which is a once weekly injection. You can stop Januvia when you start Trulicity. I also recommend increasing Metformin to three tablets daily.  Kidney function is normal. ALT represents liver function. This level is slightly elevated at 38. This level is actually improved from last visit. We will continue to monitor at each visit. Cholesterol is under good control other than elevated triglyceride level. I recommend a low-fat diet with exercise and weight loss. Thyroid function or TSH is now normal.  UPDATE: Down five pounds since last visit; started Trulicity.   Since starting Trulicity having RUQ pain; taking Trulicity every Thursday.  Has  severe nausea; never vomits; occurs a few minutes.  Eats something.  Taking Metformin 500mg  1 twice daily. Not exercising.  Husband and pt changing the way eats.  Keeping a strict check on patient. S/p cardiology consultation on 09/26/17; [place]; no recurrent chest pain.  Working part-time; needed several more receptionist at work; gave hours to [employer].  Emotionally stable.    BP Readings from Last 3 Encounters:  12/21/17 122/74  12/05/17 120/70  09/26/17 124/68   Wt Readings from Last 3 Encounters:  12/21/17 167 lb 6.4 oz (75.9 kg)  12/05/17 166 lb 9.6 oz (75.6 kg)  09/26/17 169 lb (76.7 kg)   Immunization History  Administered Date(s) Administered  . Hepatitis B 12/14/2011, 06/11/2012, 01/07/2013  . Influenza Split 06/11/2012  . Influenza,inj,Quad PF,6+ Mos 07/28/2013, 07/15/2014, 06/14/2015, 07/26/2016, 05/09/2017  . Pneumococcal Polysaccharide-23 06/14/2015  . Pneumococcal-Unspecified 08/29/2007  . Tdap 08/28/2008    Review of Systems  Constitutional: Negative for chills, diaphoresis, fatigue and fever.  HENT: Negative for congestion.   Eyes: Negative for visual disturbance.  Respiratory: Negative for cough and shortness of breath.   Cardiovascular: Negative for chest pain, palpitations and leg swelling.  Gastrointestinal: Positive for nausea. Negative for abdominal pain, constipation, diarrhea and vomiting.  Endocrine: Negative for cold intolerance, heat intolerance, polydipsia, polyphagia and polyuria.  Musculoskeletal: Positive for arthralgias, myalgias, neck pain and neck stiffness.  Neurological: Negative for dizziness, tremors, seizures, syncope, facial asymmetry, speech difficulty, weakness, light-headedness, numbness and headaches.  Psychiatric/Behavioral: Negative for dysphoric mood, self-injury, sleep disturbance and suicidal ideas. The patient is not nervous/anxious.     Past Medical History:  Diagnosis Date  . Allergic rhinitis, cause unspecified   .  Allergy   . Anxiety   . B12 deficiency   . Benign paroxysmal positional vertigo   . Choledocholithiasis   . Congestive heart failure ([place])   . Depression   . Diabetes mellitus   . Fatty liver   . Fatty liver 09/04/12  . Fibromyalgia   . Gastritis   . GERD (gastroesophageal reflux disease)   . Hematuria, unspecified   . Hiatal hernia   . Hx of adenomatous colonic polyps   . Hx of cardiovascular stress test    ETT-Myoview (12/15):  No ischemia.  No ECG changes.  EF 73%.  Low Risk.   [name] Hx of echocardiogram    Echo (12/15):  EF 60-65%, no RWMA, normal diast function, mild RVE  . Hyperlipidemia   . Meniere's disease, unspecified   . Migraine   . Mononeuritis of unspecified site   . Osteoarthritis   . Osteoporosis, unspecified   . Otosclerosis, unspecified   . Peptic ulcer, unspecified site, unspecified as acute or chronic, without mention of hemorrhage, perforation, or obstruction   . Symptomatic menopausal or female climacteric states   . Unspecified disorder of kidney and ureter    Past Surgical History:  Procedure Laterality Date  . BILE DUCT EXPLORATION     gallstone removed  . BREAST ENHANCEMENT SURGERY Bilateral   . BUNIONECTOMY Left 12/2013  . CHOLECYSTECTOMY    . COLONOSCOPY    . FOOT SURGERY     right  . 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 MOUTH SURGERY    . SHOULDER SURGERY     left  . TOTAL ABDOMINAL HYSTERECTOMY     pelvic pain/scar tissue; ovaries intact.  . TUBAL LIGATION     Allergies  Allergen Reactions  . Bactrim [Sulfamethoxazole-Trimethoprim] Other (See Comments)    blisters  . Keflex [Cephalexin] Hives  . Macrobid [Nitrofurantoin Macrocrystal] Other (See Comments)    blisters  . Nitrofurantoin Nausea And Vomiting and Other (See Comments)    Blisters   . Sulfa  Drugs Cross Reactors Itching  . Adhesive [Tape] Other (See Comments)    Tears skins  . Codeine Itching  . Talwin [Pentazocine] Nausea And Vomiting and Rash    Muscle cramps and vision changes   Current Outpatient Medications on File Prior to Visit  Medication Sig Dispense Refill  . aspirin 81 MG tablet Take 81 mg by mouth every evening.    . Calcium Carb-Cholecalciferol (CALCIUM 500 +D) 500-400 MG-UNIT TABS Take 1 tablet by mouth daily.     [name] DEXILANT 60 MG capsule TAKE ONE CAPSULE BY MOUTH DAILY 30 capsule 10  . Dulaglutide (TRULICITY) 4.19 FX/9.0WI SOPN Inject 0.75 mg into the skin once a week. 5 pen 5  . fluticasone (FLONASE) 50 MCG/ACT nasal spray Place 2 sprays into both nostrils daily. 16 g 6  . hydrochlorothiazide (HYDRODIURIL) 25 MG tablet TAKE ONE TABLET BY MOUTH DAILY 30 tablet 0  . losartan (COZAAR) 50 MG tablet TAKE ONE TABLET BY MOUTH DAILY 30 tablet 4  . metaxalone (SKELAXIN) 800 MG tablet Take 800 mg by mouth 3 (three) times daily.    . metFORMIN (GLUCOPHAGE) 500 MG tablet Take 1 tablet (500 mg total) by mouth 2 (two) times daily with a meal. 180 tablet 3  . Misc Natural Products (DANDELION ROOT) 520 MG CAPS Take by mouth  2 (two) times daily after a meal.    . omega-3 acid ethyl esters (LOVAZA) 1 g capsule TAKE ONE CAPSULE BY MOUTH TWICE A DAY 60 capsule 8  . Potassium Chloride ER 20 MEQ TBCR TAKE TWO TABLETS BY MOUTH DAILY 60 tablet 9  . pramipexole (MIRAPEX) 0.125 MG tablet Take 3 tablets (0.375 mg total) by mouth at bedtime. 270 tablet 3  . simvastatin (ZOCOR) 40 MG tablet TAKE ONE TABLET BY MOUTH EVERY NIGHT AT BEDTIME 30 tablet 5  . VOLTAREN 1 % GEL Apply 2 g topically 3 (three) times daily as needed (pain).      No current facility-administered medications on file prior to visit.    Social History   Socioeconomic History  . Marital status: Married    Spouse name: Richardson Landry  . Number of children: 3  . Years of education: 10th grade  . Highest education level: Not on  file  Occupational History    Employer: [employer]  . Financial resource strain: Not on file  . Food insecurity:    Worry: Not on file    Inability: Not on file  . Transportation needs:    Medical: Not on file    Non-medical: Not on file  Tobacco Use  . Smoking status: Never Smoker  . Smokeless tobacco: Never Used  Substance and Sexual Activity  . Alcohol use: Yes    Alcohol/week: 0.0 oz    Comment: occasional   . Drug use: No  . Sexual activity: Yes    Birth control/protection: Post-menopausal, Surgical  Lifestyle  . Physical activity:    Days per week: Not on file    Minutes per session: Not on file  . Stress: Not on file  Relationships  . Social connections:    Talks on phone: Not on file    Gets together: Not on file    Attends religious service: Not on file    Active member of club or organization: Not on file    Attends meetings of clubs or organizations: Not on file    Relationship status: Not on file  . Intimate partner violence:    Fear of current or ex partner: Not on file    Emotionally abused: Not on file    Physically abused: Not on file    Forced sexual activity: Not on file  Other Topics Concern  . Not on file  Social History Narrative   Marital status:  Married x 22 years; happily married,no abuse. 2nd marriage.      Children: 3 sons (42, 40, 38)---2 sons with substance abuse; 3 grandchildren; 3 step grandchildren; 2 gg.      Lives: with husband, son/Brian.      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      Drugs:none      Exercise: no exercise in 2018      Seatbelt: 100%; no texting      Guns: none      Always uses seat belts. Smoke alarm and carbon monoxide detector in the home.No caffeine use. No unsecured guns in the home.    Family History  Problem Relation Age of Onset  . Prostate cancer Father   . Lung disease Father    . Diabetes Father   . Heart disease Father 31       CHF, AMI  multiple/CABG  . COPD Father   . Heart attack Father   . Cancer Father        testicular cancer  . Diabetes Mother   . Hypertension Mother   . Hyperlipidemia Mother   . COPD Mother   . Cancer Mother        lung cancer  . Diabetes Brother   . Hypertension Brother   . Obesity Brother   . Hyperlipidemia Brother   . COPD Brother   . Stroke Maternal Grandmother   . Heart disease Maternal Grandfather   . COPD Paternal Grandmother   . Diabetes Paternal Grandmother   . COPD Paternal Grandfather   . Heart attack Paternal Grandfather   . Colon cancer Cousin        died age 73  . Breast cancer Maternal Aunt   . Colon cancer Cousin   . Colon polyps Neg Hx   . Esophageal cancer Neg Hx   . Rectal cancer Neg Hx   . Stomach cancer Neg Hx        Objective:    BP 120/70 (BP Location: Left Arm, Patient Position: Sitting, Cuff Size: Normal)   Pulse 92   Temp 99 F (37.2 C) (Oral)   Ht 5' 6.14" (1.68 m)   Wt 166 lb 9.6 oz (75.6 kg)   SpO2 98%   BMI 26.77 kg/m  Physical Exam  Constitutional: She is oriented to person, place, and time. She appears well-developed and well-nourished. No distress.  HENT:  Head: Normocephalic and atraumatic.  Right Ear: External ear normal.  Left Ear: External ear normal.  Nose: Nose normal.  Mouth/Throat: Oropharynx is clear and moist.  Eyes: Pupils are equal, round, and reactive to light. Conjunctivae and EOM are normal.  Neck: Normal range of motion. Neck supple. Carotid bruit is not present. No thyromegaly present.  Cardiovascular: Normal rate, regular rhythm, normal heart sounds and intact distal pulses. Exam reveals no gallop and no friction rub.  No murmur heard. Pulmonary/Chest: Effort normal and breath sounds normal. She has no wheezes. She has no rales.  Abdominal: Soft. Bowel sounds are normal. She exhibits no distension and no mass. There is no tenderness. There is no rebound  and no guarding.  Lymphadenopathy:    She has no cervical adenopathy.  Neurological: She is alert and oriented to person, place, and time. No cranial nerve deficit. She exhibits normal muscle tone. Coordination normal.  Skin: Skin is warm and dry. No rash noted. She is not diaphoretic. No erythema. No pallor.  Psychiatric: She has a normal mood and affect. Her behavior is normal. Judgment and thought content normal.   No results found. Depression screen [facility] 2/9 12/21/2017 12/05/2017 09/05/2017 05/09/2017 02/19/2017  Decreased Interest 0 0 0 0 0  Down, Depressed, Hopeless 0 0 0 0 0  PHQ - 2 Score 0 0 0 0 0   Fall Risk  12/21/2017 12/05/2017 09/05/2017 05/09/2017 02/19/2017  Falls in the past year? No No No Yes Yes  Number falls in past yr: - - - 1 1  Injury with Fall? - - - No No         Assessment & Plan:   1. Type 2 diabetes mellitus with diabetic nephropathy, without long-term current use of insulin ([place])   2. Pure hypercholesterolemia   3. Right upper quadrant abdominal pain   4. Irritable bowel syndrome with diarrhea   5. Gastroesophageal reflux disease without esophagitis   6. Fibromyalgia   7. Anxiety state  New onset epigastric pain: Obtain abdominal ultrasound and labs.  GERD currently well controlled.  Diabetes mellitus type 2: Uncontrolled at last visit.  Tolerating Trulicity moderately well with some transient nausea.  Has appreciated weight loss since initiating Trulicity therapy.  Anxiety: Well-controlled currently.    Status post cardiology consultation for chest pain and dyspnea on exertion since last visit.  No further work-up recommended by Dr. Gwenlyn Found.  No recurrent chest pain since that time.   Orders Placed This Encounter  Procedures  . US Abdomen Complete    Epic order     Standing Status:   Future    Standing Expiration Date:   02/05/2019    Order Specific Question:   Reason for Exam (SYMPTOM  OR DIAGNOSIS REQUIRED)    Answer:   RUQ pain    Order Specific  Question:   Preferred imaging location?    Answer:   [facility]  . CBC with Differential/Platelet  . Comprehensive metabolic panel    Order Specific Question:   Has the patient fasted?    Answer:   No  . Lipid panel    Order Specific Question:   Has the patient fasted?    Answer:   No  . POCT glycosylated hemoglobin (Hb A1C)  . POCT glucose (manual entry)   No orders of the defined types were placed in this encounter.   Return in about 3 months (around 03/06/2018) for follow-up chronic medical conditions.   Manny Vitolo Elayne Guerin, M.D. Primary [facility] at [facility] [address] [place], [place]  [address] [phone] phone [phone] fax

## 2017-12-06 ENCOUNTER — Other Ambulatory Visit: Payer: Self-pay

## 2017-12-06 NOTE — Telephone Encounter (Signed)
Pt calling back stating she uses the embrace test strips, and she uses the embrace machine. Please send to [name]

## 2017-12-06 NOTE — Telephone Encounter (Signed)
Pt uses Embrace test strips. No frequency of testing listed for this pt as no previous orders for diabetic testing supplies. Please advise.

## 2017-12-07 ENCOUNTER — Other Ambulatory Visit: Payer: Self-pay | Admitting: Family Medicine

## 2017-12-07 NOTE — Progress Notes (Signed)
FYI. Not sure why this came to me.

## 2017-12-07 NOTE — Telephone Encounter (Signed)
Effexor-XR 75 mg 24 hr capsule refill request  LOV 12/05/17 with Dr. Tamala Julian   I don't see where she is still on this med.  [facility] [address], [place]

## 2017-12-10 MED ORDER — LANCETS MISC: 4 refills | Status: DC

## 2017-12-10 MED ORDER — GLUCOSE BLOOD VI STRP: ORAL_STRIP | 4 refills | Status: DC

## 2017-12-10 NOTE — Addendum Note (Signed)
Addended by: [name] on: 12/10/2017 01:12 PM   Modules accepted: Orders

## 2017-12-12 ENCOUNTER — Telehealth: Payer: Self-pay | Admitting: Family Medicine

## 2017-12-12 NOTE — Telephone Encounter (Signed)
Copied from [place] [id number]. Topic: Quick Communication - See Telephone Encounter >> Dec 12, 2017 11:41 AM Percell Belt A wrote: CRM for notification. See Telephone encounter for: 12/12/17. [name]  called in and has questions about pt Meter and the supplies    Best number  [phone]

## 2017-12-13 NOTE — Telephone Encounter (Signed)
Phone call to [place] at [employer] at [facility]. Anderson Malta has further questions regarding lancets and strips, which lancets and test strips does patient need? Anderson Malta states Embrace makes many different meters, Embrace Meter, Embrace Pro, Embrace Evo, and Embrace Talk. Which one does patient have?  Phone call to patient to find out which meter she has. Unable to reach. If patient calls back, please confirm what kind of Embrace meter she has. Is it Pro, Evo, Talk, or is it just the Embrace meter?

## 2017-12-21 ENCOUNTER — Other Ambulatory Visit: Payer: Self-pay

## 2017-12-21 ENCOUNTER — Encounter: Payer: Self-pay | Admitting: Physician Assistant

## 2017-12-21 ENCOUNTER — Other Ambulatory Visit: Payer: Self-pay | Admitting: Rheumatology

## 2017-12-21 ENCOUNTER — Ambulatory Visit: Payer: 59 | Admitting: Physician Assistant

## 2017-12-21 VITALS — BP 122/74 | HR 97 | Temp 98.0°F | Resp 18 | Ht 66.14 in | Wt 167.4 lb

## 2017-12-21 DIAGNOSIS — M5441 Lumbago with sciatica, right side: Secondary | ICD-10-CM

## 2017-12-21 DIAGNOSIS — E1121 Type 2 diabetes mellitus with diabetic nephropathy: Secondary | ICD-10-CM

## 2017-12-21 LAB — GLUCOSE, POCT (MANUAL RESULT ENTRY): POC GLUCOSE: 208 mg/dL — AB (ref 70–99)

## 2017-12-21 MED ORDER — GLUCOSE BLOOD VI STRP: ORAL_STRIP | 4 refills | Status: DC

## 2017-12-21 MED ORDER — MELOXICAM 7.5 MG PO TABS: 7.5000 mg | ORAL_TABLET | Freq: Every day | ORAL | 0 refills | Status: AC

## 2017-12-21 NOTE — Progress Notes (Signed)
Dorothy [name]  MRN: [id number] DOB: [birth year]  Subjective:   Dorothy [name] is a 62 y.o. female who presents for evaluation of low back pain. The patient has had recurrent self limited episodes of low back pain in the past for 15 years. Current symptoms have been present for 3 days and are unchanged.  Onset was related to / precipitated by no known injury and picking up a sofa a few weeks ago and then bending over to pick something up 4 days ago. The pain is located in the right lumbar area and radiates to the right thigh. The pain is described as aching and dull and occurs all day. Symptoms are exacerbated by bending, sitting, and standing. Symptoms are improved by nothing. She has also tried OTC NSAIDs and thermacare pads which provided no symptom relief. Also on gabapentin 300mg  twice daily and skelaxin 800mg  with no relief. This is the worse her sx have been in a while.  She has weakness in the right leg, weakness in the left leg, tingling in the right leg, tingling in the left leg, burning pain in the right leg, burning pain in the left leg, urinary hesitancy, urinary incontinence, urinary retention, bowel incontinence, constipation, impotence and groin/perineal numbness associated with the back pain. Used to have to go and get injections. This tThe patient has no "red flag" history indicative of complicated back pain.  Of note, patient has PMH of type 2 diabetes.  Has not been checking her sugars recently because she ran out of glucose test strips.  Last A1c 2 weeks ago was 7.3.     Review of Systems  Constitutional: Negative for chills, diaphoresis and fever.  Gastrointestinal: Negative for nausea and vomiting.  Genitourinary: Negative for hematuria.      Patient Active Problem List   Diagnosis Date Noted  . Seasonal allergic rhinitis due to pollen 05/11/2017  . Degenerative disc disease, cervical 09/20/2016  . Osteoporosis with current pathological fracture 06/19/2016  . Other  secondary osteoarthritis of both hands 04/15/2016  . Osteoarthritis of pelvis 04/15/2016  . DDD (degenerative disc disease), lumbar 04/15/2016  . Neuropathy 04/15/2016  . IBS (irritable bowel syndrome) 04/15/2016  . Chronic diastolic heart failure ([place]) 06/14/2015  . Premature ventricular contractions 12/10/2013  . Fibromyalgia 10/03/2012  . Abdominal pain, epigastric 10/01/2012  . COLONIC POLYPS, ADENOMATOUS 09/21/2007  . DM2 (diabetes mellitus, type 2) ([place]) 09/21/2007  . HLD (hyperlipidemia) 09/21/2007  . Anxiety state 09/21/2007  . GERD 09/21/2007  . HIATAL HERNIA 09/21/2007  . CHOLEDOCHOLITHIASIS, HX OF 09/21/2007  . Acute gastritis 07/30/2007    Current Outpatient Medications on File Prior to Visit  Medication Sig Dispense Refill  . aspirin 81 MG tablet Take 81 mg by mouth every evening.    . Calcium Carb-Cholecalciferol (CALCIUM 500 +D) 500-400 MG-UNIT TABS Take 1 tablet by mouth daily.     . colestipol (COLESTID) 1 g tablet TAKE ONE TABLET BY MOUTH TWICE A DAY 60 tablet 2  . DEXILANT 60 MG capsule TAKE ONE CAPSULE BY MOUTH DAILY 30 capsule 10  . Dulaglutide (TRULICITY) 3.76 EG/3.1DV SOPN Inject 0.75 mg into the skin once a week. 5 pen 5  . fluticasone (FLONASE) 50 MCG/ACT nasal spray Place 2 sprays into both nostrils daily. 16 g 6  . gabapentin (NEURONTIN) 300 MG capsule TAKE ONE CAPSULE BY MOUTH THREE TIMES A DAY 90 capsule 2  . hydrochlorothiazide (HYDRODIURIL) 25 MG tablet TAKE ONE TABLET BY MOUTH DAILY 30 tablet 0  .  Lancets MISC Check sugar once daily dx DMII controlled, non-insulin, without complications 751 each 4  . losartan (COZAAR) 50 MG tablet TAKE ONE TABLET BY MOUTH DAILY 30 tablet 4  . metaxalone (SKELAXIN) 800 MG tablet Take 800 mg by mouth 3 (three) times daily.    . metFORMIN (GLUCOPHAGE) 500 MG tablet Take 1 tablet (500 mg total) by mouth 2 (two) times daily with a meal. 180 tablet 3  . Misc Natural Products (DANDELION ROOT) 520 MG CAPS Take by mouth 2 (two)  times daily after a meal.    . omega-3 acid ethyl esters (LOVAZA) 1 g capsule TAKE ONE CAPSULE BY MOUTH TWICE A DAY 60 capsule 8  . Potassium Chloride ER 20 MEQ TBCR TAKE TWO TABLETS BY MOUTH DAILY 60 tablet 9  . pramipexole (MIRAPEX) 0.125 MG tablet Take 3 tablets (0.375 mg total) by mouth at bedtime. 270 tablet 3  . simvastatin (ZOCOR) 40 MG tablet TAKE ONE TABLET BY MOUTH EVERY NIGHT AT BEDTIME 30 tablet 5  . venlafaxine XR (EFFEXOR-XR) 75 MG 24 hr capsule TAKE ONE CAPSULE BY MOUTH EVERY EVENING 30 capsule 5  . VOLTAREN 1 % GEL Apply 2 g topically 3 (three) times daily as needed (pain).      No current facility-administered medications on file prior to visit.     Allergies  Allergen Reactions  . Bactrim [Sulfamethoxazole-Trimethoprim] Other (See Comments)    blisters  . Keflex [Cephalexin] Hives  . Macrobid [Nitrofurantoin Macrocrystal] Other (See Comments)    blisters  . Nitrofurantoin Nausea And Vomiting and Other (See Comments)    Blisters   . Sulfa Drugs Cross Reactors Itching  . Adhesive [Tape] Other (See Comments)    Tears skins  . Codeine Itching  . Talwin [Pentazocine] Nausea And Vomiting and Rash    Muscle cramps and vision changes      Social History   Socioeconomic History  . Marital status: Married    Spouse name: Richardson Landry  . Number of children: 3  . Years of education: 10th grade  . Highest education level: Not on file  Occupational History    Employer: [employer]  . Financial resource strain: Not on file  . Food insecurity:    Worry: Not on file    Inability: Not on file  . Transportation needs:    Medical: Not on file    Non-medical: Not on file  Tobacco Use  . Smoking status: Never Smoker  . Smokeless tobacco: Never Used  Substance and Sexual Activity  . Alcohol use: Yes    Alcohol/week: 0.0 oz    Comment: occasional   . Drug use: No  . Sexual activity: Yes    Birth control/protection: Post-menopausal, Surgical   Lifestyle  . Physical activity:    Days per week: Not on file    Minutes per session: Not on file  . Stress: Not on file  Relationships  . Social connections:    Talks on phone: Not on file    Gets together: Not on file    Attends religious service: Not on file    Active member of club or organization: Not on file    Attends meetings of clubs or organizations: Not on file    Relationship status: Not on file  . Intimate partner violence:    Fear of current or ex partner: Not on file    Emotionally abused: Not on file    Physically abused: Not on file  Forced sexual activity: Not on file  Other Topics Concern  . Not on file  Social History Narrative   Marital status:  Married x 22 years; happily married,no abuse. 2nd marriage.      Children: 3 sons (42, 13, 38)---2 sons with substance abuse; 3 grandchildren; 3 step grandchildren; 2 gg.      Lives: with husband, son/Brian.      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      Drugs:none      Exercise: no exercise in 2018      Seatbelt: 100%; no texting      Guns: none      Always uses seat belts. Smoke alarm and carbon monoxide detector in the home.No caffeine use. No unsecured guns in the home.      Objective:  BP 122/74 (BP Location: Left Arm, Patient Position: Sitting, Cuff Size: Normal)   Pulse 97   Temp 98 F (36.7 C) (Oral)   Resp 18   Ht 5' 6.14" (1.68 m)   Wt 167 lb 6.4 oz (75.9 kg)   SpO2 97%   BMI 26.90 kg/m   Physical Exam  Constitutional: She is oriented to person, place, and time. She appears well-developed and well-nourished.  Appears uncomfortable sitting on exam table.  HENT:  Head: Normocephalic and atraumatic.  Eyes: Conjunctivae are normal.  Neck: Normal range of motion.  Pulmonary/Chest: Effort normal.  Musculoskeletal:       Thoracic back: Normal.       Lumbar back: She exhibits tenderness (with palapation  of right sided lumbar and gluteal musculature and with forward flexion and trunk rotation to the left). She exhibits normal range of motion, no bony tenderness, no swelling and no spasm.  Neurological: She is alert and oriented to person, place, and time.  Reflex Scores:      Patellar reflexes are 2+ on the right side and 2+ on the left side.      Achilles reflexes are 2+ on the right side and 2+ on the left side. Sensation of bilateral lower extremities intact.    Muscular strength of bilateral lower extremities 5/5.   Positive SLR of right leg.    Skin: Skin is warm and dry.  Psychiatric: She has a normal mood and affect.  Vitals reviewed.  Results for orders placed or performed in visit on 12/21/17 (from the past 24 hour(s))  POCT glucose (manual entry)     Status: Abnormal   Collection Time: 12/21/17 12:38 PM  Result Value Ref Range   POC Glucose 208 (A) 70 - 99 mg/dl    Assessment and Plan :  1. Acute right-sided low back pain with right-sided sciatica 2. Type 2 diabetes mellitus with diabetic nephropathy, without long-term current use of insulin (HCC)  Hx and PE findings consistent with acute on chronic back pain with R sciatica. Otherwise nml neuro exam. Due to elevated glucose, will tx with NSAIDs as opposed to prednisone at this time. Encouraged pt to increase gabapentin dose to 300mg  TID. Apply ice/heat to affected area 4-5 x a day. Given educational material for stretches.  Advised her to contact our office if no improvement in 3 to 5 days, will send referral to orthopedics at that time as patient may need injection.  Otherwise, follow-up as needed. - POCT glucose (manual entry)  Meds ordered this encounter  Medications  .  glucose blood test strip    Sig: Check sugar once daily  Dx: DMII controlled, non-insulin, without complications    Dispense:  100 each    Refill:  4    Order Specific Question:   Supervising Provider    Answer:   [name] [[id number]]  . meloxicam  (MOBIC) 7.5 MG tablet    Sig: Take 1-2 tablets (7.5-15 mg total) by mouth daily.    Dispense:  30 tablet    Refill:  0    Order Specific Question:   Supervising Provider    Answer:   Reginia Forts M [[id number]]    Tenna Delaine PA-C  [facility] 12/21/2017 12:46 PM

## 2017-12-21 NOTE — Telephone Encounter (Signed)
Last Visit: 09/05/17 Next Visit: 03/13/18  Okay to refill per Dr. Deveshwar  

## 2017-12-21 NOTE — Patient Instructions (Addendum)
I recommend resting today. However, tomorrow I would begin walking and moving around as much as tolerated. Begin stretching in a couple of days. The worse thing you can do for low back pain is lie in bed all day or sit down all day. Use medications as needed. You can increase your gabapentin to 300mg  three times a day and start using meloxicam for inflammation.    NSAIDs like meloxicam have common side effects of heartburn, stomach pain, indigestion, and headache. Could lead to renal insufficiency, stroke, or GI bleed if taken excess amounts outside of what is recommended on label long term.    You should avoid heavy lifting or strenuous repetitive activity to prevent recurrence of event. Experiment with both ice and heat and choose whichever feels best for you.  Use heat pad or ice pack, do not apply directly to skin, use barrier such as towel over the skin. Leave on for 15-20 minutes, 3-4 times a day.  Please perform exercises below. Stretches are to be performed for 2 sets, holding 10-15 seconds each. Recommended to perform this rehab twice daily within pain tolerance for 2 weeks.  Return to clinic if symptoms worsen, do not improve, or as needed    FLEXION RANGE OF MOTION AND STRETCHING EXERCISES: STRETCH - Flexion, Single Knee to Chest   Lie on a firm bed or floor with both legs extended in front of you.  Keeping one leg in contact with the floor, bring your opposite knee to your chest. Hold your leg in place by either grabbing behind your thigh or at your knee.  Pull until you feel a gentle stretch in your lower back.   Slowly release your grasp and repeat the exercise with the opposite side.  STRETCH - Flexion, Double Knee to Chest   Lie on a firm bed or floor with both legs extended in front of you.  Keeping one leg in contact with the floor, bring your opposite knee to your chest.  Tense your stomach muscles to support your back and then lift your other knee to your chest. Hold  your legs in place by either grabbing behind your thighs or at your knees.  Pull both knees toward your chest until you feel a gentle stretch in your lower back.   Tense your stomach muscles and slowly return one leg at a time to the floor.  STRETCH - Low Trunk Rotation  Lie on a firm bed or floor. Keeping your legs in front of you, bend your knees so they are both pointed toward the ceiling and your feet are flat on the floor.  Extend your arms out to the side. This will stabilize your upper body by keeping your shoulders in contact with the floor.  Gently and slowly drop both knees together to one side until you feel a gentle stretch in your lower back.   Tense your stomach muscles to support your lower back as you bring your knees back to the starting position. Repeat the exercise to the other side.   EXTENSION RANGE OF MOTION AND FLEXIBILITY EXERCISES: STRETCH - Extension, Prone on Elbows   Lie on your stomach on the floor, a bed will be too soft. Place your palms about shoulder width apart and at the height of your head.  Place your elbows under your shoulders. If this is too painful, stack pillows under your chest.  Allow your body to relax so that your hips drop lower and make contact more completely with the  floor.  Slowly return to lying flat on the floor.  RANGE OF MOTION - Extension, Prone Press Ups  Lie on your stomach on the floor, a bed will be too soft. Place your palms about shoulder width apart and at the height of your head.  Keeping your back as relaxed as possible, slowly straighten your elbows while keeping your hips on the floor. You may adjust the placement of your hands to maximize your comfort. As you gain motion, your hands will come more underneath your shoulders.  Slowly return to lying flat on the floor.  RANGE OF MOTION- Quadruped, Neutral Spine   Assume a hands and knees position on a firm surface. Keep your hands under your shoulders and your knees  under your hips. You may place padding under your knees for comfort.  Drop your head and point your tail bone toward the ground below you. This will round out your lower back like an angry cat.    Slowly lift your head and release your tail bone so that your back sags into a large arch, like an old horse.  Repeat this until you feel limber in your lower back.  Now, find your "sweet spot." This will be the most comfortable position somewhere between the two previous positions. This is your neutral spine. Once you have found this position, tense your stomach muscles to support your lower back.  STRENGTHENING EXERCISES - Low Back Strain These exercises may help you when beginning to rehabilitate your injury. These exercises should be done near your "sweet spot." This is the neutral, low-back arch, somewhere between fully rounded and fully arched, that is your least painful position. When performed in this safe range of motion, these exercises can be used for people who have either a flexion or extension based injury. These exercises may resolve your symptoms with or without further involvement from your physician, physical therapist or athletic trainer. While completing these exercises, remember:   Muscles can gain both the endurance and the strength needed for everyday activities through controlled exercises.  Complete these exercises as instructed by your physician, physical therapist or athletic trainer. Increase the resistance and repetitions only as guided.  You may experience muscle soreness or fatigue, but the pain or discomfort you are trying to eliminate should never worsen during these exercises. If this pain does worsen, stop and make certain you are following the directions exactly. If the pain is still present after adjustments, discontinue the exercise until you can discuss the trouble with your caregiver.  STRENGTHENING - Deep Abdominals, Pelvic Tilt  Lie on a firm bed or floor.  Keeping your legs in front of you, bend your knees so they are both pointed toward the ceiling and your feet are flat on the floor.  Tense your lower abdominal muscles to press your lower back into the floor. This motion will rotate your pelvis so that your tail bone is scooping upwards rather than pointing at your feet or into the floor.  STRENGTHENING - Abdominals, Crunches   Lie on a firm bed or floor. Keeping your legs in front of you, bend your knees so they are both pointed toward the ceiling and your feet are flat on the floor. Cross your arms over your chest.  Slightly tip your chin down without bending your neck.  Tense your abdominals and slowly lift your trunk high enough to just clear your shoulder blades. Lifting higher can put excessive stress on the lower back and does not further  strengthen your abdominal muscles.  Control your return to the starting position.  STRENGTHENING - Quadruped, Opposite UE/LE Lift   Assume a hands and knees position on a firm surface. Keep your hands under your shoulders and your knees under your hips. You may place padding under your knees for comfort.  Find your neutral spine and gently tense your abdominal muscles so that you can maintain this position. Your shoulders and hips should form a rectangle that is parallel with the floor and is not twisted.  Keeping your trunk steady, lift your right hand no higher than your shoulder and then your left leg no higher than your hip. Make sure you are not holding your breath.   Continuing to keep your abdominal muscles tense and your back steady, slowly return to your starting position. Repeat with the opposite arm and leg.  STRENGTHENING - Lower Abdominals, Double Knee Lift  Lie on a firm bed or floor. Keeping your legs in front of you, bend your knees so they are both pointed toward the ceiling and your feet are flat on the floor.  Tense your abdominal muscles to brace your lower back and slowly lift  both of your knees until they come over your hips. Be certain not to hold your breath.  POSTURE AND BODY MECHANICS CONSIDERATIONS - Low Back Strain Keeping correct posture when sitting, standing or completing your activities will reduce the stress put on different body tissues, allowing injured tissues a chance to heal and limiting painful experiences. The following are general guidelines for improved posture. Your physician or physical therapist will provide you with any instructions specific to your needs. While reading these guidelines, remember:  The exercises prescribed by your provider will help you have the flexibility and strength to maintain correct postures.  The correct posture provides the best environment for your joints to work. All of your joints have less wear and tear when properly supported by a spine with good posture. This means you will experience a healthier, less painful body.  Correct posture must be practiced with all of your activities, especially prolonged sitting and standing. Correct posture is as important when doing repetitive low-stress activities (typing) as it is when doing a single heavy-load activity (lifting). RESTING POSITIONS Consider which positions are most painful for you when choosing a resting position. If you have pain with flexion-based activities (sitting, bending, stooping, squatting), choose a position that allows you to rest in a less flexed posture. You would want to avoid curling into a fetal position on your side. If your pain worsens with extension-based activities (prolonged standing, working overhead), avoid resting in an extended position such as sleeping on your stomach. Most people will find more comfort when they rest with their spine in a more neutral position, neither too rounded nor too arched. Lying on a non-sagging bed on your side with a pillow between your knees, or on your back with a pillow under your knees will often provide some  relief. Keep in mind, being in any one position for a prolonged period of time, no matter how correct your posture, can still lead to stiffness. PROPER SITTING POSTURE In order to minimize stress and discomfort on your spine, you must sit with correct posture. Sitting with good posture should be effortless for a healthy body. Returning to good posture is a gradual process. Many people can work toward this most comfortably by using various supports until they have the flexibility and strength to maintain this posture on their  own. When sitting with proper posture, your ears will fall over your shoulders and your shoulders will fall over your hips. You should use the back of the chair to support your upper back. Your lower back will be in a neutral position, just slightly arched. You may place a small pillow or folded towel at the base of your lower back for support.  When working at a desk, create an environment that supports good, upright posture. Without extra support, muscles tire, which leads to excessive strain on joints and other tissues. Keep these recommendations in mind: CHAIR:  A chair should be able to slide under your desk when your back makes contact with the back of the chair. This allows you to work closely.  The chair's height should allow your eyes to be level with the upper part of your monitor and your hands to be slightly lower than your elbows. BODY POSITION  Your feet should make contact with the floor. If this is not possible, use a foot rest.  Keep your ears over your shoulders. This will reduce stress on your neck and lower back. INCORRECT SITTING POSTURES  If you are feeling tired and unable to assume a healthy sitting posture, do not slouch or slump. This puts excessive strain on your back tissues, causing more damage and pain. Healthier options include:  Using more support, like a lumbar pillow.  Switching tasks to something that requires you to be upright or  walking.  Talking a brief walk.  Lying down to rest in a neutral-spine position. PROLONGED STANDING WHILE SLIGHTLY LEANING FORWARD  When completing a task that requires you to lean forward while standing in one place for a long time, place either foot up on a stationary 2-4 inch high object to help maintain the best posture. When both feet are on the ground, the lower back tends to lose its slight inward curve. If this curve flattens (or becomes too large), then the back and your other joints will experience too much stress, tire more quickly, and can cause pain. CORRECT STANDING POSTURES Proper standing posture should be assumed with all daily activities, even if they only take a few moments, like when brushing your teeth. As in sitting, your ears should fall over your shoulders and your shoulders should fall over your hips. You should keep a slight tension in your abdominal muscles to brace your spine. Your tailbone should point down to the ground, not behind your body, resulting in an over-extended swayback posture.  INCORRECT STANDING POSTURES  Common incorrect standing postures include a forward head, locked knees and/or an excessive swayback. WALKING Walk with an upright posture. Your ears, shoulders and hips should all line-up. PROLONGED ACTIVITY IN A FLEXED POSITION When completing a task that requires you to bend forward at your waist or lean over a low surface, try to find a way to stabilize 3 out of 4 of your limbs. You can place a hand or elbow on your thigh or rest a knee on the surface you are reaching across. This will provide you more stability so that your muscles do not fatigue as quickly. By keeping your knees relaxed, or slightly bent, you will also reduce stress across your lower back. CORRECT LIFTING TECHNIQUES DO :   Assume a wide stance. This will provide you more stability and the opportunity to get as close as possible to the object which you are lifting.  Tense your  abdominals to brace your spine. Bend at the knees  and hips. Keeping your back locked in a neutral-spine position, lift using your leg muscles. Lift with your legs, keeping your back straight.  Test the weight of unknown objects before attempting to lift them.  Try to keep your elbows locked down at your sides in order get the best strength from your shoulders when carrying an object.  Always ask for help when lifting heavy or awkward objects. INCORRECT LIFTING TECHNIQUES DO NOT:   Lock your knees when lifting, even if it is a small object.  Bend and twist. Pivot at your feet or move your feet when needing to change directions.  Assume that you can safely pick up even a paper clip without proper posture.     Sciatica Sciatica is pain, numbness, weakness, or tingling along your sciatic nerve. The sciatic nerve starts in the lower back and goes down the back of each leg. Sciatica happens when this nerve is pinched or has pressure put on it. Sciatica usually goes away on its own or with treatment. Sometimes, sciatica may keep coming back (recur). Follow these instructions at home: Medicines  Take over-the-counter and prescription medicines only as told by your doctor.  Do not drive or use heavy machinery while taking prescription pain medicine. Managing pain  If directed, put ice on the affected area. ? Put ice in a plastic bag. ? Place a towel between your skin and the bag. ? Leave the ice on for 20 minutes, 2-3 times a day.  After icing, apply heat to the affected area before you exercise or as often as told by your doctor. Use the heat source that your doctor tells you to use, such as a moist heat pack or a heating pad. ? Place a towel between your skin and the heat source. ? Leave the heat on for 20-30 minutes. ? Remove the heat if your skin turns bright red. This is especially important if you are unable to feel pain, heat, or cold. You may have a greater risk of getting  burned. Activity  Return to your normal activities as told by your doctor. Ask your doctor what activities are safe for you. ? Avoid activities that make your sciatica worse.  Take short rests during the day. Rest in a lying or standing position. This is usually better than sitting to rest. ? When you rest for a long time, do some physical activity or stretching between periods of rest. ? Avoid sitting for a long time without moving. Get up and move around at least one time each hour.  Exercise and stretch regularly, as told by your doctor.  Do not lift anything that is heavier than 10 lb (4.5 kg) while you have symptoms of sciatica. ? Avoid lifting heavy things even when you do not have symptoms. ? Avoid lifting heavy things over and over.  When you lift objects, always lift in a way that is safe for your body. To do this, you should: ? Bend your knees. ? Keep the object close to your body. ? Avoid twisting. General instructions  Use good posture. ? Avoid leaning forward when you are sitting. ? Avoid hunching over when you are standing.  Stay at a healthy weight.  Wear comfortable shoes that support your feet. Avoid wearing high heels.  Avoid sleeping on a mattress that is too soft or too hard. You might have less pain if you sleep on a mattress that is firm enough to support your back.  Keep all follow-up visits as  told by your doctor. This is important. Contact a doctor if:  You have pain that: ? Wakes you up when you are sleeping. ? Gets worse when you lie down. ? Is worse than the pain you have had in the past. ? Lasts longer than 4 weeks.  You lose weight for without trying. Get help right away if:  You cannot control when you pee (urinate) or poop (have a bowel movement).  You have weakness in any of these areas and it gets worse. ? Lower back. ? Lower belly (pelvis). ? Butt (buttocks). ? Legs.  You have redness or swelling of your back.  You have a  burning feeling when you pee. This information is not intended to replace advice given to you by your health care provider. Make sure you discuss any questions you have with your health care provider. Document Released: 05/23/2008 Document Revised: 01/20/2016 Document Reviewed: 04/23/2015 Elsevier Interactive Patient Education  2018 [employer].    IF you received an x-ray today, you will receive an invoice from [facility]. Please contact [facility] at [phone] with questions or concerns regarding your invoice.   IF you received labwork today, you will receive an invoice from [facility]. Please contact [facility] at [phone] with questions or concerns regarding your invoice.   Our billing staff will not be able to assist you with questions regarding bills from these companies.  You will be contacted with the lab results as soon as they are available. The fastest way to get your results is to activate your My Chart account. Instructions are located on the last page of this paperwork. If you have not heard from [place] regarding the results in 2 weeks, please contact this office.

## 2017-12-27 ENCOUNTER — Telehealth: Payer: Self-pay | Admitting: Physician Assistant

## 2017-12-27 DIAGNOSIS — M5441 Lumbago with sciatica, right side: Secondary | ICD-10-CM

## 2017-12-27 NOTE — Telephone Encounter (Signed)
Copied from [place] [id number]. Topic: Referral - Request >> Dec 26, 2017 12:37 PM Scherrie Gerlach wrote: Reason for CRM: pt seen last Friday and was told if not any better to call and get referral to an orthopedic. Pt states she is not better and would like a referral. Pt states she saw Dr Ezequiel Kayser ortho about 10 yrs ago and he would be ok  -----------------------------------------------------------------------------------------  Please advise. Thanks!

## 2017-12-28 NOTE — Telephone Encounter (Signed)
Please call patient report the referral has been placed.  Also, please let her know that it can take up to 2 weeks for her to hear from this referral.

## 2017-12-28 NOTE — Telephone Encounter (Signed)
Phone call to patient, advised of referral. Patient verbalizes understanding.

## 2018-01-01 ENCOUNTER — Telehealth: Payer: Self-pay

## 2018-01-01 NOTE — Telephone Encounter (Signed)
Advised pharmacy that pt uses [facility].   Copied from [place] [phone]. Topic: Quick Communication - Rx Refill/Question >> Dec 31, 2017  4:51 PM Mcneil, Ja-Kwan wrote: Medication: glucose blood test strip  Mali Hooper with [facility] request specific brand test strip name   Has the patient contacted their pharmacy? yes   Preferred Pharmacy (with phone number or street name): [name] [address] [place] - [address] [phone] (Phone) [phone] (Fax)  Agent: Please be advised that RX refills may take up to 3 business days. We ask that you follow-up with your pharmacy.

## 2018-01-21 ENCOUNTER — Encounter: Payer: Self-pay | Admitting: Family Medicine

## 2018-02-11 ENCOUNTER — Telehealth: Payer: Self-pay | Admitting: Family Medicine

## 2018-02-11 NOTE — Telephone Encounter (Unsigned)
Copied from [place] [phone]. Topic: Quick Communication - Rx Refill/Question >> Feb 11, 2018 11:46 AM Neva Seat wrote: Needing generic meter - strips and lancets   [facility] - Phone: [phone] Address: [address], [place], [place] [address]

## 2018-02-12 ENCOUNTER — Other Ambulatory Visit: Payer: Self-pay | Admitting: Family Medicine

## 2018-02-12 ENCOUNTER — Other Ambulatory Visit: Payer: Self-pay | Admitting: *Deleted

## 2018-02-12 ENCOUNTER — Other Ambulatory Visit: Payer: Self-pay | Admitting: Gastroenterology

## 2018-02-12 DIAGNOSIS — E1121 Type 2 diabetes mellitus with diabetic nephropathy: Secondary | ICD-10-CM

## 2018-02-12 MED ORDER — LANCETS MISC: 4 refills | Status: DC

## 2018-02-12 MED ORDER — GLUCOSE BLOOD VI STRP: ORAL_STRIP | 4 refills | Status: DC

## 2018-02-12 MED ORDER — BLOOD GLUCOSE METER KIT: PACK | 11 refills | Status: AC

## 2018-02-12 NOTE — Telephone Encounter (Signed)
PRESCRIPTION FAXED TO [facility] [place]

## 2018-02-20 ENCOUNTER — Other Ambulatory Visit: Payer: Self-pay | Admitting: Family Medicine

## 2018-02-20 NOTE — Telephone Encounter (Signed)
Refill request for hctz 25 mg #30 with no refills approved.  Pt last visit on 12/05/2017 and next visit 03/13/2018 with Tamala Julian. Dgaddy, CMA

## 2018-02-22 ENCOUNTER — Other Ambulatory Visit: Payer: Self-pay | Admitting: Rheumatology

## 2018-02-22 NOTE — Telephone Encounter (Signed)
Last Visit: 09/05/17 Next Visit: 03/13/18  Okay to refill per Dr. Estanislado Pandy

## 2018-02-27 NOTE — Progress Notes (Signed)
Office Visit Note  Patient: Dorothy [name]             Date of Birth: 04/07/1956           MRN: [id number]             PCP: [name], MD Referring: [name], MD Visit Date: 03/13/2018 Occupation: @GUAROCC @    Subjective:  Swelling in hands.   History of Present Illness: Dorothy [name] is a 62 y.o. female with history of osteoarthritis, DDD and fibromyalgia syndrome.  She states she notices intermittent swelling in her hands especially when she is typing for long time.  She is not having much discomfort in the SI joints.  The neck and lower back pain is tolerable.  Her fibromyalgia symptoms are stable.  She is currently on gabapentin 300 mg 3 times daily which has been helpful.  She is also on the Skelaxin 800 mg a day  and Mobic as needed.  Activities of Daily Living:  Patient reports morning stiffness for 20-30 minutes.   Patient Denies nocturnal pain.  Difficulty dressing/grooming: Denies Difficulty climbing stairs: Reports Difficulty getting out of chair: Denies Difficulty using hands for taps, buttons, cutlery, and/or writing: Denies   Review of Systems  Constitutional: Negative for fatigue, night sweats, weight gain and weight loss.  HENT: Positive for mouth dryness. Negative for mouth sores, trouble swallowing, trouble swallowing and nose dryness.   Eyes: Negative for pain, redness, visual disturbance and dryness.  Respiratory: Negative for cough, shortness of breath and difficulty breathing.   Cardiovascular: Negative for chest pain, palpitations, hypertension, irregular heartbeat and swelling in legs/feet.  Gastrointestinal: Negative for abdominal pain, blood in stool, constipation and diarrhea.  Endocrine: Negative for increased urination.  Genitourinary: Negative for pelvic pain and vaginal dryness.  Musculoskeletal: Positive for arthralgias, joint pain, joint swelling and morning stiffness. Negative for myalgias, muscle weakness, muscle tenderness and  myalgias.  Skin: Negative for color change, rash, hair loss, skin tightness, ulcers and sensitivity to sunlight.  Allergic/Immunologic: Negative for susceptible to infections.  Neurological: Negative for dizziness, light-headedness, headaches, memory loss, night sweats and weakness.  Hematological: Negative for bruising/bleeding tendency and swollen glands.  Psychiatric/Behavioral: Negative for depressed mood, confusion and sleep disturbance. The patient is not nervous/anxious.     PMFS History:  Patient Active Problem List   Diagnosis Date Noted  . Seasonal allergic rhinitis due to pollen 05/11/2017  . Degenerative disc disease, cervical 09/20/2016  . Osteoporosis 06/19/2016  . Other secondary osteoarthritis of both hands 04/15/2016  . Osteoarthritis of pelvis 04/15/2016  . DDD (degenerative disc disease), lumbar 04/15/2016  . Neuropathy 04/15/2016  . IBS (irritable bowel syndrome) 04/15/2016  . Chronic diastolic heart failure ([place]) 06/14/2015  . Premature ventricular contractions 12/10/2013  . Fibromyalgia 10/03/2012  . Abdominal pain, epigastric 10/01/2012  . COLONIC POLYPS, ADENOMATOUS 09/21/2007  . DM2 (diabetes mellitus, type 2) ([place]) 09/21/2007  . HLD (hyperlipidemia) 09/21/2007  . Anxiety state 09/21/2007  . GERD 09/21/2007  . HIATAL HERNIA 09/21/2007  . CHOLEDOCHOLITHIASIS, HX OF 09/21/2007  . Acute gastritis 07/30/2007    Past Medical History:  Diagnosis Date  . Allergic rhinitis, cause unspecified   . Allergy   . Anxiety   . B12 deficiency   . Benign paroxysmal positional vertigo   . Choledocholithiasis   . Congestive heart failure ([place])   . Depression   . Diabetes mellitus   . Fatty liver   . Fatty liver 09/04/12  . Fibromyalgia   .  Gastritis   . GERD (gastroesophageal reflux disease)   . Hematuria, unspecified   . Hiatal hernia   . Hx of adenomatous colonic polyps   . Hx of cardiovascular stress test    ETT-Myoview (12/15):  No ischemia.  No ECG changes.   EF 73%.  Low Risk.   [name] Hx of echocardiogram    Echo (12/15):  EF 60-65%, no RWMA, normal diast function, mild RVE  . Hyperlipidemia   . Meniere's disease, unspecified   . Migraine   . Mononeuritis of unspecified site   . Osteoarthritis   . Osteoporosis, unspecified   . Otosclerosis, unspecified   . Peptic ulcer, unspecified site, unspecified as acute or chronic, without mention of hemorrhage, perforation, or obstruction   . Symptomatic menopausal or female climacteric states   . Unspecified disorder of kidney and ureter     Family History  Problem Relation Age of Onset  . Prostate cancer Father   . Lung disease Father   . Diabetes Father   . Heart disease Father 30       CHF, AMI multiple/CABG  . COPD Father   . Heart attack Father   . Cancer Father        testicular cancer  . Diabetes Mother   . Hypertension Mother   . Hyperlipidemia Mother   . COPD Mother   . Cancer Mother        lung cancer  . Diabetes Brother   . Hypertension Brother   . Obesity Brother   . Hyperlipidemia Brother   . COPD Brother   . Stroke Maternal Grandmother   . Heart disease Maternal Grandfather   . COPD Paternal Grandmother   . Diabetes Paternal Grandmother   . COPD Paternal Grandfather   . Heart attack Paternal Grandfather   . Colon cancer Cousin        died age 24  . Breast cancer Maternal Aunt   . Colon cancer Cousin   . Colon polyps Neg Hx   . Esophageal cancer Neg Hx   . Rectal cancer Neg Hx   . Stomach cancer Neg Hx    Past Surgical History:  Procedure Laterality Date  . BILE DUCT EXPLORATION     gallstone removed  . BREAST ENHANCEMENT SURGERY Bilateral   . BUNIONECTOMY Left 12/2013  . CHOLECYSTECTOMY    . COLONOSCOPY    . FOOT SURGERY     right  . 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 MOUTH  SURGERY    . SHOULDER SURGERY     left  . TOTAL ABDOMINAL HYSTERECTOMY     pelvic pain/scar tissue; ovaries intact.  . TUBAL LIGATION     Social History   Social History Narrative   Marital status:  Married x 22 years; happily married,no abuse. 2nd marriage.      Children: 3 sons (42, 4, 38)---2 sons with substance abuse; 3 grandchildren; 3 step grandchildren; 2 gg.      Lives: with husband, son/Brian.      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      Drugs:none      Exercise: no exercise in 2018      Seatbelt: 100%; no texting  Guns: none      Always uses seat belts. Smoke alarm and carbon monoxide detector in the home.No caffeine use. No unsecured guns in the home.      Objective: Vital Signs: BP 125/80 (BP Location: Left Arm, Patient Position: Sitting, Cuff Size: Normal)   Pulse 84   Resp 13   Ht 5\' 5"  (1.651 m)   Wt 164 lb (74.4 kg)   BMI 27.29 kg/m    Physical Exam  Constitutional: She is oriented to person, place, and time. She appears well-developed and well-nourished.  HENT:  Head: Normocephalic and atraumatic.  Eyes: Conjunctivae and EOM are normal.  Neck: Normal range of motion.  Cardiovascular: Normal rate, regular rhythm, normal heart sounds and intact distal pulses.  Pulmonary/Chest: Effort normal and breath sounds normal.  Abdominal: Soft. Bowel sounds are normal.  Lymphadenopathy:    She has no cervical adenopathy.  Neurological: She is alert and oriented to person, place, and time.  Skin: Skin is warm and dry. Capillary refill takes less than 2 seconds.  Psychiatric: She has a normal mood and affect. Her behavior is normal.  Nursing note and vitals reviewed.    Musculoskeletal Exam: C-spine thoracic lumbar spine are in good range of motion without discomfort.  Shoulder joints, elbow joints, wrist joints were in good range of motion.  I did not see any swelling  over MCP joints.  She has DIP PIP thickening in her hands.  Hip joints knee joints ankles MTPs PIPs were in good range of motion.  She has few tender points.  CDAI Exam: No CDAI exam completed.    Investigation: No additional findings. CBC Latest Ref Rng & Units 12/05/2017 09/05/2017 05/09/2017  WBC 3.4 - 10.8 x10E3/uL 4.8 4.6 4.5  Hemoglobin 11.1 - 15.9 g/dL 14.8 14.6 13.5  Hematocrit 34.0 - 46.6 % 45.7 43.3 42.2  Platelets 150 - 379 x10E3/uL 252 213 216   CMP Latest Ref Rng & Units 12/05/2017 09/05/2017 05/09/2017  Glucose 65 - 99 mg/dL 165(H) 193(H) 200(H)  BUN 8 - 27 mg/dL 20 23 17   Creatinine 0.57 - 1.00 mg/dL 0.89 0.93 0.89  Sodium 134 - 144 mmol/L 139 139 142  Potassium 3.5 - 5.2 mmol/L 3.6 3.5 3.8  Chloride 96 - 106 mmol/L 98 99 100  CO2 20 - 29 mmol/L 24 23 20   Calcium 8.7 - 10.3 mg/dL 9.5 9.9 9.6  Total Protein 6.0 - 8.5 g/dL 6.4 6.8 6.6  Total Bilirubin 0.0 - 1.2 mg/dL 0.6 0.5 0.4  Alkaline Phos 39 - 117 IU/L 80 82 73  AST 0 - 40 IU/L 22 23 42(H)  ALT 0 - 32 IU/L 37(H) 38(H) 52(H)    Imaging: No results found.  Speciality Comments: No specialty comments available.    Procedures:  No procedures performed Allergies: Bactrim [sulfamethoxazole-trimethoprim]; Keflex [cephalexin]; Macrobid [nitrofurantoin macrocrystal]; Nitrofurantoin; Sulfa drugs cross reactors; Adhesive [tape]; Codeine; and Talwin [pentazocine]   Assessment / Plan:     Visit Diagnoses: Fibromyalgia-she continues to have some generalized pain and discomfort which is manageable with current medication regimen.  Overall her fibromyalgia is fairly well controlled.  Need for regular exercise was emphasized.  Prescription refill for Skelaxin was given.  DDD (degenerative disc disease), cervical-she has good range of motion without discomfort.  DDD (degenerative disc disease), lumbar-she has off-and-on discomfort.  Primary osteoarthritis of both hands-she gives history of intermittent swelling over MCP joints.   I do not see any synovitis on examination.  She has DIP PIP thickening  consistent with osteoarthritis.  Natural anti-inflammatories were discussed.  Hand muscle strengthening exercises were discussed.  If she has persistent swelling we may consider RF, anti-CCP, ANA and uric acid next visit.  She has had ultrasound in the past and also x-rays in January 2019 which were consistent with osteoarthritis.  Prescription for Voltaren gel was refilled today.  Other osteoporosis with current pathological fracture, sequela-followed up by her PCP.  History of hyperlipidemia  History of diabetes mellitus  History of gastroesophageal reflux (GERD)  History of IBS  History of neuropathy  History of anxiety    Orders: No orders of the defined types were placed in this encounter.  Meds ordered this encounter  Medications  . metaxalone (SKELAXIN) 800 MG tablet    Sig: Take 1 tablet (800 mg total) by mouth daily.    Dispense:  30 tablet    Refill:  2  . diclofenac sodium (VOLTAREN) 1 % GEL    Sig: Apply 3 grams to 3 large joints up to 3 times daily as needed.    Dispense:  3 Tube    Refill:  3    Face-to-face time spent with patient was 30 minutes. Greater than 50% of time was spent in counseling and coordination of care.  Follow-Up Instructions: Return in about 6 months (around 09/13/2018) for FMS, OA, DDD.   Bo Merino, MD  Note - This record has been created using Editor, commissioning.  Chart creation errors have been sought, but may not always  have been located. Such creation errors do not reflect on  the standard of medical care.

## 2018-03-13 ENCOUNTER — Encounter: Payer: Self-pay | Admitting: Family Medicine

## 2018-03-13 ENCOUNTER — Other Ambulatory Visit: Payer: Self-pay

## 2018-03-13 ENCOUNTER — Ambulatory Visit: Payer: 59 | Admitting: Family Medicine

## 2018-03-13 ENCOUNTER — Encounter: Payer: Self-pay | Admitting: Rheumatology

## 2018-03-13 ENCOUNTER — Ambulatory Visit: Payer: 59 | Admitting: Rheumatology

## 2018-03-13 VITALS — BP 125/80 | HR 84 | Resp 13 | Ht 65.0 in | Wt 164.0 lb

## 2018-03-13 VITALS — BP 110/82 | HR 81 | Temp 98.6°F | Resp 16 | Ht 65.16 in | Wt 164.0 lb

## 2018-03-13 DIAGNOSIS — F411 Generalized anxiety disorder: Secondary | ICD-10-CM | POA: Diagnosis not present

## 2018-03-13 DIAGNOSIS — I5032 Chronic diastolic (congestive) heart failure: Secondary | ICD-10-CM | POA: Diagnosis not present

## 2018-03-13 DIAGNOSIS — M1610 Unilateral primary osteoarthritis, unspecified hip: Secondary | ICD-10-CM | POA: Diagnosis not present

## 2018-03-13 DIAGNOSIS — M19042 Primary osteoarthritis, left hand: Secondary | ICD-10-CM

## 2018-03-13 DIAGNOSIS — E1121 Type 2 diabetes mellitus with diabetic nephropathy: Secondary | ICD-10-CM | POA: Diagnosis not present

## 2018-03-13 DIAGNOSIS — Z8669 Personal history of other diseases of the nervous system and sense organs: Secondary | ICD-10-CM

## 2018-03-13 DIAGNOSIS — M5030 Other cervical disc degeneration, unspecified cervical region: Secondary | ICD-10-CM

## 2018-03-13 DIAGNOSIS — M5136 Other intervertebral disc degeneration, lumbar region: Secondary | ICD-10-CM

## 2018-03-13 DIAGNOSIS — K219 Gastro-esophageal reflux disease without esophagitis: Secondary | ICD-10-CM

## 2018-03-13 DIAGNOSIS — M797 Fibromyalgia: Secondary | ICD-10-CM | POA: Diagnosis not present

## 2018-03-13 DIAGNOSIS — Z8719 Personal history of other diseases of the digestive system: Secondary | ICD-10-CM

## 2018-03-13 DIAGNOSIS — E7800 Pure hypercholesterolemia, unspecified: Secondary | ICD-10-CM

## 2018-03-13 DIAGNOSIS — Z8639 Personal history of other endocrine, nutritional and metabolic disease: Secondary | ICD-10-CM

## 2018-03-13 DIAGNOSIS — M19041 Primary osteoarthritis, right hand: Secondary | ICD-10-CM

## 2018-03-13 DIAGNOSIS — J301 Allergic rhinitis due to pollen: Secondary | ICD-10-CM

## 2018-03-13 DIAGNOSIS — K580 Irritable bowel syndrome with diarrhea: Secondary | ICD-10-CM

## 2018-03-13 DIAGNOSIS — M51369 Other intervertebral disc degeneration, lumbar region without mention of lumbar back pain or lower extremity pain: Secondary | ICD-10-CM

## 2018-03-13 DIAGNOSIS — Z8659 Personal history of other mental and behavioral disorders: Secondary | ICD-10-CM

## 2018-03-13 DIAGNOSIS — M8080XS Other osteoporosis with current pathological fracture, unspecified site, sequela: Secondary | ICD-10-CM

## 2018-03-13 LAB — LIPID PANEL
CHOL/HDL RATIO: 2.7 ratio (ref 0.0–4.4)
Cholesterol, Total: 190 mg/dL (ref 100–199)
HDL: 70 mg/dL (ref >39)
LDL CALC: 94 mg/dL (ref 0–99)
Triglycerides: 130 mg/dL (ref 0–149)
VLDL CHOLESTEROL CAL: 26 mg/dL (ref 5–40)

## 2018-03-13 LAB — HEMOGLOBIN A1C
ESTIMATED AVERAGE GLUCOSE: 169 mg/dL
HEMOGLOBIN A1C: 7.5 % — AB (ref 4.8–5.6)

## 2018-03-13 LAB — COMPREHENSIVE METABOLIC PANEL
ALBUMIN: 4.7 g/dL (ref 3.6–4.8)
ALK PHOS: 80 IU/L (ref 39–117)
ALT: 26 IU/L (ref 0–32)
AST: 19 IU/L (ref 0–40)
Albumin/Globulin Ratio: 2.1 (ref 1.2–2.2)
BUN / CREAT RATIO: 22 (ref 12–28)
BUN: 21 mg/dL (ref 8–27)
Bilirubin Total: 0.5 mg/dL (ref 0.0–1.2)
CO2: 27 mmol/L (ref 20–29)
CREATININE: 0.96 mg/dL (ref 0.57–1.00)
Calcium: 10 mg/dL (ref 8.7–10.3)
Chloride: 99 mmol/L (ref 96–106)
GFR calc Af Amer: 74 mL/min/{1.73_m2} (ref >59)
GFR, EST NON AFRICAN AMERICAN: 64 mL/min/{1.73_m2} (ref >59)
GLOBULIN, TOTAL: 2.2 g/dL (ref 1.5–4.5)
Glucose: 176 mg/dL — ABNORMAL HIGH (ref 65–99)
Potassium: 3.4 mmol/L — ABNORMAL LOW (ref 3.5–5.2)
SODIUM: 143 mmol/L (ref 134–144)
Total Protein: 6.9 g/dL (ref 6.0–8.5)

## 2018-03-13 LAB — GLUCOSE, POCT (MANUAL RESULT ENTRY): POC GLUCOSE: 158 mg/dL — AB (ref 70–99)

## 2018-03-13 MED ORDER — DEXLANSOPRAZOLE 60 MG PO CPDR: 1.0000 | DELAYED_RELEASE_CAPSULE | Freq: Every day | ORAL | 3 refills | Status: AC

## 2018-03-13 MED ORDER — METFORMIN HCL 1000 MG PO TABS: 1000.0000 mg | ORAL_TABLET | Freq: Two times a day (BID) | ORAL | 3 refills | Status: DC

## 2018-03-13 MED ORDER — SIMVASTATIN 40 MG PO TABS: 40.0000 mg | ORAL_TABLET | Freq: Every day | ORAL | 3 refills | Status: AC

## 2018-03-13 MED ORDER — METAXALONE 800 MG PO TABS: 800.0000 mg | ORAL_TABLET | Freq: Every day | ORAL | 2 refills | Status: AC

## 2018-03-13 MED ORDER — DICLOFENAC SODIUM 1 % TD GEL: TRANSDERMAL | 3 refills | Status: AC

## 2018-03-13 MED ORDER — FLUTICASONE PROPIONATE 50 MCG/ACT NA SUSP: 2.0000 | Freq: Every day | NASAL | 11 refills | Status: AC

## 2018-03-13 MED ORDER — HYDROCHLOROTHIAZIDE 25 MG PO TABS: 25.0000 mg | ORAL_TABLET | Freq: Every day | ORAL | 3 refills | Status: AC

## 2018-03-13 MED ORDER — VENLAFAXINE HCL ER 75 MG PO CP24: 75.0000 mg | ORAL_CAPSULE | Freq: Every evening | ORAL | 3 refills | Status: AC

## 2018-03-13 MED ORDER — LOSARTAN POTASSIUM 50 MG PO TABS: 50.0000 mg | ORAL_TABLET | Freq: Every day | ORAL | 3 refills | Status: AC

## 2018-03-13 NOTE — Progress Notes (Signed)
Subjective:    Patient ID: Dorothy [name], female    DOB: 1956-03-17, 62 y.o.   MRN: [id number]  03/13/2018  Chronic Conditions (3 month follow-up )    HPI This 62 y.o. female presents for evaluation of DMII, hypercholesterolemia, fibromyalgia, anxiety/depression.  Management changes made at last visit include the following: New onset epigastric pain: Obtain abdominal ultrasound and labs.  GERD currently well controlled. Diabetes mellitus type 2: Uncontrolled at last visit.  Tolerating Trulicity moderately well with some transient nausea.  Has appreciated weight loss since initiating Trulicity therapy. Anxiety: Well-controlled currently. Status post cardiology consultation for chest pain and dyspnea on exertion since last visit.  No further work-up recommended by Dr. Gwenlyn Found.  No recurrent chest pain since that time.         UPDATE FROM LAST VISIT: Lives in [place].Trying to decide who to see.  Takes 30 minutes; takes 20 minutes to get to [place]. Everything is good; having a bit of pressure and pain in R sinus; onset two days ago.  Nasal congestion x mild for two days.  No fever/chills/sweats.  No bad allergies; pulled weeds.  Is out of Flonase and requesting refill. After last visit, abdominal pain resolved on own.  Did not undergo abdominal ultrasound due to resolution of symptoms. Patient will be changing insurance in September, and she will not be able to afford the co-pays most likely on Trulicity and Farxiga.  She is no longer suffering with diarrhea since gastroenterologist started her on colestipol and requesting to increase metformin dose. Emotionally doing well.  One son is maintained on methadone and has moved to [place].  He is working.  Other son is not doing well continues to suffer with drug addiction.  Coping well despite the stressors.  BP Readings from Last 3 Encounters:  03/13/18 125/80  03/13/18 110/82  12/21/17 122/74   Wt Readings from Last 3  Encounters:  03/13/18 164 lb (74.4 kg)  03/13/18 164 lb (74.4 kg)  12/21/17 167 lb 6.4 oz (75.9 kg)   Immunization History  Administered Date(s) Administered  . Hepatitis B 12/14/2011, 06/11/2012, 01/07/2013  . Influenza Split 06/11/2012  . Influenza,inj,Quad PF,6+ Mos 07/28/2013, 07/15/2014, 06/14/2015, 07/26/2016, 05/09/2017  . Pneumococcal Polysaccharide-23 06/14/2015  . Pneumococcal-Unspecified 08/29/2007  . Tdap 08/28/2008    Review of Systems  Constitutional: Negative for activity change, appetite change, chills, diaphoresis, fatigue, fever and unexpected weight change.  HENT: Positive for congestion, postnasal drip, rhinorrhea and sinus pressure. Negative for dental problem, drooling, ear discharge, ear pain, facial swelling, hearing loss, mouth sores, nosebleeds, sneezing, sore throat, tinnitus, trouble swallowing and voice change.   Eyes: Negative for photophobia, pain, discharge, redness, itching and visual disturbance.  Respiratory: Negative for apnea, cough, choking, chest tightness, shortness of breath, wheezing and stridor.   Cardiovascular: Negative for chest pain, palpitations and leg swelling.  Gastrointestinal: Negative for abdominal distention, abdominal pain, anal bleeding, blood in stool, constipation, diarrhea, nausea, rectal pain and vomiting.  Endocrine: Negative for cold intolerance, heat intolerance, polydipsia, polyphagia and polyuria.  Genitourinary: Negative for decreased urine volume, difficulty urinating, dyspareunia, dysuria, enuresis, flank pain, frequency, genital sores, hematuria, menstrual problem, pelvic pain, urgency, vaginal bleeding, vaginal discharge and vaginal pain.  Musculoskeletal: Positive for arthralgias and myalgias. Negative for back pain, gait problem, joint swelling, neck pain and neck stiffness.  Skin: Negative for color change, pallor, rash and wound.  Allergic/Immunologic: Negative for environmental allergies, food allergies and  immunocompromised state.  Neurological: Negative for dizziness, tremors,  seizures, syncope, facial asymmetry, speech difficulty, weakness, light-headedness, numbness and headaches.  Hematological: Negative for adenopathy. Does not bruise/bleed easily.  Psychiatric/Behavioral: Negative for agitation, behavioral problems, confusion, decreased concentration, dysphoric mood, hallucinations, self-injury, sleep disturbance and suicidal ideas. The patient is nervous/anxious. The patient is not hyperactive.     Past Medical History:  Diagnosis Date  . Allergic rhinitis, cause unspecified   . Allergy   . Anxiety   . B12 deficiency   . Benign paroxysmal positional vertigo   . Choledocholithiasis   . Congestive heart failure ([place])   . Depression   . Diabetes mellitus   . Fatty liver   . Fatty liver 09/04/12  . Fibromyalgia   . Gastritis   . GERD (gastroesophageal reflux disease)   . Hematuria, unspecified   . Hiatal hernia   . Hx of adenomatous colonic polyps   . Hx of cardiovascular stress test    ETT-Myoview (12/15):  No ischemia.  No ECG changes.  EF 73%.  Low Risk.   [name] Hx of echocardiogram    Echo (12/15):  EF 60-65%, no RWMA, normal diast function, mild RVE  . Hyperlipidemia   . Meniere's disease, unspecified   . Migraine   . Mononeuritis of unspecified site   . Osteoarthritis   . Osteoporosis, unspecified   . Otosclerosis, unspecified   . Peptic ulcer, unspecified site, unspecified as acute or chronic, without mention of hemorrhage, perforation, or obstruction   . Symptomatic menopausal or female climacteric states   . Unspecified disorder of kidney and ureter    Past Surgical History:  Procedure Laterality Date  . BILE DUCT EXPLORATION     gallstone removed  . BREAST ENHANCEMENT SURGERY Bilateral   . BUNIONECTOMY Left 12/2013  . CHOLECYSTECTOMY    . COLONOSCOPY    . FOOT SURGERY     right  . 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 MOUTH SURGERY    . SHOULDER SURGERY     left  . TOTAL ABDOMINAL HYSTERECTOMY     pelvic pain/scar tissue; ovaries intact.  . TUBAL LIGATION     Allergies  Allergen Reactions  . Bactrim [Sulfamethoxazole-Trimethoprim] Other (See Comments)    blisters  . Keflex [Cephalexin] Hives  . Macrobid [Nitrofurantoin Macrocrystal] Other (See Comments)    blisters  . Nitrofurantoin Nausea And Vomiting and Other (See Comments)    Blisters   . Sulfa Drugs Cross Reactors Itching  . Adhesive [Tape] Other (See Comments)    Tears skins  . Codeine Itching  . Talwin [Pentazocine] Nausea And Vomiting and Rash    Muscle cramps and vision changes   Current Outpatient Medications on File Prior to Visit  Medication Sig Dispense Refill  . aspirin 81 MG tablet Take 81 mg by mouth every evening.    . blood glucose meter kit and supplies Based on nsurance preference. Check glucose once a day. E11.65 1 each 11  . Calcium Carb-Cholecalciferol (CALCIUM 500 +D) 500-400 MG-UNIT TABS Take 1 tablet by mouth daily.     . colestipol (COLESTID) 1 g tablet TAKE ONE TABLET BY MOUTH TWICE A DAY 60 tablet 1  . Dulaglutide (TRULICITY) 1.60 FU/9.3AT SOPN Inject 0.75 mg into the skin once a week. 5 pen 5  . FARXIGA 10 MG TABS tablet     . gabapentin (NEURONTIN) 300  MG capsule TAKE ONE CAPSULE BY MOUTH THREE TIMES A DAY 90 capsule 0  . glucose blood test strip Check sugar once daily  Dx: DMII controlled, non-insulin, without complications 353 each 4  . Lancets MISC Check sugar once daily dx DMII controlled, non-insulin, without complications 614 each 4  . meloxicam (MOBIC) 7.5 MG tablet Take 1-2 tablets (7.5-15 mg total) by mouth daily. 30 tablet 0  . Misc Natural Products (DANDELION ROOT) 520 MG CAPS Take by mouth 2 (two) times daily after a meal.    . omega-3 acid ethyl esters  (LOVAZA) 1 g capsule TAKE ONE CAPSULE BY MOUTH TWICE A DAY 60 capsule 8  . Potassium Chloride ER 20 MEQ TBCR TAKE TWO TABLETS BY MOUTH DAILY 60 tablet 9  . pramipexole (MIRAPEX) 0.125 MG tablet Take 3 tablets (0.375 mg total) by mouth at bedtime. 270 tablet 3  . diclofenac sodium (VOLTAREN) 1 % GEL Apply 3 grams to 3 large joints up to 3 times daily as needed. 3 Tube 3  . metaxalone (SKELAXIN) 800 MG tablet Take 1 tablet (800 mg total) by mouth daily. 30 tablet 2   No current facility-administered medications on file prior to visit.    Social History   Socioeconomic History  . Marital status: Married    Spouse name: Richardson Landry  . Number of children: 3  . Years of education: 10th grade  . Highest education level: Not on file  Occupational History    Employer: [employer]  . Financial resource strain: Not on file  . Food insecurity:    Worry: Not on file    Inability: Not on file  . Transportation needs:    Medical: Not on file    Non-medical: Not on file  Tobacco Use  . Smoking status: Never Smoker  . Smokeless tobacco: Never Used  Substance and Sexual Activity  . Alcohol use: Yes    Alcohol/week: 0.0 oz    Comment: occasional   . Drug use: Never  . Sexual activity: Yes    Birth control/protection: Post-menopausal, Surgical  Lifestyle  . Physical activity:    Days per week: Not on file    Minutes per session: Not on file  . Stress: Not on file  Relationships  . Social connections:    Talks on phone: Not on file    Gets together: Not on file    Attends religious service: Not on file    Active member of club or organization: Not on file    Attends meetings of clubs or organizations: Not on file    Relationship status: Not on file  . Intimate partner violence:    Fear of current or ex partner: Not on file    Emotionally abused: Not on file    Physically abused: Not on file    Forced sexual activity: Not on file  Other Topics Concern  .  Not on file  Social History Narrative   Marital status:  Married x 22 years; happily married,no abuse. 2nd marriage.      Children: 3 sons (42, 41, 38)---2 sons with substance abuse; 3 grandchildren; 3 step grandchildren; 2 gg.      Lives: with husband, son/Brian.      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      Drugs:none      Exercise:  no exercise in 2018      Seatbelt: 100%; no texting      Guns: none      Always uses seat belts. Smoke alarm and carbon monoxide detector in the home.No caffeine use. No unsecured guns in the home.    Family History  Problem Relation Age of Onset  . Prostate cancer Father   . Lung disease Father   . Diabetes Father   . Heart disease Father 9       CHF, AMI multiple/CABG  . COPD Father   . Heart attack Father   . Cancer Father        testicular cancer  . Diabetes Mother   . Hypertension Mother   . Hyperlipidemia Mother   . COPD Mother   . Cancer Mother        lung cancer  . Diabetes Brother   . Hypertension Brother   . Obesity Brother   . Hyperlipidemia Brother   . COPD Brother   . Stroke Maternal Grandmother   . Heart disease Maternal Grandfather   . COPD Paternal Grandmother   . Diabetes Paternal Grandmother   . COPD Paternal Grandfather   . Heart attack Paternal Grandfather   . Colon cancer Cousin        died age 67  . Breast cancer Maternal Aunt   . Colon cancer Cousin   . Colon polyps Neg Hx   . Esophageal cancer Neg Hx   . Rectal cancer Neg Hx   . Stomach cancer Neg Hx        Objective:    BP 110/82   Pulse 81   Temp 98.6 F (37 C) (Oral)   Resp 16   Ht 5' 5.16" (1.655 m)   Wt 164 lb (74.4 kg)   SpO2 96%   BMI 27.16 kg/m  Physical Exam No results found. Depression screen [facility] 2/9 03/13/2018 12/21/2017 12/05/2017 09/05/2017 05/09/2017  Decreased Interest 0 0 0 0 0  Down, Depressed, Hopeless 0 0 0 0 0  PHQ - 2 Score 0 0 0 0 0    Fall Risk  03/13/2018 12/21/2017 12/05/2017 09/05/2017 05/09/2017  Falls in the past year? No No No No Yes  Number falls in past yr: - - - - 1  Injury with Fall? - - - - No        Assessment & Plan:   1. Type 2 diabetes mellitus with diabetic nephropathy, without long-term current use of insulin ([place])   2. Chronic diastolic heart failure ([place])   3. Anxiety state   4. Fibromyalgia   5. Pure hypercholesterolemia   6. Seasonal allergic rhinitis due to pollen   7. Irritable bowel syndrome with diarrhea   8. Gastroesophageal reflux disease without esophagitis     Diabetes mellitus moderately controlled: Patient has concerned that she will not be able to afford Trulicity and farxiga in September when transitions to a different insurance.  Agree with increasing metformin to 1000 mg twice daily.  Will likely need an additional agent to replace Trulicity or [place].  Hopefully, patient can continue Trulicity in September as she has benefited from weight loss.  Continue losartan therapy for microalbuminuria.  Hypercholesterolemia: Controlled.  Obtain labs.  No changes in therapy.  Anxiety and depression.:  Stable at this time.  No changes in Effexor therapy.  Allergic rhinitis uncontrolled: Refill of Flonase provided.  Fibromyalgia: Stable at this time.  Followed by Dr. Bobbye Morton of rheumatology.  IBS and GERD: Improved  with addition of colestipol per gastroenterology.  Orders Placed This Encounter  Procedures  . Comprehensive metabolic panel    Order Specific Question:   Has the patient fasted?    Answer:   No  . Lipid panel    Order Specific Question:   Has the patient fasted?    Answer:   No  . Hemoglobin A1c  . POCT glucose (manual entry)   Meds ordered this encounter  Medications  . fluticasone (FLONASE) 50 MCG/ACT nasal spray    Sig: Place 2 sprays into both nostrils daily.    Dispense:  16 g    Refill:  11  . metFORMIN (GLUCOPHAGE) 1000 MG tablet    Sig: Take 1  tablet (1,000 mg total) by mouth 2 (two) times daily with a meal.    Dispense:  180 tablet    Refill:  3  . hydrochlorothiazide (HYDRODIURIL) 25 MG tablet    Sig: Take 1 tablet (25 mg total) by mouth daily.    Dispense:  90 tablet    Refill:  3  . venlafaxine XR (EFFEXOR-XR) 75 MG 24 hr capsule    Sig: Take 1 capsule (75 mg total) by mouth every evening.    Dispense:  90 capsule    Refill:  3  . simvastatin (ZOCOR) 40 MG tablet    Sig: Take 1 tablet (40 mg total) by mouth at bedtime.    Dispense:  90 tablet    Refill:  3  . losartan (COZAAR) 50 MG tablet    Sig: Take 1 tablet (50 mg total) by mouth daily.    Dispense:  90 tablet    Refill:  3  . dexlansoprazole (DEXILANT) 60 MG capsule    Sig: Take 1 capsule (60 mg total) by mouth daily.    Dispense:  90 capsule    Refill:  3    Return in about 3 months (around 06/13/2018) for establish care with AMY BEDSOLE.   Norwood Levo, M.D. Primary Care at [facility] previously [facility] [address] [place], [place]  [address] [phone] phone [phone] fax

## 2018-03-13 NOTE — Patient Instructions (Addendum)
Dorothy [name] in [place], [place], Dorothy [name] [facility] in [place] --- Dorothy [name], Dorothy Dills, Dorothy [name]   Diabetes Mellitus and Exercise Exercising regularly is important for your overall health, especially when you have diabetes (diabetes mellitus). Exercising is not only about losing weight. It has many health benefits, such as increasing muscle strength and bone density and reducing body fat and stress. This leads to improved fitness, flexibility, and endurance, all of which result in better overall health. Exercise has additional benefits for people with diabetes, including:  Reducing appetite.  Helping to lower and control blood glucose.  Lowering blood pressure.  Helping to control amounts of fatty substances (lipids) in the blood, such as cholesterol and triglycerides.  Helping the body to respond better to insulin (improving insulin sensitivity).  Reducing how much insulin the body needs.  Decreasing the risk for heart disease by: ? Lowering cholesterol and triglyceride levels. ? Increasing the levels of good cholesterol. ? Lowering blood glucose levels.  What is my activity plan? Your health care provider or certified diabetes educator can help you make a plan for the type and frequency of exercise (activity plan) that works for you. Make sure that you:  Do at least 150 minutes of moderate-intensity or vigorous-intensity exercise each week. This could be brisk walking, biking, or water aerobics. ? Do stretching and strength exercises, such as yoga or weightlifting, at least 2 times a week. ? Spread out your activity over at least 3 days of the week.  Get some form of physical activity every day. ? Do not go more than 2 days in a row without some kind of physical activity. ? Avoid being inactive for more than 90 minutes at a time. Take frequent breaks to walk or stretch.  Choose a type of exercise or activity that you enjoy, and set  realistic goals.  Start slowly, and gradually increase the intensity of your exercise over time.  What do I need to know about managing my diabetes?  Check your blood glucose before and after exercising. ? If your blood glucose is higher than 240 mg/dL (13.3 mmol/L) before you exercise, check your urine for ketones. If you have ketones in your urine, do not exercise until your blood glucose returns to normal.  Know the symptoms of low blood glucose (hypoglycemia) and how to treat it. Your risk for hypoglycemia increases during and after exercise. Common symptoms of hypoglycemia can include: ? Hunger. ? Anxiety. ? Sweating and feeling clammy. ? Confusion. ? Dizziness or feeling light-headed. ? Increased heart rate or palpitations. ? Blurry vision. ? Tingling or numbness around the mouth, lips, or tongue. ? Tremors or shakes. ? Irritability.  Keep a rapid-acting carbohydrate snack available before, during, and after exercise to help prevent or treat hypoglycemia.  Avoid injecting insulin into areas of the body that are going to be exercised. For example, avoid injecting insulin into: ? The arms, when playing tennis. ? The legs, when jogging.  Keep records of your exercise habits. Doing this can help you and your health care provider adjust your diabetes management plan as needed. Write down: ? Food that you eat before and after you exercise. ? Blood glucose levels before and after you exercise. ? The type and amount of exercise you have done. ? When your insulin is expected to peak, if you use insulin. Avoid exercising at times when your insulin is peaking.  When you start a new exercise or activity, work with your health  care provider to make sure the activity is safe for you, and to adjust your insulin, medicines, or food intake as needed.  Drink plenty of water while you exercise to prevent dehydration or heat stroke. Drink enough fluid to keep your urine clear or pale  yellow. This information is not intended to replace advice given to you by your health care provider. Make sure you discuss any questions you have with your health care provider. Document Released: 11/04/2003 Document Revised: 03/03/2016 Document Reviewed: 01/24/2016 Elsevier Interactive Patient Education  2018 [employer].   IF you received an x-ray today, you will receive an invoice from [facility]. Please contact [facility] at [phone] with questions or concerns regarding your invoice.   IF you received labwork today, you will receive an invoice from [place]. Please contact [facility] at [phone] with questions or concerns regarding your invoice.   Our billing staff will not be able to assist you with questions regarding bills from these companies.  You will be contacted with the lab results as soon as they are available. The fastest way to get your results is to activate your My Chart account. Instructions are located on the last page of this paperwork. If you have not heard from [place] regarding the results in 2 weeks, please contact this office.

## 2018-03-14 DIAGNOSIS — E1121 Type 2 diabetes mellitus with diabetic nephropathy: Secondary | ICD-10-CM | POA: Insufficient documentation

## 2018-03-25 ENCOUNTER — Other Ambulatory Visit: Payer: Self-pay | Admitting: Rheumatology

## 2018-03-25 NOTE — Telephone Encounter (Signed)
Last Visit: 03/13/18 Next visit: 09/18/18  Okay to refill per Dr. Estanislado Pandy

## 2018-04-12 ENCOUNTER — Other Ambulatory Visit: Payer: Self-pay | Admitting: Gastroenterology

## 2018-04-21 ENCOUNTER — Other Ambulatory Visit: Payer: Self-pay | Admitting: Rheumatology

## 2018-04-22 NOTE — Telephone Encounter (Signed)
Last Visit: 03/13/18 Next visit: 09/18/18  Okay to refill per Dr. Estanislado Pandy

## 2018-05-18 ENCOUNTER — Other Ambulatory Visit: Payer: Self-pay | Admitting: Family Medicine

## 2018-05-20 NOTE — Telephone Encounter (Signed)
Attempted to call patient to schedule her with another provider at [facility] for refills of her farxiga. No directions or provider listed on  this medication in chart.   No answer and not able to leave message.

## 2018-05-21 ENCOUNTER — Other Ambulatory Visit: Payer: Self-pay | Admitting: Rheumatology

## 2018-05-21 ENCOUNTER — Other Ambulatory Visit: Payer: Self-pay | Admitting: Gastroenterology

## 2018-05-21 NOTE — Telephone Encounter (Signed)
Last Visit: 03/13/18 Next visit: 09/18/18  Okay to refill per Dr. Estanislado Pandy

## 2018-05-24 ENCOUNTER — Other Ambulatory Visit: Payer: Self-pay | Admitting: Physician Assistant

## 2018-05-24 MED ORDER — FARXIGA 10 MG PO TABS: 10.0000 mg | ORAL_TABLET | Freq: Every day | ORAL | 0 refills | Status: DC

## 2018-05-24 NOTE — Telephone Encounter (Signed)
Copied from [place] [id number]. Topic: Quick Communication - Rx Refill/Question >> May 24, 2018  9:31 AM Oliver Pila B wrote: Pt's insurance runs out monday  Medication: FARXIGA 10 MG TABS tablet [[id number]]   Has the patient contacted their pharmacy? Yes.   (Agent: If no, request that the patient contact the pharmacy for the refill.) (Agent: If yes, when and what did the pharmacy advise?)  Preferred Pharmacy (with phone number or street name): [name]  Agent: Please be advised that RX refills may take up to 3 business days. We ask that you follow-up with your pharmacy.

## 2018-05-24 NOTE — Telephone Encounter (Signed)
Previous pcp Dr Tamala Julian Last appt July Has appt with [facility] in Oct 30 days rx sent, no refills

## 2018-06-05 ENCOUNTER — Other Ambulatory Visit: Payer: Self-pay

## 2018-06-05 ENCOUNTER — Encounter: Payer: Self-pay | Admitting: Physician Assistant

## 2018-06-05 ENCOUNTER — Telehealth: Payer: Self-pay | Admitting: Physician Assistant

## 2018-06-05 ENCOUNTER — Ambulatory Visit (INDEPENDENT_AMBULATORY_CARE_PROVIDER_SITE_OTHER): Payer: BLUE CROSS/BLUE SHIELD | Admitting: Physician Assistant

## 2018-06-05 VITALS — BP 113/73 | HR 73 | Temp 98.2°F | Resp 18 | Ht 65.0 in | Wt 166.2 lb

## 2018-06-05 DIAGNOSIS — E1121 Type 2 diabetes mellitus with diabetic nephropathy: Secondary | ICD-10-CM

## 2018-06-05 DIAGNOSIS — Z23 Encounter for immunization: Secondary | ICD-10-CM | POA: Diagnosis not present

## 2018-06-05 LAB — POCT GLYCOSYLATED HEMOGLOBIN (HGB A1C): Hemoglobin A1C: 7.6 % — AB (ref 4.0–5.6)

## 2018-06-05 MED ORDER — METFORMIN HCL ER (MOD) 1000 MG PO TB24: 1000.0000 mg | ORAL_TABLET | Freq: Two times a day (BID) | ORAL | 1 refills | Status: DC

## 2018-06-05 NOTE — Telephone Encounter (Signed)
Copied from [place] [id number]. Topic: Quick Communication - Rx Refill/Question >> Jun 05, 2018  4:37 PM Waylan Rocher, Lumin L wrote: Medication: metFORMIN (GLUMETZA) 1000 MG (MOD) 24 hr tablet (Pharmacy stated the patient gets regular metformin 2x a day, the one sent is too expensive)  Has the patient contacted their pharmacy? Yes.   (Agent: If no, request that the patient contact the pharmacy for the refill.) (Agent: If yes, when and what did the pharmacy advise?)  Preferred Pharmacy (with phone number or street name): [name] [address], [place] - [address] [place] [address] Phone: [phone] Fax: [phone]  Agent: Please be advised that RX refills may take up to 3 business days. We ask that you follow-up with your pharmacy.

## 2018-06-05 NOTE — Patient Instructions (Addendum)
  Stop taking metformin immediate release (your current medication) Start taking metformin extended release 1,000mg  twice daily.  Please let me know if you experience side effects on this medication and I will adjust the dose.   Come back for your annual exam in 3 months.   If you have lab work done today you will be contacted with your lab results within the next 2 weeks.  If you have not heard from [place] then please contact us. The fastest way to get your results is to register for My Chart.   IF you received an x-ray today, you will receive an invoice from [facility]. Please contact [facility] at [phone] with questions or concerns regarding your invoice.   IF you received labwork today, you will receive an invoice from [facility]. Please contact [facility] at [phone] with questions or concerns regarding your invoice.   Our billing staff will not be able to assist you with questions regarding bills from these companies.  You will be contacted with the lab results as soon as they are available. The fastest way to get your results is to activate your My Chart account. Instructions are located on the last page of this paperwork. If you have not heard from [place] regarding the results in 2 weeks, please contact this office.

## 2018-06-05 NOTE — Progress Notes (Signed)
Dorothy [name]  MRN: [id number] DOB: Sep 27, 1955  PCP: Dorothy Hiss, PA-C  Subjective:  Pt is a 62 year old female who presents to clinic for establish care. She is a former pt of Dr. Tamala Julian. Last OV was 03/13/2018.  She is married with three children, has 5 grandchildren.  Works as a Pharmacologist at [employer].   Diabetes mellitus type 2: Uncontrolled at last visit. metformin to 1,000 mg twice daily and Trulicity  She has not been taking Metformin 1,016m once daily due to SE of diarrhea.  Trulicity 05.83mg once weekly Farxiga 10 mg daily.  Lab Results  Component Value Date   HGBA1C 7.5 (H) 03/13/2018   Hypercholesterolemia: Controlled  Microalbuminuria - losartan.   Fibromyalgia - sees Dr. DEstanislado Pandyof rheumatology 2x/yearly.   Anxiety: Well-controlled currently on Effexor XR [id number]  IBS and GERD: Improved with addition of colestipol per gastroenterology  Changed to blue cross blue shield a week ago and is unsure of medication coverage.   Pt  has a past medical history of Allergic rhinitis, cause unspecified, Allergy, Anxiety, B12 deficiency, Benign paroxysmal positional vertigo, Choledocholithiasis, Congestive heart failure ([place] Depression, Diabetes mellitus, Fatty liver, Fatty liver (09/04/12), Fibromyalgia, Gastritis, GERD (gastroesophageal reflux disease), Hematuria, unspecified, Hiatal hernia, adenomatous colonic polyps, cardiovascular stress test, echocardiogram, Hyperlipidemia, Meniere's disease, unspecified, Migraine, Mononeuritis of unspecified site, Osteoarthritis, Osteoporosis, unspecified, Otosclerosis, unspecified, Peptic ulcer, unspecified site, unspecified as acute or chronic, without mention of hemorrhage, perforation, or obstruction, Symptomatic menopausal or female climacteric states, and Unspecified disorder of kidney and ureter.  Review of Systems  Cardiovascular: Negative for chest pain, palpitations and leg swelling.  Endocrine:  Negative for polydipsia, polyphagia and polyuria.  Psychiatric/Behavioral: Negative for dysphoric mood. The patient is not nervous/anxious.     Patient Active Problem List   Diagnosis Date Noted  . Type 2 diabetes mellitus with diabetic nephropathy, without long-term current use of insulin (HCNimrod07/18/2019  . Seasonal allergic rhinitis due to pollen 05/11/2017  . Degenerative disc disease, cervical 09/20/2016  . Osteoporosis 06/19/2016  . Other secondary osteoarthritis of both hands 04/15/2016  . Osteoarthritis of pelvis 04/15/2016  . DDD (degenerative disc disease), lumbar 04/15/2016  . Neuropathy 04/15/2016  . IBS (irritable bowel syndrome) 04/15/2016  . Chronic diastolic heart failure (HCRoosevelt Park10/17/2016  . Premature ventricular contractions 12/10/2013  . Fibromyalgia 10/03/2012  . COLONIC POLYPS, ADENOMATOUS 09/21/2007  . HLD (hyperlipidemia) 09/21/2007  . Anxiety state 09/21/2007  . GERD 09/21/2007  . HIATAL HERNIA 09/21/2007  . CHOLEDOCHOLITHIASIS, HX OF 09/21/2007    Current Outpatient Medications on File Prior to Visit  Medication Sig Dispense Refill  . aspirin 81 MG tablet Take 81 mg by mouth every evening.    . blood glucose meter kit and supplies Based on nsurance preference. Check glucose once a day. E11.65 1 each 11  . Calcium Carb-Cholecalciferol (CALCIUM 500 +D) 500-400 MG-UNIT TABS Take 1 tablet by mouth daily.     . colestipol (COLESTID) 1 g tablet TAKE ONE TABLET BY MOUTH TWICE A DAY 60 tablet 0  . dexlansoprazole (DEXILANT) 60 MG capsule Take 1 capsule (60 mg total) by mouth daily. 90 capsule 3  . diclofenac sodium (VOLTAREN) 1 % GEL Apply 3 grams to 3 large joints up to 3 times daily as needed. 3 Tube 3  . Dulaglutide (TRULICITY) 0.0.94GMH/6.8GSOPN Inject 0.75 mg into the skin once a week. 5 pen 5  . FARXIGA 10 MG TABS tablet Take 10 mg by  mouth daily. 30 tablet 0  . fluticasone (FLONASE) 50 MCG/ACT nasal spray Place 2 sprays into both nostrils daily. 16 g 11  .  gabapentin (NEURONTIN) 300 MG capsule TAKE ONE CAPSULE BY MOUTH THREE TIMES A DAY 90 capsule 0  . hydrochlorothiazide (HYDRODIURIL) 25 MG tablet Take 1 tablet (25 mg total) by mouth daily. 90 tablet 3  . losartan (COZAAR) 50 MG tablet Take 1 tablet (50 mg total) by mouth daily. 90 tablet 3  . metaxalone (SKELAXIN) 800 MG tablet Take 1 tablet (800 mg total) by mouth daily. 30 tablet 2  . metFORMIN (GLUCOPHAGE) 1000 MG tablet Take 1 tablet (1,000 mg total) by mouth 2 (two) times daily with a meal. 180 tablet 3  . Misc Natural Products (DANDELION ROOT) 520 MG CAPS Take by mouth 2 (two) times daily after a meal.    . omega-3 acid ethyl esters (LOVAZA) 1 g capsule TAKE ONE CAPSULE BY MOUTH TWICE A DAY 60 capsule 8  . Potassium Chloride ER 20 MEQ TBCR TAKE TWO TABLETS BY MOUTH DAILY 60 tablet 9  . pramipexole (MIRAPEX) 0.125 MG tablet Take 3 tablets (0.375 mg total) by mouth at bedtime. 270 tablet 3  . simvastatin (ZOCOR) 40 MG tablet Take 1 tablet (40 mg total) by mouth at bedtime. 90 tablet 3  . venlafaxine XR (EFFEXOR-XR) 75 MG 24 hr capsule Take 1 capsule (75 mg total) by mouth every evening. 90 capsule 3  . meloxicam (MOBIC) 7.5 MG tablet Take 1-2 tablets (7.5-15 mg total) by mouth daily. (Patient not taking: Reported on 06/05/2018) 30 tablet 0   No current facility-administered medications on file prior to visit.     Allergies  Allergen Reactions  . Bactrim [Sulfamethoxazole-Trimethoprim] Other (See Comments)    blisters  . Keflex [Cephalexin] Hives  . Macrobid [Nitrofurantoin Macrocrystal] Other (See Comments)    blisters  . Nitrofurantoin Nausea And Vomiting and Other (See Comments)    Blisters   . Sulfa Drugs Cross Reactors Itching  . Adhesive [Tape] Other (See Comments)    Tears skins  . Codeine Itching  . Talwin [Pentazocine] Nausea And Vomiting and Rash    Muscle cramps and vision changes     Objective:  BP 113/73   Pulse 73   Temp 98.2 F (36.8 C) (Oral)   Resp 18   Ht  _0  (1.651 m)   Wt 166 lb 3.2 oz (75.4 kg)   SpO2 98%   BMI 27.66 kg/m   Physical Exam  Constitutional: She appears well-developed and well-nourished.  Cardiovascular: Normal rate and regular rhythm.  Skin: Skin is warm and dry.  Psychiatric: She has a normal mood and affect. Her behavior is normal. Judgment and thought content normal.  Vitals reviewed.   Assessment and Plan :  1. Type 2 diabetes mellitus with diabetic nephropathy, without long-term current use of insulin (HCC) - Pt presents to establish care. She is a former pt of Dr. Tamala Julian. Last OV was 03/13/2018. She has not been taking Metformin due to SE of diarrhea. She is asymptomatic today. Plan to start Metformin XR 1,020m bid. RTC in 3 months for recheck and annual exam.  - POCT glycosylated hemoglobin (Hb A1C) - CMP14+EGFR  2. Flu vaccine need - Administered by CMA.  - Flu Vaccine QUAD 36+ mos IM   WMercer Pod PA-C  [facility] at [place]/04/2018 11:25 AM  Please note: Portions of this report may have been transcribed using dragon voice recognition  software. Every effort was made to ensure accuracy; however, inadvertent computerized transcription errors may be present.

## 2018-06-06 LAB — CMP14+EGFR
ALT: 31 IU/L (ref 0–32)
AST: 22 IU/L (ref 0–40)
Albumin/Globulin Ratio: 2.4 — ABNORMAL HIGH (ref 1.2–2.2)
Albumin: 4.8 g/dL (ref 3.6–4.8)
Alkaline Phosphatase: 96 IU/L (ref 39–117)
BUN/Creatinine Ratio: 27 (ref 12–28)
BUN: 25 mg/dL (ref 8–27)
Bilirubin Total: 0.5 mg/dL (ref 0.0–1.2)
CO2: 22 mmol/L (ref 20–29)
Calcium: 9.8 mg/dL (ref 8.7–10.3)
Chloride: 99 mmol/L (ref 96–106)
Creatinine, Ser: 0.93 mg/dL (ref 0.57–1.00)
GFR calc Af Amer: 77 mL/min/{1.73_m2} (ref >59)
GFR calc non Af Amer: 67 mL/min/{1.73_m2} (ref >59)
Globulin, Total: 2 g/dL (ref 1.5–4.5)
Glucose: 162 mg/dL — ABNORMAL HIGH (ref 65–99)
Potassium: 3.6 mmol/L (ref 3.5–5.2)
Sodium: 139 mmol/L (ref 134–144)
Total Protein: 6.8 g/dL (ref 6.0–8.5)

## 2018-06-06 NOTE — Telephone Encounter (Signed)
Message being sent to [facility] to advise re: Metformin Glumetza 1000 mg is too expensive.

## 2018-06-06 NOTE — Telephone Encounter (Signed)
Spoke to pharmacist.  I confirmed you did put pt on extended release med. Pharmacist states you can put her on Gluphage XR 500mg  - 2 pills twice daily and this will be a cheaper option.  Message to [employer]

## 2018-06-06 NOTE — Telephone Encounter (Signed)
Pharmacy states pt has been getting  metFORMIN (GLUCOPHAGE) 1000 MG tablet And not extended release..just regular  Please call and clarify if ok for what she has been taking.  [place], [place] - [address] [phone] (Phone) [phone] (Fax)   Pt is waiting to get this

## 2018-06-07 NOTE — Telephone Encounter (Signed)
Copied from [place] [id number]. Topic: Quick Communication - Rx Refill/Question >> Jun 05, 2018  4:37 PM Waylan Rocher, Lumin L wrote: Medication: metFORMIN (GLUMETZA) 1000 MG (MOD) 24 hr tablet (Pharmacy stated the patient gets regular metformin 2x a day, the one sent is too expensive)  Has the patient contacted their pharmacy? Yes.   (Agent: If no, request that the patient contact the pharmacy for the refill.) (Agent: If yes, when and what did the pharmacy advise?)  Preferred Pharmacy (with phone number or street name): [name] [address], [place] - [address] [place] [address] Phone: [phone] Fax: [phone]  Agent: Please be advised that RX refills may take up to 3 business days. We ask that you follow-up with your pharmacy. >> Jun 07, 2018 11:38 AM Carolyn Stare wrote:   Pharmacy call to ask if they can change to  500mg  take 2 tablets twice a day

## 2018-06-11 ENCOUNTER — Other Ambulatory Visit: Payer: Self-pay | Admitting: Physician Assistant

## 2018-06-11 DIAGNOSIS — E119 Type 2 diabetes mellitus without complications: Secondary | ICD-10-CM

## 2018-06-11 MED ORDER — METFORMIN HCL ER 500 MG PO TB24: 1000.0000 mg | ORAL_TABLET | Freq: Two times a day (BID) | ORAL | 2 refills | Status: DC

## 2018-06-11 NOTE — Telephone Encounter (Signed)
Glucophage XR 500mg  - 2 pills twice daily sent to pharmacy

## 2018-06-17 ENCOUNTER — Other Ambulatory Visit: Payer: Self-pay | Admitting: Rheumatology

## 2018-06-17 NOTE — Telephone Encounter (Signed)
Last Visit: 03/13/18 Next visit: 09/18/18  Okay to refill per Dr. Estanislado Pandy

## 2018-06-19 ENCOUNTER — Ambulatory Visit: Payer: BLUE CROSS/BLUE SHIELD | Admitting: Physician Assistant

## 2018-06-19 ENCOUNTER — Encounter: Payer: Self-pay | Admitting: Physician Assistant

## 2018-06-19 ENCOUNTER — Other Ambulatory Visit: Payer: Self-pay

## 2018-06-19 VITALS — BP 132/79 | HR 87 | Temp 99.1°F | Resp 18 | Ht 65.43 in | Wt 168.0 lb

## 2018-06-19 DIAGNOSIS — W57XXXA Bitten or stung by nonvenomous insect and other nonvenomous arthropods, initial encounter: Secondary | ICD-10-CM | POA: Diagnosis not present

## 2018-06-19 DIAGNOSIS — L03119 Cellulitis of unspecified part of limb: Secondary | ICD-10-CM

## 2018-06-19 MED ORDER — TRIAMCINOLONE ACETONIDE 0.1 % EX CREA: 1.0000 "application " | TOPICAL_CREAM | Freq: Two times a day (BID) | CUTANEOUS | 0 refills | Status: AC

## 2018-06-19 MED ORDER — CETIRIZINE HCL 10 MG PO TABS: 10.0000 mg | ORAL_TABLET | Freq: Every day | ORAL | 0 refills | Status: AC

## 2018-06-19 MED ORDER — HYDROXYZINE HCL 25 MG PO TABS: 12.5000 mg | ORAL_TABLET | Freq: Three times a day (TID) | ORAL | 0 refills | Status: AC | PRN

## 2018-06-19 MED ORDER — DOXYCYCLINE HYCLATE 100 MG PO CAPS: 100.0000 mg | ORAL_CAPSULE | Freq: Two times a day (BID) | ORAL | 0 refills | Status: AC

## 2018-06-19 NOTE — Progress Notes (Signed)
Dorothy [name]  MRN: [id number] DOB: 1956/03/24  Subjective:  Dorothy [name] is a 62 y.o. female seen in office today for a chief complaint of rash involving right and left upper extremity. Rash started 3 weeks ago on right arm and a few days ago on left arm. Lesions look like bites with overlying hives. They are extremely pruritic. Denies pain. Feels like the surrounding redness is worsening.  Associated symptoms: none. Patient denies: abdominal pain, arthralgia, congestion, cough, crankiness, decrease in appetite, decrease in energy level, fever, headache, irritability, myalgia, nausea, sore throat and vomiting. Patient has not had contacts with similar rash. 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  Neighbor does have dogs.  She typically sits out on the porch before going to bed, which is also new for her. Denies new exposures to soaps, lotions, laundry detergents, foods, medications, plants, or animals. No recent travel. Denies smoking. Has PMH of controlled T2DM. No PMH of MRSA.   Review of Systems Per HPI Patient Active Problem List   Diagnosis Date Noted  . Type 2 diabetes mellitus with diabetic nephropathy, without long-term current use of insulin ([place]) 03/14/2018  . Seasonal allergic rhinitis due to pollen 05/11/2017  . Degenerative disc disease, cervical 09/20/2016  . Osteoporosis 06/19/2016  . Other secondary osteoarthritis of both hands 04/15/2016  . Osteoarthritis of pelvis 04/15/2016  . DDD (degenerative disc disease), lumbar 04/15/2016  . Neuropathy 04/15/2016  . IBS (irritable bowel syndrome) 04/15/2016  . Chronic diastolic heart failure ([place]) 06/14/2015  . Premature ventricular contractions 12/10/2013  . Fibromyalgia 10/03/2012  . COLONIC POLYPS, ADENOMATOUS 09/21/2007  . HLD (hyperlipidemia) 09/21/2007  . Anxiety state 09/21/2007  . GERD 09/21/2007  . HIATAL HERNIA 09/21/2007  .  CHOLEDOCHOLITHIASIS, HX OF 09/21/2007    Current Outpatient Medications on File Prior to Visit  Medication Sig Dispense Refill  . aspirin 81 MG tablet Take 81 mg by mouth every evening.    . blood glucose meter kit and supplies Based on nsurance preference. Check glucose once a day. E11.65 1 each 11  . Calcium Carb-Cholecalciferol (CALCIUM 500 +D) 500-400 MG-UNIT TABS Take 1 tablet by mouth daily.     . colestipol (COLESTID) 1 g tablet TAKE ONE TABLET BY MOUTH TWICE A DAY 60 tablet 0  . dexlansoprazole (DEXILANT) 60 MG capsule Take 1 capsule (60 mg total) by mouth daily. 90 capsule 3  . diclofenac sodium (VOLTAREN) 1 % GEL Apply 3 grams to 3 large joints up to 3 times daily as needed. 3 Tube 3  . Dulaglutide (TRULICITY) 8.84 ZY/6.0YT SOPN Inject 0.75 mg into the skin once a week. 5 pen 5  . fluticasone (FLONASE) 50 MCG/ACT nasal spray Place 2 sprays into both nostrils daily. 16 g 11  . gabapentin (NEURONTIN) 300 MG capsule TAKE ONE CAPSULE BY MOUTH THREE TIMES A DAY 90 capsule 0  . hydrochlorothiazide (HYDRODIURIL) 25 MG tablet Take 1 tablet (25 mg total) by mouth daily. 90 tablet 3  . losartan (COZAAR) 50 MG tablet Take 1 tablet (50 mg total) by mouth daily. 90 tablet 3  . metaxalone (SKELAXIN) 800 MG tablet Take 1 tablet (800 mg total) by mouth daily. 30 tablet 2  . metFORMIN (GLUCOPHAGE XR) 500 MG 24 hr tablet Take 2 tablets (1,000 mg total) by mouth 2 (two) times daily with a meal. 180 tablet 2  . Misc Natural Products (DANDELION ROOT) 520  MG CAPS Take by mouth 2 (two) times daily after a meal.    . omega-3 acid ethyl esters (LOVAZA) 1 g capsule TAKE ONE CAPSULE BY MOUTH TWICE A DAY 60 capsule 8  . Potassium Chloride ER 20 MEQ TBCR TAKE TWO TABLETS BY MOUTH DAILY 60 tablet 9  . pramipexole (MIRAPEX) 0.125 MG tablet Take 3 tablets (0.375 mg total) by mouth at bedtime. 270 tablet 3  . simvastatin (ZOCOR) 40 MG tablet Take 1 tablet (40 mg total) by mouth at bedtime. 90 tablet 3  . venlafaxine  XR (EFFEXOR-XR) 75 MG 24 hr capsule Take 1 capsule (75 mg total) by mouth every evening. 90 capsule 3  . meloxicam (MOBIC) 7.5 MG tablet Take 1-2 tablets (7.5-15 mg total) by mouth daily. (Patient not taking: Reported on 06/05/2018) 30 tablet 0   No current facility-administered medications on file prior to visit.     Allergies  Allergen Reactions  . Bactrim [Sulfamethoxazole-Trimethoprim] Other (See Comments)    blisters  . Keflex [Cephalexin] Hives  . Macrobid [Nitrofurantoin Macrocrystal] Other (See Comments)    blisters  . Nitrofurantoin Nausea And Vomiting and Other (See Comments)    Blisters   . Sulfa Drugs Cross Reactors Itching  . Adhesive [Tape] Other (See Comments)    Tears skins  . Codeine Itching  . Talwin [Pentazocine] Nausea And Vomiting and Rash    Muscle cramps and vision changes     Objective:  BP 132/79   Pulse 87   Temp 99.1 F (37.3 C) (Oral)   Resp 18   Ht 5' 5.43" (1.662 m)   Wt 168 lb (76.2 kg)   SpO2 97%   BMI 27.59 kg/m   Physical Exam  Constitutional: She is oriented to person, place, and time. She appears well-developed and well-nourished.  HENT:  Head: Normocephalic and atraumatic.  Eyes: Conjunctivae are normal.  Neck: Normal range of motion.  Pulmonary/Chest: Effort normal.  Neurological: She is alert and oriented to person, place, and time.  Skin: Skin is warm and dry.  Few wheals with a central, hemorrhagic punctum noted on BUE. Surrounding erythema and warmth extends well beyond punctum site on LUE and moderately beyond punctum site on RUE. No palpated fluctuance. No purulent drainage noted. No other areas affected. Sensation intact. See image below.   Psychiatric: She has a normal mood and affect.  Vitals reviewed.              Assessment and Plan :  1. Bug bite, initial encounter History and physical exam findings consistent with bug bite.  She only notices new bites when she stays the night her neighbor's home.  Suspect  either bedbugs, flea bites, or mosquito bites.  They are only present on the upper extremities.  Recommend topical steroid cream, oral antihistamine during the day and at nighttime for itching.  Given treatment measures if bedbugs.  Concern for surrounding infection due to large involvement of erythema and warmth.  Patient is allergic to cephalosporins and Bactrim.  Does not recall she is ever had penicillins.  Will treat with doxycycline with close follow-up in 48 hours. Return to clinic if symptoms worsen, do not improve, or as needed.  - triamcinolone cream (KENALOG) 0.1 %; Apply 1 application topically 2 (two) times daily.  Dispense: 30 g; Refill: 0 - cetirizine (ZYRTEC) 10 MG tablet; Take 1 tablet (10 mg total) by mouth daily.  Dispense: 30 tablet; Refill: 0 - hydrOXYzine (ATARAX/VISTARIL) 25 MG tablet; Take 0.5-1 tablets (12.5-25 mg  total) by mouth every 8 (eight) hours as needed.  Dispense: 30 tablet; Refill: 0  2. Cellulitis of upper extremity, unspecified laterality - doxycycline (VIBRAMYCIN) 100 MG capsule; Take 1 capsule (100 mg total) by mouth 2 (two) times daily.  Dispense: 20 capsule; Refill: 0   Tenna Delaine PA-C  Primary [facility] at [place] 06/25/2018 10:19 AM

## 2018-06-19 NOTE — Patient Instructions (Addendum)
Start applying steroid cream to affected area 2 times a day.  Use zyrtec in the morning, benedryl or hydroxyzine at night.  Start doxycycline.  If symptoms worsen and spread, please return immediately. I would like to evaluate your symptoms in 48 hours.  Thank you for letting me participate in your health and well being.     If you have lab work done today you will be contacted with your lab results within the next 2 weeks.  If you have not heard from [place] then please contact us. The fastest way to get your results is to register for My Chart.   Bedbugs Bedbugs are tiny bugs that live in and around beds. They stay hidden during the day, and they come out at night and bite. Bedbugs need blood to live and grow. Where are bedbugs found? Bedbugs can be found anywhere, whether a place is clean or dirty. They are most often found in places where many people come and go, such as hotels, shelters, dorms, and health care settings. It is also common for them to be found in homes where there are many birds or bats nearby. What are bedbug bites like? A bedbug bite leaves a small red bump with a darker red dot in the middle. The bump may appear soon after a person is bitten or a day or more later. Bedbug bites usually do not hurt, but they may itch. Most people do not need treatment for bedbug bites. The bumps usually go away on their own in a few days. How do I check for bedbugs? Bedbugs are reddish-brown, oval, and flat. They range in size from 1 mm to 7 mm and they cannot fly. Look for bedbugs in these places:  On mattresses, bed frames, headboards, and box springs.  On drapes and curtains in bedrooms.  Under carpeting in bedrooms.  Behind electrical outlets.  Behind any wallpaper that is peeling.  Inside luggage.  Also look for black or red spots or stains on or near the bed. Stains can come from bedbugs that have been crushed or from bedbug waste. What should I do if I find bedbugs? When  Traveling If you find bedbugs while traveling, check all of your possessions carefully before you bring them into your home. Consider throwing away anything that has bedbugs on it. At Home If you find bedbugs at home, your bedroom may need to be treated by a pest control expert. You may also need to throw away mattresses or luggage. To help keep bedbugs from coming back, consider taking these actions:  Put a plastic cover over your mattress.  Wash your clothes and bedding in water that is hotter than 120F (48.9C) and dry them on a hot setting. Bedbugs are killed by high temperatures.  Vacuum often around the bed and in all of the cracks and crevices where the bugs might hide.  Carefully check all used furniture, bedding, or clothes that you bring into your home.  Eliminate bird nests and bat roosts that are near your home.  In Your Bed If you find bedbugs in your bed, consider wearing pajamas that have long sleeves and pant legs. Bedbugs usually bite areas of the skin that are not covered. This information is not intended to replace advice given to you by your health care provider. Make sure you discuss any questions you have with your health care provider. Document Released: 09/16/2010 Document Revised: 01/20/2016 Document Reviewed: 08/10/2014 Elsevier Interactive Patient Education  [employer].  IF you received an x-ray today, you will receive an invoice from [facility]. Please contact [facility] at [phone] with questions or concerns regarding your invoice.   IF you received labwork today, you will receive an invoice from [facility]. Please contact [facility] at [phone] with questions or concerns regarding your invoice.   Our billing staff will not be able to assist you with questions regarding bills from these companies.  You will be contacted with the lab results as soon as they are available. The fastest way to get your results is to activate your  My Chart account. Instructions are located on the last page of this paperwork. If you have not heard from [place] regarding the results in 2 weeks, please contact this office.

## 2018-06-20 ENCOUNTER — Other Ambulatory Visit: Payer: Self-pay | Admitting: Family Medicine

## 2018-06-21 ENCOUNTER — Other Ambulatory Visit: Payer: Self-pay | Admitting: Family Medicine

## 2018-06-21 NOTE — Telephone Encounter (Signed)
Requested Prescriptions  Pending Prescriptions Disp Refills  . FARXIGA 10 MG TABS tablet [Pharmacy Med Name: FARXIGA 10 MG TABLET] 90 tablet 0    Sig: TAKE ONE TABLET BY MOUTH DAILY     Endocrinology:  Diabetes - SGLT2 Inhibitors Passed - 06/21/2018  6:10 AM      Passed - Cr in normal range and within 360 days    Creat  Date Value Ref Range Status  07/26/2016 0.98 0.50 - 1.05 mg/dL Final    Comment:      For patients > or = 61 years of age: The upper reference limit for Creatinine is approximately 13% higher for people identified as African-American.      Creatinine, Ser  Date Value Ref Range Status  06/05/2018 0.93 0.57 - 1.00 mg/dL Final         Passed - LDL in normal range and within 360 days    LDL Calculated  Date Value Ref Range Status  03/13/2018 94 0 - 99 mg/dL Final         Passed - HBA1C is between 0 and 7.9 and within 180 days    Hemoglobin A1C  Date Value Ref Range Status  06/05/2018 7.6 (A) 4.0 - 5.6 % Final   Hgb A1c MFr Bld  Date Value Ref Range Status  03/13/2018 7.5 (H) 4.8 - 5.6 % Final    Comment:             Prediabetes: 5.7 - 6.4          Diabetes: >6.4          Glycemic control for adults with diabetes: <7.0          Passed - eGFR in normal range and within 360 days    GFR, Est African American  Date Value Ref Range Status  04/15/2014 63 mL/min Final   GFR calc Af Amer  Date Value Ref Range Status  06/05/2018 77 >59 mL/min/1.73 Final   GFR, Est Non African American  Date Value Ref Range Status  04/15/2014 55 (L) mL/min Final    Comment:      The estimated GFR is a calculation valid for adults (>=69 years old) that uses the CKD-EPI algorithm to adjust for age and sex. It is   not to be used for children, pregnant women, hospitalized patients,    patients on dialysis, or with rapidly changing kidney function. According to the NKDEP, eGFR >89 is normal, 60-89 shows mild impairment, 30-59 shows moderate impairment, 15-29 shows  severe impairment and <15 is ESRD.     GFR calc non Af Amer  Date Value Ref Range Status  06/05/2018 67 >59 mL/min/1.73 Final   GFR  Date Value Ref Range Status  08/14/2014 56.63 (L) >60.00 mL/min Final         Passed - Valid encounter within last 6 months    Recent Outpatient Visits          2 days ago    Primary Care at [facility], [place] D, PA-C   2 weeks ago Type 2 diabetes mellitus with diabetic nephropathy, without long-term current use of insulin [facility])   Primary Care at [facility], Gelene Mink, PA-C   3 months ago Type 2 diabetes mellitus with diabetic nephropathy, without long-term current use of insulin [facility])   Primary Care at [facility], Renette Butters, MD   6 months ago Acute right-sided low back pain with right-sided sciatica   Primary Care at [facility], [place]  D, PA-C   6 months ago Type 2 diabetes mellitus with diabetic nephropathy, without long-term current use of insulin [facility])   Primary Care at [facility], Renette Butters, MD      Future Appointments            In 2 months Forrest Moron, MD Primary Care at [place], [place]   In 2 months Su Monks [facility] Rheumatology

## 2018-06-24 ENCOUNTER — Telehealth: Payer: Self-pay | Admitting: Family Medicine

## 2018-06-24 NOTE — Telephone Encounter (Signed)
Dorothy [name], This pt needs a PA for her Wilder Glade. I started one and it will need your signature. There are also additional questions.   Please advise.  Thank you!  KEY: [id number]

## 2018-06-24 NOTE — Telephone Encounter (Signed)
Dr. Myrtie Hawk, I have placed a prior auth for Dexilant 60MG  for this pt. It was originally prescribed by Dr. Tamala Julian in July it looks like. Almyra Free, I tried to run it through but it needs your signature.   Dr. Nolon Rod - I copied you since you were covering Dr. Thompson Caul refills.  Please advise.   Thank you!

## 2018-06-25 ENCOUNTER — Encounter: Payer: Self-pay | Admitting: Physician Assistant

## 2018-06-25 NOTE — Telephone Encounter (Signed)
Signed.

## 2018-06-28 ENCOUNTER — Telehealth: Payer: Self-pay | Admitting: Family Medicine

## 2018-06-28 NOTE — Telephone Encounter (Signed)
Patient was denied her medication Dexilant by her insurance company. The alternative medications to try are Lansoprazole, Esomeprazole, Pantoprazole. Denial letter is placed in the providers box.

## 2018-07-01 NOTE — Telephone Encounter (Signed)
Copied from [place] [id number]. Topic: Quick Communication - Rx Refill/Question >> Jul 01, 2018  2:34 PM Scherrie Gerlach wrote: Medication: FARXIGA 10 MG TABS tablet//and dexlansoprazole (DEXILANT) 60 MG capsule Pt has changed insurance companies and they do not want to cover these meds. However, they will cover Jardiance and Nexium to replace these 2. Pt requesting new Rx be sent to [facility] [address] [place] [phone] (Phone) [phone] (Fax)

## 2018-07-08 NOTE — Telephone Encounter (Signed)
Patient was denied [place] by [employer]. Denial letter placed in providers box.

## 2018-08-26 ENCOUNTER — Telehealth: Payer: Self-pay | Admitting: Physician Assistant

## 2018-08-26 NOTE — Telephone Encounter (Signed)
Current dose Losartan 50 mg is on back order. Pharmacy called and says to send in a new Rx for Losartan 25 mg BID.  Pharmacy: [facility] [address], [place] [phone] (Phone) [phone] (Fax)

## 2018-08-26 NOTE — Telephone Encounter (Signed)
Copied from [place] [id number]. Topic: Quick Communication - See Telephone Encounter >> Aug 26, 2018 10:54 AM Blase Mess A wrote: CRM for notification. See Telephone encounter for: 08/26/18. Argent from [name] is calling regarding losartan (COZAAR) 50 MG tablet [[id number]] Requesting 25mg  2x times a day please advise [facility] [address] [place] [phone] (Phone) [phone] (Fax)

## 2018-09-07 ENCOUNTER — Other Ambulatory Visit: Payer: Self-pay | Admitting: Physician Assistant

## 2018-09-07 DIAGNOSIS — E119 Type 2 diabetes mellitus without complications: Secondary | ICD-10-CM

## 2018-09-09 NOTE — Telephone Encounter (Signed)
Requested Prescriptions  Pending Prescriptions Disp Refills  . metFORMIN (GLUCOPHAGE-XR) 500 MG 24 hr tablet [Pharmacy Med Name: metFORMIN HCL XR 500 MG TABLET] 360 tablet 0    Sig: TAKE TWO TABLETS BY MOUTH TWICE A DAY WITH A MEAL     Endocrinology:  Diabetes - Biguanides Passed - 09/07/2018  9:00 AM      Passed - Cr in normal range and within 360 days    Creat  Date Value Ref Range Status  07/26/2016 0.98 0.50 - 1.05 mg/dL Final    Comment:      For patients > or = 62 years of age: The upper reference limit for Creatinine is approximately 13% higher for people identified as African-American.      Creatinine, Ser  Date Value Ref Range Status  06/05/2018 0.93 0.57 - 1.00 mg/dL Final         Passed - HBA1C is between 0 and 7.9 and within 180 days    Hemoglobin A1C  Date Value Ref Range Status  06/05/2018 7.6 (A) 4.0 - 5.6 % Final   Hgb A1c MFr Bld  Date Value Ref Range Status  03/13/2018 7.5 (H) 4.8 - 5.6 % Final    Comment:             Prediabetes: 5.7 - 6.4          Diabetes: >6.4          Glycemic control for adults with diabetes: <7.0          Passed - eGFR in normal range and within 360 days    GFR, Est African American  Date Value Ref Range Status  04/15/2014 63 mL/min Final   GFR calc Af Amer  Date Value Ref Range Status  06/05/2018 77 >59 mL/min/1.73 Final   GFR, Est Non African American  Date Value Ref Range Status  04/15/2014 55 (L) mL/min Final    Comment:      The estimated GFR is a calculation valid for adults (>=25 years old) that uses the CKD-EPI algorithm to adjust for age and sex. It is   not to be used for children, pregnant women, hospitalized patients,    patients on dialysis, or with rapidly changing kidney function. According to the NKDEP, eGFR >89 is normal, 60-89 shows mild impairment, 30-59 shows moderate impairment, 15-29 shows severe impairment and <15 is ESRD.     GFR calc non Af Amer  Date Value Ref Range Status  06/05/2018 67  >59 mL/min/1.73 Final   GFR  Date Value Ref Range Status  08/14/2014 56.63 (L) >60.00 mL/min Final         Passed - Valid encounter within last 6 months    Recent Outpatient Visits          2 months ago Bug bite, initial encounter   [facility], [place] D, PA-C   3 months ago Type 2 diabetes mellitus with diabetic nephropathy, without long-term current use of insulin [facility])   [facility] at [facility], Gelene Mink, PA-C   6 months ago Type 2 diabetes mellitus with diabetic nephropathy, without long-term current use of insulin [facility])   Primary Care at [facility], Renette Butters, MD   8 months ago Acute right-sided low back pain with right-sided sciatica   Primary [facility] at [facility], [place] D, PA-C   9 months ago Type 2 diabetes mellitus with diabetic nephropathy, without long-term current use of insulin ([place])   Primary Care at  Angus Seller, MD

## 2018-09-16 ENCOUNTER — Encounter: Payer: BLUE CROSS/BLUE SHIELD | Admitting: Family Medicine

## 2018-09-17 ENCOUNTER — Ambulatory Visit: Payer: 59 | Admitting: Neurology

## 2018-09-17 ENCOUNTER — Other Ambulatory Visit: Payer: Self-pay | Admitting: Physician Assistant

## 2018-09-17 DIAGNOSIS — Z1231 Encounter for screening mammogram for malignant neoplasm of breast: Secondary | ICD-10-CM

## 2018-09-18 ENCOUNTER — Ambulatory Visit: Payer: 59 | Admitting: Physician Assistant

## 2018-10-12 NOTE — Telephone Encounter (Signed)
Please have this patient follow up with [place] in March or April for Diabetes check preferrably with Dr. Pamella Pert or myself.

## 2018-10-14 ENCOUNTER — Ambulatory Visit: Payer: BLUE CROSS/BLUE SHIELD

## 2018-10-16 ENCOUNTER — Telehealth: Payer: Self-pay

## 2018-11-15 NOTE — Telephone Encounter (Signed)
No longer [facility] Pt due to insurance

## 2018-11-21 IMAGING — MG 2D DIGITAL SCREENING BILATERAL MAMMOGRAM WITH IMPLANTS, CAD AND
8 of 16 series · 8 of 32 positions shown · non-contrast
Comparison: Previous exam(s).

CLINICAL DATA: Screening. Bilateral implant augmentation in 6777.

EXAM:
2D DIGITAL SCREENING BILATERAL MAMMOGRAM WITH IMPLANTS, CAD AND
ADJUNCT TOMO
The patient has retropectoral implants. Standard and implant
displaced views were performed.

[R CC]
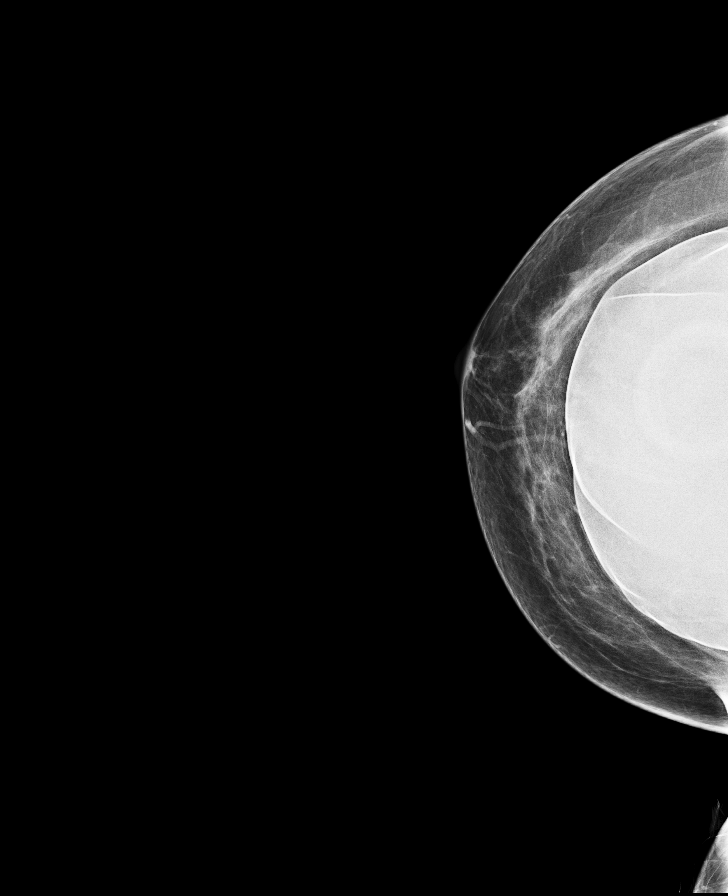

[L MLO (1 of 2)]
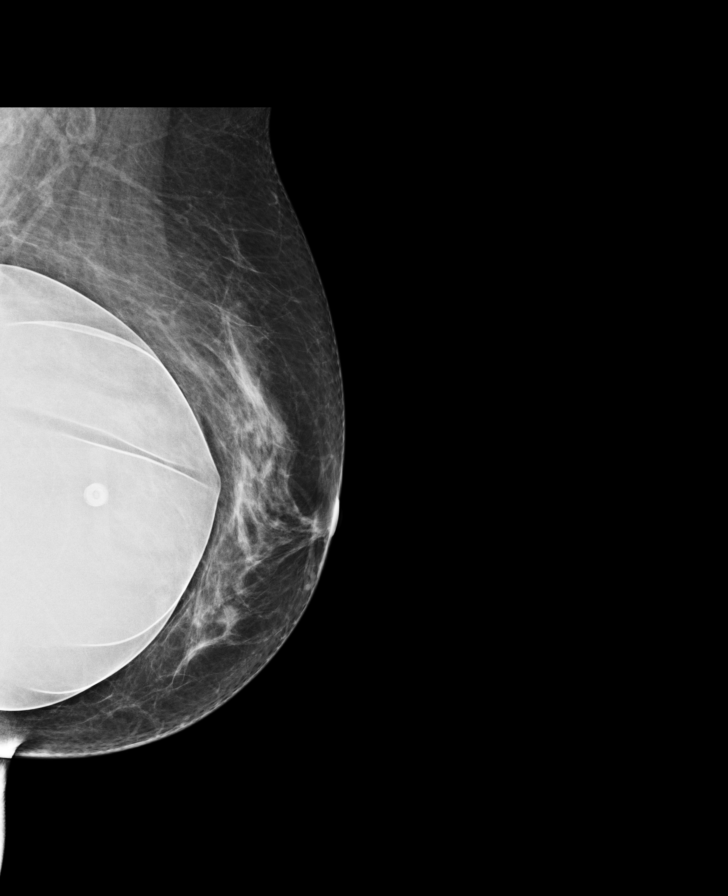

[R MLO (1 of 2)]
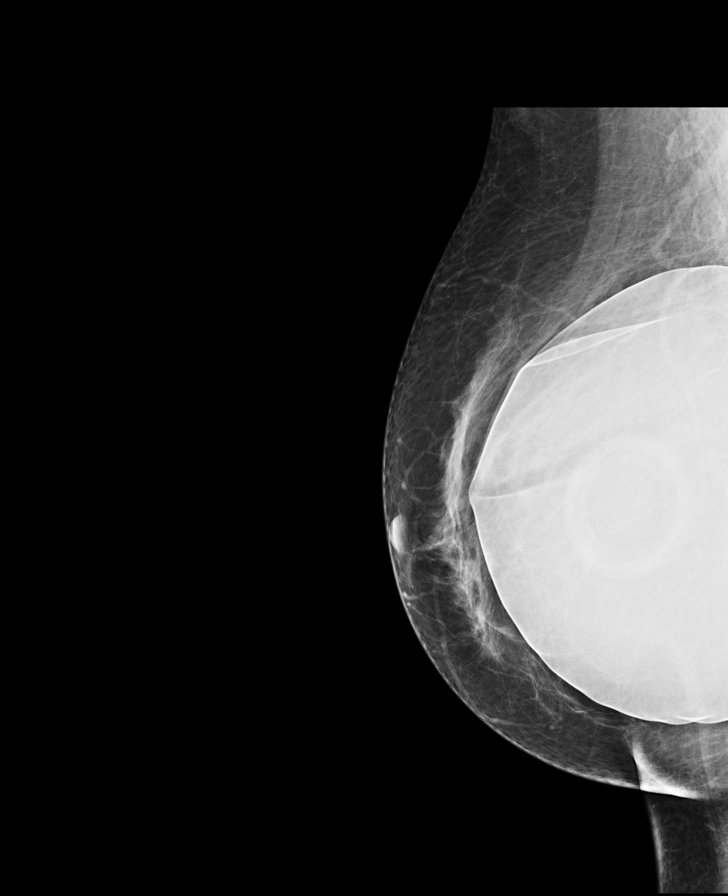

[L CC]
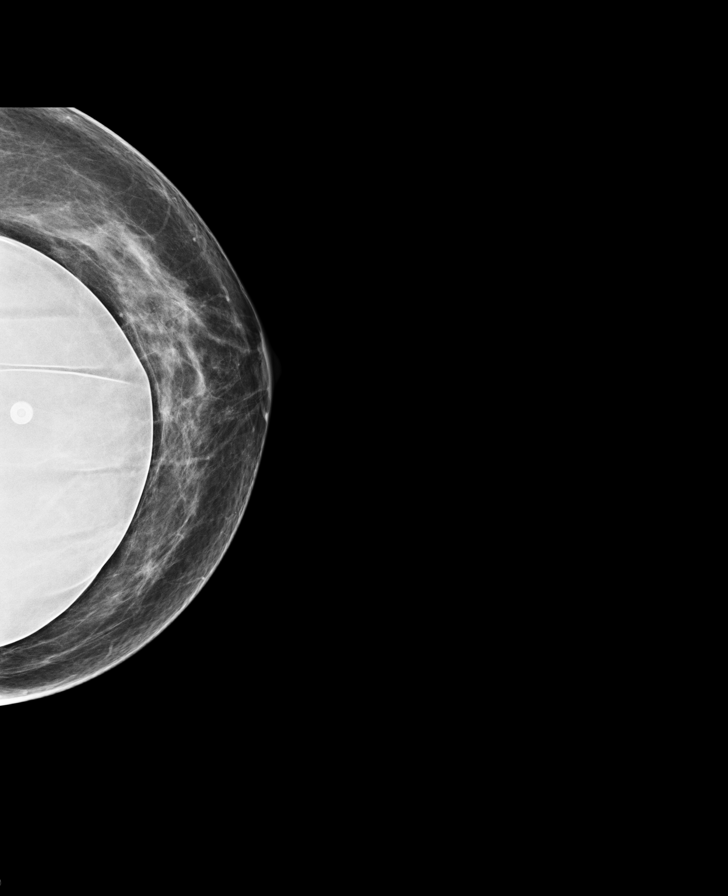

[L CC synth-2D]
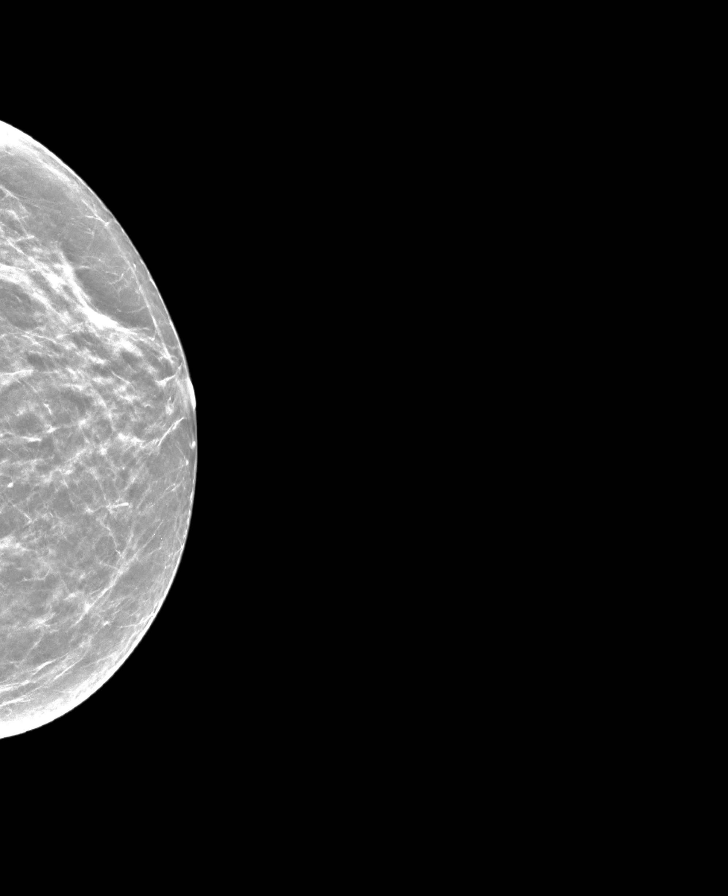

[R MLO (2 of 2)]
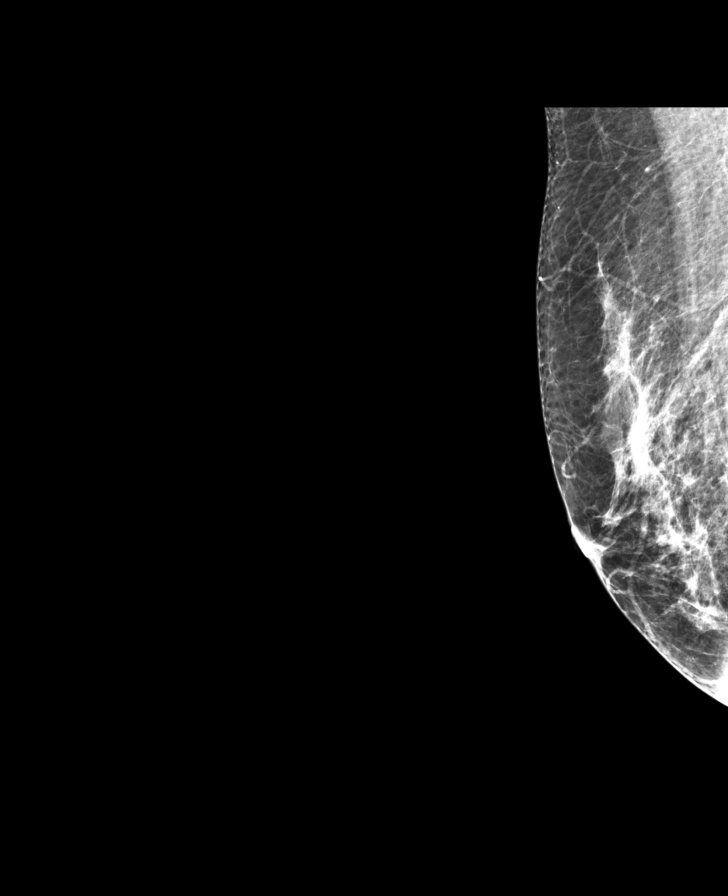

[R CC synth-2D]
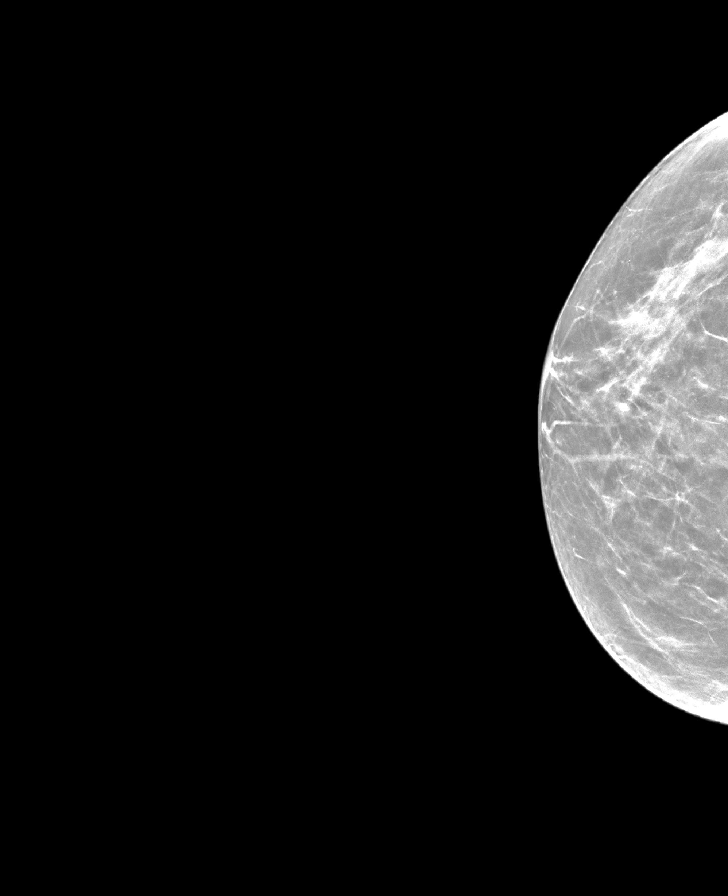

[L MLO (2 of 2)]
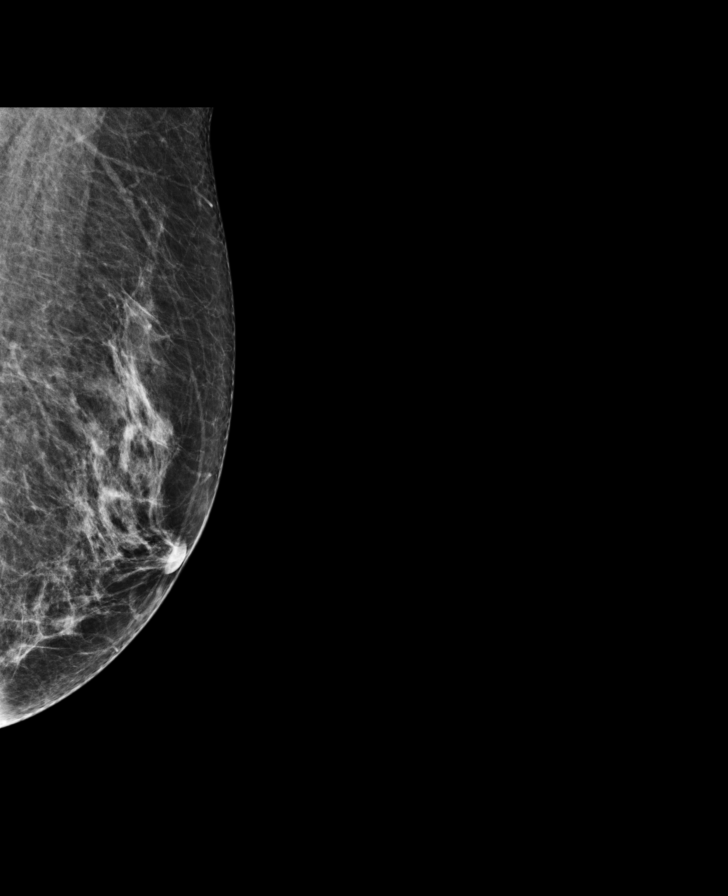

[8 of 32 positions shown; findings below may reference images not displayed]

ACR Breast Density Category c: The breast tissue is heterogeneously
dense, which may obscure small masses.
FINDINGS: There are no findings suspicious for malignancy. Bilateral intact
subpectoral saline implants. Images were processed with CAD.
IMPRESSION: No mammographic evidence of malignancy. A result letter of this
screening mammogram will be mailed directly to the patient.

RECOMMENDATION:
Screening mammogram in one year. (Code:4B-[id number])

BI-RADS CATEGORY  1:  Negative.

## 2018-12-07 ENCOUNTER — Other Ambulatory Visit: Payer: Self-pay | Admitting: Physician Assistant

## 2018-12-07 DIAGNOSIS — E119 Type 2 diabetes mellitus without complications: Secondary | ICD-10-CM

## 2019-02-11 ENCOUNTER — Other Ambulatory Visit: Payer: Self-pay | Admitting: Gastroenterology

## 2019-02-27 ENCOUNTER — Other Ambulatory Visit: Payer: Self-pay | Admitting: Rheumatology

## 2019-03-03 NOTE — Telephone Encounter (Signed)
Last Visit: 03/13/18 Next visit was due January 2020. Message sent to the front to schedule patient.   Okay to refill Gabapentin?

## 2019-03-03 NOTE — Telephone Encounter (Signed)
Patient will require a follow up visit prior to refilling Gabapentin.

## 2019-03-03 NOTE — Telephone Encounter (Signed)
Please schedule patient for a follow up visit. Patient was due January 2020 Thanks!

## 2019-03-04 NOTE — Telephone Encounter (Signed)
I spoke with patient in reference to scheduling a follow up appt. Per patient, her insurance changed. She has to see [facility] doctors now. Patient does not need a follow up appt.

## 2020-01-23 LAB — HM MAMMOGRAPHY

## 2020-07-09 ENCOUNTER — Other Ambulatory Visit: Payer: Self-pay | Admitting: Physician Assistant

## 2020-07-09 DIAGNOSIS — R109 Unspecified abdominal pain: Secondary | ICD-10-CM

## 2020-07-09 DIAGNOSIS — R1011 Right upper quadrant pain: Secondary | ICD-10-CM

## 2020-07-27 ENCOUNTER — Other Ambulatory Visit: Payer: BLUE CROSS/BLUE SHIELD

## 2020-07-29 ENCOUNTER — Ambulatory Visit
Admission: RE | Admit: 2020-07-29 | Discharge: 2020-07-29 | Disposition: A | Discharge status: Home / Self Care | Payer: BLUE CROSS/BLUE SHIELD | Source: Ambulatory Visit | Attending: Physician Assistant | Admitting: Physician Assistant

## 2020-07-29 DIAGNOSIS — R10A1 Flank pain, right side: Secondary | ICD-10-CM

## 2020-07-29 DIAGNOSIS — R109 Unspecified abdominal pain: Secondary | ICD-10-CM

## 2020-07-29 DIAGNOSIS — R1011 Right upper quadrant pain: Secondary | ICD-10-CM

## 2021-03-09 ENCOUNTER — Ambulatory Visit: Admit: 2021-03-09 | Discharge: 2021-03-09 | Payer: BLUE CROSS/BLUE SHIELD | Attending: Family | Primary: Family

## 2021-03-09 DIAGNOSIS — H8103 Meniere's disease, bilateral: Secondary | ICD-10-CM

## 2021-03-09 MED ORDER — VENLAFAXINE HCL ER 75 MG PO CP24
75 MG | ORAL_CAPSULE | Freq: Every day | ORAL | 3 refills | Status: DC
Start: 2021-03-09 — End: 2021-11-25

## 2021-03-09 MED ORDER — OMEPRAZOLE 40 MG PO CPDR
40 MG | ORAL_CAPSULE | Freq: Every day | ORAL | 3 refills | Status: AC
Start: 2021-03-09 — End: 2021-11-25

## 2021-03-09 MED ORDER — ROSUVASTATIN CALCIUM 20 MG PO TABS
20 MG | ORAL_TABLET | ORAL | 3 refills | Status: DC
Start: 2021-03-09 — End: 2021-11-25

## 2021-03-09 MED ORDER — METFORMIN HCL ER 500 MG PO TB24
500 MG | ORAL_TABLET | Freq: Every day | ORAL | 3 refills | Status: DC
Start: 2021-03-09 — End: 2021-11-25

## 2021-03-09 MED ORDER — HYDROCHLOROTHIAZIDE 25 MG PO TABS
25 MG | ORAL_TABLET | ORAL | 3 refills | Status: AC
Start: 2021-03-09 — End: 2021-11-25

## 2021-03-09 NOTE — Progress Notes (Signed)
Christy Cochran is a 65 y.o. female new patient who presents to establish PCP care.    Chief Complaint   Patient presents with   ??? New Patient     New patient to establish care       Recently moved her from [place], [place]. She is originally from [place] and all of her children live her. She has temporary custody of her 7 mo grandson so they have moved back to [place].   A1c in March 22 was 7.0. Is stable on all her medication and just needs refills today.   Review of previous records reveals her most recent baseline lab work was in November 2021. Her last lipid profile was in 2019.            PMH   has a past medical history of Anxiety, Depression, Diabetes mellitus (HCC), Fibromyalgia, Hyperlipidemia, Meniere disease, Osteoporosis, and Restless leg.      Allergies   Allergen Reactions   ??? Cephalexin Hives and Other (See Comments)   ??? Nitrofurantoin Hives, Nausea And Vomiting and Other (See Comments)     Blisters     ??? Nitrofurantoin Macrocrystal Other (See Comments) and Hives     blisters  blisters     ??? Sulfa Antibiotics Hives and Itching   ??? Pentazocine-Naloxone Hcl Other (See Comments)   ??? Adhesive Tape Other (See Comments) and Hives     Tears skins  Tears skins     ??? Codeine Itching and Other (See Comments)       Current Outpatient Medications on File Prior to Visit   Medication Sig Dispense Refill   ??? calcium acetate (PHOSLO) 667 MG CAPS capsule TAKE 1 CAPSULE BY MOUTH THREE TIMES DAILY BEFORE MEALS     ??? FARXIGA 5 MG tablet TAKE 1 TABLET BY MOUTH EVERY DAY     ??? TRULICITY 4.5 MG/0.5ML SOPN INJECT 1 PEN UNDER THE SKIN ONCE WEEKLY     ??? vitamin D (ERGOCALCIFEROL) 1.25 MG (50000 UT) CAPS capsule TAKE 1 CAPSULE BY MOUTH ONCE A WEEK     ??? gabapentin (NEURONTIN) 300 MG capsule TAKE 1 CASPULE BY MOUTH TWICE DAILY     ??? pramipexole (MIRAPEX) 0.125 MG tablet TAKE 3 TABLETS BY MOUTH EVERY MORNING     ??? Aspirin 81 MG CAPS Take 81 mg by mouth every evening     ??? blood glucose test strips (EXACTECH TEST) strip Check BG 2  times/day.  Dx E11.65     ??? DANDELION ROOT PO Take 500 mg by mouth     ??? loratadine (CLARITIN) 10 MG capsule Take by mouth daily     ??? fluticasone (FLONASE) 50 MCG/ACT nasal spray 1 spray by Nasal route as needed     ??? diclofenac sodium (VOLTAREN) 1 % GEL Apply topically 2 times daily       No current facility-administered medications on file prior to visit.        HOSPITALIZATIONS  none    PREVIOUS PRIMARY CARE PROVIDER  Family Medicine in [place], [place]    RECORDS  Provided by patient: none   Records available in EMR and CareEverywhere and reviewed    Naval architect, endocrinology      Review of Systems:  Review of Systems   Constitutional: Negative for appetite change, fatigue, fever and unexpected weight change.   HENT: Negative for congestion, ear pain, postnasal drip, sinus pain and sore throat.    Eyes: Negative for pain.   Respiratory: Negative  for cough, shortness of breath and wheezing.    Cardiovascular: Negative for palpitations and leg swelling.   Gastrointestinal: Negative for abdominal pain, constipation, diarrhea, nausea and vomiting.   Genitourinary: Negative for dysuria, frequency and urgency.   Musculoskeletal: Negative for back pain and joint swelling.   Skin: Negative for rash and wound.   Neurological: Negative for dizziness, weakness and headaches.   Hematological: Does not bruise/bleed easily.          PHYSICAL EXAM   BP 122/70 (Site: Right Upper Arm, Position: Sitting, Cuff Size: Medium Adult)    Pulse 64    Temp 98 ??F (36.7 ??C) (Oral)    Resp 12    Ht 5\' 4"  (1.626 m)    Wt 162 lb (73.5 kg)    SpO2 98%    BMI 27.81 kg/m??        Physical Exam  Vitals reviewed.   Constitutional:       Appearance: Normal appearance.   HENT:      Head: Normocephalic and atraumatic.   Eyes:      Extraocular Movements: Extraocular movements intact.      Pupils: Pupils are equal, round, and reactive to light.   Cardiovascular:      Rate and Rhythm: Normal rate and regular rhythm.      Heart sounds:  Normal heart sounds.   Pulmonary:      Effort: Pulmonary effort is normal.      Breath sounds: Normal breath sounds.   Abdominal:      General: Abdomen is flat.   Skin:     General: Skin is warm and dry.   Neurological:      General: No focal deficit present.      Mental Status: She is alert and oriented to person, place, and time.          No results found for this visit on 03/09/21.    DIAGNOSIS  Meniere's disease of both ears  -     hydroCHLOROthiazide (HYDRODIURIL) 25 MG tablet; TAKE 1 TABLET BY MOUTH EVERY DAY, Disp-90 tablet, R-3Normal  Anxiety state  -     venlafaxine (EFFEXOR XR) 75 MG extended release capsule; Take 1 capsule by mouth daily, Disp-90 capsule, R-3Normal  Gastroesophageal reflux disease without esophagitis  -     omeprazole (PRILOSEC) 40 MG delayed release capsule; Take 1 capsule by mouth daily, Disp-90 capsule, R-3Normal  Type 2 diabetes mellitus without complication, without long-term current use of insulin (HCC)  -     metFORMIN (GLUCOPHAGE-XR) 500 MG extended release tablet; Take 1 tablet by mouth daily (with breakfast), Disp-90 tablet, R-3Normal  -     CBC with Auto Differential; Future  -     Comprehensive Metabolic Panel; Future  -     Hemoglobin A1C; Future  Mixed hyperlipidemia  -     rosuvastatin (CRESTOR) 20 MG tablet; TAKE 1 TABLET BY MOUTH EVERY DAY, Disp-90 tablet, R-3Normal  -     CBC with Auto Differential; Future  -     Comprehensive Metabolic Panel; Future  -     Lipid Panel; Future     Medications reconciled and refills sent as needed.   Will have patient return in 2 months and obtain labs prior to visit.   Pt was encouraged to sign up/go to MyChart for access to information at a later time.     Return in about 2 months (around 05/10/2021) for labs.  Lawson Radar, APRN - CNP   [place]  [facility]  [place] [address]  Dept: [phone]  Dept Fax: [phone]

## 2021-03-31 ENCOUNTER — Telehealth

## 2021-03-31 MED ORDER — TRULICITY 4.5 MG/0.5ML SC SOPN
4.5 MG/0.5ML | PEN_INJECTOR | SUBCUTANEOUS | 3 refills | Status: DC
Start: 2021-03-31 — End: 2021-10-06

## 2021-03-31 MED ORDER — GABAPENTIN 300 MG PO CAPS
300 MG | ORAL_CAPSULE | ORAL | 1 refills | Status: DC
Start: 2021-03-31 — End: 2021-10-05

## 2021-03-31 NOTE — Telephone Encounter (Signed)
Refills sent as requested.     Iris Tatsch, APRN - CNP

## 2021-03-31 NOTE — Telephone Encounter (Signed)
Patient requesting Rx refill for the following:     Trulicity 4.5 mg/0.5 mL    Gabapentin 300 mg    [facility]  [facility]

## 2021-04-21 NOTE — Telephone Encounter (Addendum)
Patient called Mirapex 0.125 to be sent to [facility]. Message was not sent on Friday. Patient was last seen July. Upcoming appt in Sept.

## 2021-04-25 MED ORDER — PRAMIPEXOLE DIHYDROCHLORIDE 0.125 MG PO TABS
0.125 MG | ORAL_TABLET | ORAL | 3 refills | Status: AC
Start: 2021-04-25 — End: 2021-11-25

## 2021-04-25 NOTE — Telephone Encounter (Signed)
erroneous

## 2021-04-25 NOTE — Telephone Encounter (Signed)
Refills sent as requested.     Keyry Iracheta, APRN - CNP

## 2021-05-17 ENCOUNTER — Encounter: Admit: 2021-05-17 | Discharge: 2021-05-17 | Payer: BLUE CROSS/BLUE SHIELD | Primary: Family

## 2021-05-17 DIAGNOSIS — E119 Type 2 diabetes mellitus without complications: Secondary | ICD-10-CM

## 2021-05-17 LAB — CBC WITH AUTO DIFFERENTIAL
Absolute Eos #: 0.2 10*3/uL (ref 0.0–0.8)
Absolute Immature Granulocyte: 0 10*3/uL (ref 0.0–0.5)
Absolute Lymph #: 1.3 10*3/uL (ref 0.5–4.6)
Absolute Mono #: 0.4 10*3/uL (ref 0.1–1.3)
Basophils Absolute: 0 10*3/uL (ref 0.0–0.2)
Basophils: 1 % (ref 0.0–2.0)
Eosinophils %: 5 % (ref 0.5–7.8)
Hematocrit: 41.2 % (ref 35.8–46.3)
Hemoglobin: 13.2 g/dL (ref 11.7–15.4)
Immature Granulocytes: 0 % (ref 0.0–5.0)
Lymphocytes: 26 % (ref 13–44)
MCH: 30.9 PG (ref 26.1–32.9)
MCHC: 32 g/dL (ref 31.4–35.0)
MCV: 96.5 FL (ref 79.6–97.8)
MPV: 10.7 FL (ref 9.4–12.3)
Monocytes: 7 % (ref 4.0–12.0)
Platelets: 228 10*3/uL (ref 150–450)
RBC: 4.27 M/uL (ref 4.05–5.2)
RDW: 13.2 % (ref 11.9–14.6)
Seg Neutrophils: 61 % (ref 43–78)
Segs Absolute: 3 10*3/uL (ref 1.7–8.2)
WBC: 4.9 10*3/uL (ref 4.3–11.1)
nRBC: 0 10*3/uL (ref 0.0–0.2)

## 2021-05-18 LAB — COMPREHENSIVE METABOLIC PANEL
ALT: 47 U/L (ref 12–65)
AST: 20 U/L (ref 15–37)
Albumin/Globulin Ratio: 1.4 (ref 1.2–3.5)
Albumin: 3.7 g/dL (ref 3.2–4.6)
Alk Phosphatase: 85 U/L (ref 50–136)
Anion Gap: 6 mmol/L (ref 4–13)
BUN: 17 MG/DL (ref 8–23)
CO2: 27 mmol/L (ref 21–32)
Calcium: 8.9 MG/DL (ref 8.3–10.4)
Chloride: 105 mmol/L (ref 101–110)
Creatinine: 0.9 MG/DL (ref 0.6–1.0)
GFR African American: >60 mL/min/{1.73_m2} (ref >60)
GFR Non-African American: >60 mL/min/{1.73_m2} (ref >60)
Globulin: 2.7 g/dL (ref 2.3–3.5)
Glucose: 254 mg/dL — ABNORMAL HIGH (ref 65–100)
Potassium: 3.6 mmol/L (ref 3.5–5.1)
Sodium: 138 mmol/L (ref 136–145)
Total Bilirubin: 0.4 MG/DL (ref 0.2–1.1)
Total Protein: 6.4 g/dL (ref 6.3–8.2)

## 2021-05-18 LAB — LIPID PANEL
Chol/HDL Ratio: 2.2
Cholesterol, Total: 140 MG/DL (ref <200)
HDL: 65 MG/DL — ABNORMAL HIGH (ref 40–60)
LDL Calculated: 53 MG/DL (ref <100)
Triglycerides: 110 MG/DL (ref 35–150)
VLDL Cholesterol Calculated: 22 MG/DL (ref 6.0–23.0)

## 2021-05-18 LAB — HEMOGLOBIN A1C
Hemoglobin A1C: 7.1 % — ABNORMAL HIGH (ref 4.8–5.6)
eAG: 157 mg/dL

## 2021-05-27 ENCOUNTER — Ambulatory Visit: Admit: 2021-05-27 | Discharge: 2021-05-27 | Payer: BLUE CROSS/BLUE SHIELD | Attending: Family | Primary: Family

## 2021-05-27 DIAGNOSIS — E119 Type 2 diabetes mellitus without complications: Secondary | ICD-10-CM

## 2021-05-27 MED ORDER — LOSARTAN POTASSIUM 50 MG PO TABS
50 MG | ORAL_TABLET | Freq: Every day | ORAL | 3 refills | Status: AC
Start: 2021-05-27 — End: 2022-05-26

## 2021-05-27 NOTE — Progress Notes (Signed)
Christy Cochran (DOB:  Jun 07, 1956) is a 65 y.o. female,Established patient, here for evaluation of the following chief complaint(s):  Follow-up (Labs//)         ASSESSMENT/PLAN:  1. Type 2 diabetes mellitus without complication, without long-term current use of insulin (HCC)  -     Hemoglobin A1C; Future  -     Microalbumin / creatinine urine ratio; Future  2. Hyperlipidemia, unspecified hyperlipidemia type  -     Comprehensive Metabolic Panel; Future  -     Lipid Panel; Future  3. Primary hypertension  -     CBC with Auto Differential; Future  -     Comprehensive Metabolic Panel; Future  -     losartan (COZAAR) 50 MG tablet; Take 1 tablet by mouth daily, Disp-90 tablet, R-3Normal  4. Need for hepatitis C screening test  -     Hepatitis C Antibody; Future  5. Encounter for screening mammogram for breast cancer  -     MAM Digital Screen Bilateral [[id number]]; Future    Discussed glycemic control. She would like to try cleaning up her diet and exercising more before we adjust her medications. I support this. Her A1c is 7.1, which is reasonable, but given her active lifestyle and life expectancy, I'd like to avoid complications as long as possible. Will continue her current medications and reevaluate in 6 months.  Refills as needed. Mammogram ordered.   Return in about 6 months (around 11/24/2021).         Subjective   SUBJECTIVE/OBJECTIVE:  Here for follow up. Doing well. Adjusting to the area and caring for her grandson. She did pick up a part time job just to get out of the house. Otherwise without complaints.     Review of Systems   Constitutional:  Negative for appetite change, fatigue, fever and unexpected weight change.   HENT:  Negative for congestion, ear pain, postnasal drip, sinus pain and sore throat.    Eyes:  Negative for pain.   Respiratory:  Negative for cough, shortness of breath and wheezing.    Cardiovascular:  Negative for palpitations and leg swelling.   Gastrointestinal:  Negative for abdominal  pain, constipation, diarrhea, nausea and vomiting.   Genitourinary:  Negative for dysuria, frequency and urgency.   Musculoskeletal:  Negative for back pain and joint swelling.   Skin:  Negative for rash and wound.   Neurological:  Negative for dizziness, weakness and headaches.   Hematological:  Does not bruise/bleed easily.        Objective   Physical Exam  Vitals reviewed.   Constitutional:       Appearance: Normal appearance.   HENT:      Head: Normocephalic and atraumatic.   Eyes:      Extraocular Movements: Extraocular movements intact.      Pupils: Pupils are equal, round, and reactive to light.   Cardiovascular:      Rate and Rhythm: Normal rate and regular rhythm.      Heart sounds: Normal heart sounds.   Pulmonary:      Effort: Pulmonary effort is normal.      Breath sounds: Normal breath sounds.   Abdominal:      General: Abdomen is flat.   Skin:     General: Skin is warm and dry.   Neurological:      General: No focal deficit present.      Mental Status: She is alert and oriented to person, place, and time.  An electronic signature was used to authenticate this note.    --Lawson Radar, APRN - CNP

## 2021-07-28 ENCOUNTER — Inpatient Hospital Stay: Admit: 2021-07-28 | Payer: BLUE CROSS/BLUE SHIELD | Primary: Family

## 2021-07-28 DIAGNOSIS — Z1231 Encounter for screening mammogram for malignant neoplasm of breast: Secondary | ICD-10-CM

## 2021-08-02 NOTE — Telephone Encounter (Signed)
Contacted patient. Advised of results and provider comments. Verbalized understanding. No questions.     Christy Cochran

## 2021-08-02 NOTE — Telephone Encounter (Signed)
-----   Message from [facility], APRN - CNP sent at 08/02/2021  8:06 AM EST -----  Please let patient know her mammogram was normal. We will repeat in 1 year.     Argie Ramming, APRN - CNP

## 2021-09-29 NOTE — Telephone Encounter (Unsigned)
Patient called requesting a refill for Farxiga 5 mg to be sent to [facility]. Patient also wanted to know if she could get something to replace Trulicity

## 2021-10-05 ENCOUNTER — Telehealth

## 2021-10-05 MED ORDER — GABAPENTIN 300 MG PO CAPS
300 MG | ORAL_CAPSULE | ORAL | 1 refills | Status: DC
Start: 2021-10-05 — End: 2021-11-25

## 2021-10-05 MED ORDER — FARXIGA 5 MG PO TABS
5 MG | ORAL_TABLET | Freq: Every morning | ORAL | 3 refills | Status: AC
Start: 2021-10-05 — End: 2022-05-26

## 2021-10-05 NOTE — Telephone Encounter (Signed)
This is a problem with every drug in that class. Has she called any other pharmacies to see if it is stocked there? Does she have access to a mail order pharmacy?     Argie Ramming, APRN - CNP

## 2021-10-05 NOTE — Telephone Encounter (Signed)
"  Patient called requesting a refill for Farxiga 5 mg to be sent to [facility]. Patient also wanted to know if she could get something to replace Trulicity"    She also needs a refill of a Gabapentin 300 mg twice daily.     She would also like a referral to an ENT, Dr. Wilmon Pali at [facility]  F [phone]  For Blisters and sores in nose

## 2021-10-05 NOTE — Telephone Encounter (Signed)
Pt verbalized that she cannot get it at the pharmacy. They are always out of it. Pt said that ppl who are trying to lose weight are getting it. And the ppl that needs it are not able to get it.    Pt verbalized that she rather have the trulicity .    Patient was aware of the provider's notes and voiced understanding.      Christy Cochran

## 2021-10-05 NOTE — Telephone Encounter (Signed)
Refills on gabapentin and Farxiga sent. Referral placed.   We need to discuss the trulicity. Why does she need a replacement?     Argie Ramming, APRN - CNP

## 2021-10-06 ENCOUNTER — Telehealth

## 2021-10-06 MED ORDER — TRULICITY 4.5 MG/0.5ML SC SOPN
4.50.5 MG/0.5ML | SUBCUTANEOUS | 3 refills | Status: AC
Start: 2021-10-06 — End: 2022-02-09

## 2021-10-06 NOTE — Telephone Encounter (Signed)
Pt verbalized that home delivery have been on back order.     Christy Cochran

## 2021-10-06 NOTE — Telephone Encounter (Signed)
Patient was aware of the provider's notes and voiced understanding.      Christy Cochran

## 2021-10-06 NOTE — Telephone Encounter (Signed)
Patient was aware of the provider's notes and voiced understanding.        Christy Cochran

## 2021-10-06 NOTE — Telephone Encounter (Signed)
Noted, No further comment.     Tyquon Near, APRN - CNP

## 2021-10-06 NOTE — Telephone Encounter (Signed)
Please let her know that I can find an alternative but it won't be Trulicity and we will likely have to do some dose adjustments. I have no access to pharmacy stock so I can't tell her where she might be able to find Trulicity.   If she wants to talk about this at length, please schedule her an appointment, virtual or telephone is fine.     Argie Ramming, APRN - CNP

## 2021-10-06 NOTE — Telephone Encounter (Signed)
Pt verbalized that she has called other pharmacy.     Pt verbalized that she has not thought about calling home delivery.    Pt verbalized that she is going to give them a call.    Gave pt the number to [facility].     Pt has [facility] Delivery on file      Allen Kell

## 2021-10-06 NOTE — Telephone Encounter (Signed)
Daughter in law found one box of trulicity for the pt.     The pharmacy said it is first come first serve.   So please call in the prescription immediately.    Pharmacy [facility] [address]    Address: [address], [place], [place] [address]

## 2021-10-06 NOTE — Telephone Encounter (Signed)
Refills sent as requested.     Fleet Higham, APRN - CNP

## 2021-10-12 ENCOUNTER — Encounter: Payer: BLUE CROSS/BLUE SHIELD | Attending: Family | Primary: Family

## 2021-11-03 ENCOUNTER — Encounter

## 2021-11-18 ENCOUNTER — Encounter: Admit: 2021-11-18 | Discharge: 2021-11-18 | Payer: BLUE CROSS/BLUE SHIELD | Primary: Medical

## 2021-11-18 DIAGNOSIS — Z1159 Encounter for screening for other viral diseases: Secondary | ICD-10-CM

## 2021-11-18 LAB — CBC WITH AUTO DIFFERENTIAL
Absolute Eos #: 0.1 10*3/uL (ref 0.0–0.8)
Absolute Immature Granulocyte: 0 10*3/uL (ref 0.0–0.5)
Absolute Lymph #: 1.4 10*3/uL (ref 0.5–4.6)
Absolute Mono #: 0.3 10*3/uL (ref 0.1–1.3)
Basophils Absolute: 0 10*3/uL (ref 0.0–0.2)
Basophils: 1 % (ref 0.0–2.0)
Eosinophils %: 2 % (ref 0.5–7.8)
Hematocrit: 43.6 % (ref 35.8–46.3)
Hemoglobin: 14.4 g/dL (ref 11.7–15.4)
Immature Granulocytes: 0 % (ref 0.0–5.0)
Lymphocytes: 32 % (ref 13–44)
MCH: 30.8 PG (ref 26.1–32.9)
MCHC: 33 g/dL (ref 31.4–35.0)
MCV: 93.4 FL (ref 82–102)
MPV: 11 FL (ref 9.4–12.3)
Monocytes: 6 % (ref 4.0–12.0)
Platelets: 220 10*3/uL (ref 150–450)
RBC: 4.67 M/uL (ref 4.05–5.2)
RDW: 13.3 % (ref 11.9–14.6)
Seg Neutrophils: 59 % (ref 43–78)
Segs Absolute: 2.6 10*3/uL (ref 1.7–8.2)
WBC: 4.4 10*3/uL (ref 4.3–11.1)
nRBC: 0 10*3/uL (ref 0.0–0.2)

## 2021-11-18 LAB — COMPREHENSIVE METABOLIC PANEL
ALT: 24 U/L (ref 12–65)
AST: 18 U/L (ref 15–37)
Albumin/Globulin Ratio: 1.3 (ref 0.4–1.6)
Albumin: 3.9 g/dL (ref 3.2–4.6)
Alk Phosphatase: 90 U/L (ref 50–136)
Anion Gap: 4 mmol/L (ref 2–11)
BUN: 21 MG/DL (ref 8–23)
CO2: 31 mmol/L (ref 21–32)
Calcium: 9.4 MG/DL (ref 8.3–10.4)
Chloride: 101 mmol/L (ref 101–110)
Creatinine: 1 MG/DL (ref 0.6–1.0)
Est, Glom Filt Rate: >60 mL/min/{1.73_m2} (ref >60)
Globulin: 2.9 g/dL (ref 2.8–4.5)
Glucose: 177 mg/dL — ABNORMAL HIGH (ref 65–100)
Potassium: 3.4 mmol/L — ABNORMAL LOW (ref 3.5–5.1)
Sodium: 136 mmol/L (ref 133–143)
Total Bilirubin: 0.6 MG/DL (ref 0.2–1.1)
Total Protein: 6.8 g/dL (ref 6.3–8.2)

## 2021-11-18 LAB — LIPID PANEL
Chol/HDL Ratio: 2
Cholesterol, Total: 136 MG/DL (ref <200)
HDL: 69 MG/DL — ABNORMAL HIGH (ref 40–60)
LDL Calculated: 44 MG/DL (ref <100)
Triglycerides: 115 MG/DL (ref 35–150)
VLDL Cholesterol Calculated: 23 MG/DL (ref 6.0–23.0)

## 2021-11-18 LAB — HEMOGLOBIN A1C
Hemoglobin A1C: 8 % — ABNORMAL HIGH (ref 4.8–5.6)
eAG: 183 mg/dL

## 2021-11-18 LAB — MICROALBUMIN / CREATININE URINE RATIO
Creatinine, Ur: 151 mg/dL
Microalbumin Creatinine Ratio: 16 mg/g (ref 0–30)
Microalbumin, Random Urine: 2.36 MG/DL (ref 0.0–3.0)

## 2021-11-19 LAB — VITAMIN D 25 HYDROXY: Vit D, 25-Hydroxy: 33.9 ng/mL (ref 30.0–100.0)

## 2021-11-19 LAB — HEPATITIS C ANTIBODY: Hepatitis C Ab: NONREACTIVE

## 2021-11-25 ENCOUNTER — Encounter: Payer: BLUE CROSS/BLUE SHIELD | Attending: Family | Primary: Medical

## 2021-11-25 ENCOUNTER — Encounter: Admit: 2021-11-25 | Discharge: 2021-11-25 | Payer: BLUE CROSS/BLUE SHIELD | Attending: Medical | Primary: Medical

## 2021-11-25 DIAGNOSIS — E119 Type 2 diabetes mellitus without complications: Secondary | ICD-10-CM

## 2021-11-25 MED ORDER — FLUTICASONE PROPIONATE 50 MCG/ACT NA SUSP
50 MCG/ACT | Freq: Two times a day (BID) | NASAL | 11 refills | Status: AC
Start: 2021-11-25 — End: 2023-03-20

## 2021-11-25 MED ORDER — ROSUVASTATIN CALCIUM 20 MG PO TABS
20 MG | ORAL_TABLET | ORAL | 3 refills | Status: AC
Start: 2021-11-25 — End: 2022-11-17

## 2021-11-25 MED ORDER — AMMONIUM LACTATE 12 % EX CREA
12 % | CUTANEOUS | 5 refills | Status: AC
Start: 2021-11-25 — End: 2021-12-25

## 2021-11-25 MED ORDER — METFORMIN HCL ER 500 MG PO TB24
500 MG | ORAL_TABLET | Freq: Every day | ORAL | 3 refills | Status: AC
Start: 2021-11-25 — End: 2022-05-26

## 2021-11-25 MED ORDER — VITAMIN D (ERGOCALCIFEROL) 1.25 MG (50000 UT) PO CAPS
1.25 MG (50000 UT) | ORAL_CAPSULE | ORAL | 3 refills | Status: AC
Start: 2021-11-25 — End: 2022-11-17

## 2021-11-25 MED ORDER — OMEPRAZOLE 40 MG PO CPDR
40 MG | ORAL_CAPSULE | Freq: Every day | ORAL | 3 refills | Status: AC
Start: 2021-11-25 — End: 2022-11-17

## 2021-11-25 MED ORDER — VENLAFAXINE HCL ER 75 MG PO CP24
75 MG | ORAL_CAPSULE | Freq: Every day | ORAL | 3 refills | Status: AC
Start: 2021-11-25 — End: 2022-11-17

## 2021-11-25 MED ORDER — GABAPENTIN 300 MG PO CAPS
300 MG | ORAL_CAPSULE | ORAL | 1 refills | Status: AC
Start: 2021-11-25 — End: 2022-02-25

## 2021-11-25 MED ORDER — PRAMIPEXOLE DIHYDROCHLORIDE 0.125 MG PO TABS
0.125 MG | ORAL_TABLET | ORAL | 3 refills | Status: AC
Start: 2021-11-25 — End: 2022-11-14

## 2021-11-25 MED ORDER — HYDROCHLOROTHIAZIDE 25 MG PO TABS
25 MG | ORAL_TABLET | ORAL | 3 refills | Status: AC
Start: 2021-11-25 — End: 2022-11-17

## 2021-11-25 MED ORDER — MUPIROCIN 2 % EX OINT
2 % | CUTANEOUS | 1 refills | Status: AC
Start: 2021-11-25 — End: 2021-12-02

## 2021-11-25 NOTE — Progress Notes (Signed)
Christy Cochran (DOB: Jun 04, 1956) is a 66 y.o. female, an established patient, is here for evaluation of the following chief complaint(s):  Chief Complaint   Patient presents with    Diabetes    Cholesterol Problem    Results     This is my first visit w/ Christy Cochran.       ASSESSMENT/PLAN:  Christy Cochran was seen today for diabetes, cholesterol problem and results.    Diagnoses and all orders for this visit:    Type 2 diabetes mellitus without complication, without long-term current use of insulin (HCC)  -     metFORMIN (GLUCOPHAGE-XR) 500 MG extended release tablet; Take 1 tablet by mouth daily (with breakfast)  -     Basic Metabolic Panel; Future  -     Hemoglobin A1C; Future    Dyslipidemia    Age-related osteoporosis without current pathological fracture    Meniere's disease, unspecified laterality    At high risk for falls    Intranasal ulcers  -     mupirocin (BACTROBAN) 2 % ointment; Apply intranasally 2 times daily for 5-7 days    Vitamin Cochran deficiency  -     vitamin Cochran (ERGOCALCIFEROL) 1.25 MG (50000 UT) CAPS capsule; Take 1 capsule by mouth once a week    Seasonal allergic rhinitis due to pollen  -     fluticasone (FLONASE) 50 MCG/ACT nasal spray; 1 spray by Nasal route in the morning and at bedtime    Neuropathy  -     gabapentin (NEURONTIN) 300 MG capsule; TAKE 1 CASPULE BY MOUTH TWICE DAILY    Type 2 diabetes mellitus with diabetic neuropathy, without long-term current use of insulin (HCC)    Fibromyalgia  -     gabapentin (NEURONTIN) 300 MG capsule; TAKE 1 CASPULE BY MOUTH TWICE DAILY    Restless leg syndrome  -     pramipexole (MIRAPEX) 0.125 MG tablet; TAKE 3 TABLETS BY MOUTH EVERY HS    Meniere's disease of both ears  -     hydroCHLOROthiazide (HYDRODIURIL) 25 MG tablet; TAKE 1 TABLET BY MOUTH EVERY DAY    Gastroesophageal reflux disease without esophagitis  -     omeprazole (PRILOSEC) 40 MG delayed release capsule; Take 1 capsule by mouth daily    Mixed hyperlipidemia  -     rosuvastatin (CRESTOR) 20 MG  tablet; TAKE 1 TABLET BY MOUTH EVERY DAY    Anxiety state  -     venlafaxine (EFFEXOR XR) 75 MG extended release capsule; Take 1 capsule by mouth daily    Dry skin  -     ammonium lactate (LAC-HYDRIN) 12 % cream; Apply topically to feet as needed.    Need for prophylactic vaccination against Streptococcus pneumoniae (pneumococcus)  -     Cancel: Pneumococcal Conjugate PCV20, PF (Prevnar 20)    Pneumococcal vaccination administered at current visit  -     Pneumococcal, PCV20, PREVNAR 20, (age 65 yrs+), IM, PF  -     PR ADMIN PNEUMOCOCCAL VACCINE    I reviewed recent lab results w/  Christy Cochran.      Vitamin Cochran level is low/normal.  Currently prescribed Vitamin D2 50,000 iu weekly.  She reports that she has not been taking this regularly and will restart.     Diabetes is poorly controlled.  Current HgA1C is now 8.0%.    Currently prescribed:   Key Antihyperglycemic Medications  TRULICITY 4.5 MG/0.5ML SOPN    Sig: INJECT 1 PEN UNDER THE SKIN ONCE WEEKLY    FARXIGA 5 MG tablet    Sig - Route: Take 1 tablet by mouth every morning - Oral    metFORMIN (GLUCOPHAGE-XR) 500 MG extended release tablet    Sig - Route: Take 1 tablet by mouth daily (with breakfast) - Oral   She's bee having trouble getting Trulicity and was out of this medication x 2 months.  She is able to get it now, by changing pharmacies.  She will also work on a low glycemic diet.     The 10-year ASCVD risk score (Arnett DK, et al., 2019) is: 8.1%    Values used to calculate the score:      Age: 81 years      Sex: Female      Is Non-Hispanic African American: No      Diabetic: Yes      Tobacco smoker: No      Systolic Blood Pressure: 130 mmHg      Is BP treated: No      HDL Cholesterol: 69 MG/DL      Total Cholesterol: 136 MG/DL  Currently prescribed:   Key Hyperlipidemia Meds            rosuvastatin (CRESTOR) 20 MG tablet    Sig: TAKE 1 TABLET BY MOUTH EVERY DAY     Taking Losartan for renal protection.      Taking HCTZ for Meniere's disease.    Symptoms are stable.    Will be seeing ENT next week due to new dev't of blisters and sores in her nose.  This started around May of last year.      Taking gabapentin 300 BID for FMS.      She has dry skin w/ callus formation and a split in the skin on the bottom of her left foot.  Prescribed Amlactin cream to help w/ this.      On the basis of positive falls risk screening, assessment and plan is as follows: pt stumbled and fell recently, but denies any problems w/ dizziness or imbalance.  No PT necessary at this time.      Follow Up    3 mos DM Mgt w/ labs prior to visit.  Please have her sign a release for prior medical records from [facility]--need copy of eye exam visit notes.      On this date, I spent 55 minutes reviewing previous notes, test results and face to face with the patient discussing the diagnosis and importance of compliance with the treatment plan as well as documenting on the day of the visit.      SUBJECTIVE/OBJECTIVE:  No new concerns.           Vitals:    11/25/21 0927   BP: 130/80   Pulse: 66   Resp: 16   Temp: 98.9 F (37.2 C)   TempSrc: Oral   SpO2: 95%   Weight: 156 lb (70.8 kg)   Height: 5\' 4"  (1.626 m)      Diabetic foot exam:   Left Foot:   Visual Exam: callous- under 1st MTP joint w/ a split in the skin.  No open wound, however.    Pulse DP: 2+ (normal)   Filament test: 6/6     Right Foot:   Visual Exam: normal   Pulse DP: 2+ (normal)   Filament test: 6/6       Orders Placed  This Encounter    Pneumococcal, PCV20, PREVNAR 20, (age 36 yrs+), IM, PF    Basic Metabolic Panel     Standing Status:   Future     Standing Expiration Date:   05/27/2022    Hemoglobin A1C     Standing Status:   Future     Standing Expiration Date:   05/27/2022    PR ADMIN PNEUMOCOCCAL VACCINE    vitamin Cochran (ERGOCALCIFEROL) 1.25 MG (50000 UT) CAPS capsule     Sig: Take 1 capsule by mouth once a week     Dispense:  12 capsule     Refill:  3    amoxicillin (AMOXIL) 500 MG capsule     Sig: Take 500 mg by mouth     chlorhexidine (PERIDEX) 0.12 % solution     Sig: Take 0.12 mLs by mouth    mupirocin (BACTROBAN) 2 % ointment     Sig: Apply intranasally 2 times daily for 5-7 days     Dispense:  3 g     Refill:  1    fluticasone (FLONASE) 50 MCG/ACT nasal spray     Sig: 1 spray by Nasal route in the morning and at bedtime     Dispense:  16 g     Refill:  11    gabapentin (NEURONTIN) 300 MG capsule     Sig: TAKE 1 CASPULE BY MOUTH TWICE DAILY     Dispense:  180 capsule     Refill:  1    hydroCHLOROthiazide (HYDRODIURIL) 25 MG tablet     Sig: TAKE 1 TABLET BY MOUTH EVERY DAY     Dispense:  90 tablet     Refill:  3    metFORMIN (GLUCOPHAGE-XR) 500 MG extended release tablet     Sig: Take 1 tablet by mouth daily (with breakfast)     Dispense:  90 tablet     Refill:  3    omeprazole (PRILOSEC) 40 MG delayed release capsule     Sig: Take 1 capsule by mouth daily     Dispense:  90 capsule     Refill:  3    pramipexole (MIRAPEX) 0.125 MG tablet     Sig: TAKE 3 TABLETS BY MOUTH EVERY HS     Dispense:  270 tablet     Refill:  3    rosuvastatin (CRESTOR) 20 MG tablet     Sig: TAKE 1 TABLET BY MOUTH EVERY DAY     Dispense:  90 tablet     Refill:  3    venlafaxine (EFFEXOR XR) 75 MG extended release capsule     Sig: Take 1 capsule by mouth daily     Dispense:  90 capsule     Refill:  3    ammonium lactate (LAC-HYDRIN) 12 % cream     Sig: Apply topically to feet as needed.     Dispense:  385 g     Refill:  5       An electronic signature was used to authenticate this note.  -- Linus Orn, PA

## 2022-02-02 NOTE — Telephone Encounter (Signed)
Patient called requesting a refill for Trulicity  To be sent to [facility]. Has follow up appt end of June. Last seen in March 2023

## 2022-02-02 NOTE — Telephone Encounter (Signed)
LM via patient's voice mail letting her know that Rx was sent for a year supply in February and can be transferred to preferred pharmacy.

## 2022-02-02 NOTE — Telephone Encounter (Signed)
Rx was previously sent to [facility] on 10/06/21 for a year's supply of medication.   She can call [facility] to have them transfer the rx if needed.

## 2022-02-09 ENCOUNTER — Telehealth

## 2022-02-09 MED ORDER — TRULICITY 4.5 MG/0.5ML SC SOPN
4.5 MG/0.5ML | SUBCUTANEOUS | 0 refills | Status: DC
Start: 2022-02-09 — End: 2022-02-24

## 2022-02-09 NOTE — Telephone Encounter (Signed)
Patient called back stating she has been trying to get a refill for her Trulicity. According to the provider response she should have refills. According to the pharmacy [facility] there is NO refills. Please send there. Thanks.

## 2022-02-09 NOTE — Telephone Encounter (Signed)
Prescription filled per pt request.  Please notify pt.

## 2022-02-09 NOTE — Telephone Encounter (Signed)
Patient notified

## 2022-02-17 ENCOUNTER — Encounter: Admit: 2022-02-17 | Discharge: 2022-02-17 | Payer: BLUE CROSS/BLUE SHIELD | Primary: Medical

## 2022-02-17 DIAGNOSIS — E119 Type 2 diabetes mellitus without complications: Secondary | ICD-10-CM

## 2022-02-17 LAB — BASIC METABOLIC PANEL
Anion Gap: 7 mmol/L (ref 2–11)
BUN: 13 MG/DL (ref 8–23)
CO2: 27 mmol/L (ref 21–32)
Calcium: 8.6 MG/DL (ref 8.3–10.4)
Chloride: 105 mmol/L (ref 101–110)
Creatinine: 1 MG/DL (ref 0.6–1.0)
Est, Glom Filt Rate: >60 mL/min/{1.73_m2} (ref >60)
Glucose: 243 mg/dL — ABNORMAL HIGH (ref 65–100)
Potassium: 3.8 mmol/L (ref 3.5–5.1)
Sodium: 139 mmol/L (ref 133–143)

## 2022-02-18 LAB — HEMOGLOBIN A1C
Hemoglobin A1C: 8.5 % — ABNORMAL HIGH (ref 4.8–5.6)
eAG: 197 mg/dL

## 2022-02-24 ENCOUNTER — Telehealth

## 2022-02-24 ENCOUNTER — Ambulatory Visit: Admit: 2022-02-24 | Discharge: 2022-02-24 | Payer: BLUE CROSS/BLUE SHIELD | Attending: Medical | Primary: Medical

## 2022-02-24 DIAGNOSIS — E1140 Type 2 diabetes mellitus with diabetic neuropathy, unspecified: Secondary | ICD-10-CM

## 2022-02-24 MED ORDER — TRESIBA FLEXTOUCH 100 UNIT/ML SC SOPN
100 UNIT/ML | SUBCUTANEOUS | 5 refills | Status: DC
Start: 2022-02-24 — End: 2022-02-24

## 2022-02-24 MED ORDER — TRESIBA FLEXTOUCH 100 UNIT/ML SC SOPN
100 UNIT/ML | SUBCUTANEOUS | 5 refills | Status: AC
Start: 2022-02-24 — End: 2022-05-26

## 2022-02-24 MED ORDER — TRULICITY 4.5 MG/0.5ML SC SOPN
4.5 MG/0.5ML | SUBCUTANEOUS | 11 refills | Status: AC
Start: 2022-02-24 — End: 2022-11-17

## 2022-02-24 NOTE — Telephone Encounter (Signed)
Resent rx for [place] w/ note of max daily dose 20 u

## 2022-02-24 NOTE — Telephone Encounter (Signed)
Molly from [facility] called in and verbalized in order to bill insurance purposes she has to have maxium of units to take per day.     Call back [phone]

## 2022-02-24 NOTE — Progress Notes (Signed)
Christy Cochran (DOB: [birth year]) is a 66 y.o. female, an established patient, is here for evaluation of the following chief complaint(s):  Chief Complaint   Patient presents with    Diabetes    Results       ASSESSMENT/PLAN:  Christy Cochran was seen today for diabetes and results.    Diagnoses and all orders for this visit:    Type 2 diabetes mellitus with diabetic neuropathy, without long-term current use of insulin (HCC)  -     TRULICITY 4.5 MG/0.5ML SOPN; INJECT 0.5 mg weekly UNDER THE SKIN  -     Insulin Degludec (TRESIBA FLEXTOUCH) 100 UNIT/ML SOPN; Take 10 units daily, can increase dose by 2 U every Mon and Friday until FBS are treated to goal.    Dry skin  Comments:  Feet    Vitamin D deficiency    Dyslipidemia    Fibromyalgia    Restless leg syndrome    Age-related osteoporosis without current pathological fracture  -     DEXA BONE DENSITY AXIAL SKELETON; Future    Type 2 diabetes mellitus without complication, without long-term current use of insulin (HCC)    Encounter for screening mammogram for malignant neoplasm of breast  -     MAM DIGITAL SCREEN W OR WO CAD BILATERAL; Future        I reviewed recent lab results w/  Christy Cochran.      Diabetes is poorly controlled.  Her HgA1C is up to 8.5% since last March.    Currently prescribed:   Key Antihyperglycemic Medications            TRULICITY 4.5 MG/0.5ML SOPN    Sig: INJECT 1 PEN UNDER THE SKIN ONCE WEEKLY    metFORMIN (GLUCOPHAGE-XR) 500 MG extended release tablet    Sig - Route: Take 1 tablet by mouth daily (with breakfast) - Oral    FARXIGA 5 MG tablet    Sig - Route: Take 1 tablet by mouth every morning - Oral   She was started on Novolg 2 units/meal/prn when seen in the ER on 01/18/22.  Will d/c Novolog and start on Tresiba 10 units w/ fluctating scale instructions.     Renal function is WNL    BP today is: well controlled.    Currently prescribed:   Key Anti-Hypertensive Meds            hydroCHLOROthiazide (HYDRODIURIL) 25 MG tablet    Sig: TAKE 1 TABLET BY  MOUTH EVERY DAY    losartan (COZAAR) 50 MG tablet    Sig - Route: Take 1 tablet by mouth daily - Oral       She has Vit D Deficiency and Osteoporosis.  Prescribed Vitamin D2 50,000 iu weekly & was previously started on Fosamax, but stopped after having some dental work (about 5-7 years ago) and never restarted.     Due to repeat DEXA.      Taking gabapentin 300 BID for FMS, RLS & Neuropathy.     Taking Losartan for renal protection.      Taking HCTZ for Meniere's disease.   Symptoms are stable.  Followed by ENT.    Seen in the ER on 01/18/22 for Reactive Airway disorder.  Was given prednisone despite diabetes not being treated to goal.  They also started her on Novolog 2u w/ meals.     On this date, I spent 40 minutes reviewing previous notes, test results and face to face with the patient discussing the  diagnosis and importance of compliance with the treatment plan as well as documenting on the day of the visit.        Follow Up    Please have her sign a release for prior medical records from [facility]--need copy of last eye exam visit notes.   3 mos DM mgt w/ labs prior to visit    SUBJECTIVE/OBJECTIVE:  At her visit On 11/25/21, the following was discussed:     Diabetes is poorly controlled.  Current HgA1C is now 8.0%.    --She's bee having trouble getting Trulicity and was out of this medication x 2 months.  She is able to get it now, by changing pharmacies.  She will also work on a low glycemic diet.     Vitamin D level is low/normal.  Currently prescribed Vitamin D2 50,000 iu weekly.  She reports that she has not been taking this regularly and will restart.     She has dry skin w/ callus formation and a split in the skin on the bottom of her left foot.  Prescribed Amlactin cream to help w/ this.     At today's visit:     blood sugar was 132 yesterday and 234 this morning before meal.  Was given Novolog when seen at the ER in May due to taking a steroid medication for bronchitis.              Vitals:     02/24/22 0956   BP: 106/70   Pulse: 65   Resp: 14   Temp: 98.7 F (37.1 C)   TempSrc: Oral   SpO2: 95%   Weight: 156 lb (70.8 kg)   Height: 5\' 4"  (1.626 m)              Orders Placed This Encounter    DEXA BONE DENSITY AXIAL SKELETON     Standing Status:   Future     Standing Expiration Date:   08/26/2022    MAM DIGITAL SCREEN W OR WO CAD BILATERAL     Standing Status:   Future     Standing Expiration Date:   04/27/2023     Order Specific Question:   Reason for exam:     Answer:   pt has breast implants    DISCONTD: NOVOLOG FLEXPEN 100 UNIT/ML injection pen     Sig: INJECT UNDER THE SKIN ACCORDING TO SLIDING SCALE THREE TIMES DAILY WITH MEALS MAXIMUM DAILY DOSE IS 30 UNITS PER DAY    TRULICITY 4.5 MG/0.5ML SOPN     Sig: INJECT 0.5 mg weekly UNDER THE SKIN     Dispense:  4 Adjustable Dose Pre-filled Pen Syringe     Refill:  11    Insulin Degludec (TRESIBA FLEXTOUCH) 100 UNIT/ML SOPN     Sig: Take 10 units daily, can increase dose by 2 U every Mon and Friday until FBS are treated to goal.     Dispense:  3 Adjustable Dose Pre-filled Pen Syringe     Refill:  5         An electronic signature was used to authenticate this note.  -- Linus Orn, PA

## 2022-04-17 ENCOUNTER — Encounter

## 2022-05-09 ENCOUNTER — Encounter

## 2022-05-19 ENCOUNTER — Encounter: Admit: 2022-05-19 | Discharge: 2022-05-19 | Payer: BLUE CROSS/BLUE SHIELD | Primary: Medical

## 2022-05-19 DIAGNOSIS — E1140 Type 2 diabetes mellitus with diabetic neuropathy, unspecified: Secondary | ICD-10-CM

## 2022-05-19 LAB — BASIC METABOLIC PANEL
Anion Gap: 5 mmol/L (ref 2–11)
BUN: 17 MG/DL (ref 8–23)
CO2: 29 mmol/L (ref 21–32)
Calcium: 9.1 MG/DL (ref 8.3–10.4)
Chloride: 106 mmol/L (ref 101–110)
Creatinine: 0.9 MG/DL (ref 0.6–1.0)
Est, Glom Filt Rate: >60 mL/min/{1.73_m2} (ref >60)
Glucose: 162 mg/dL — ABNORMAL HIGH (ref 65–100)
Potassium: 3.8 mmol/L (ref 3.5–5.1)
Sodium: 140 mmol/L (ref 133–143)

## 2022-05-19 LAB — VITAMIN D 25 HYDROXY: Vit D, 25-Hydroxy: 58.3 ng/mL (ref 30.0–100.0)

## 2022-05-20 LAB — HEMOGLOBIN A1C
Hemoglobin A1C: 7.1 % — ABNORMAL HIGH (ref 4.8–5.6)
eAG: 157 mg/dL

## 2022-05-26 ENCOUNTER — Encounter: Admit: 2022-05-26 | Discharge: 2022-05-26 | Payer: BLUE CROSS/BLUE SHIELD | Attending: Medical | Primary: Medical

## 2022-05-26 DIAGNOSIS — E1140 Type 2 diabetes mellitus with diabetic neuropathy, unspecified: Secondary | ICD-10-CM

## 2022-05-26 LAB — TSH: TSH, 3RD GENERATION: 2.44 u[IU]/mL (ref 0.358–3.740)

## 2022-05-26 MED ORDER — TRIAMCINOLONE ACETONIDE 0.1 % MT PSTE
0.1 % | OROMUCOSAL | 1 refills | Status: AC
Start: 2022-05-26 — End: 2022-06-02

## 2022-05-26 MED ORDER — LOSARTAN POTASSIUM 50 MG PO TABS
50 MG | ORAL_TABLET | Freq: Every day | ORAL | 1 refills | Status: DC
Start: 2022-05-26 — End: 2022-11-17

## 2022-05-26 MED ORDER — ESTRADIOL 10 MCG VA TABS
10 MCG | ORAL_TABLET | VAGINAL | 3 refills | Status: AC
Start: 2022-05-26 — End: 2022-11-17

## 2022-05-26 MED ORDER — GABAPENTIN 300 MG PO CAPS
300 MG | ORAL_CAPSULE | ORAL | 1 refills | Status: AC
Start: 2022-05-26 — End: 2022-11-24

## 2022-05-26 MED ORDER — TRESIBA FLEXTOUCH 100 UNIT/ML SC SOPN
100 UNIT/ML | Freq: Every day | SUBCUTANEOUS | 5 refills | Status: AC
Start: 2022-05-26 — End: 2022-05-30

## 2022-05-26 MED ORDER — FARXIGA 5 MG PO TABS
5 MG | ORAL_TABLET | Freq: Every morning | ORAL | 1 refills | Status: AC
Start: 2022-05-26 — End: 2023-03-20

## 2022-05-26 NOTE — Progress Notes (Signed)
Christy Cochran (DOB: July 30, 1956) is a 66 y.o. female, an established patient, is here for evaluation of the following chief complaint(s):  Chief Complaint   Patient presents with    Diabetes    Results    Mouth Lesions          ASSESSMENT/PLAN:  Christy Cochran was seen today for diabetes, results and mouth lesions.    Diagnoses and all orders for this visit:    Type 2 diabetes mellitus with diabetic neuropathy, without long-term current use of insulin (HCC)  -     FARXIGA 5 MG tablet; Take 1 tablet by mouth every morning  -     Insulin Degludec (TRESIBA FLEXTOUCH) 100 UNIT/ML SOPN; Inject 2-5 Units into the skin daily    Vitamin D deficiency    Dyslipidemia    Neuropathy  -     gabapentin (NEURONTIN) 300 MG capsule; TAKE 1 CASPULE BY MOUTH TWICE DAILY    Fibromyalgia  -     gabapentin (NEURONTIN) 300 MG capsule; TAKE 1 CASPULE BY MOUTH TWICE DAILY    Hot flashes due to menopause  -     TSH; Future    Postmenopausal atrophic vaginitis  -     Estradiol (VAGIFEM) 10 MCG TABS vaginal tablet; Insert PV daily x 1 week, then twice weekly thereafter    Tongue ulcer  -     triamcinolone acetonide (KENALOG) 0.1 % paste; Apply after eating until ulcer is resolved.    Encounter for immunization  -     Influenza, FLUAD, (age 59 y+), IM, PF, 0.5 mL  -     PR ADMIN INFLUENZA VIRUS VAC    Other orders  -     losartan (COZAAR) 50 MG tablet; Take 1 tablet by mouth daily      I reviewed recent lab results w/  Christy Cochran.      Vit D level is treated to goal.  Continue Vit D2 50,000 once weekly.      BP today is: well controlled.  Currently prescribed:   Key Anti-Hypertensive Meds            hydroCHLOROthiazide (HYDRODIURIL) 25 MG tablet    Sig: TAKE 1 TABLET BY MOUTH EVERY DAY    losartan (COZAAR) 50 MG tablet    Sig - Route: Take 1 tablet by mouth daily - Oral   Taking Losartan for renal protection.    Taking HCTZ for Meniere's disease.   Symptoms are stable.      Diabetes is improving and almost to goal.  Her HgA1C is down to 7.1% since  last June.  Currently prescribed:   Key Antihyperglycemic Medications            TRULICITY 4.5 MG/0.5ML SOPN (Taking)    Sig: INJECT 0.5 mg weekly UNDER THE SKIN    Insulin Degludec (TRESIBA FLEXTOUCH) 100 UNIT/ML SOPN (Taking)    Sig: Take 10 units daily, can increase dose by 2 U every Mon and Friday until FBS are treated to goal.  Max daily dose= 20 units    FARXIGA 5 MG tablet (Taking)    Sig - Route: Take 1 tablet by mouth every morning - Oral    metFORMIN (GLUCOPHAGE-XR) 500 MG extended release tablet    Sig - Route: Take 1 tablet by mouth daily (with breakfast) - Oral    Patient not taking: Reported on 05/26/2022        She is no longer taking Metformin.  Will continue to  work on a low glycemic diet.     Taking gabapentin 300 BID for FMS.      Voltaren Gel OTC prn muscular pains, typically in neck/shoulders.     RLS symptoms controlled w/ Mirapex.      She is complaining about hot flashes.  Will send to lab today to check a TSH and if normal, she can try OTC Estroven to see if this helps.     She is also complaining about postmenopausal vaginal dryness.  Prescribed Vaginal Estradiol tabs twice weekly and she can supplement this as needed w/ OTC Replens moisturizer (for external use).     Follow Up    Please send to lab today for bloodwork  Please resend request for records from [facility]--need copy of last eye exam visit notes. (Previously requested)  6 mos DM mgt w/ labs prior to visit    SUBJECTIVE/OBJECTIVE:  HPI    At her visit On 11/25/21, the following was discussed:     Vitamin D level is low/normal.  Currently prescribed Vitamin D2 50,000 iu weekly.  She reports that she has not been taking this regularly and will restart.     Diabetes is poorly controlled.  Current HgA1C is now 8.0%.    --She's been having trouble getting Trulicity and was out of this medication x 2 months.  She is able to get it now, by changing pharmacies.  She will also work on a low glycemic diet.     She has dry skin w/  callus formation and a split in the skin on the bottom of her left foot.  Prescribed Amlactin cream to help w/ this.    Will be seeing ENT next week due to new dev't of blisters and sores in her nose.  This started around May of last year.     At today's visit:     Stopped taking Metformin a month ago    Painful ulcers in mouth past 8 days.      Vitals:    05/26/22 0902   BP: 130/80   Pulse: 60   Resp: 16   Temp: 97.9 F (36.6 C)   TempSrc: Oral   SpO2: 100%   Weight: 157 lb 6.4 oz (71.4 kg)   Height: 5\' 4"  (1.626 m)              Orders Placed This Encounter    Influenza, FLUAD, (age 58 y+), IM, PF, 0.5 mL    TSH     Standing Status:   Future     Standing Expiration Date:   06/25/2022    PR ADMIN INFLUENZA VIRUS VAC    gabapentin (NEURONTIN) 300 MG capsule     Sig: TAKE 1 CASPULE BY MOUTH TWICE DAILY     Dispense:  180 capsule     Refill:  1    triamcinolone acetonide (KENALOG) 0.1 % paste     Sig: Apply after eating until ulcer is resolved.     Dispense:  5 g     Refill:  1    FARXIGA 5 MG tablet     Sig: Take 1 tablet by mouth every morning     Dispense:  90 tablet     Refill:  1    Insulin Degludec (TRESIBA FLEXTOUCH) 100 UNIT/ML SOPN     Sig: Inject 2-5 Units into the skin daily     Dispense:  3 Adjustable Dose Pre-filled Pen Syringe     Refill:  5  Please place rx on file until pt. Requests refill.    losartan (COZAAR) 50 MG tablet     Sig: Take 1 tablet by mouth daily     Dispense:  90 tablet     Refill:  1    Estradiol (VAGIFEM) 10 MCG TABS vaginal tablet     Sig: Insert PV daily x 1 week, then twice weekly thereafter     Dispense:  31 tablet     Refill:  3         An electronic signature was used to authenticate this note.  -- Arturo Morton, PA

## 2022-05-29 NOTE — Telephone Encounter (Signed)
-----   Message from [place], [place] sent at 05/29/2022 12:30 PM EDT -----  Please let her know that her TSH is WNL.

## 2022-05-30 ENCOUNTER — Encounter

## 2022-05-30 MED ORDER — TRESIBA FLEXTOUCH 100 UNIT/ML SC SOPN
100 UNIT/ML | Freq: Every day | SUBCUTANEOUS | 5 refills | Status: AC
Start: 2022-05-30 — End: 2022-11-17

## 2022-05-30 NOTE — Telephone Encounter (Signed)
Pt is requesting a refill on insulin degludec 100 unit      Pharmacy [facility] [place], [place] [facility]

## 2022-05-31 ENCOUNTER — Telehealth

## 2022-05-31 MED ORDER — INSULIN PEN NEEDLE 32G X 4 MM MISC
3 refills | Status: AC
Start: 2022-05-31 — End: 2023-03-20

## 2022-05-31 NOTE — Telephone Encounter (Signed)
Pt is requesting a refill for pen needles that is called surecomfort pen needles.     Pt verbalized that ED order it for pt.    Pharmacy [facility] [place], [place]

## 2022-05-31 NOTE — Telephone Encounter (Signed)
Prescription filled per pt request.  Please notify pt.

## 2022-05-31 NOTE — Telephone Encounter (Signed)
Patient notified via voice mail

## 2022-07-18 ENCOUNTER — Encounter: Payer: Self-pay | Admitting: Gastroenterology

## 2022-08-01 ENCOUNTER — Inpatient Hospital Stay: Admit: 2022-08-01 | Payer: BLUE CROSS/BLUE SHIELD | Primary: Medical

## 2022-08-01 DIAGNOSIS — Z1231 Encounter for screening mammogram for malignant neoplasm of breast: Secondary | ICD-10-CM

## 2022-08-01 DIAGNOSIS — M810 Age-related osteoporosis without current pathological fracture: Secondary | ICD-10-CM

## 2022-08-01 NOTE — Other (Signed)
Appt scheduled for 1/5 w/ Larena Glassman

## 2022-08-10 ENCOUNTER — Encounter

## 2022-08-15 NOTE — Telephone Encounter (Signed)
Please forward to Gwenette Greet who sees her.

## 2022-08-15 NOTE — Telephone Encounter (Signed)
Patient had mammogram and bone density done 2 weeks ago.  She would like the results.

## 2022-08-16 NOTE — Telephone Encounter (Signed)
Please call Chrys Racer.  She had a Bone Density test done on 08/01/22 and the results have not been finalized yet. I will notify patient once I have the results.     Please let the patient know that [facility] is wanting her to return for additional breast imaging.  Please ask her to call [facility] at [phone]  to schedule recommended additional imaging.

## 2022-08-17 NOTE — Telephone Encounter (Signed)
I spoke with patient who voices understanding and acceptance of this advice and will call back if any further questions or concerns.

## 2022-09-01 ENCOUNTER — Encounter
Admit: 2022-09-01 | Discharge: 2022-09-01 | Payer: BLUE CROSS/BLUE SHIELD | Attending: Geriatric Medicine | Primary: Medical

## 2022-09-01 DIAGNOSIS — M810 Age-related osteoporosis without current pathological fracture: Secondary | ICD-10-CM

## 2022-09-01 NOTE — Assessment & Plan Note (Signed)
Asymptomatic, continue current medications

## 2022-09-01 NOTE — Progress Notes (Signed)
Christy Cochran (DOB: 1955/11/02) is a 67 y.o. female, established patient, here for evaluation of the following chief complaint(s):   No chief complaint on file.       ASSESSMENT/PLAN:  1. Age-related osteoporosis without current pathological fracture  2. Type 2 diabetes mellitus with diabetic neuropathy, without long-term current use of insulin (HCC)  Assessment & Plan:   Asymptomatic, continue current medications    Seen today for virtual to discuss DEXA bone density scan, patient has been diagnosed with osteoporosis for over 20 years, T-score was -2.6, patient did take Fosamax years ago, unsure of when she stopped or how long she took it, patient has upcoming dental implant work in the next few weeks,  As well as a repeat mammogram and ultrasound for an abnormal mammogram,  Would encourage patient to do weightbearing exercise, walking 30 minutes 5 times a week, can also wear a weight vest while walking,  Maintaining her weight would be advisable,  Would encourage her to continue her ergocalciferol 50,000 weekly, as well as adding a Caltrate chocolate chew/supplement with calcium supplementation,  Patient to follow-up with Christy Cochran for regular scheduled visit in March        SUBJECTIVE/OBJECTIVE:  HPI  See above  Review of Systems           No data to display                Physical Exam  Constitutional:       General: She is not in acute distress.     Appearance: Normal appearance. She is not ill-appearing.   HENT:      Head: Normocephalic and atraumatic.      Right Ear: External ear normal.      Left Ear: External ear normal.      Nose: Nose normal. No congestion or rhinorrhea.      Mouth/Throat:      Mouth: Mucous membranes are dry.   Eyes:      Extraocular Movements: Extraocular movements intact.      Pupils: Pupils are equal, round, and reactive to light.   Cardiovascular:      Pulses: Normal pulses.   Pulmonary:      Effort: Pulmonary effort is normal.   Musculoskeletal:         General: Normal  range of motion.      Cervical back: Normal range of motion and neck supple.   Skin:     General: Skin is warm and dry.   Neurological:      General: No focal deficit present.      Mental Status: She is alert and oriented to person, place, and time.   Psychiatric:         Mood and Affect: Mood normal.         On this date 09/01/22  I have spent 20 minutes reviewing previous notes, test results and face to face with the patient discussing the diagnosis and importance of compliance with the treatment plan as well as documenting on the day of the visit.           Christy Cochran is being evaluated by a Virtual Visit (video visit) encounter to address concerns as mentioned above.  A caregiver was present when appropriate. Due to this being a Scientist, physiological (During HYQMV-78 public health emergency), evaluation of the following organ systems was limited: Vitals/Constitutional/EENT/Resp/CV/GI/GU/MS/Neuro/Skin/Heme-Lymph-Imm.  Pursuant to the emergency declaration under the [place], 1135 waiver authority and  the Coronavirus Preparedness and Response Supplemental Appropriations Act, this Virtual Visit was conducted with patient's (and/or legal guardian's) consent, to reduce the patient's risk of exposure to COVID-19 and provide necessary medical care.  The patient (and/or legal guardian) has also been advised to contact this office for worsening conditions or problems, and seek emergency medical treatment and/or call 911 if deemed necessary.    Patient identification was verified at the start of the visit: Yes    Services were provided through a video synchronous discussion virtually to substitute for in-person clinic visit. Patient and provider were located at their individual homes.    An electronic signature was used to authenticate this note.  -- Ara Kussmaul, MD

## 2022-09-04 ENCOUNTER — Inpatient Hospital Stay: Admit: 2022-09-04 | Payer: BLUE CROSS/BLUE SHIELD | Primary: Medical

## 2022-09-04 DIAGNOSIS — R928 Other abnormal and inconclusive findings on diagnostic imaging of breast: Secondary | ICD-10-CM

## 2022-09-13 ENCOUNTER — Encounter: Payer: Self-pay | Admitting: Gastroenterology

## 2022-10-05 ENCOUNTER — Encounter

## 2022-11-05 ENCOUNTER — Encounter

## 2022-11-06 NOTE — Telephone Encounter (Signed)
Pramiprexole 0.125 mg take 3 tablets at night requested by the [facility] in [place].  Per the provider, Arturo Morton, PA, patients need to call for refills.

## 2022-11-10 ENCOUNTER — Encounter: Admit: 2022-11-10 | Discharge: 2022-11-10 | Payer: BLUE CROSS/BLUE SHIELD | Primary: Medical

## 2022-11-10 DIAGNOSIS — E1140 Type 2 diabetes mellitus with diabetic neuropathy, unspecified: Secondary | ICD-10-CM

## 2022-11-10 LAB — LIPID PANEL
Chol/HDL Ratio: 4.4
Cholesterol, Total: 296 MG/DL — ABNORMAL HIGH (ref <200)
HDL: 67 MG/DL — ABNORMAL HIGH (ref 40–60)
LDL Calculated: 185.2 MG/DL — ABNORMAL HIGH (ref <100)
Triglycerides: 219 MG/DL — ABNORMAL HIGH (ref 35–150)
VLDL Cholesterol Calculated: 43.8 MG/DL — ABNORMAL HIGH (ref 6.0–23.0)

## 2022-11-10 LAB — COMPREHENSIVE METABOLIC PANEL
ALT: 32 U/L (ref 12–65)
AST: 16 U/L (ref 15–37)
Albumin/Globulin Ratio: 1.4 (ref 0.4–1.6)
Albumin: 3.6 g/dL (ref 3.2–4.6)
Alk Phosphatase: 110 U/L (ref 50–136)
Anion Gap: 5 mmol/L (ref 2–11)
BUN: 19 MG/DL (ref 8–23)
CO2: 28 mmol/L (ref 21–32)
Calcium: 8.9 MG/DL (ref 8.3–10.4)
Chloride: 108 mmol/L (ref 103–113)
Creatinine: 1 MG/DL (ref 0.6–1.0)
Est, Glom Filt Rate: >60 mL/min/{1.73_m2} (ref >60)
Globulin: 2.5 g/dL — ABNORMAL LOW (ref 2.8–4.5)
Glucose: 156 mg/dL — ABNORMAL HIGH (ref 65–100)
Potassium: 4 mmol/L (ref 3.5–5.1)
Sodium: 141 mmol/L (ref 136–146)
Total Bilirubin: 0.4 MG/DL (ref 0.2–1.1)
Total Protein: 6.1 g/dL — ABNORMAL LOW (ref 6.3–8.2)

## 2022-11-10 LAB — MICROALBUMIN / CREATININE URINE RATIO
Creatinine, Ur: 91 mg/dL
Microalbumin Creatinine Ratio: 12 mg/g (ref 0–30)
Microalbumin, Random Urine: 1.07 MG/DL

## 2022-11-11 LAB — HEMOGLOBIN A1C
Estimated Avg Glucose: 117 mg/dL
Hemoglobin A1C: 5.7 % — ABNORMAL HIGH (ref 4.8–5.6)

## 2022-11-14 ENCOUNTER — Telehealth

## 2022-11-14 MED ORDER — PRAMIPEXOLE DIHYDROCHLORIDE 0.125 MG PO TABS
0.125 MG | ORAL_TABLET | ORAL | 0 refills | Status: AC
Start: 2022-11-14 — End: 2022-11-17

## 2022-11-14 NOTE — Telephone Encounter (Signed)
Medication Refill Request    Name of Medication :   pramipexole (MIRAPEX)     Strength of Medication: 0.125 MG tablet     Directions:        Sig: TAKE 3 TABLETS BY MOUTH EVERY HS       Preferred Pharmacy: [id number] - [place], [place] - [address] Appt. Date: 05/27/2023    Next Appt. Date: 11/17/2022    Additional Information For Provider:

## 2022-11-14 NOTE — Telephone Encounter (Signed)
Requested Rx sent to the pharmacy and patient was notified via voice mail.

## 2022-11-14 NOTE — Telephone Encounter (Signed)
Prescription filled per pt request.  Please notify pt.

## 2022-11-17 ENCOUNTER — Ambulatory Visit: Admit: 2022-11-17 | Discharge: 2022-11-17 | Payer: BLUE CROSS/BLUE SHIELD | Attending: Medical | Primary: Medical

## 2022-11-17 DIAGNOSIS — E1140 Type 2 diabetes mellitus with diabetic neuropathy, unspecified: Secondary | ICD-10-CM

## 2022-11-17 MED ORDER — LOSARTAN POTASSIUM 50 MG PO TABS
50 | ORAL_TABLET | Freq: Every day | ORAL | 1 refills | Status: DC
Start: 2022-11-17 — End: 2023-03-20

## 2022-11-17 MED ORDER — HYDROCHLOROTHIAZIDE 25 MG PO TABS
25 | ORAL_TABLET | ORAL | 1 refills | Status: DC
Start: 2022-11-17 — End: 2023-03-20

## 2022-11-17 MED ORDER — VENLAFAXINE HCL ER 150 MG PO CP24
150 MG | ORAL_CAPSULE | Freq: Every day | ORAL | 0 refills | Status: AC
Start: 2022-11-17 — End: 2022-12-29

## 2022-11-17 MED ORDER — GABAPENTIN 300 MG PO CAPS
300 | ORAL_CAPSULE | ORAL | 1 refills | Status: DC
Start: 2022-11-17 — End: 2023-03-20

## 2022-11-17 MED ORDER — TRULICITY 3 MG/0.5ML SC SOPN
30.5 MG/0.5ML | SUBCUTANEOUS | 5 refills | Status: AC
Start: 2022-11-17 — End: 2022-12-29

## 2022-11-17 MED ORDER — ROSUVASTATIN CALCIUM 40 MG PO TABS
40 | ORAL_TABLET | ORAL | 1 refills | Status: DC
Start: 2022-11-17 — End: 2023-03-20

## 2022-11-17 MED ORDER — PRAMIPEXOLE DIHYDROCHLORIDE 0.125 MG PO TABS
0.125 | ORAL_TABLET | ORAL | 1 refills | Status: DC
Start: 2022-11-17 — End: 2023-03-20

## 2022-11-17 MED ORDER — OMEPRAZOLE 40 MG PO CPDR
40 | ORAL_CAPSULE | Freq: Every day | ORAL | 1 refills | Status: DC
Start: 2022-11-17 — End: 2023-03-20

## 2022-11-17 MED ORDER — VITAMIN D (ERGOCALCIFEROL) 1.25 MG (50000 UT) PO CAPS
1.25 | ORAL_CAPSULE | ORAL | 1 refills | Status: DC
Start: 2022-11-17 — End: 2023-03-20

## 2022-11-17 MED ORDER — ESTRADIOL 0.1 MG/GM VA CREA
0.1 | VAGINAL | 11 refills | Status: DC
Start: 2022-11-17 — End: 2023-09-13

## 2022-11-17 NOTE — Progress Notes (Unsigned)
Christy Cochran (DOB: 16-May-1956) is a 67 y.o. female, an established patient, is here for evaluation of the following chief complaint(s):  No chief complaint on file.         ASSESSMENT/PLAN:  Diagnoses and all orders for this visit:    Type 2 diabetes mellitus with diabetic neuropathy, without long-term current use of insulin (HCC)    Age-related osteoporosis without current pathological fracture    Dyslipidemia    Fibromyalgia    Restless leg syndrome    Vitamin D deficiency    Hot flashes due to menopause    Postmenopausal atrophic vaginitis        Seen by ortho on 10/31/22 for rt. Knee arthritis.  Started on Diclofenac po & injection administered.      Taking gabapentin 300 BID for FMS.       Voltaren Gel OTC prn muscular pains, typically in neck/shoulders.      RLS symptoms controlled w/ Mirapex.     Follow Up    No follow-ups on file.     SUBJECTIVE/OBJECTIVE:  HPI    At her visit On 09/01/22, the following was discussed w/ Dr. Lacretia Nicks:     Pt diagnosed with osteoporosis for over 20 years, T-score was -2.6, patient did take Fosamax years ago, unsure of when she stopped or how long she took it, patient has upcoming dental implant work in the next few weeks     At her visit On 05/26/22, the following was discussed:     Diabetes is improving and almost to goal.  Her HgA1C is down to 7.1% since last June.   --She is no longer taking Metformin.  Will continue to work on a low glycemic diet.     She is complaining about hot flashes. Will send to lab today to check a TSH and if normal, she can try OTC Estroven to see if this helps.     She is also complaining about postmenopausal vaginal dryness. Prescribed Vaginal Estradiol tabs twice weekly and she can supplement this as needed w/ OTC Replens moisturizer (for external use).     At today's visit:     ***    There were no vitals filed for this visit.           No orders of the defined types were placed in this encounter.        An electronic signature was used to authenticate  this note.  -- Linus Orn, PA     Part of this note was written by using a voice dictation software. The note has been proof read but may still contain some grammatical/other typographical errors.

## 2022-11-17 NOTE — Addendum Note (Signed)
Addended by: Arturo Morton on: 11/17/2022 09:58 AM     Modules accepted: Orders

## 2022-12-29 ENCOUNTER — Ambulatory Visit: Admit: 2022-12-29 | Discharge: 2022-12-29 | Payer: BLUE CROSS/BLUE SHIELD | Attending: Medical | Primary: Medical

## 2022-12-29 DIAGNOSIS — F411 Generalized anxiety disorder: Secondary | ICD-10-CM

## 2022-12-29 MED ORDER — VENLAFAXINE HCL ER 150 MG PO CP24
150 | ORAL_CAPSULE | Freq: Every day | ORAL | 0 refills | Status: DC
Start: 2022-12-29 — End: 2023-03-20

## 2022-12-29 MED ORDER — DOXYCYCLINE HYCLATE 100 MG PO TABS
100 | ORAL_TABLET | Freq: Two times a day (BID) | ORAL | 0 refills | Status: AC
Start: 2022-12-29 — End: 2023-01-08

## 2022-12-29 MED ORDER — MUPIROCIN 2 % EX OINT
2 | CUTANEOUS | 0 refills | Status: AC
Start: 2022-12-29 — End: 2023-01-05

## 2022-12-29 MED ORDER — TRULICITY 3 MG/0.5ML SC SOPN
30.5 MG/0.5ML | SUBCUTANEOUS | 2 refills | Status: DC
Start: 2022-12-29 — End: 2023-02-15

## 2022-12-29 NOTE — Progress Notes (Signed)
Christy Cochran (DOB: Dec 22, 1955) is a 67 y.o. female, an established patient, is here for evaluation of the following chief complaint(s):  Chief Complaint   Patient presents with    Anxiety    Sinus Problem          ASSESSMENT/PLAN:  Christy Cochran was seen today for anxiety and sinus problem.    Diagnoses and all orders for this visit:    Anxiety state  -     venlafaxine (EFFEXOR XR) 150 MG extended release capsule; Take 1 capsule by mouth daily    Fibromyalgia    Restless leg syndrome    Postmenopausal atrophic vaginitis    Hot flashes due to menopause    Acute bacterial sinusitis  -     doxycycline hyclate (VIBRA-TABS) 100 MG tablet; Take 1 tablet by mouth 2 times daily for 10 days    Intranasal ulcer  -     mupirocin (BACTROBAN) 2 % ointment; Apply intranasally BID x 5 days    Type 2 diabetes mellitus with diabetic neuropathy, without long-term current use of insulin (HCC)  -     Dulaglutide (TRULICITY) 3 MG/0.5ML SOPN; Inject 3 mg into the skin once a week        She has not tried OTC Estroven for hot flashes yet, but plans to do so.     Voltaren Gel OTC prn muscular pains, typically in neck/shoulders.      RLS symptoms controlled w/ Mirapex.     Follow Up    Return for F/u as scheduled in [place].     SUBJECTIVE/OBJECTIVE:  HPI    At her visit On 11/17/22, the following was discussed:     She is experiencing increased marital stress. Will increase Effexor to 150 mg daily and re-eval in 4-6 weeks.     Will change vagifem tabs to a estradiol cream due to vaginal dryness.     She has not tried OTC Estroven for hot flashes yet, but plans to do so.     Diabetes is overtreated.  Her HgA1C is down to 5.7%.   --She was out of Trulicity for 6 weeks since last visit. She does report that she may have experienced some hypoglycemic events Hasn't been taking [place], so will d/c at this time. Will reduce Trulicity dose to 3.0 mg/week.     The 10-year ASCVD risk score (Arnett DK, et al., 2019) is: 13.4%   --Will increase rosuvastatin  to 40 mg for improved CV risk reduction.     Seen by ortho on 10/31/22 for rt. Knee arthritis.  Started on Diclofenac po & injection administered.  Knee pain is still a problem and not very much improved.    --knee pain improved.      At today's visit:     Anxiety/Stress Follow-up--higher dose of Effexor is working well for stress/anxiety.      Estradiol cream is helping w/ vaginal dryness.      Sinus pain and pressure with yellow/bloody mucus when blowing nose symptoms onset 3 weeks ago.  Patient checking blood pressure 114    Vitals:    12/29/22 1057   BP: 114/70   Pulse: 62   Resp: 14   Temp: 97.9 F (36.6 C)   TempSrc: Oral   SpO2: 98%   Weight: 74.4 kg (164 lb)   Height: 1.626 m (5\' 4" )              Orders Placed This Encounter    mupirocin (BACTROBAN)  2 % ointment     Sig: Apply intranasally BID x 5 days     Dispense:  15 g     Refill:  0    doxycycline hyclate (VIBRA-TABS) 100 MG tablet     Sig: Take 1 tablet by mouth 2 times daily for 10 days     Dispense:  20 tablet     Refill:  0    venlafaxine (EFFEXOR XR) 150 MG extended release capsule     Sig: Take 1 capsule by mouth daily     Dispense:  90 capsule     Refill:  0     Please place rx on file until pt. Requests refill.    Dulaglutide (TRULICITY) 3 MG/0.5ML SOPN     Sig: Inject 3 mg into the skin once a week     Dispense:  2 mL     Refill:  2         An electronic signature was used to authenticate this note.  -- Linus Orn, PA     Part of this note was written by using a voice dictation software. The note has been proof read but may still contain some grammatical/other typographical errors.

## 2023-02-03 ENCOUNTER — Encounter

## 2023-02-14 ENCOUNTER — Encounter

## 2023-02-15 ENCOUNTER — Encounter

## 2023-02-15 MED ORDER — TRULICITY 3 MG/0.5ML SC SOPN
30.5 MG/0.5ML | SUBCUTANEOUS | 2 refills | Status: AC
Start: 2023-02-15 — End: 2023-03-20

## 2023-02-15 NOTE — Telephone Encounter (Signed)
Patient called request a refill on the following medication:   Dulaglutide (TRULICITY) 3 MG/0.5ML SOPN     [id number] - [place], [place] - [address] [phone] Carmon Ginsberg [phone]     Provider Information:    Pt last office visit: 12/29/2022  Pt follow up appointment: 03/20/2023      Dulaglutide (TRULICITY) 3 MG/0.5ML SOPN     Refills: 2           Sig: Inject 3 mg into the skin once a week         Start Date: 12/29/22 End Date: --   Written Date: 12/29/22 Expiration Date: 12/29/23

## 2023-02-15 NOTE — Telephone Encounter (Signed)
Prescription filled per pt request.  Please notify pt.

## 2023-02-15 NOTE — Telephone Encounter (Signed)
Requested medication sent to the pharmacy and patient was notified.

## 2023-02-19 ENCOUNTER — Encounter

## 2023-03-14 ENCOUNTER — Other Ambulatory Visit: Admit: 2023-03-14 | Discharge: 2023-03-14 | Payer: BLUE CROSS/BLUE SHIELD | Primary: Medical

## 2023-03-14 DIAGNOSIS — E559 Vitamin D deficiency, unspecified: Secondary | ICD-10-CM

## 2023-03-14 LAB — COMPREHENSIVE METABOLIC PANEL
ALT: 30 U/L (ref 12–65)
AST: 24 U/L (ref 15–37)
Albumin/Globulin Ratio: 1.4 (ref 1.0–1.9)
Albumin: 3.5 g/dL (ref 3.2–4.6)
Alk Phosphatase: 92 U/L (ref 35–104)
Anion Gap: 11 mmol/L (ref 9–18)
BUN: 15 MG/DL (ref 8–23)
CO2: 23 mmol/L (ref 20–28)
Calcium: 8.9 MG/DL (ref 8.8–10.2)
Chloride: 108 mmol/L — ABNORMAL HIGH (ref 98–107)
Creatinine: 0.95 MG/DL (ref 0.60–1.10)
Est, Glom Filt Rate: 66 mL/min/{1.73_m2} (ref >60)
Globulin: 2.4 g/dL (ref 2.3–3.5)
Glucose: 221 mg/dL — ABNORMAL HIGH (ref 70–99)
Potassium: 3.8 mmol/L (ref 3.5–5.1)
Sodium: 143 mmol/L (ref 136–145)
Total Bilirubin: <0.2 MG/DL (ref 0.0–1.2)
Total Protein: 5.9 g/dL — ABNORMAL LOW (ref 6.3–8.2)

## 2023-03-14 LAB — LIPID PANEL
Chol/HDL Ratio: 5.3 — ABNORMAL HIGH (ref 0.0–5.0)
Cholesterol, Total: 304 MG/DL — ABNORMAL HIGH (ref 0–200)
HDL: 58 MG/DL (ref 40–60)
LDL Cholesterol: 190 MG/DL — ABNORMAL HIGH (ref 0–100)
Triglycerides: 281 MG/DL — ABNORMAL HIGH (ref 0–150)
VLDL Cholesterol Calculated: 56 MG/DL — ABNORMAL HIGH (ref 6–23)

## 2023-03-14 LAB — HEMOGLOBIN A1C
Estimated Avg Glucose: 263 mg/dL
Hemoglobin A1C: 10.8 % — ABNORMAL HIGH (ref 0–5.6)

## 2023-03-14 LAB — VITAMIN D 25 HYDROXY: Vit D, 25-Hydroxy: 32.8 ng/mL (ref 30.0–100.0)

## 2023-03-14 LAB — MICROALBUMIN / CREATININE URINE RATIO
Creatinine, Ur: 70.5 mg/dL (ref 28.00–217.00)
Microalb, Ur: <1.2 MG/DL (ref 0.00–20.00)

## 2023-03-20 ENCOUNTER — Ambulatory Visit: Admit: 2023-03-20 | Discharge: 2023-03-20 | Payer: BLUE CROSS/BLUE SHIELD | Attending: Medical | Primary: Medical

## 2023-03-20 DIAGNOSIS — E1140 Type 2 diabetes mellitus with diabetic neuropathy, unspecified: Secondary | ICD-10-CM

## 2023-03-20 LAB — AMB POC URINALYSIS DIP STICK AUTO W/O MICRO
Blood (UA POC): NEGATIVE
Glucose, Urine, POC: >=1000
Leukocyte Esterase, Urine, POC: NEGATIVE
Nitrite, Urine, POC: NEGATIVE
Specific Gravity, Urine, POC: 1.025 (ref 1.001–1.035)
Urobilinogen, POC: 0.2
pH, Urine, POC: 6 (ref 4.6–8.0)

## 2023-03-20 MED ORDER — SEMAGLUTIDE (2 MG/DOSE) 8 MG/3ML SC SOPN
8 MG/3ML | SUBCUTANEOUS | 1 refills | Status: DC
Start: 2023-03-20 — End: 2023-06-13

## 2023-03-20 MED ORDER — ONDANSETRON 4 MG PO TBDP
4 MG | ORAL_TABLET | Freq: Three times a day (TID) | ORAL | 0 refills | Status: DC | PRN
Start: 2023-03-20 — End: 2023-06-13

## 2023-03-20 MED ORDER — GABAPENTIN 300 MG PO CAPS
300 MG | ORAL_CAPSULE | ORAL | 0 refills | Status: AC
Start: 2023-03-20 — End: 2023-09-18

## 2023-03-20 MED ORDER — HYDROCHLOROTHIAZIDE 25 MG PO TABS
25 MG | ORAL_TABLET | ORAL | 0 refills | Status: AC
Start: 2023-03-20 — End: 2023-06-13

## 2023-03-20 MED ORDER — LOSARTAN POTASSIUM 50 MG PO TABS
50 MG | ORAL_TABLET | Freq: Every day | ORAL | 0 refills | Status: AC
Start: 2023-03-20 — End: 2023-06-13

## 2023-03-20 MED ORDER — FARXIGA 5 MG PO TABS
5 MG | ORAL_TABLET | Freq: Every morning | ORAL | 1 refills | Status: AC
Start: 2023-03-20 — End: 2023-06-13

## 2023-03-20 MED ORDER — EXACTECH TEST VI STRP
3 refills | Status: DC
Start: 2023-03-20 — End: 2024-03-05

## 2023-03-20 MED ORDER — FLUTICASONE PROPIONATE 50 MCG/ACT NA SUSP
50 | Freq: Two times a day (BID) | NASAL | 11 refills | Status: DC
Start: 2023-03-20 — End: 2024-03-05

## 2023-03-20 MED ORDER — ROSUVASTATIN CALCIUM 40 MG PO TABS
40 MG | ORAL_TABLET | ORAL | 0 refills | Status: AC
Start: 2023-03-20 — End: 2023-06-13

## 2023-03-20 MED ORDER — VITAMIN D (ERGOCALCIFEROL) 1.25 MG (50000 UT) PO CAPS
1.2550000 MG (50000 UT) | ORAL_CAPSULE | ORAL | 0 refills | Status: AC
Start: 2023-03-20 — End: 2023-06-13

## 2023-03-20 MED ORDER — OMEPRAZOLE 40 MG PO CPDR
40 MG | ORAL_CAPSULE | Freq: Every day | ORAL | 0 refills | Status: DC
Start: 2023-03-20 — End: 2023-06-13

## 2023-03-20 MED ORDER — VENLAFAXINE HCL ER 150 MG PO CP24
150 MG | ORAL_CAPSULE | Freq: Every day | ORAL | 0 refills | Status: DC
Start: 2023-03-20 — End: 2023-06-13

## 2023-03-20 MED ORDER — PRAMIPEXOLE DIHYDROCHLORIDE 0.125 MG PO TABS
0.125 MG | ORAL_TABLET | ORAL | 0 refills | Status: DC
Start: 2023-03-20 — End: 2023-06-13

## 2023-03-20 NOTE — Progress Notes (Signed)
Christy Cochran (DOB: [birth year]) is a 67 y.o. female, an established patient, is here for evaluation of the following chief complaint(s):  Chief Complaint   Patient presents with    Diabetes    Results    Nausea    Urine odor        ASSESSMENT/PLAN:  Christy Cochran was seen today for diabetes, results, nausea and urine odor.    Diagnoses and all orders for this visit:    Type 2 diabetes mellitus with diabetic neuropathy, without long-term current use of insulin (HCC)  -     semaglutide, 2 MG/DOSE, (OZEMPIC) 8 MG/3ML SOPN sc injection; Inject 2 mg into the skin every 7 days  -     [facility] - [facility] Diabetic Education  -     FARXIGA 5 MG tablet; Take 1 tablet by mouth every morning    Vitamin D deficiency  -     vitamin D (ERGOCALCIFEROL) 1.25 MG (50000 UT) CAPS capsule; Take 1 capsule by mouth once a week    Fibromyalgia  -     gabapentin (NEURONTIN) 300 MG capsule; TAKE 1 CASPULE BY MOUTH TWICE DAILY    Restless leg syndrome  -     pramipexole (MIRAPEX) 0.125 MG tablet; TAKE 3 TABLETS BY MOUTH EVERY HS    Abnormal urine odor  -     AMB POC URINALYSIS DIP STICK AUTO W/O MICRO    Nausea  -     ondansetron (ZOFRAN-ODT) 4 MG disintegrating tablet; Take 1 tablet by mouth 3 times daily as needed for Nausea or Vomiting    Callus of foot  -     [facility] - Choudhary, Suboddh, DPM, [employer] [address], [place]    Seasonal allergic rhinitis due to pollen  -     fluticasone (FLONASE) 50 MCG/ACT nasal spray; 1 spray by Nasal route in the morning and at bedtime    Neuropathy  -     gabapentin (NEURONTIN) 300 MG capsule; TAKE 1 CASPULE BY MOUTH TWICE DAILY    Meniere's disease of both ears  -     hydroCHLOROthiazide (HYDRODIURIL) 25 MG tablet; TAKE 1 TABLET BY MOUTH EVERY DAY    Hypertension, essential  -     losartan (COZAAR) 50 MG tablet; Take 1 tablet by mouth daily    Gastroesophageal reflux disease without esophagitis  -     omeprazole (PRILOSEC) 40 MG delayed release capsule; Take 1 capsule by mouth daily    Dyslipidemia  -      rosuvastatin (CRESTOR) 40 MG tablet; TAKE 1 TABLET BY MOUTH EVERY DAY    Anxiety state  -     venlafaxine (EFFEXOR XR) 150 MG extended release capsule; Take 1 capsule by mouth daily    Other orders  -     blood glucose test strips (EXACTECH TEST) strip; Test BS 1-2 daily As needed.      I reviewed recent lab results w/  Christy Cochran.      Her vitamin D level is low/normal.  Currently recommended vitamin D2 50,000 IU weekly.  She has been taking as directed.  Will add Vit D3 4000 iu daily.      Diabetes is poorly controlled.  Her hemoglobin A1c has increased to 10.8 since last March.  Currently prescribed:   Diabetic Medications       Incretin Mimetic Agents       Dulaglutide (TRULICITY) 3 MG/0.5ML SOPN Inject 3 mg into the skin once a week  Sodium-Glucose Co-Transporter 2 (SGLT2) Inhibitors       FARXIGA 5 MG tablet Take 1 tablet by mouth every morning   She was out of Trulicity for about 6 weeks due to unavailability.  Will try switching to Ozempic.  Re-evaluate in 3 mos.  Will also refer to meet w/ a dietician.      The 10-year ASCVD risk score (Arnett DK, et al., 2019) is: 18.3%    Values used to calculate the score:      Age: 67 years      Sex: Female      Is Non-Hispanic African American: No      Diabetic: Yes      Tobacco smoker: No      Systolic Blood Pressure: 128 mmHg      Is BP treated: Yes      HDL Cholesterol: 58 MG/DL      Total Cholesterol: 304 MG/DL  Currently prescribed:   Lipid Lowering Medications       HMG CoA Reductase Inhibitors       rosuvastatin (CRESTOR) 40 MG tablet TAKE 1 TABLET BY MOUTH EVERY DAY   Pt to work on lifestyle changes and will recheck in 3 mos.      Renal function is normal.    BP today is: well controlled.   Currently prescribed:   Hypertension Medications       Thiazides and Thiazide-Like Diuretics       hydroCHLOROthiazide (HYDRODIURIL) 25 MG tablet TAKE 1 TABLET BY MOUTH EVERY DAY       Angiotensin II Receptor Antagonists       losartan (COZAAR) 50 MG tablet Take 1  tablet by mouth daily     UA today shows:    Results for orders placed or performed in visit on 03/20/23   AMB POC URINALYSIS DIP STICK AUTO W/O MICRO   Result Value Ref Range    Color (UA POC) Dark Yellow     Clarity (UA POC) Slightly Cloudy     Glucose, Urine, POC >=1000     Bilirubin, Urine, POC Small     KETONES, Urine, POC Trace     Specific Gravity, Urine, POC 1.025 1.001 - 1.035    Blood (UA POC) Negative     pH, Urine, POC 6.0 4.6 - 8.0    Protein, Urine, POC Trace     Urobilinogen, POC 0.2 mg/dL     Nitrite, Urine, POC Negative     Leukocyte Esterase, Urine, POC Negative    She has a lot of sugar in her urine.  Encouraged getting better control of her diabetes, which should improve her symptoms.  No abx needed at this time.     I am encouraging her to engage in counseling due to marital discord (list of counselors provided), and she will continue Effexor XR 150 mg daily for anxiety/stress.      Will also send in a prescription for Zofran to help with nausea, which she feels is also being caused by her ongoing stress.    Will refer to podiatry for eval and treatment of callus and skin flap on left foot.    She continues gabapentin twice daily for treatment of neuropathy.     RLS symptoms controlled w/ Mirapex      Follow Up    Return for 12 weeks DM Mgt w/ labs prior to visit.     SUBJECTIVE/OBJECTIVE:  HPI    At her visit On 12/29/22, the following was discussed:  She has not tried OTC Estroven for hot flashes yet, but plans to do so.     She is experiencing increased marital stress. Will increase Effexor to 150 mg daily and re-eval in 4-6 weeks.     At today's visit:     N/V--has had Nausea and no vomiting past 3-4 days.  (Had a stomach bug last week).  Also states that stress has also caused problems w/ nausea.      Milky looking urine with odor past 2 weeks.  Also having small amt of dysuria.     She continues to have a lot of stress related to her marriage.      Vitals:    03/20/23 0837   BP: 128/78    Pulse: 57   Resp: 14   Temp: 97.7 F (36.5 C)   TempSrc: Oral   SpO2: 95%   Weight: 73.1 kg (161 lb 3.2 oz)   Height: 1.626 m (5\' 4" )      Diabetic foot exam:   Left Foot:   Visual Exam: callus under 1st MTP joint w/ a flap present (increased risk of tearing)   Pulse DP: 2+ (normal)   Filament test: 6/6     Right Foot:   Visual Exam: Normal   Pulse DP: 2+ (normal)   Filament test: 6/6       Orders Placed This Encounter    [facility] - [facility] Diabetic Education     Referral Priority:   Routine     Referral Type:   Eval and Treat     Referral Reason:   Specialty Services Required     Number of Visits Requested:   1    [facility] - Choudhary, Suboddh, DPM, [employer], [place]     Referral Priority:   Routine     Referral Type:   Eval and Treat     Referral Reason:   Specialty Services Required     Referred to Provider:   Earlie Server, MD     Requested Specialty:   Podiatry     Number of Visits Requested:   1    AMB POC URINALYSIS DIP STICK AUTO W/O MICRO    ondansetron (ZOFRAN-ODT) 4 MG disintegrating tablet     Sig: Take 1 tablet by mouth 3 times daily as needed for Nausea or Vomiting     Dispense:  21 tablet     Refill:  0    semaglutide, 2 MG/DOSE, (OZEMPIC) 8 MG/3ML SOPN sc injection     Sig: Inject 2 mg into the skin every 7 days     Dispense:  9 mL     Refill:  1     Please d/c any orders on file for Trulicity    blood glucose test strips (EXACTECH TEST) strip     Sig: Test BS 1-2 daily As needed.     Dispense:  100 each     Refill:  3    FARXIGA 5 MG tablet     Sig: Take 1 tablet by mouth every morning     Dispense:  90 tablet     Refill:  1    fluticasone (FLONASE) 50 MCG/ACT nasal spray     Sig: 1 spray by Nasal route in the morning and at bedtime     Dispense:  16 g     Refill:  11     Please place rx on file until pt. Requests refill.    gabapentin (NEURONTIN) 300  MG capsule     Sig: TAKE 1 CASPULE BY MOUTH TWICE DAILY     Dispense:  180 capsule     Refill:  0    hydroCHLOROthiazide  (HYDRODIURIL) 25 MG tablet     Sig: TAKE 1 TABLET BY MOUTH EVERY DAY     Dispense:  90 tablet     Refill:  0    losartan (COZAAR) 50 MG tablet     Sig: Take 1 tablet by mouth daily     Dispense:  90 tablet     Refill:  0    omeprazole (PRILOSEC) 40 MG delayed release capsule     Sig: Take 1 capsule by mouth daily     Dispense:  90 capsule     Refill:  0    pramipexole (MIRAPEX) 0.125 MG tablet     Sig: TAKE 3 TABLETS BY MOUTH EVERY HS     Dispense:  270 tablet     Refill:  0    rosuvastatin (CRESTOR) 40 MG tablet     Sig: TAKE 1 TABLET BY MOUTH EVERY DAY     Dispense:  90 tablet     Refill:  0    venlafaxine (EFFEXOR XR) 150 MG extended release capsule     Sig: Take 1 capsule by mouth daily     Dispense:  90 capsule     Refill:  0     Please place rx on file until pt. Requests refill.    vitamin D (ERGOCALCIFEROL) 1.25 MG (50000 UT) CAPS capsule     Sig: Take 1 capsule by mouth once a week     Dispense:  12 capsule     Refill:  0         An electronic signature was used to authenticate this note.  -- Linus Orn, PA     Part of this note was written by using a voice dictation software. The note has been proof read but may still contain some grammatical/other typographical errors.

## 2023-03-20 NOTE — Telephone Encounter (Signed)
First call to pt regarding type 2 diabetes. Pt lives in [place]. Left message: free, we teach in [place] once/month and call back number: [phone].

## 2023-03-20 NOTE — Patient Instructions (Signed)
Area Counseling Services    [facility]  Celisa Patterson  [phone]  [address], [place], [place]  Does not bill insurance/private pay only--will provide you w/ a "SuperBill" to allow you to get reimbursement though your insurance.     [facility]  [phone] (24 hours daily)  Will file insurance for Medicaid, Medicare, worker's compensation and commercial health plans     [facility]  [phone] (free, night and weekends available also)     [facility]  Brenda Kay Trent  Locations in [place] and [place]  [phone]  (specializes in counseling for women and girls)     [facility] (located next door)  [address] [place] [address]     [facility]: -   Erin Canaros  [phone]     [facility]  Ashley Bischoff  [phone]     [facility]  Lynn Victory (depression/stress, eating disorders, body image etc)  [phone]     [facility]  Faye Slater (grief, stress management, marriage counseling)  Renee Potter (adolescents, school issues)  [phone]     [facility] (deals with history of sexual abuse and is free)  Address: [address], [place] [address]  [phone]     Robin McKenna  [phone] (bills some insurance)     [facility] (for families with addicted family members)  [email or web address]     Beth Zweigoron  [phone]  Takes medicare  Offers EMDR services (eye movement and desensitization and reprocessing treatment) -- very beneficial for history of traumatic events and PTSD     [facility]  [address], [place] [address]  Telephone:  [phone]     [facility]  [address], [place] [address]  [phone]     [facility]  [phone]  [address] [place] [address]      [facility]  Address: [address], [place] [address]  [phone]     [facility]  Address: [address], [place] [address]  [phone]        Eating Disorders:  [facility]  [address], [place] [address]  [phone]     Amber DeGarmo  [phone]  (specializes in eating disorders, including binge eating disorder)     Grief Counseling:  [facility]  Jessica Few   [phone]     [facility]  Rebecca Farmer  [phone]     [facility] (for all hurts, habits and hangups - from addiction to abuse to depression, etc)  [facility]  [address], [place], [place]   [place] [phone]  [place],  Contact Person: Jim Russell   Contact Number: [phone]      [facility]  [address] [place], [place]   [place] [phone]  [place],  Contact Person: Dan Elmore   Contact Number: [phone]

## 2023-03-20 NOTE — Telephone Encounter (Signed)
Pt left message back regarding diabetes referral. Called pt and scheduled all 4 classes in [place] location.

## 2023-03-30 ENCOUNTER — Encounter

## 2023-04-04 ENCOUNTER — Inpatient Hospital Stay: Payer: BLUE CROSS/BLUE SHIELD | Primary: Medical

## 2023-04-04 NOTE — Telephone Encounter (Signed)
No show for diabetes nutrition 1 class in [place] today. Called and left message asking for a return call to r/s.

## 2023-05-02 ENCOUNTER — Inpatient Hospital Stay: Payer: BLUE CROSS/BLUE SHIELD | Primary: Medical

## 2023-05-02 NOTE — Progress Notes (Signed)
 Outpatient Diabetes Education    Class Type: Diabetes Nutrition Overview Session 2    Participant attended nutrition 2 session today for clients with type 2 diabetes titled Understanding carbohydrate counting. Topics included: Review of what will raise blood sugars, type of carbohydrates, what is carbohydrate counting, understanding how many carbohydrates are in food, carbohydrate counting using a guide book, meal planning using carbohydrate counting, reading food labels, practice with reading food labels, understanding fats, sodium, barriers to healthy lifestyle changes/solutions, snack ideas, tips for snacking, special diets, grocery shopping tips, herbs and spices, recipe modification and resources. Written booklet/material provided.    Pt r/s for nutrition 1 in [place] for December.      Encouraged lifestyle modifications and follow up with primary care provider.    Certified Diabetes Care and Educational Specialist  [facility]

## 2023-05-14 ENCOUNTER — Encounter

## 2023-05-20 ENCOUNTER — Encounter

## 2023-05-21 NOTE — Telephone Encounter (Signed)
 Office does not accept pharmacy requests. Pt must contact office directly.

## 2023-05-30 NOTE — Telephone Encounter (Addendum)
 Type 2.  Call to patient to reschedule our free Diabetes Living and Coping One session that was held today in [place], [place].  Unable to speak to patient or leave a message. [phone] or [phone].

## 2023-06-05 ENCOUNTER — Encounter: Payer: BLUE CROSS/BLUE SHIELD | Primary: Medical

## 2023-06-12 ENCOUNTER — Other Ambulatory Visit: Admit: 2023-06-12 | Discharge: 2023-06-12 | Payer: BLUE CROSS/BLUE SHIELD | Primary: Medical

## 2023-06-12 DIAGNOSIS — E559 Vitamin D deficiency, unspecified: Secondary | ICD-10-CM

## 2023-06-13 ENCOUNTER — Encounter: Admit: 2023-06-13 | Discharge: 2023-06-13 | Payer: BLUE CROSS/BLUE SHIELD | Attending: Medical | Primary: Medical

## 2023-06-13 DIAGNOSIS — E1165 Type 2 diabetes mellitus with hyperglycemia: Secondary | ICD-10-CM

## 2023-06-13 DIAGNOSIS — E1140 Type 2 diabetes mellitus with diabetic neuropathy, unspecified: Secondary | ICD-10-CM

## 2023-06-13 LAB — COMPREHENSIVE METABOLIC PANEL
ALT: 37 U/L (ref 8–45)
AST: 25 U/L (ref 15–37)
Albumin/Globulin Ratio: 2 — ABNORMAL HIGH (ref 1.0–1.9)
Albumin: 4.6 g/dL (ref 3.2–4.6)
Alk Phosphatase: 77 U/L (ref 35–104)
Anion Gap: 13 mmol/L (ref 9–18)
BUN: 20 mg/dL (ref 8–23)
CO2: 28 mmol/L (ref 20–28)
Calcium: 9.7 mg/dL (ref 8.8–10.2)
Chloride: 98 mmol/L (ref 98–107)
Creatinine: 1.13 mg/dL — ABNORMAL HIGH (ref 0.60–1.10)
Est, Glom Filt Rate: 54 mL/min/{1.73_m2} — ABNORMAL LOW (ref >60)
Globulin: 2.3 g/dL (ref 2.3–3.5)
Glucose: 191 mg/dL — ABNORMAL HIGH (ref 70–99)
Potassium: 3.5 mmol/L (ref 3.5–5.1)
Sodium: 138 mmol/L (ref 136–145)
Total Bilirubin: 0.9 mg/dL (ref 0.0–1.2)
Total Protein: 6.9 g/dL (ref 6.3–8.2)

## 2023-06-13 LAB — MICROALBUMIN / CREATININE URINE RATIO
Creatinine, Ur: 97.4 mg/dL (ref 28.00–217.00)
Microalb, Ur: 1.84 mg/dL (ref 0.00–20.00)
Microalb/Creat Ratio: 19 mg/g (ref 0–30)

## 2023-06-13 LAB — VITAMIN D 25 HYDROXY: Vit D, 25-Hydroxy: 64.4 ng/mL (ref 30.0–100.0)

## 2023-06-13 LAB — LIPID PANEL
Chol/HDL Ratio: 2.3 (ref 0.0–5.0)
Cholesterol, Total: 162 mg/dL (ref 0–200)
HDL: 72 mg/dL — ABNORMAL HIGH (ref 40–60)
LDL Cholesterol: 61 mg/dL (ref 0–100)
Triglycerides: 147 mg/dL (ref 0–150)
VLDL Cholesterol Calculated: 29 mg/dL — ABNORMAL HIGH (ref 6–23)

## 2023-06-13 LAB — HEMOGLOBIN A1C
Estimated Avg Glucose: 181 mg/dL
Hemoglobin A1C: 7.9 % — ABNORMAL HIGH (ref 0–5.6)

## 2023-06-13 MED ORDER — SEMAGLUTIDE (2 MG/DOSE) 8 MG/3ML SC SOPN
8 | SUBCUTANEOUS | 0 refills | Status: AC
Start: 2023-06-13 — End: ?

## 2023-06-13 MED ORDER — VITAMIN D (ERGOCALCIFEROL) 1.25 MG (50000 UT) PO CAPS
1.25 | ORAL_CAPSULE | ORAL | 0 refills | Status: DC
Start: 2023-06-13 — End: 2024-03-05

## 2023-06-13 MED ORDER — ROSUVASTATIN CALCIUM 40 MG PO TABS
40 MG | ORAL_TABLET | ORAL | 0 refills | Status: DC
Start: 2023-06-13 — End: 2023-10-04

## 2023-06-13 MED ORDER — ONDANSETRON 4 MG PO TBDP
4 | ORAL_TABLET | Freq: Three times a day (TID) | ORAL | 0 refills | Status: AC | PRN
Start: 2023-06-13 — End: ?

## 2023-06-13 MED ORDER — VENLAFAXINE HCL ER 150 MG PO CP24
150 | ORAL_CAPSULE | Freq: Every day | ORAL | 0 refills | Status: AC
Start: 2023-06-13 — End: ?

## 2023-06-13 MED ORDER — PRAMIPEXOLE DIHYDROCHLORIDE 0.125 MG PO TABS
0.125 MG | ORAL_TABLET | ORAL | 0 refills | Status: AC
Start: 2023-06-13 — End: 2023-09-13

## 2023-06-13 MED ORDER — GABAPENTIN 300 MG PO CAPS
300 MG | ORAL_CAPSULE | ORAL | 0 refills | Status: AC
Start: 2023-06-13 — End: 2023-12-12

## 2023-06-13 MED ORDER — OMEPRAZOLE 40 MG PO CPDR
40 MG | ORAL_CAPSULE | Freq: Every day | ORAL | 0 refills | Status: AC
Start: 2023-06-13 — End: 2023-09-13

## 2023-06-13 MED ORDER — HYDROCHLOROTHIAZIDE 25 MG PO TABS
25 MG | ORAL_TABLET | ORAL | 0 refills | Status: AC
Start: 2023-06-13 — End: 2023-09-13

## 2023-06-13 MED ORDER — MUPIROCIN 2 % EX OINT
2 | CUTANEOUS | 1 refills | Status: AC
Start: 2023-06-13 — End: ?

## 2023-06-13 MED ORDER — LOSARTAN POTASSIUM 50 MG PO TABS
50 MG | ORAL_TABLET | Freq: Every day | ORAL | 0 refills | Status: AC
Start: 2023-06-13 — End: 2023-09-13

## 2023-06-13 MED ORDER — FARXIGA 5 MG PO TABS
5 MG | ORAL_TABLET | Freq: Every morning | ORAL | 1 refills | Status: AC
Start: 2023-06-13 — End: 2023-09-13

## 2023-06-13 NOTE — Progress Notes (Signed)
Christy Cochran (DOB: [birth year]) is a 67 y.o. female, an established patient, is here for evaluation of the following chief complaint(s):  Chief Complaint   Patient presents with    Diabetes    Results    sore inside right nostril          ASSESSMENT/PLAN:  [name] was seen today for diabetes, results and sore inside right nostril.    Diagnoses and all orders for this visit:    Type 2 diabetes mellitus with hyperglycemia, without long-term current use of insulin (HCC)  -     Basic Metabolic Panel; Future  -     Hemoglobin A1C; Future    Type 2 diabetes mellitus with diabetic neuropathy, without long-term current use of insulin (HCC)  -     semaglutide, 2 MG/DOSE, (OZEMPIC) 8 MG/3ML SOPN sc injection; Inject 2 mg into the skin every 7 days  -     FARXIGA 5 MG tablet; Take 1 tablet by mouth every morning  -     Basic Metabolic Panel; Future  -     Hemoglobin A1C; Future    Vitamin D deficiency  -     vitamin D (ERGOCALCIFEROL) 1.25 MG (50000 UT) CAPS capsule; Take 1 capsule by mouth once a week    Dyslipidemia  -     rosuvastatin (CRESTOR) 40 MG tablet; TAKE 1 TABLET BY MOUTH EVERY DAY    Decreased renal function    Anxiety state  -     venlafaxine (EFFEXOR XR) 150 MG extended release capsule; Take 1 capsule by mouth daily    Restless leg syndrome  -     pramipexole (MIRAPEX) 0.125 MG tablet; TAKE 3 TABLETS BY MOUTH EVERY HS    Gastroesophageal reflux disease without esophagitis  -     omeprazole (PRILOSEC) 40 MG delayed release capsule; Take 1 capsule by mouth daily    Hypertension, essential  -     losartan (COZAAR) 50 MG tablet; Take 1 tablet by mouth daily    Meniere's disease of both ears  -     hydroCHLOROthiazide (HYDRODIURIL) 25 MG tablet; TAKE 1 TABLET BY MOUTH EVERY DAY    Fibromyalgia  -     gabapentin (NEURONTIN) 300 MG capsule; TAKE 1 CASPULE BY MOUTH TWICE DAILY    Neuropathy  -     gabapentin (NEURONTIN) 300 MG capsule; TAKE 1 CASPULE BY MOUTH TWICE DAILY    Needs flu shot  -     Influenza, FLUAD  Trivalent, (age 75 y+), IM, Preservative Free, 0.54mL    Intranasal ulcer  -     mupirocin (BACTROBAN) 2 % ointment; Apply topically 3 times daily.    Nausea  -     ondansetron (ZOFRAN-ODT) 4 MG disintegrating tablet; Take 1 tablet by mouth 3 times daily as needed for Nausea or Vomiting    Periodic heart flutter  -     EKG 12 Lead; Future  -     EKG 12 Lead      I reviewed recent lab results w/  Ms. Baillargeon.      Vitamin D level is now treated to goal.  Continue vitamin D2 50,000 IU once weekly + Vit D3 4000 iu daily.      Diabetes is improving but not treated to goal.  Her hemoglobin A1c has come down to 7.9% since last July.  Currently prescribed:   Diabetic Medications       Incretin Mimetic Agents  semaglutide, 2 MG/DOSE, (OZEMPIC) 8 MG/3ML SOPN sc injection Inject 2 mg into the skin every 7 days       Sodium-Glucose Co-Transporter 2 (SGLT2) Inhibitors       FARXIGA 5 MG tablet Take 1 tablet by mouth every morning   She does have some occasional nausea.  Pt can use Mylanta prn.  She has been to see our diabetes nutritionist (but only went to 1 of 3 classes).  She will f/u w/ them.  No changes to meds at this time.  Re-eval in  3mos.      The 10-year ASCVD risk score (Arnett DK, et al., 2019) is: 9.5%    Values used to calculate the score:      Age: 65 years      Sex: Female      Is Non-Hispanic African American: No      Diabetic: Yes      Tobacco smoker: No      Systolic Blood Pressure: 110 mmHg      Is BP treated: Yes      HDL Cholesterol: 72 MG/DL      Total Cholesterol: 162 MG/DL  Currently prescribed:   Lipid Lowering Medications       HMG CoA Reductase Inhibitors       rosuvastatin (CRESTOR) 40 MG tablet TAKE 1 TABLET BY MOUTH EVERY DAY   And low fat diet is encouraged.    Her renal function is decreased.  Advised to drink 64 oz of water each day.     BP today is: well controlled.    Currently prescribed:   Hypertension Medications       Thiazides and Thiazide-Like Diuretics       hydroCHLOROthiazide  (HYDRODIURIL) 25 MG tablet TAKE 1 TABLET BY MOUTH EVERY DAY       Angiotensin II Receptor Antagonists       losartan (COZAAR) 50 MG tablet Take 1 tablet by mouth daily       She continues gabapentin twice daily for treatment of neuropathy.     RLS symptoms controlled w/ Mirapex    She has been experiencing intermittent fluttering in her chest that makes her feel like she needs to cough.  Not taking any stimulants.  No chest pain.  Symptoms began about 1 month ago.    EKG today shows:  Normal sinus rhythm with rate of 60, PR and QRS intervals within normal limits, QRS normal, no acute changes noted.  No prior EKG available for comparison.  Discussed that the intermittent fluttering which is substernal in location may be caused by Ozempic (reflux).  Encouraged to take omeprazole daily and may supplement with Mylanta on an as-needed basis.    Will treat right intranasal ulcer (which is recurrent) w/ Bactroban ointment.      Flu shot administered at today's visit.  She had a hematoma develop at the site of injection and increased bleeding.       On this date, I spent 42 minutes reviewing previous notes, test results and face to face with the patient discussing the diagnosis and importance of compliance with the treatment plan as well as documenting on the day of the visit.        Follow Up    Return for 12 weeks DM Mgt w/ lab prior to visit.     SUBJECTIVE/OBJECTIVE:  HPI    At her visit On 03/20/23, the following was discussed:     Her vitamin D level is low/normal. Currently  recommended vitamin D2 50,000 IU weekly. She has been taking as directed. Will add Vit D3 4000 iu daily.     Diabetes is poorly controlled.  Her hemoglobin A1c has increased to 10.8 since last March.    --She was out of Trulicity for about 6 weeks due to unavailability. Will try switching to Ozempic. Re-evaluate in 3 mos. Will also refer to meet w/ a dietician.     The 10-year ASCVD risk score (Arnett DK, et al., 2019) is: 18.3%   --Pt to work on  lifestyle changes and will recheck in 3 mos.     I am encouraging her to engage in counseling due to marital discord (list of counselors provided), and she will continue Effexor XR 150 mg daily for anxiety/stress.      Will also send in a prescription for Zofran to help with nausea, which she feels is also being caused by her ongoing stress.     Will refer to podiatry for eval and treatment of callus and skin flap on left foot.     At today's visit:     Patient has not been checking blood sugar.    Still having a problem with sore inside right nostril.      Vitals:    06/13/23 1002   BP: 110/71   Pulse: 66   Resp: 16   Temp: 98 F (36.7 C)   TempSrc: Oral   SpO2: 99%   Weight: 68.5 kg (151 lb)   Height: 1.626 m (5\' 4" )              Orders Placed This Encounter    Influenza, FLUAD Trivalent, (age 32 y+), IM, Preservative Free, 0.61mL    Basic Metabolic Panel     Standing Status:   Future     Standing Expiration Date:   12/12/2023    Hemoglobin A1C     Standing Status:   Future     Standing Expiration Date:   12/12/2023    EKG 12 Lead     Standing Status:   Future     Number of Occurrences:   1     Standing Expiration Date:   06/12/2024     Order Specific Question:   Reason for Exam?     Answer:   Irregular heart rate    vitamin D (ERGOCALCIFEROL) 1.25 MG (50000 UT) CAPS capsule     Sig: Take 1 capsule by mouth once a week     Dispense:  12 capsule     Refill:  0    venlafaxine (EFFEXOR XR) 150 MG extended release capsule     Sig: Take 1 capsule by mouth daily     Dispense:  90 capsule     Refill:  0     Please place rx on file until pt. Requests refill.    semaglutide, 2 MG/DOSE, (OZEMPIC) 8 MG/3ML SOPN sc injection     Sig: Inject 2 mg into the skin every 7 days     Dispense:  9 mL     Refill:  0     Please d/c any orders on file for Trulicity    rosuvastatin (CRESTOR) 40 MG tablet     Sig: TAKE 1 TABLET BY MOUTH EVERY DAY     Dispense:  90 tablet     Refill:  0    pramipexole (MIRAPEX) 0.125 MG tablet     Sig: TAKE 3  TABLETS BY MOUTH EVERY HS  Dispense:  270 tablet     Refill:  0    omeprazole (PRILOSEC) 40 MG delayed release capsule     Sig: Take 1 capsule by mouth daily     Dispense:  90 capsule     Refill:  0    losartan (COZAAR) 50 MG tablet     Sig: Take 1 tablet by mouth daily     Dispense:  90 tablet     Refill:  0    hydroCHLOROthiazide (HYDRODIURIL) 25 MG tablet     Sig: TAKE 1 TABLET BY MOUTH EVERY DAY     Dispense:  90 tablet     Refill:  0    gabapentin (NEURONTIN) 300 MG capsule     Sig: TAKE 1 CASPULE BY MOUTH TWICE DAILY     Dispense:  180 capsule     Refill:  0    FARXIGA 5 MG tablet     Sig: Take 1 tablet by mouth every morning     Dispense:  90 tablet     Refill:  1    vitamin D (VITAMIN D3) 50 MCG (2000 UT) CAPS capsule     Sig: Take 2 capsules by mouth daily    mupirocin (BACTROBAN) 2 % ointment     Sig: Apply topically 3 times daily.     Dispense:  15 g     Refill:  1    ondansetron (ZOFRAN-ODT) 4 MG disintegrating tablet     Sig: Take 1 tablet by mouth 3 times daily as needed for Nausea or Vomiting     Dispense:  21 tablet     Refill:  0         An electronic signature was used to authenticate this note.  -- Linus Orn, PA     Part of this note was written by using a voice dictation software. The note has been proof read but may still contain some grammatical/other typographical errors.

## 2023-07-04 ENCOUNTER — Inpatient Hospital Stay: Payer: BLUE CROSS/BLUE SHIELD | Primary: Medical

## 2023-07-04 NOTE — Progress Notes (Signed)
Outpatient Diabetes Education    Class Type: Diabetes Overview Session 2    Patient seen for outpatient diabetes education via in person session.    Participant attended Diabetes #2 session today provided educational booklet, "Managing Diabetes."  Patient educated regarding effects of physical activity on glycemic control and health. Educated regarding prevention of hypoglycemia with exercise. Patient encouraged to pursue physical activity (as approved by provider). Reviewed common barriers to physical activity and solutions to overcoming barriers.    Patient educated regarding diabetes medications (including orals, injectables, insulins). Educated regarding insulin administration. Demonstration using model and saline vial/syringe and pen methods was provided.    Educated regarding diabetes distress, stress management, and depression. Discussed importance of following up with provider if any of these symptoms persist.     Educated regarding proper diabetic foot care.    Educated regarding recommended vaccines and Diabetes Emergency Plan.    Encouraged lifestyle modifications, medication compliance, compliance with recommendations, and follow up with primary care provider.      Certified Diabetes Care and Educational Specialist  [facility]

## 2023-07-13 ENCOUNTER — Encounter: Admit: 2023-07-13 | Admitting: Medical

## 2023-07-13 DIAGNOSIS — Z1231 Encounter for screening mammogram for malignant neoplasm of breast: Secondary | ICD-10-CM

## 2023-07-27 NOTE — Telephone Encounter (Signed)
 Called pt to remind her of her 08/01/23 Nutrition diabetes class in [place]. Pt scheduled Jan, Feb, March classes as well stating she missed some classes and will come again to them all.

## 2023-08-01 ENCOUNTER — Inpatient Hospital Stay: Payer: BLUE CROSS/BLUE SHIELD | Primary: Medical

## 2023-08-01 NOTE — Progress Notes (Signed)
 Outpatient Diabetes Education    Class Type: Diabetes Nutrition Overview Session 1    Participant attended nutrition one session for clients with type 2 diabetes today titled understanding how you eat and how it impacts your diabetes. Topics included: how

## 2023-08-27 ENCOUNTER — Encounter: Admit: 2023-08-27 | Admitting: Medical

## 2023-08-27 DIAGNOSIS — H8103 Meniere's disease, bilateral: Secondary | ICD-10-CM

## 2023-08-27 NOTE — Telephone Encounter (Signed)
 Office does not accept pharmacy requests. Pt must contact office directly.

## 2023-09-01 ENCOUNTER — Ambulatory Visit
Admit: 2023-09-01 | Discharge: 2023-09-05 | Disposition: A | Discharge status: Home / Self Care | Payer: BLUE CROSS/BLUE SHIELD | Source: Ambulatory Visit | Attending: Medical | Admitting: Medical | Primary: Medical

## 2023-09-01 ENCOUNTER — Encounter: Admit: 2023-09-01 | Admitting: Medical

## 2023-09-01 DIAGNOSIS — Z1231 Encounter for screening mammogram for malignant neoplasm of breast: Secondary | ICD-10-CM

## 2023-09-05 ENCOUNTER — Inpatient Hospital Stay: Payer: MEDICARE | Primary: Medical

## 2023-09-05 NOTE — Progress Notes (Signed)
Outpatient Diabetes Education    Class Type: Diabetes Nutrition Overview Session 2    Participant attended nutrition 2 session today for clients with type 2 diabetes titled "Understanding carbohydrate counting". Topics included: Review of what will raise blood sugars, type of carbohydrates, what is carbohydrate counting, understanding how many carbohydrates are in food, carbohydrate counting using a guide book, meal planning using carbohydrate counting, reading food labels, practice with reading food labels, understanding fats, sodium, barriers to healthy lifestyle changes/solutions, snack ideas, tips for snacking, special diets, grocery shopping tips, herbs and spices, recipe modification and resources. Written booklet/material provided.      Encouraged lifestyle modifications and follow up with primary care provider.    Certified Diabetes Care and Educational Specialist  [facility]

## 2023-09-12 ENCOUNTER — Other Ambulatory Visit: Admit: 2023-09-12 | Discharge: 2023-09-12 | Payer: MEDICARE | Primary: Medical

## 2023-09-12 DIAGNOSIS — E1165 Type 2 diabetes mellitus with hyperglycemia: Secondary | ICD-10-CM

## 2023-09-13 ENCOUNTER — Ambulatory Visit: Admit: 2023-09-13 | Discharge: 2023-09-13 | Payer: MEDICARE | Attending: Medical | Primary: Medical

## 2023-09-13 DIAGNOSIS — I129 Hypertensive chronic kidney disease with stage 1 through stage 4 chronic kidney disease, or unspecified chronic kidney disease: Secondary | ICD-10-CM

## 2023-09-13 DIAGNOSIS — E1165 Type 2 diabetes mellitus with hyperglycemia: Secondary | ICD-10-CM

## 2023-09-13 LAB — BASIC METABOLIC PANEL
Anion Gap: 11 mmol/L (ref 7–16)
BUN: 19 mg/dL (ref 8–23)
CO2: 31 mmol/L — ABNORMAL HIGH (ref 20–29)
Calcium: 10.1 mg/dL (ref 8.8–10.2)
Chloride: 97 mmol/L — ABNORMAL LOW (ref 98–107)
Creatinine: 1.09 mg/dL (ref 0.60–1.10)
Est, Glom Filt Rate: 56 mL/min/{1.73_m2} — ABNORMAL LOW (ref >60)
Glucose: 143 mg/dL — ABNORMAL HIGH (ref 70–99)
Potassium: 3.5 mmol/L (ref 3.5–5.1)
Sodium: 139 mmol/L (ref 136–145)

## 2023-09-13 LAB — HEMOGLOBIN A1C
Estimated Avg Glucose: 165 mg/dL
Hemoglobin A1C: 7.4 % — ABNORMAL HIGH (ref 0–5.6)

## 2023-09-13 MED ORDER — LOSARTAN POTASSIUM 25 MG PO TABS
25 | ORAL_TABLET | Freq: Every day | ORAL | 0 refills | Status: AC
Start: 2023-09-13 — End: ?

## 2023-09-13 MED ORDER — ESTRADIOL 0.1 MG/GM VA CREA
0.1 MG/GM | VAGINAL | 11 refills | Status: DC
Start: 2023-09-13 — End: 2023-12-31

## 2023-09-13 MED ORDER — GABAPENTIN 300 MG PO CAPS
300 MG | ORAL_CAPSULE | ORAL | 1 refills | Status: DC
Start: 2023-09-13 — End: 2023-12-31

## 2023-09-13 MED ORDER — HYDROCHLOROTHIAZIDE 25 MG PO TABS
25 | ORAL_TABLET | ORAL | 1 refills | Status: AC
Start: 2023-09-13 — End: ?

## 2023-09-13 MED ORDER — LEVOFLOXACIN 500 MG PO TABS
500 | ORAL_TABLET | Freq: Every day | ORAL | 0 refills | Status: AC
Start: 2023-09-13 — End: 2023-09-23

## 2023-09-13 MED ORDER — OMEPRAZOLE 40 MG PO CPDR
40 MG | ORAL_CAPSULE | Freq: Every day | ORAL | 1 refills | Status: DC
Start: 2023-09-13 — End: 2023-12-31

## 2023-09-13 MED ORDER — PRAMIPEXOLE DIHYDROCHLORIDE 0.125 MG PO TABS
0.125 | ORAL_TABLET | ORAL | 1 refills | 30.00000 days | Status: DC
Start: 2023-09-13 — End: 2023-12-31

## 2023-09-13 MED ORDER — NEOMYCIN-COLIST-HC-THONZONIUM 3.3-3-10-0.5 MG/ML OT SUSP
3.3-3-10-0.5 | Freq: Four times a day (QID) | OTIC | 0 refills | Status: AC
Start: 2023-09-13 — End: 2023-09-20

## 2023-09-13 MED ORDER — FARXIGA 5 MG PO TABS
5 MG | ORAL_TABLET | Freq: Every morning | ORAL | 1 refills | Status: DC
Start: 2023-09-13 — End: 2023-12-31

## 2023-09-13 NOTE — Progress Notes (Signed)
Christy Cochran (DOB: [birth year]) is a 68 y.o. female, an established patient, is here for evaluation of the following chief complaint(s):  Chief Complaint   Patient presents with    Diabetes    Results    Sinus Problem          ASSESSMENT/PLAN:  Christy Cochran was seen today for diabetes, results and sinus problem.    Diagnoses and all orders for this visit:    Benign hypertension with CKD (chronic kidney disease) stage III (HCC)  -     losartan (COZAAR) 25 MG tablet; Take 1 tablet by mouth daily    Type 2 diabetes mellitus with hyperglycemia, without long-term current use of insulin (HCC)  -     FARXIGA 5 MG tablet; Take 1 tablet by mouth every morning  -     Comprehensive Metabolic Panel; Future  -     Hemoglobin A1C; Future  -     Lipid Panel; Future  -     Albumin/Creatinine Ratio, Urine; Future    Type 2 diabetes mellitus with diabetic neuropathy, without long-term current use of insulin (HCC)  -     FARXIGA 5 MG tablet; Take 1 tablet by mouth every morning  -     Comprehensive Metabolic Panel; Future  -     Hemoglobin A1C; Future  -     Lipid Panel; Future  -     Albumin/Creatinine Ratio, Urine; Future    Dyslipidemia  -     Comprehensive Metabolic Panel; Future  -     Lipid Panel; Future    Vitamin D deficiency  -     Vitamin D 25 Hydroxy; Future    Age-related osteoporosis without current pathological fracture  -     Vitamin D 25 Hydroxy; Future    Acute bacterial sinusitis  -     levoFLOXacin (LEVAQUIN) 500 MG tablet; Take 1 tablet by mouth daily for 10 days    Acute swimmer's ear of right side  -     neomycin-colistin-hydrocortisone-thonzonium (CORTISPORIN TC) 3.10-28-08-0.5 MG/ML otic suspension; Place 3 drops into the right ear 4 times daily for 7 days    Postmenopausal atrophic vaginitis  -     estradiol (ESTRACE VAGINAL) 0.1 MG/GM vaginal cream; Place 1 g vaginally Twice a Week    Neuropathy  -     gabapentin (NEURONTIN) 300 MG capsule; TAKE 1 CASPULE BY MOUTH TWICE DAILY    Meniere's disease of both ears  -      hydroCHLOROthiazide (HYDRODIURIL) 25 MG tablet; TAKE 1 TABLET BY MOUTH EVERY DAY    Gastroesophageal reflux disease without esophagitis  -     omeprazole (PRILOSEC) 40 MG delayed release capsule; Take 1 capsule by mouth daily    Fibromyalgia  -     gabapentin (NEURONTIN) 300 MG capsule; TAKE 1 CASPULE BY MOUTH TWICE DAILY    Restless leg syndrome  -     pramipexole (MIRAPEX) 0.125 MG tablet; TAKE 3 TABLETS BY MOUTH EVERY HS    Other orders  -     Basic Metabolic Panel; Future      I reviewed recent lab results w/  Christy Cochran.      Diabetes is improving but not treated to goal.   Her hemoglobin A1c has come down to 7.4% since last October.  Currently prescribed:   Diabetic Medications       Incretin Mimetic Agents       semaglutide, 2 MG/DOSE, (OZEMPIC) 8 MG/3ML SOPN sc  injection Inject 2 mg into the skin every 7 days       Sodium-Glucose Co-Transporter 2 (SGLT2) Inhibitors       FARXIGA 5 MG tablet Take 1 tablet by mouth every morning   Continue meds as before.  Continue w/ low glycemic diet.  Has been to meet w/ dietician recently.      Pt to schedule a diab eye exam.      She has CKD IIIa. (Newly diagnosed.  Christy Cochran was informed of the risks of various medications that can be harmful in CKD including, but not limited to, Motrin, Ibuprofen, Advil, Naprosyn and Aleve.  Tylenol is ok (but do not exceed a total of 3000 mg/day).    reduce Red meat like beef and pork, instead substitute with chicken , eggs , [place] and fish . Can take handsize red meat in a week Also encouraged to limit/avoid salt in diet, exercise, and keeping lipids under control.  Advised to drink 60 oz of water each day.      BP today is: low.  Currently prescribed:   Hypertension Medications       Thiazides and Thiazide-Like Diuretics       hydroCHLOROthiazide (HYDRODIURIL) 25 MG tablet TAKE 1 TABLET BY MOUTH EVERY DAY       Angiotensin II Receptor Antagonists       losartan (COZAAR) 50 MG tablet Take 1 tablet by mouth daily   Will reduce  losartan to 25 mg daily and re-eval in 4-6 weeks.      She continues gabapentin twice daily for treatment of neuropathy.     RLS symptoms controlled w/ Mirapex    Continue vitamin D2 50,000 IU once weekly + Vit D3 4000 iu daily for tx of Vit D def.     She has osteoporosis and is prescribed Fosamax weekly.  She has been on Fosamax since 03/09/2021.  Next DEXA not due until on or after 02/25/2024.    She has sinus congestion with right maxillary sinus tenderness x 6 days.  Her right external ear canal is also inflamed and erythematous.  Will treat for sinusitis with Levaquin x 10 days and otitis externa with Cipro HC gt. ts x 7 days    Vaccine recommendations reviewed with patient.      Follow Up    4-6 weeks HTN re-eval w/ lab prior to visit  24 weeks AWV/HTN/DM Mgt w/ labs prior to visit    SUBJECTIVE/OBJECTIVE:  HPI    At her visit On 06/13/23, the following was discussed:     Diabetes is improving but not treated to goal.  Her hemoglobin A1c has come down to 7.9% since last July.   --She has been to see our diabetes nutritionist (but only went to 1 of 3 classes). She will f/u w/ them. No changes to meds at this time. Re-eval in 3mos.     Her renal function is decreased.  Advised to drink 64 oz of water each day.     She has been experiencing intermittent fluttering in her chest that makes her feel like she needs to cough.  Not taking any stimulants.  No chest pain.  Symptoms began about 1 month ago.    EKG today shows:  Normal sinus rhythm with rate of 60, PR and QRS intervals within normal limits, QRS normal, no acute changes noted.  No prior EKG available for comparison.  Discussed that the intermittent fluttering which is substernal in location may be caused by  Ozempic (reflux).  Encouraged to take omeprazole daily and may supplement with Mylanta on an as-needed basis.    Will treat right intranasal ulcer (which is recurrent) w/ Bactroban ointment.     At today's visit:     Blood sugar was  142  yesterday.    Sinus headache past 6 days.  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      Vitals:    09/13/23 1432   BP: 98/64   Pulse: 86   Resp: 16   Temp: 98 F (36.7 C)   TempSrc: Oral   SpO2: 98%   Weight: 66.7 kg (147 lb)   Height: 1.626 m (5\' 4" )      Last Weight Metrics:      09/13/2023     2:32 PM 06/13/2023    10:02 AM 03/20/2023     8:37 AM 12/29/2022    10:57 AM 11/17/2022     9:09 AM 05/26/2022     9:02 AM 02/24/2022     9:56 AM   Weight Loss Metrics   Height 5\' 4"  5\' 4"  5\' 4"  5\' 4"  5\' 4"  5\' 4"  5\' 4"    Weight - Scale 147 lbs 151 lbs 161 lbs 3 oz 164 lbs 169 lbs 157 lbs 6 oz 156 lbs   BMI (Calculated) 25.3 kg/m2 26 kg/m2 27.7 kg/m2 28.2 kg/m2 29.1 kg/m2 27.1 kg/m2 26.8 kg/m2         Orders Placed This Encounter    Basic Metabolic Panel     Standing Status:   Future     Standing Expiration Date:   12/06/2023    Comprehensive Metabolic Panel     Standing Status:   Future     Standing Expiration Date:   09/12/2024    Hemoglobin A1C     Standing Status:   Future     Standing Expiration Date:   09/12/2024    Lipid Panel     Standing Status:   Future     Standing Expiration Date:   09/12/2024    Albumin/Creatinine Ratio, Urine     Standing Status:   Future     Standing Expiration Date:   09/12/2024    Vitamin D 25 Hydroxy     Standing Status:   Future     Standing Expiration Date:   09/12/2024    levoFLOXacin (LEVAQUIN) 500 MG tablet     Sig: Take 1 tablet by mouth daily for 10 days     Dispense:  10 tablet     Refill:  0    neomycin-colistin-hydrocortisone-thonzonium (CORTISPORIN TC) 3.10-28-08-0.5 MG/ML otic suspension     Sig: Place 3 drops into the right ear 4 times daily for 7 days     Dispense:  10 mL     Refill:  0    estradiol (ESTRACE VAGINAL) 0.1 MG/GM vaginal cream     Sig: Place 1 g vaginally Twice a Week     Dispense:  42.5 g     Refill:  11     Please d/c any orders on file for Vagifem tabs    FARXIGA 5 MG tablet     Sig: Take 1 tablet by mouth every morning     Dispense:  90 tablet     Refill:  1    gabapentin  (NEURONTIN) 300 MG capsule     Sig: TAKE 1 CASPULE BY MOUTH TWICE DAILY     Dispense:  180 capsule     Refill:  1  hydroCHLOROthiazide (HYDRODIURIL) 25 MG tablet     Sig: TAKE 1 TABLET BY MOUTH EVERY DAY     Dispense:  90 tablet     Refill:  1    losartan (COZAAR) 25 MG tablet     Sig: Take 1 tablet by mouth daily     Dispense:  90 tablet     Refill:  0     Please d/c any orders on file for losartan 50 mg    omeprazole (PRILOSEC) 40 MG delayed release capsule     Sig: Take 1 capsule by mouth daily     Dispense:  90 capsule     Refill:  1    pramipexole (MIRAPEX) 0.125 MG tablet     Sig: TAKE 3 TABLETS BY MOUTH EVERY HS     Dispense:  270 tablet     Refill:  1             An electronic signature was used to authenticate this note.  -- Linus Orn, PA     Part of this note was written by using a voice dictation software. The note has been proof read but may still contain some grammatical/other typographical errors.

## 2023-10-03 ENCOUNTER — Inpatient Hospital Stay: Payer: BLUE CROSS/BLUE SHIELD | Primary: Medical

## 2023-10-03 NOTE — Progress Notes (Signed)
Patient was not present for diabetes education class today. Will call patient to see if patient interested in rescheduling. Patient can call [phone] to reschedule.

## 2023-10-04 ENCOUNTER — Encounter

## 2023-10-04 MED ORDER — ROSUVASTATIN CALCIUM 40 MG PO TABS
40 MG | ORAL_TABLET | ORAL | 2 refills | Status: DC
Start: 2023-10-04 — End: 2023-12-31

## 2023-10-04 NOTE — Telephone Encounter (Signed)
 Patient needed to clarify message ( Crestor sent to the pharmacy at patient's request)

## 2023-10-04 NOTE — Telephone Encounter (Signed)
 Prescription filled per pt request.  Please notify pt.

## 2023-10-04 NOTE — Telephone Encounter (Signed)
 Mrs. Christy Cochran, Christy Cochran would like a call back at  [phone]

## 2023-10-04 NOTE — Telephone Encounter (Signed)
 Requested Rx was sent to the pharmacy and patient has been notified via voice mail.

## 2023-10-09 ENCOUNTER — Encounter

## 2023-10-09 NOTE — Telephone Encounter (Signed)
 Patient should call our office if refill is needed.

## 2023-10-22 ENCOUNTER — Other Ambulatory Visit: Admit: 2023-10-22 | Discharge: 2023-10-22 | Payer: BLUE CROSS/BLUE SHIELD | Primary: Medical

## 2023-10-22 DIAGNOSIS — I129 Hypertensive chronic kidney disease with stage 1 through stage 4 chronic kidney disease, or unspecified chronic kidney disease: Secondary | ICD-10-CM

## 2023-10-23 LAB — BASIC METABOLIC PANEL
Anion Gap: 10 mmol/L (ref 7–16)
BUN: 19 mg/dL (ref 8–23)
CO2: 27 mmol/L (ref 20–29)
Calcium: 9.1 mg/dL (ref 8.8–10.2)
Chloride: 103 mmol/L (ref 98–107)
Creatinine: 0.92 mg/dL (ref 0.60–1.10)
Est, Glom Filt Rate: 69 mL/min/{1.73_m2} (ref >60)
Glucose: 144 mg/dL — ABNORMAL HIGH (ref 70–99)
Potassium: 3.8 mmol/L (ref 3.5–5.1)
Sodium: 140 mmol/L (ref 136–145)

## 2023-10-26 ENCOUNTER — Encounter

## 2023-10-26 NOTE — Telephone Encounter (Signed)
 Patient should call our office if refill is needed.

## 2023-10-29 ENCOUNTER — Ambulatory Visit: Admit: 2023-10-29 | Discharge: 2023-10-29 | Payer: MEDICARE | Attending: Medical | Primary: Medical

## 2023-10-29 VITALS — HR 55 | Temp 97.60000°F | Resp 16 | Ht 64.0 in | Wt 149.0 lb

## 2023-10-29 DIAGNOSIS — I10 Essential (primary) hypertension: Secondary | ICD-10-CM

## 2023-10-29 DIAGNOSIS — N183 Chronic kidney disease, stage 3 unspecified (HCC): Secondary | ICD-10-CM

## 2023-10-29 MED ORDER — LOSARTAN POTASSIUM 25 MG PO TABS
25 | ORAL_TABLET | Freq: Every day | ORAL | 0 refills | Status: DC
Start: 2023-10-29 — End: 2023-10-29

## 2023-10-29 MED ORDER — SEMAGLUTIDE (2 MG/DOSE) 8 MG/3ML SC SOPN
8 | SUBCUTANEOUS | 1 refills | 84.00000 days | Status: DC
Start: 2023-10-29 — End: 2023-12-31

## 2023-10-29 MED ORDER — LOSARTAN POTASSIUM 25 MG PO TABS
25 MG | ORAL_TABLET | Freq: Every day | ORAL | 0 refills | Status: DC
Start: 2023-10-29 — End: 2023-12-31

## 2023-10-29 MED ORDER — HYDROCHLOROTHIAZIDE 12.5 MG PO TABS
12.5 MG | ORAL_TABLET | ORAL | 1 refills | Status: DC
Start: 2023-10-29 — End: 2023-12-31

## 2023-10-29 MED ORDER — ONDANSETRON 4 MG PO TBDP
4 | ORAL_TABLET | Freq: Three times a day (TID) | ORAL | 0 refills | Status: DC | PRN
Start: 2023-10-29 — End: 2024-03-05

## 2023-10-29 MED ORDER — VENLAFAXINE HCL ER 150 MG PO CP24
150 MG | ORAL_CAPSULE | Freq: Every day | ORAL | 1 refills | Status: DC
Start: 2023-10-29 — End: 2023-12-31

## 2023-10-29 NOTE — Addendum Note (Signed)
 Addended by: Linus Orn on: 10/29/2023 10:23 AM     Modules accepted: Orders

## 2023-10-29 NOTE — Assessment & Plan Note (Addendum)
 Chronic, not at goal (unstable), continue current treatment plan    Orders:    semaglutide, 2 MG/DOSE, (OZEMPIC) 8 MG/3ML SOPN sc injection; Inject 2 mg into the skin every 7 days

## 2023-10-29 NOTE — Assessment & Plan Note (Addendum)
 BP today is: low/normal.    Currently prescribed:   Hypertension Medications       Thiazides and Thiazide-Like Diuretics       hydroCHLOROthiazide (HYDRODIURIL) 25 MG tablet TAKE 1 TABLET BY MOUTH EVERY DAY       Angiotensin II Receptor Antagonists       losartan (COZAAR) 25 MG tablet Take 1 tablet by mouth daily     Taking HCTZ for tx of meniere's.  Will try reducing dose to 12.5 mg daily and if symptoms worsen, she'll let me know.      Her renal function is back to normal.  Orders:    hydroCHLOROthiazide 12.5 MG tablet; TAKE 1 TABLET BY MOUTH EVERY DAY    losartan (COZAAR) 25 MG tablet; Take 1 tablet by mouth daily

## 2023-10-29 NOTE — Assessment & Plan Note (Addendum)
 Chronic, at goal (stable), continue current treatment plan    Orders:    venlafaxine (EFFEXOR XR) 150 MG extended release capsule; Take 1 capsule by mouth daily

## 2023-10-29 NOTE — Progress Notes (Addendum)
 Christy Cochran (DOB: Feb 12, 1956) is a 68 y.o. female, an established patient, is here for evaluation of the following chief complaint(s):  Chief Complaint   Patient presents with    Hypertension    Results        ASSESSMENT/PLAN:    Assessment & Plan  Hypertension, essential, benign     BP today is: low/normal.    Currently prescribed:   Hypertension Medications       Thiazides and Thiazide-Like Diuretics       hydroCHLOROthiazide (HYDRODIURIL) 25 MG tablet TAKE 1 TABLET BY MOUTH EVERY DAY       Angiotensin II Receptor Antagonists       losartan (COZAAR) 25 MG tablet Take 1 tablet by mouth daily     Taking HCTZ for tx of meniere's.  Will try reducing dose to 12.5 mg daily and if symptoms worsen, she'll let me know.      Her renal function is back to normal.  Orders:    hydroCHLOROthiazide 12.5 MG tablet; TAKE 1 TABLET BY MOUTH EVERY DAY    losartan (COZAAR) 25 MG tablet; Take 1 tablet by mouth daily    Meniere's disease of both ears   Chronic, at goal (stable), continue current treatment plan    Orders:    hydroCHLOROthiazide 12.5 MG tablet; TAKE 1 TABLET BY MOUTH EVERY DAY    Anxiety state   Chronic, at goal (stable), continue current treatment plan    Orders:    venlafaxine (EFFEXOR XR) 150 MG extended release capsule; Take 1 capsule by mouth daily    Type 2 diabetes mellitus with diabetic neuropathy, without long-term current use of insulin (HCC)   Chronic, not at goal (unstable), continue current treatment plan    Orders:    semaglutide, 2 MG/DOSE, (OZEMPIC) 8 MG/3ML SOPN sc injection; Inject 2 mg into the skin every 7 days    Nausea     Has occasionally, as a side effect of Semaglutide.  Taking Zofran sparingly/prn.     Orders:    ondansetron (ZOFRAN-ODT) 4 MG disintegrating tablet; Take 1 tablet by mouth 3 times daily as needed for Nausea or Vomiting    Impacted cerumen, right ear     Patient to use Debrox for home ear wax removal and also plans to f/u w/ ENT for removal.             She continues gabapentin  twice daily for treatment of neuropathy.     RLS symptoms controlled w/ Mirapex     Continue vitamin D2 50,000 IU once weekly + Vit D3 4000 iu daily for tx of Vit D def.      She has osteoporosis and is prescribed Fosamax weekly.  She has been on Fosamax since 03/09/2021.  Next DEXA not due until on or after 02/25/2024.      Follow Up    Return for F/u as scheduled in July.     SUBJECTIVE/OBJECTIVE:  HPI    At her visit On 09/13/23, the following was discussed:     Diabetes is improving but not treated to goal.   Her hemoglobin A1c has come down to 7.4% since last October.   --Continue meds as before. Continue w/ low glycemic diet. Has been to meet w/ dietician recently.       BP today is: low.   --Will reduce losartan to 25 mg daily and re-eval in 4-6 weeks.      At today's visit:     Patient checks  BP at home forgot to bring log.  Denies CP,  SOB,  HA and dizziness.  Dizziness resolved w/ reduced dose of losartan.  No new problems.      Vitals:    10/29/23 0952   Pulse: 55   Resp: 16   Temp: 97.6 F (36.4 C)   TempSrc: Oral   SpO2: 99%   Weight: 67.6 kg (149 lb)   Height: 1.626 m (5\' 4" )              Orders Placed This Encounter    hydroCHLOROthiazide 12.5 MG tablet     Sig: TAKE 1 TABLET BY MOUTH EVERY DAY     Dispense:  90 tablet     Refill:  1     Please d/c any orders on file for HCTZ 25 mg    DISCONTD: losartan (COZAAR) 25 MG tablet     Sig: Take 1 tablet by mouth daily     Dispense:  90 tablet     Refill:  0     Please place rx on file until pt. Requests refill.    venlafaxine (EFFEXOR XR) 150 MG extended release capsule     Sig: Take 1 capsule by mouth daily     Dispense:  90 capsule     Refill:  1     Please place rx on file until pt. Requests refill.    semaglutide, 2 MG/DOSE, (OZEMPIC) 8 MG/3ML SOPN sc injection     Sig: Inject 2 mg into the skin every 7 days     Dispense:  9 mL     Refill:  1     Please d/c any orders on file for Trulicity    ondansetron (ZOFRAN-ODT) 4 MG disintegrating tablet     Sig:  Take 1 tablet by mouth 3 times daily as needed for Nausea or Vomiting     Dispense:  21 tablet     Refill:  0     Please place rx on file until pt. Requests refill.    DISCONTD: losartan (COZAAR) 25 MG tablet     Sig: Take 1 tablet by mouth daily     Dispense:  90 tablet     Refill:  0     Please place rx on file until pt. Requests refill.    losartan (COZAAR) 25 MG tablet     Sig: Take 1 tablet by mouth daily     Dispense:  90 tablet     Refill:  0     Please place rx on file until pt. Requests refill.             An electronic signature was used to authenticate this note.  -- Linus Orn, PA     Part of this note was written by using a voice dictation software. The note has been proof read but may still contain some grammatical/other typographical errors.

## 2023-10-29 NOTE — Addendum Note (Signed)
 Addended by: Linus Orn on: 10/29/2023 10:26 AM     Modules accepted: Orders

## 2023-10-29 NOTE — Assessment & Plan Note (Addendum)
 Chronic, at goal (stable), continue current treatment plan    Orders:    hydroCHLOROthiazide 12.5 MG tablet; TAKE 1 TABLET BY MOUTH EVERY DAY

## 2023-10-31 ENCOUNTER — Inpatient Hospital Stay: Payer: MEDICARE | Primary: Medical

## 2023-12-05 ENCOUNTER — Encounter: Payer: MEDICARE | Primary: Medical

## 2023-12-05 NOTE — Telephone Encounter (Signed)
 No show for diabetes nutrition 1 class today in [place]. Called and left message to call back at [phone] if she wanted to r/s.

## 2023-12-11 ENCOUNTER — Encounter

## 2023-12-11 NOTE — Telephone Encounter (Signed)
 Patient should call our office if refill is needed.

## 2023-12-21 ENCOUNTER — Encounter

## 2023-12-21 NOTE — Telephone Encounter (Signed)
 A user error has taken place: encounter opened in error, closed for administrative reasons.

## 2023-12-24 NOTE — Telephone Encounter (Signed)
 Patient should call our office if refill is needed.

## 2023-12-27 NOTE — Telephone Encounter (Signed)
 Christy Cochran tried several times to call [facility] --and no one is answering their phone line.      I then left a vm message asking the patient if she is wanting to send her prescriptions to a different pharmacy and if so, which pharmacy and which meds?

## 2023-12-27 NOTE — Telephone Encounter (Signed)
 Erica faxed over the forms to get refills for the patient. She needs this information verified. Please fax this to her at [phone]. She faxed the information on 12/25/23.

## 2023-12-31 ENCOUNTER — Encounter

## 2023-12-31 MED ORDER — PRAMIPEXOLE DIHYDROCHLORIDE 0.125 MG PO TABS
0.125 | ORAL_TABLET | ORAL | 0 refills | 30.00000 days | Status: DC
Start: 2023-12-31 — End: 2024-03-05

## 2023-12-31 MED ORDER — VENLAFAXINE HCL ER 150 MG PO CP24
150 | ORAL_CAPSULE | Freq: Every day | ORAL | 0 refills | 90.00000 days | Status: DC
Start: 2023-12-31 — End: 2024-03-05

## 2023-12-31 MED ORDER — LOSARTAN POTASSIUM 25 MG PO TABS
25 | ORAL_TABLET | Freq: Every day | ORAL | 0 refills | 90.00000 days | Status: DC
Start: 2023-12-31 — End: 2024-03-05

## 2023-12-31 MED ORDER — OMEPRAZOLE 40 MG PO CPDR
40 | ORAL_CAPSULE | Freq: Every day | ORAL | 0 refills | 90.00000 days | Status: DC
Start: 2023-12-31 — End: 2024-03-05

## 2023-12-31 MED ORDER — HYDROCHLOROTHIAZIDE 12.5 MG PO TABS
12.5 | ORAL_TABLET | ORAL | 0 refills | 90.00000 days | Status: DC
Start: 2023-12-31 — End: 2024-03-05

## 2023-12-31 MED ORDER — ROSUVASTATIN CALCIUM 40 MG PO TABS
40 | ORAL_TABLET | ORAL | 0 refills | 90.00000 days | Status: DC
Start: 2023-12-31 — End: 2024-03-05

## 2023-12-31 MED ORDER — ESTRADIOL 0.1 MG/GM VA CREA
0.1 | VAGINAL | 0 refills | 84.00000 days | Status: DC
Start: 2023-12-31 — End: 2024-03-05

## 2023-12-31 MED ORDER — GABAPENTIN 300 MG PO CAPS
300 | ORAL_CAPSULE | ORAL | 0 refills | 30.00000 days | Status: DC
Start: 2023-12-31 — End: 2024-03-05

## 2023-12-31 MED ORDER — SEMAGLUTIDE (2 MG/DOSE) 8 MG/3ML SC SOPN
8 | SUBCUTANEOUS | 0 refills | 84.00000 days | Status: DC
Start: 2023-12-31 — End: 2024-03-05

## 2023-12-31 MED ORDER — FARXIGA 5 MG PO TABS
5 | ORAL_TABLET | Freq: Every morning | ORAL | 0 refills | 90.00000 days | Status: DC
Start: 2023-12-31 — End: 2024-03-05

## 2023-12-31 NOTE — Telephone Encounter (Signed)
 A form was faxed over today and is now in your folder.

## 2024-01-03 ENCOUNTER — Encounter

## 2024-01-03 NOTE — Telephone Encounter (Signed)
 Patient should call our office if refill is needed.

## 2024-01-08 NOTE — Telephone Encounter (Signed)
 Patient should call our office if refill is needed.

## 2024-01-30 ENCOUNTER — Inpatient Hospital Stay: Payer: MEDICARE | Primary: Medical

## 2024-01-30 NOTE — Progress Notes (Signed)
 Patient was a no show for scheduled diabetes education class. Patient did attend the nutrition classes last year but repeatedly no showed to medical classes. Will no longer pursue. Patient is welcome to call [phone] if she wishes to take classes at a later time.

## 2024-02-22 ENCOUNTER — Encounter

## 2024-02-25 NOTE — Telephone Encounter (Signed)
 Patient should call our office if refill is needed.

## 2024-02-27 ENCOUNTER — Other Ambulatory Visit: Admit: 2024-02-27 | Discharge: 2024-02-27 | Payer: Medicare (Managed Care) | Primary: Medical

## 2024-02-27 DIAGNOSIS — E559 Vitamin D deficiency, unspecified: Principal | ICD-10-CM

## 2024-02-27 LAB — HEMOGLOBIN A1C
Estimated Avg Glucose: 151 mg/dL
Hemoglobin A1C: 6.9 % — ABNORMAL HIGH (ref 0–5.6)

## 2024-02-27 LAB — ALBUMIN/CREATININE RATIO, URINE
Albumin Urine: <1.2 mg/dL (ref 0.00–20.00)
Creatinine, Ur: 128 mg/dL (ref 28.00–217.00)

## 2024-02-28 LAB — COMPREHENSIVE METABOLIC PANEL
ALT: 22 U/L (ref 8–45)
AST: 19 U/L (ref 15–37)
Albumin/Globulin Ratio: 1.5 (ref 1.0–1.9)
Albumin: 4 g/dL (ref 3.2–4.6)
Alk Phosphatase: 96 U/L (ref 35–104)
Anion Gap: 14 mmol/L (ref 7–16)
BUN: 14 mg/dL (ref 8–23)
CO2: 25 mmol/L (ref 20–29)
Calcium: 9.6 mg/dL (ref 8.8–10.2)
Chloride: 98 mmol/L (ref 98–107)
Creatinine: 0.97 mg/dL (ref 0.60–1.10)
Est, Glom Filt Rate: 64 mL/min/{1.73_m2} (ref >60)
Globulin: 2.6 g/dL (ref 2.3–3.5)
Glucose: 120 mg/dL — ABNORMAL HIGH (ref 70–99)
Potassium: 3.9 mmol/L (ref 3.5–5.1)
Sodium: 136 mmol/L (ref 136–145)
Total Bilirubin: 0.6 mg/dL (ref 0.0–1.2)
Total Protein: 6.6 g/dL (ref 6.3–8.2)

## 2024-02-28 LAB — VITAMIN D 25 HYDROXY: Vit D, 25-Hydroxy: 38.6 ng/mL (ref 30.0–100.0)

## 2024-02-28 LAB — LIPID PANEL
Chol/HDL Ratio: 3.8 (ref 0.0–5.0)
Cholesterol, Total: 276 mg/dL — ABNORMAL HIGH (ref 0–200)
HDL: 72 mg/dL — ABNORMAL HIGH (ref 40–60)
LDL Cholesterol: 172 mg/dL — ABNORMAL HIGH (ref 0–100)
Triglycerides: 159 mg/dL — ABNORMAL HIGH (ref 0–150)
VLDL Cholesterol Calculated: 32 mg/dL — ABNORMAL HIGH (ref 6–23)

## 2024-03-05 ENCOUNTER — Ambulatory Visit: Admit: 2024-03-05 | Discharge: 2024-03-05 | Payer: Medicare (Managed Care) | Attending: Medical | Primary: Medical

## 2024-03-05 VITALS — BP 112/78 | HR 61 | Temp 98.40000°F | Resp 16 | Ht 64.0 in | Wt 145.8 lb

## 2024-03-05 DIAGNOSIS — Z0000 Encounter for general adult medical examination without abnormal findings: Principal | ICD-10-CM

## 2024-03-05 MED ORDER — ROSUVASTATIN CALCIUM 40 MG PO TABS
40 | ORAL_TABLET | ORAL | 1 refills | 90.00000 days | Status: DC
Start: 2024-03-05 — End: 2024-07-23

## 2024-03-05 MED ORDER — FLUTICASONE PROPIONATE 50 MCG/ACT NA SUSP
50 | Freq: Two times a day (BID) | NASAL | 5 refills | Status: AC
Start: 2024-03-05 — End: ?

## 2024-03-05 MED ORDER — VENLAFAXINE HCL ER 150 MG PO CP24
150 | ORAL_CAPSULE | Freq: Every day | ORAL | 1 refills | 90.00000 days | Status: DC
Start: 2024-03-05 — End: 2024-08-25

## 2024-03-05 MED ORDER — EXACTECH TEST VI STRP
3 refills | Status: AC
Start: 2024-03-05 — End: ?

## 2024-03-05 MED ORDER — ONDANSETRON 4 MG PO TBDP
4 | ORAL_TABLET | Freq: Three times a day (TID) | ORAL | 1 refills | Status: AC | PRN
Start: 2024-03-05 — End: ?

## 2024-03-05 MED ORDER — GABAPENTIN 300 MG PO CAPS
300 | ORAL_CAPSULE | ORAL | 1 refills | 30.00000 days | Status: DC
Start: 2024-03-05 — End: 2024-08-25

## 2024-03-05 MED ORDER — PRAMIPEXOLE DIHYDROCHLORIDE 0.125 MG PO TABS
0.125 | ORAL_TABLET | ORAL | 1 refills | 90.00000 days | Status: DC
Start: 2024-03-05 — End: 2024-08-25

## 2024-03-05 MED ORDER — VITAMIN D (ERGOCALCIFEROL) 1.25 MG (50000 UT) PO CAPS
1.25 | ORAL_CAPSULE | ORAL | 1 refills | 84.00000 days | Status: DC
Start: 2024-03-05 — End: 2024-08-25

## 2024-03-05 MED ORDER — HYDROCHLOROTHIAZIDE 12.5 MG PO TABS
12.5 | ORAL_TABLET | ORAL | 1 refills | 90.00000 days | Status: DC
Start: 2024-03-05 — End: 2024-08-25

## 2024-03-05 MED ORDER — OMEPRAZOLE 40 MG PO CPDR
40 | ORAL_CAPSULE | Freq: Every day | ORAL | 1 refills | Status: AC
Start: 2024-03-05 — End: ?

## 2024-03-05 MED ORDER — FARXIGA 5 MG PO TABS
5 | ORAL_TABLET | Freq: Every morning | ORAL | 1 refills | 30.00000 days | Status: DC
Start: 2024-03-05 — End: 2024-08-25

## 2024-03-05 MED ORDER — SEMAGLUTIDE (2 MG/DOSE) 8 MG/3ML SC SOPN
8 | SUBCUTANEOUS | 1 refills | 28.00000 days | Status: DC
Start: 2024-03-05 — End: 2024-08-25

## 2024-03-05 MED ORDER — ESTRADIOL 10 MCG VA TABS
10 | ORAL_TABLET | VAGINAL | 3 refills | Status: AC
Start: 2024-03-05 — End: ?

## 2024-03-05 NOTE — Patient Instructions (Signed)
 Learning About Being Active as an Older Adult  Why is being active important as you get older?     Being active is one of the best things you can do for your health. And it's never too late to start. Being active--or getting active, if you aren't already--has definite benefits. It can:  Give you more energy,  Keep your mind sharp.  Improve balance to reduce your risk of falls.  Help you manage chronic illness with fewer medicines.  No matter how old you are, how fit you are, or what health problems you have, there is a form of activity that will work for you. And the more physical activity you can do, the better your overall health will be.  What kinds of activity can help you stay healthy?  Being more active will make your daily activities easier. Physical activity includes planned exercise and things you do in daily life. There are four types of activity:  Aerobic.  Doing aerobic activity makes your heart and lungs strong.  Includes walking, dancing, and gardening.  Aim for at least 2 hours spread throughout the week.  It improves your energy and can help you sleep better.  Muscle-strengthening.  This type of activity can help maintain muscle and strengthen bones.  Includes climbing stairs, using resistance bands, and lifting or carrying heavy loads.  Aim for at least twice a week.  It can help protect the knees and other joints.  Stretching.  Stretching gives you better range of motion in joints and muscles.  Includes upper arm stretches, calf stretches, and gentle yoga.  Aim for at least twice a week, preferably after your muscles are warmed up from other activities.  It can help you function better in daily life.  Balancing.  This helps you stay coordinated and have good posture.  Includes heel-to-toe walking, tai chi, and certain types of yoga.  Aim for at least 3 days a week.  It can reduce your risk of falling.  Even if you have a hard time meeting the recommendations, it's better to be more active  than less active. All activity done in each category counts toward your weekly total. You'd be surprised how daily things like carrying groceries, keeping up with grandchildren, and taking the stairs can add up.  What keeps you from being active?  If you've had a hard time being more active, you're not alone. Maybe you remember being able to do more. Or maybe you've never thought of yourself as being active. It's frustrating when you can't do the things you want. Being more active can help. What's holding you back?  Getting started.  Have a goal, but break it into easy tasks. Small steps build into big accomplishments.  Staying motivated.  If you feel like skipping your activity, remember your goal. Maybe you want to move better and stay independent. Every activity gets you one step closer.  Not feeling your best.  Start with 5 minutes of an activity you enjoy. Prove to yourself you can do it. As you get comfortable, increase your time.  You may not be where you want to be. But you're in the process of getting there. Everyone starts somewhere.  How can you find safe ways to stay active?  Talk with your doctor about any physical challenges you're facing. Make a plan with your doctor if you have a health problem or aren't sure how to get started with activity.  If you're already active, ask your doctor if  there is anything you should change to stay safe as your body and health change.  If you tend to feel dizzy after you take medicine, avoid activity at that time. Try being active before you take your medicine. This will reduce your risk of falls.  If you plan to be active at home, make sure to clear your space before you get started. Remove things like TV cords, coffee tables, and throw rugs. It's safest to have plenty of space to move freely.  The key to getting more active is to take it slow and steady. Try to improve only a little bit at a time. Pick just one area to improve on at first. And if an activity hurts,  stop and talk to your doctor.  Where can you learn more?  Go to [employer] and enter P600 to learn more about Learning About Being Active as an Older Adult.  Current as of: March 28, 2023  Content Version: 14.5   [address] [place].   Care instructions adapted under license by [facility]. If you have questions about a medical condition or this instruction, always ask your healthcare professional. Romayne Alderman, [facility], disclaims any warranty or liability for your use of this information.         Learning About Vision Tests  What are vision tests?     The four most common vision tests are visual acuity tests, refraction, visual field tests, and color vision tests.  Visual acuity (sharpness) tests  These tests are used:  To see if you need glasses or contact lenses.  To monitor an eye problem.  To check an eye injury.  Visual acuity tests are done as part of routine exams. You may also have this test when you get your driver's license or apply for some types of jobs.  Visual field tests  These tests are used:  To check for vision loss in any area of your range of vision.  To screen for certain eye diseases.  To look for nerve damage after a stroke, head injury, or other problem that could reduce blood flow to the brain.  Refraction and color tests  A refraction test is done to find the right prescription for glasses and contact lenses.  A color vision test is done to check for color blindness.  Color vision is often tested as part of a routine exam. You may also have this test when you apply for a job where recognizing different colors is important, such as truck driving, Optician, dispensing, or the [employer].  How are vision tests done?  Visual acuity test   You cover one eye at a time.  You read aloud from a wall chart across the room.  You read aloud from a small card that you hold in your hand.  Refraction   You look into a special device.  The device puts lenses of different  strengths in front of each eye to see how strong your glasses or contact lenses need to be.  Visual field tests   Your doctor may have you look through special machines.  Or your doctor may simply have you stare straight ahead while they move a finger into and out of your field of vision.  Color vision test   You look at pieces of printed test patterns in various colors. You say what number or symbol you see.  Your doctor may have you trace the number or symbol using a pointer.  How do these tests feel?  There is very little chance of having a problem from this test. If dilating drops are used for a vision test, they may make the eyes sting and cause a medicine taste in the mouth.  Follow-up care is a key part of your treatment and safety. Be sure to make and go to all appointments, and call your doctor if you are having problems. It's also a good idea to know your test results and keep a list of the medicines you take.  Where can you learn more?  Go to [employer] and enter G551 to learn more about Learning About Vision Tests.  Current as of: March 28, 2023  Content Version: 14.5   [address], [place].   Care instructions adapted under license by [facility]. If you have questions about a medical condition or this instruction, always ask your healthcare professional. Romayne Alderman, [facility], disclaims any warranty or liability for your use of this information.         A Healthy Heart: Care Instructions  Overview     Coronary artery disease, also called heart disease, occurs when a substance called plaque builds up in the vessels that supply oxygen-rich blood to your heart muscle. This can narrow the blood vessels and reduce blood flow. A heart attack happens when blood flow is completely blocked. A high-fat diet, smoking, and other factors increase the risk of heart disease.  Your doctor has found that you have a chance of having heart disease. A heart-healthy lifestyle can  help keep your heart healthy and prevent heart disease. This lifestyle includes eating healthy, being active, staying at a weight that's healthy for you, and not smoking or using tobacco. It also includes taking medicines as directed, managing other health conditions, and trying to get a healthy amount of sleep.  Follow-up care is a key part of your treatment and safety. Be sure to make and go to all appointments, and call your doctor if you are having problems. It's also a good idea to know your test results and keep a list of the medicines you take.  How can you care for yourself at home?  Diet    Use less salt when you cook and eat. This helps lower your blood pressure. Taste food before salting. Add only a little salt when you think you need it. With time, your taste buds will adjust to less salt.     Eat fewer snack items, fast foods, canned soups, and other high-salt, high-fat, processed foods.     Read food labels and try to avoid saturated and trans fats. They increase your risk of heart disease by raising cholesterol levels.     Limit the amount of solid fat--butter, margarine, and shortening--you eat. Use olive, peanut, or canola oil when you cook. Bake, broil, and steam foods instead of frying them.     Eat a variety of fruit and vegetables every day. Dark green, deep orange, red, or yellow fruits and vegetables are especially good for you. Examples include spinach, carrots, peaches, and berries.     Foods high in fiber can reduce your cholesterol and provide important vitamins and minerals. High-fiber foods include whole-grain cereals and breads, oatmeal, beans, brown rice, citrus fruits, and apples.     Eat lean proteins. Heart-healthy proteins include seafood, lean meats and poultry, eggs, beans, peas, nuts, seeds, and soy products.     Limit drinks and foods with added sugar. These include candy, desserts, and soda pop.   Heart-healthy lifestyle  If your doctor recommends it, get more exercise. For  many people, walking is a good choice. Or you may want to swim, bike, or do other activities. Bit by bit, increase the time you're active every day. Try for at least 30 minutes on most days of the week.     Try to quit or cut back on using tobacco and other nicotine products. This includes smoking and vaping. If you need help quitting, talk to your doctor about stop-smoking programs and medicines. These can increase your chances of quitting for good. Quitting is one of the most important things you can do to protect your heart. It is never too late to quit. Try to avoid secondhand smoke too.     Stay at a weight that's healthy for you. Talk to your doctor if you need help losing weight.     Try to get 7 to 9 hours of sleep each night.     Limit alcohol to 2 drinks a day for men and 1 drink a day for women. Too much alcohol can cause health problems.     Manage other health problems such as diabetes, high blood pressure, and high cholesterol. If you think you may have a problem with alcohol or drug use, talk to your doctor.   Medicines    Take your medicines exactly as prescribed. Call your doctor if you think you are having a problem with your medicine.     If your doctor recommends aspirin, take the amount directed each day. Make sure you take aspirin and not another kind of pain reliever, such as acetaminophen (Tylenol).   When should you call for help?   Call 911 if you have symptoms of a heart attack. These may include:    Chest pain or pressure, or a strange feeling in the chest.     Sweating.     Shortness of breath.     Pain, pressure, or a strange feeling in the back, neck, jaw, or upper belly or in one or both shoulders or arms.     Lightheadedness or sudden weakness.     A fast or irregular heartbeat.   After you call 911, the operator may tell you to chew 1 adult-strength or 2 to 4 low-dose aspirin. Wait for an ambulance. Do not try to drive yourself.  Watch closely for changes in your health, and be sure  to contact your doctor if you have any problems.  Where can you learn more?  Go to [employer] and enter F075 to learn more about A Healthy Heart: Care Instructions.  Current as of: March 28, 2023  Content Version: 14.5   [address], [place].   Care instructions adapted under license by [facility]. If you have questions about a medical condition or this instruction, always ask your healthcare professional. Romayne Alderman, [facility], disclaims any warranty or liability for your use of this information.    Personalized Preventive Plan for Christy Cochran - 03/05/2024  Medicare offers a range of preventive health benefits. Some of the tests and screenings are paid in full while other may be subject to a deductible, co-insurance, and/or copay.  Some of these benefits include a comprehensive review of your medical history including lifestyle, illnesses that may run in your family, and various assessments and screenings as appropriate.  After reviewing your medical record and screening and assessments performed today your provider may have ordered immunizations, labs, imaging, and/or referrals for you.  A list of these  orders (if applicable) as well as your Preventive Care list are included within your After Visit Summary for your review.

## 2024-03-05 NOTE — Progress Notes (Addendum)
 Christy Cochran (DOB: June 30, 1956) is a 68 y.o. female, an established patient, is here for evaluation of the following chief complaint(s):  Chief Complaint   Patient presents with    Medicare AWV    Hypertension    Diabetes     Diabetic foot exam    Results    Discuss Medications     Has been off Crestor  past month     Medication Refill          ASSESSMENT/PLAN:     Diagnosis Orders   1. Medicare annual wellness visit, subsequent        2. Hypertension, essential, benign  hydroCHLOROthiazide  12.5 MG tablet      3. Type 2 diabetes mellitus with hyperglycemia, without long-term current use of insulin  (HCC)  FARXIGA  5 MG tablet      4. Type 2 diabetes mellitus with diabetic neuropathy, without long-term current use of insulin  (HCC)  FARXIGA  5 MG tablet    semaglutide , 2 MG/DOSE, (OZEMPIC ) 8 MG/3ML SOPN sc injection      5. Dyslipidemia  rosuvastatin  (CRESTOR ) 40 MG tablet      6. Vitamin D  deficiency  vitamin D  (ERGOCALCIFEROL ) 1.25 MG (50000 UT) CAPS capsule      7. Postmenopausal atrophic vaginitis  Estradiol  (VAGIFEM ) 10 MCG TABS vaginal tablet      8. Seasonal allergic rhinitis due to pollen  fluticasone  (FLONASE ) 50 MCG/ACT nasal spray      9. Fibromyalgia  gabapentin  (NEURONTIN ) 300 MG capsule      10. Neuropathy  gabapentin  (NEURONTIN ) 300 MG capsule      11. Meniere's disease of both ears  hydroCHLOROthiazide  12.5 MG tablet      12. Gastroesophageal reflux disease without esophagitis  omeprazole  (PRILOSEC) 40 MG delayed release capsule      13. Nausea  ondansetron  (ZOFRAN -ODT) 4 MG disintegrating tablet      14. Restless leg syndrome  pramipexole  (MIRAPEX ) 0.125 MG tablet      15. Anxiety state  venlafaxine  (EFFEXOR  XR) 150 MG extended release capsule      16. Strain of left ankle, initial encounter  [facility] - Physical Therapy, [facility]      17. Chronic bilateral low back pain without sciatica  [facility] - Physical Therapy, [facility]      18. LLQ pain  CT ABDOMEN W WO CONTRAST         Assessment & Plan  1. Hypertension.  - Blood pressure readings have been consistently low, likely due to weight loss reducing the need for antihypertensive medications.  - Physical exam: BP 112/78.  - Discussion: Losartan  will be discontinued while continuing hydrochlorothiazide  for Meniere's disease.  - Treatment: If low blood pressure readings persist after stopping losartan , she should inform us .    2. Diabetes Mellitus.  - Hemoglobin A1c has improved from 7.4% to 6.9%, indicating good blood sugar control.  - Physical exam: No new findings.  - Discussion: Will continue current regimen of Ozempic  2 mg once a week and Farxiga  5 mg daily.  - Treatment: Prescription for [employer] test strips will be provided.    3. Hyperlipidemia.  - Cholesterol levels have increased significantly, with a current reading of 276 mg/dL.  - Physical exam: No new findings.  - Discussion: Will resume rosuvastatin  40 mg daily.  - Treatment: Prescription for rosuvastatin  40 mg daily will be sent.    4. Vitamin D  deficiency.  - Vitamin D  level is at the low end of the  normal range at 38.6 ng/mL.  - Physical exam: No new findings.  - Discussion: Will restart vitamin D  50,000 international units once a week and continue over-the-counter vitamin D3 2000 IU daily.  - Treatment: Prescription for vitamin D  50,000 IU once a week and OTC vitamin D3 2000 IU daily.    5. Left lower quadrant pain.  - Pain could be indicative of diverticulosis or diverticulitis.  - Physical exam: Tenderness in the left lower quadrant.  - Discussion: A CT scan of the abdomen will be ordered to further investigate the cause of the pain.  - Treatment: CT abdomen will be scheduled.    6. Bilateral chronic lower back pain without sciatica.  - Pain is chronic and exacerbated by prolonged sitting.  - Physical exam: No new findings.  - Discussion: Advised to use anti-inflammatories like Advil or Motrin and apply heat to the affected area.  - Treatment: Physical therapy will  be arranged to address her lower back pain.    7. Foot pain.  - Reports increased stabbing pain on the tops of her feet since her last visit.  - Physical exam: Decreased sensation in the left foot, normal circulation.  - Discussion: Pain is new since the last visit in March.  - Treatment: Will have her continue taking Gabapentin  300mg  BID and if symptoms are still bothering her at her next visit, may consider an adjustment.  (Prefer to avoid increasing at this time since she is also taking Pramixapole (for RLS), which will have a cumulative sedating effect.     8. Medication Management.  - Will switch from estradiol  vaginal cream to vaginal tablets, to be used twice weekly.   - Discussion: Prescription for Zofran  21 tablets with a refill will be provided.    9. Health Maintenance.  - Due for an RSV vaccine, which can be administered at her local pharmacy.  - Physical exam: No new findings.  - Discussion: Encouraged to increase physical activity and regular exercise and to ensure working smoke detectors are installed in her home.  - Treatment: RSV vaccine to be administered at the pharmacy.    10. Left ankle strain.  - Injury occurred about a month ago, still causing pain.  - Physical exam: No new findings.  - Discussion: Physical therapy will be arranged to help with her left ankle strain.  - Treatment: Physical therapy referral will be made.    Follow-up  - A follow-up visit is scheduled in 6 months.        Physical Exam  Constitutional:       Appearance: Normal appearance. She is normal weight.   HENT:      Head: Normocephalic and atraumatic.   Eyes:      Pupils: Pupils are equal, round, and reactive to light.   Cardiovascular:      Rate and Rhythm: Normal rate and regular rhythm.      Heart sounds: Normal heart sounds.   Pulmonary:      Effort: Pulmonary effort is normal.      Breath sounds: Normal breath sounds.   Musculoskeletal:      Lumbar back: No swelling, spasms or bony tenderness.        Back:       Left  ankle: Normal. No swelling, deformity or ecchymosis. No tenderness. Normal range of motion. Normal pulse.   Skin:     General: Skin is warm and dry.   Neurological:      General: No focal deficit  present.      Mental Status: She is alert and oriented to person, place, and time.   Psychiatric:         Mood and Affect: Mood normal.         Behavior: Behavior normal.         Thought Content: Thought content normal.         Judgment: Judgment normal.     Diabetic foot exam:   Left Foot:   Visual Exam: normal   Pulse DP: 2+ (normal)   Filament test: 3/6     Right Foot:   Visual Exam: normal   Pulse DP: 2+ (normal)   Filament test: 6/6       Follow Up    Return for 24 weeks HTN/DM/HLD/Vit D mgt w/ labs prior to visit.     SUBJECTIVE/OBJECTIVE:  History of Present Illness  The patient presents for a Medicare wellness visit.    She has been experiencing low blood pressure, with readings as low as 90 systolic, causing dizziness. This has been ongoing for about a month. She is currently on hydrochlorothiazide  for Meniere's disease and losartan  for hypertension. She reports a weight loss of 6 pounds since October 2024, which may be contributing to her reduced need for blood pressure medications.    She has not had a diabetic eye exam in the last 6 months. She is currently on Ozempic  2 mg once a week and Farxiga  5 mg for diabetes management. Her hemoglobin A1c has improved from 7.4% in January 2025 to 6.9%.    She has not been receiving her rosuvastatin  due to a change in pharmacy, resulting in an increase in her cholesterol levels from 162 to 276.    She has been experiencing left lower quadrant pain for about 3 months, which she describes as more uncomfortable than severe. She occasionally experiences constipation but does not have diarrhea. She is unsure if the pain is related to her ovary or another issue. She has been tested for diverticulosis or diverticulitis in the past but does not recall the results.    She has been  experiencing bilateral lower back pain for about 20 years, which is exacerbated by prolonged sitting. She does not have any radiating leg pain. She occasionally takes anti-inflammatories and applies heat to the area.    She has been experiencing increased burning, tingling, and numbness in her feet, along with stabbing pain on the tops of her feet. This is a new symptom since her last visit in 10/2023.    She was previously prescribed two different types of vaginal cream, one of which she believes she may be allergic to. She would like to switch back to the tablet form as she finds the cream messy. She is currently using Flonase  nasal spray as needed and continues to take gabapentin  for fibromyalgia and neuropathy. She also takes omeprazole  40 mg once a day for acid reflux, which she reports is well controlled. She occasionally needs Zofran  and takes Mirapex  3 tablets at bedtime for restless leg symptoms, which she reports are well controlled. She has not been taking vitamin D3 2000 IU daily for some time.    About a month ago, she was almost knocked down by a pit bull and a wild boar (from her neighbor's house), resulting in soreness in her ankle. She initially thought it was a strain or sprain and was unable to walk on it for the first 2 days.    PAST SURGICAL HISTORY:  Colonoscopy in 2018          Vitals:    03/05/24 1028   BP: 112/78   Pulse: 61   Resp: 16   Temp: 98.4 F (36.9 C)   TempSrc: Oral   SpO2: 98%   Weight: 66.1 kg (145 lb 12.8 oz)   Height: 1.626 m (5' 4)      Last Weight Metrics:      03/05/2024    10:28 AM 10/29/2023     9:52 AM 09/13/2023     2:32 PM 06/13/2023    10:02 AM 03/20/2023     8:37 AM 12/29/2022    10:57 AM 11/17/2022     9:09 AM   Weight Loss Metrics   Height 5' 4 5' 4 5' 4 5' 4 5' 4 5' 4 5' 4   Weight - Scale 145 lbs 13 oz 149 lbs 147 lbs 151 lbs 161 lbs 3 oz 164 lbs 169 lbs   BMI (Calculated) 25.1 kg/m2 25.6 kg/m2 25.3 kg/m2 26 kg/m2 27.7 kg/m2 28.2 kg/m2 29.1 kg/m2         Orders  Placed This Encounter    CT ABDOMEN W WO CONTRAST     Standing Status:   Future     Expected Date:   03/05/2024     Expiration Date:   03/05/2025     Additional Contrast?:   Radiologist Recommendation     STAT Creatinine as needed::   Yes     Reason for exam::   LLQ Pain x 3 mos    [facility] - Physical Therapy, [facility]     Referral Priority:   Routine     Referral Type:   Physical Therapy     Referral Reason:   Specialty Services Required     Requested Specialty:   Physical Therapy     Number of Visits Requested:   1    vitamin D  (ERGOCALCIFEROL ) 1.25 MG (50000 UT) CAPS capsule     Sig: Take 1 capsule by mouth once a week     Dispense:  12 capsule     Refill:  1    rosuvastatin  (CRESTOR ) 40 MG tablet     Sig: TAKE 1 TABLET BY MOUTH EVERY DAY     Dispense:  90 tablet     Refill:  1    blood glucose test strips (EXACTECH TEST) strip     Sig: Test BS 1-2 daily As needed.     Dispense:  100 each     Refill:  3    Estradiol  (VAGIFEM ) 10 MCG TABS vaginal tablet     Sig: Place 1 tablet vaginally Twice a Week     Dispense:  24 tablet     Refill:  3     Please d/c any orders on file for Estradiol  Vaginal Cream    FARXIGA  5 MG tablet     Sig: Take 1 tablet by mouth every morning     Dispense:  90 tablet     Refill:  1    fluticasone  (FLONASE ) 50 MCG/ACT nasal spray     Sig: 1 spray by Nasal route in the morning and at bedtime     Dispense:  16 g     Refill:  5     Please place rx on file until pt. Requests refill.    gabapentin  (NEURONTIN ) 300 MG capsule     Sig: TAKE 1 CASPULE BY MOUTH TWICE DAILY  Dispense:  180 capsule     Refill:  1    hydroCHLOROthiazide  12.5 MG tablet     Sig: TAKE 1 TABLET BY MOUTH EVERY DAY     Dispense:  90 tablet     Refill:  1     Please d/c any orders on file for HCTZ 25 mg    omeprazole  (PRILOSEC) 40 MG delayed release capsule     Sig: Take 1 capsule by mouth daily     Dispense:  90 capsule     Refill:  1    ondansetron  (ZOFRAN -ODT) 4 MG disintegrating tablet     Sig: Take 1 tablet  by mouth 3 times daily as needed for Nausea or Vomiting     Dispense:  21 tablet     Refill:  1     Please place rx on file until pt. Requests refill.    pramipexole  (MIRAPEX ) 0.125 MG tablet     Sig: TAKE 3 TABLETS BY MOUTH EVERY HS     Dispense:  270 tablet     Refill:  1    semaglutide , 2 MG/DOSE, (OZEMPIC ) 8 MG/3ML SOPN sc injection     Sig: Inject 2 mg into the skin every 7 days     Dispense:  9 mL     Refill:  1     Please d/c any orders on file for Trulicity     venlafaxine  (EFFEXOR  XR) 150 MG extended release capsule     Sig: Take 1 capsule by mouth daily     Dispense:  90 capsule     Refill:  1     Please place rx on file until pt. Requests refill.         An electronic signature was used to authenticate this note.  -- Deland Robinsons, PA     Part of this note was written by using a voice dictation software. The note has been proof read but may still contain some grammatical/other typographical errors.   AND/OR:  The patient (or guardian, if applicable) and other individuals in attendance with the patient were advised that Artificial Intelligence will be utilized during this visit to record, process the conversation to generate a clinical note, and support improvement of the AI technology. The patient (or guardian, if applicable) and other individuals in attendance at the appointment consented to the use of AI, including the recording.          Medicare Annual Wellness Visit         Subjective       Patient's complete Health Risk Assessment and screening values have been reviewed and are found in Flowsheets. The following problems were reviewed today and where indicated follow up appointments were made and/or referrals ordered.    Positive Risk Factor Screenings with Interventions:              Inactivity:  On average, how many days per week do you engage in moderate to strenuous exercise (like a brisk walk)?: 2 days (!) Abnormal     Interventions:  See AVS for additional education material         Vision Screen:  Do you have difficulty driving, watching TV, or doing any of your daily activities because of your eyesight?: No  Have you had an eye exam within the past year?: (!) No  Interventions:   Patient encouraged to make appointment with their eye specialist    Safety:  Do you have working smoke detectors?: (!) No  Interventions:  See AVS for additional education material          Advance Care Planning  Discussed the patient's choices for care and treatment preferences in case of a health event that adversely affects decision-making abilities or is life-limiting.   Recommended the patient document care preferences in state-specific advance directives. Also reviewed the process of designating a trusted capable adult as an Agent (or Health Care Power of Gabriella) to make health care decisions for the patient if the patient becomes unable due to incapacity. Patient was asked to complete advance directive forms, if not already done, and to provide a dated, signed and witnessed (or notarized) copy, per the forms' requirements, to the practice office.    Spent 16 minutes on Advanced care planning with patient.             Objective   Vitals:    03/05/24 1028   BP: 112/78   Pulse: 61   Resp: 16   Temp: 98.4 F (36.9 C)   TempSrc: Oral   SpO2: 98%   Weight: 66.1 kg (145 lb 12.8 oz)   Height: 1.626 m (5' 4)      Body mass index is 25.03 kg/m.                 Allergies   Allergen Reactions    Cephalexin Hives and Other (See Comments)    Nitrofurantoin Hives, Nausea And Vomiting and Other (See Comments)     Blisters      Sulfa Antibiotics Hives and Itching    Pentazocine-Naloxone Hcl Other (See Comments)    Adhesive Tape Other (See Comments) and Hives     Tears skins  Tears skins      Codeine Itching and Other (See Comments)     Prior to Visit Medications    Medication Sig Taking? Authorizing Provider   vitamin D  (ERGOCALCIFEROL ) 1.25 MG (50000 UT) CAPS capsule Take 1 capsule by mouth once a week Yes  Bailey-Nutting, Cashay Manganelli, PA   rosuvastatin  (CRESTOR ) 40 MG tablet TAKE 1 TABLET BY MOUTH EVERY DAY Yes Bailey-Nutting, Shaleigh Laubscher, PA   blood glucose test strips (EXACTECH TEST) strip Test BS 1-2 daily As needed. Yes Bailey-Nutting, Dennison, PA   Estradiol  (VAGIFEM ) 10 MCG TABS vaginal tablet Place 1 tablet vaginally Twice a Week Yes Bailey-Nutting, Malon Branton, PA   FARXIGA  5 MG tablet Take 1 tablet by mouth every morning Yes Bailey-Nutting, Lenette Rau, PA   fluticasone  (FLONASE ) 50 MCG/ACT nasal spray 1 spray by Nasal route in the morning and at bedtime Yes Bailey-Nutting, Nisha Dhami, PA   gabapentin  (NEURONTIN ) 300 MG capsule TAKE 1 CASPULE BY MOUTH TWICE DAILY Yes Bailey-Nutting, Jhon Mallozzi, PA   hydroCHLOROthiazide  12.5 MG tablet TAKE 1 TABLET BY MOUTH EVERY DAY Yes Bailey-Nutting, Tawonda Legaspi, PA   omeprazole  (PRILOSEC) 40 MG delayed release capsule Take 1 capsule by mouth daily Yes Bailey-Nutting, Yehudis Monceaux, PA   ondansetron  (ZOFRAN -ODT) 4 MG disintegrating tablet Take 1 tablet by mouth 3 times daily as needed for Nausea or Vomiting Yes Bailey-Nutting, Dhwani Venkatesh, PA   pramipexole  (MIRAPEX ) 0.125 MG tablet TAKE 3 TABLETS BY MOUTH EVERY HS Yes Bailey-Nutting, Nadja Lina, PA   semaglutide , 2 MG/DOSE, (OZEMPIC ) 8 MG/3ML SOPN sc injection Inject 2 mg into the skin every 7 days Yes Bailey-Nutting, Lenin Kuhnle, PA   venlafaxine  (EFFEXOR  XR) 150 MG extended release capsule Take 1 capsule by mouth daily Yes Bailey-Nutting, Myasia Sinatra, PA   vitamin D  (VITAMIN D3) 50 MCG (2000 UT) CAPS capsule Take 2 capsules by mouth  daily Yes [provider]   calcium  acetate (PHOSLO) 667 MG CAPS capsule TAKE 1 CAPSULE BY MOUTH THREE TIMES DAILY BEFORE MEALS Yes [provider]   Aspirin 81 MG CAPS Take 81 mg by mouth every evening Yes [provider]   DANDELION ROOT PO Take 500 mg by mouth Yes [provider]   loratadine (CLARITIN) 10 MG capsule Take by mouth daily Yes [provider]       CareTeam (Including outside  providers/suppliers regularly involved in providing care):   Patient Care Team:  Gideon Peters, PA as PCP - General (Physician Assistant)  Gideon Peters, PA as PCP - Empaneled Provider     Recommendations for Preventive Services Due: see orders and patient instructions/AVS.  Recommended screening schedule for the next 5-10 years is provided to the patient in written form: see Patient Instructions/AVS.     Reviewed and updated this visit:  Tobacco  Allergies  Meds  Problems

## 2024-03-17 ENCOUNTER — Ambulatory Visit: Admit: 2024-03-17 | Discharge: 2024-03-21 | Payer: Medicare (Managed Care) | Primary: Medical

## 2024-03-17 DIAGNOSIS — R1032 Left lower quadrant pain: Principal | ICD-10-CM

## 2024-03-17 MED ORDER — IOPAMIDOL 76 % IV SOLN
76 | Freq: Once | INTRAVENOUS | Status: AC | PRN
Start: 2024-03-17 — End: 2024-03-17
  Administered 2024-03-17: 19:00:00 100 mL via INTRAVENOUS

## 2024-03-17 MED ORDER — DIATRIZOATE MEGLUMINE & SODIUM 66-10 % PO SOLN
66-10 | Freq: Once | ORAL | Status: AC | PRN
Start: 2024-03-17 — End: ?
  Administered 2024-03-17: 17:00:00 15 mL via ORAL

## 2024-03-19 ENCOUNTER — Encounter

## 2024-03-19 MED ORDER — AMOXICILLIN-POT CLAVULANATE 875-125 MG PO TABS
875-125 | ORAL_TABLET | Freq: Two times a day (BID) | ORAL | 0 refills | Status: AC
Start: 2024-03-19 — End: 2024-03-29

## 2024-03-19 NOTE — Other (Signed)
 Please call and let patient know that her CT showed wall thickening around her rectum and colon that could be indicative for a colitis. I will send Augmentin  twice daily for 10 days to complete. Recommend a liquid diet to allow the bowel to rest. I'd also recommend a laxative or stool softener as there was moderate stool noted.     She also had a very small adrenal nodule noted that can be repeated with CT in one year.

## 2024-03-24 ENCOUNTER — Inpatient Hospital Stay: Admit: 2024-03-24 | Payer: Medicare (Managed Care) | Primary: Medical

## 2024-03-24 DIAGNOSIS — R262 Difficulty in walking, not elsewhere classified: Principal | ICD-10-CM

## 2024-03-24 NOTE — Other (Signed)
 Christy Cochran  DOB: 1956-07-04  Primary: [facility] (Medicare Managed)  Secondary:  [facility] [place]  [address]  [facility]  [place] [address]  Phone: [phone]  Fax: [phone] Plan Frequency: 1-2 sessions per week for 6 weeks    Plan of Care/Certification Expiration Date: 05/24/24        Plan of Care/Certification Expiration Date:  Plan of Care/Certification Expiration Date: 05/24/24    Frequency/Duration: Plan Frequency: 1-2 sessions per week for 6 weeks      Time In/Out:   Time In: 1015  Time Out: 1059      PT Visit Info:         Visit Count:  1                OUTPATIENT PHYSICAL THERAPY:             Initial Assessment 03/24/2024               Episode (L Ankle Pain)         Treatment Diagnosis:     Difficulty walking  Pain in left ankle and joints of left foot  Medical/Referring Diagnosis:    Strain of left ankle, initial encounter [D03.087J]  Chronic bilateral low back pain without sciatica [M54.50, G89.29]      Referring Physician:  Gideon Peters, PA    MD Orders:  PT Eval and Treat   Return MD Appt:  TBD  Date of Onset:  Onset Date: 02/10/24 (Incident occured 6 weeks ago)     Allergies:  Cephalexin, Nitrofurantoin, Sulfa antibiotics, Pentazocine-naloxone hcl, Adhesive tape, and Codeine  Restrictions/Precautions:    None      Medications Last Reviewed: 03/24/2024     SUBJECTIVE   History of Injury/Illness (Reason for Referral):  Ms. Christy Cochran has attended 1 physical therapy session including initial evaluation as of 03/24/2024. Christy Cochran is a 68 y.o. female with complaints of L ankle pain. Patient was gardening when a wild boar ran towards her which was being chased by a pitbull hunting dog. One of the animals struck her leg and may have caused her to twist her ankle. This occurred about 6 weeks ago. Patient reports the pain is at the L lateral ankle and lower leg. She also reports low back pain which radiates up her back and down her  leg. she is not certain if it is coming from her low back or her ankle.   Patient Stated Goal(s):  Patient wishes to return to cleaning her home without pain  Initial Pain Level:      2/10   Post Session Pain Level:     2/10  Past Medical History/Comorbidities:   Ms. Christy Cochran  has a past medical history of Anxiety, Depression, Diabetes mellitus (HCC), Fibromyalgia, Hyperlipidemia, Meniere disease, Osteoporosis, and Restless leg.  Christy Cochran  has a past surgical history that includes Wrist fusion (11/2020); Bunionectomy; Cholecystectomy; Shoulder Arthrotomy; Hysterectomy, total abdominal; Tubal ligation; and Breast enhancement surgery.  Social History/Living Environment:   Patient lives with their family  Type of Home: House: One Story    Prior Level of Function/Work/Activity:   Prior Level of Function: Independent   Current Level of Function: Independent      Learning:   Does the patient/guardian have any barriers to learning?: No barriers  What is the preferred language of the patient/guardian?: English  How does the patient/guardian prefer to learn new concepts?: Listening; Reading; Demonstration; Pictures/Videos  Fall Risk Scale:   Morse Total Score: 0    Dominant Side:  right handed     OBJECTIVE               ROM   Date:  03/24/2024   ANKLE ROM (TESTED IN SUPINE)    RIGHT LEFT   DF +5 +3   PF 50 50   IV 45 45   EV 0 +5     STRENGTH   Date: 03/24/2024     Right Left   Hip Abduction 4+ 4+   Hip IR 5 5   Hip ER 4+ 4+   Hip Flexion 4+ 4   Knee Extension 5 5   Knee Flexion 5 5   Ankle DF 5 5, painful   Ankle PF 5 4+, painful   Ankle IV 5 4+, painful   Ankle EV 5 4+, painful     BALANCE   Date: 03/24/2024   Right --   Left --     EDEMA   Date:  03/24/2024      Activity/Exercise Left  Right   Figure 8 49 cm 49 cm     FUNCTIONAL MOBILITY   Date:   03/24/2024   Transfers Independent    Gait deviations Mildly antalgic gait   Assistive device None   Stairs NA       Outcome Measure:   Tool Used: FOOT AND ANKLE ABILITY  MEASURE  Score:  Initial: 58 Most Recent: X (Date: -- )   Interpretation of Score: For the Activities of Daily Living, there are 21 questions each scored on a 5 point scale with 0 representing Unable to do and 4 representing No difficulty.  The lower the score, the greater the functional disability. 84/84 represents no disability.  Minimal detectable change is 5.7 points.  With the addition of the 8 questions in the Sports Subscale, there are 29 questions, each scored on a 5 point scale with 0 representing Unable to do and 4 representing No difficulty.  The lower the score, the greater the functional disability. 116/116 represents no disability.  Minimal detectable change is 12.3 points.    ASSESSMENT   Initial Assessment:    Ms. Christy Cochran has attended 1 physical therapy session including initial evaluation as of 03/24/2024. Christy Cochran is a 68 y.o. female  presents with increased L ankle pain, decreased ankle ROM, decreased strength, decreased functional mobility, and decreased pain tolerance. Patient reports pain with ankle inversion and eversion. Her L anterlolateral ankle is tender to palpation. There is mild swelling at the ATFL site.  Christy Cochran will benefit from a home exercise program, therapeutic activities, postural strengthening exercises, manual therapeutic techniques, modalities, and pain modulation interventions as appropriate to address their current condition.  Christy Cochran will benefit from skilled physical therapy (medically necessary) to address above deficits affecting participation in basic ADLs and overall functional tolerance.    Therapy Problem List: (Impacting functional limitations):    Increased Pain, Decreased Strength, Decreased ROM, Decreased Functional Mobility, Decreased Independence with Home Exercise Program, Decreased Posture, Decreased Body Mechanics, Decreased Activity Tolerance/Endurance*, and Edema/Girth   Therapy Prognosis:   Good     Initial Assessment  Complexity:   Low Complexity       PLAN   Effective Dates: 03/24/2024 TO Plan of Care/Certification Expiration Date: 05/24/24     Frequency/Duration: Plan Frequency: 1-2 sessions per week for 6 weeks      Interventions Planned (Treatment may consist of any combination  of the following):    Location manager, Teaching laboratory technician, Building services engineer, Investment banker, operational, Home Exercise Program (HEP), Manual Therapy, Neuromuscular Re-education/Strengthening, Pain Management, Range of Motion (ROM), Therapeutic Activites, Therapeutic Exercise/Strengthening, and Dry Needling   GOALS:   (Goals have been discussed and agreed upon with patient.) STATUS AS OF   03/24/2024    Short-term Goals : 4 weeks  PROGRESSING MET NOT MET   Christy Cochran will be compliant with home exercise program within 4 weeks in order to improve active participation with management of patient's symptoms and/or functional deficits.  []   []   []     Christy Cochran will report <=3/10 pain with walking >15 minutes in order to participate in daily exercise and daily activities.  []   []   []     Christy Cochran will be able to stand >=15 minutes with <2/10 pain to feet in order to participate in household duties without issues/compromise. []   []   []     Christy Cochran will report being able to sleep through the night without waking up secondary to L foot pain. []   []   []     Christy Cochran  will improve L ankle dorsiflexion AROM to +5 in order to show improvement in ankle ROM and tolerance for functional activity.  []   []   []     Christy Cochran will be report improved score on the Foot and Ankle Ability Measure from 58 to 63 to indicate improvement in functional independence. []   []   []     Discharge Goals: 12 weeks     Christy Cochran will report <=2/10 pain with participation in activities of daily living and overall functional mobility including walking and stair ambulation. []   []   []     Maudy Yonan will be able to stand >=30 minutes without reports of increased  foot/ankle pain. []   []   []     Rendy Lazard will be report improved score on the Foot and Ankle Ability Measure from 58 to 68 to indicate improvement in functional independence. []   []   []     Kaisyn Reinhold will improve ankle strength to >=4+/5 to improve tolerance of ADLs and improve overall functional mobility. []   []   []               Medical Necessity:   Patient is expected to demonstrate progress in strength, range of motion, balance, coordination and functional technique to return to prior level of function.  Skilled intervention continues to be required due to decreased ROM, muscle weakness, decreased functional mobility, impaired gait, decreased posture, and increased pain.    Reason for Services/other comments:  Patient continues to require skilled intervention due to above deficits affecting participation in basic ADLs and overall functional tolerance.       Regarding Bebe Moncure therapy, I certify that the treatment plan above will be carried out by a therapist or under their direction.  Thank you for this referral,  Norman Bier, PT     Referring Physician Signature: Gideon Peters* _______________________________ Date _____________        Charge Capture  Events  Appt Desk  Attendance Report

## 2024-03-24 NOTE — Progress Notes (Signed)
 Christy Cochran  DOB: 09-14-1955  Primary: [facility] (Medicare Managed)  Secondary:  [facility] [place]  [address]  [facility] [employer]  [place] [address]  Phone: [phone]  Fax: [phone] Plan Frequency: 1-2 sessions per week for 6 weeks  Plan of Care/Certification Expiration Date: 05/24/24        Plan of Care/Certification Expiration Date:  Plan of Care/Certification Expiration Date: 05/24/24    Frequency/Duration: Plan Frequency: 1-2 sessions per week for 6 weeks      Time In/Out:   Time In: 1015  Time Out: 1059      PT Visit Info:         Visit Count:  1    OUTPATIENT PHYSICAL THERAPY:   Treatment Note 03/24/2024       Episode  (L Ankle Pain)               Treatment Diagnosis:    Difficulty walking  Pain in left ankle and joints of left foot  Medical/Referring Diagnosis:    Strain of left ankle, initial encounter [D03.087J]  Chronic bilateral low back pain without sciatica [M54.50, G89.29]      Referring Physician:  Gideon Peters, PA  MD Orders:  PT Eval and Treat  Return MD Appt:  TBD   Date of Onset:  Onset Date: 02/10/24 (Incident occured 6 weeks ago)     Allergies:   Cephalexin, Nitrofurantoin, Sulfa antibiotics, Pentazocine-naloxone hcl, Adhesive tape, and Codeine  Restrictions/Precautions:   None      Interventions Planned (Treatment may consist of any combination of the following):     See Assessment Note    Subjective Comments:   Patient reports L ankle pain  Initial Pain Level::     2/10  Post Session Pain Level:       2/10  Medications Last Reviewed:  03/24/2024  Updated Objective Findings:  See Evaluation Note from today  Treatment   THERAPEUTIC EXERCISE: (15 minutes):    Exercises per grid below to improve mobility, strength, balance, and coordination.  Required moderate visual, verbal, manual, and tactile cues to promote proper body alignment, promote proper body posture, promote proper body mechanics, and promote proper body breathing techniques.   Progressed resistance, range, repetitions, and complexity of movement as indicated.   Date:  03/24/2024 Date:   Date:     Activity/Exercise Parameters Parameters Parameters   Ankle PF Long sitting, red band, 2x10     Heel Raises Seated EOB, 2x10     Ankle Isometrics Seated inversion against rolled towel, 10x3 hold                       Education Treatment, home exercise plan, plan of care, prognosis, pain modulation      Medbridge Access Code: [email or web address]    Treatment/Session Summary:    Treatment Assessment:   See evaluation note from today for objective data. Patient tolerated interventions well. They understood all HEP exercises.    Communication/Consultation:  Therapy Evaluation sent to referring provider  Equipment provided today:  HEP and Theraband  Recommendations/Intent for next treatment session: Next visit will focus on advancements to more challenging activities to include progressing strength, functional mobility, pain tolerance, and ROM as tolerated.    >Total Treatment Billable Duration:  40 minutes: 25 minutes evaluation; 15 minutes TE  Time In: 1015  Time Out: 1059    Curtistine Boss, PT         Charge Capture  Events  MedBridge Portal  Appt Desk  Attendance Report     Future Appointments   Date Time Provider Department Center   08/11/2024 10:15 AM [facility] LAB [facility]   08/25/2024 10:00 AM Bailey-Nutting, Maureen, PA [facility]

## 2024-03-28 NOTE — Progress Notes (Signed)
 Patient's HM shows they are overdue for Colorectal Screening.   Care Everywhere and Media Manager files searched.  HM updated.

## 2024-04-02 ENCOUNTER — Encounter: Payer: Medicare (Managed Care) | Primary: Medical

## 2024-04-07 ENCOUNTER — Inpatient Hospital Stay: Admit: 2024-04-07 | Payer: Medicare (Managed Care) | Primary: Medical

## 2024-04-07 NOTE — Progress Notes (Signed)
 Christy Cochran  DOB: October 11, 1955  Primary: [facility] (Medicare Managed)  Secondary:  [facility] [place]  [address]  [facility] [employer]  [place] [address]  Phone: [phone]  Fax: [phone] Plan Frequency: 1-2 sessions per week for 6 weeks  Plan of Care/Certification Expiration Date: 05/24/24        Plan of Care/Certification Expiration Date:  Plan of Care/Certification Expiration Date: 05/24/24    Frequency/Duration: Plan Frequency: 1-2 sessions per week for 6 weeks      Time In/Out:   Time In: 1100  Time Out: 1205      PT Visit Info:    Progress Note Counter: 2      Visit Count:  2    OUTPATIENT PHYSICAL THERAPY:   Treatment Note 04/07/2024       Episode  (L Ankle Pain)               Treatment Diagnosis:    Difficulty walking  Pain in left ankle and joints of left foot  Medical/Referring Diagnosis:    Strain of left ankle, initial encounter [D03.087J]  Chronic bilateral low back pain without sciatica [M54.50, G89.29]      Referring Physician:  Gideon Peters, PA  MD Orders:  PT Eval and Treat  Return MD Appt:  TBD   Date of Onset:  Onset Date: 02/10/24 (Incident occured 6 weeks ago)     Allergies:   Cephalexin, Nitrofurantoin, Sulfa antibiotics, Pentazocine-naloxone hcl, Adhesive tape, and Codeine  Restrictions/Precautions:   None      Interventions Planned (Treatment may consist of any combination of the following):     See Assessment Note    Subjective Comments:   Patient reported left ankle pain but no swelling.   Initial Pain Level::     3/10  Post Session Pain Level:       2/10  Medications Last Reviewed:  04/07/2024  Updated Objective Findings:  Pt. Demonstrated improved gait and balance today on left LE  Treatment   THERAPEUTIC EXERCISE: (38 minutes):    Exercises per grid below to improve mobility, strength, balance, and coordination.  Required moderate visual, verbal, manual, and tactile cues to promote proper body alignment, promote proper body posture,  promote proper body mechanics, and promote proper body breathing techniques.  Progressed resistance, range, repetitions, and complexity of movement as indicated.   Date:  03/24/2024 Date:  04/07/24 Date:     Activity/Exercise Parameters Parameters Parameters   Heel Raises Seated EOB, 2x10 Seated EOB 2x10 reps     Ankle Isometrics Seated inversion against rolled towel, 10x3 hold Against therapist x 10 reps each 3 sec hold each     Ankle 4 way   Supine with BLE's on wedge x 10 reps each direction red band    BAPPS board  Level 2 seated in all 4 directions slow and gentle x 5 mins total     Single leg stance   Blue cushion standing at half wall x 3 mins total     Tandem stance and gait at half wall  X 3 mins total 10 ft. X 4 reps touching wall finger tips     Ankle pumps   X 20 reps each in supine with BLE's on wedge     CW & CCW circles   X 20 reps each supine with BLE's on wedge    Nu step   X 10 mins level 4     Side stepping  10  ft. X 4 at half wall.    Sit to stand   At mat table x 10 reps     Education Treatment, home exercise plan, plan of care, prognosis, pain modulation  Safety with exercises and balance.     Medbridge Access Code: [email or web address]    Manual:  ( x 15 mins) Pt. Received soft tissue mobilization to left ankle, dorsal, and plantar surfaces of left foot to decrease tightness and pain to left ankle/foot.     Modalities: X 8 mins pt. Received ice and elevation to left foot/ankle to decrease potential swelling and pain after exercises.         Treatment/Session Summary:    Treatment Assessment:   See evaluation note from today for objective data. Patient tolerated interventions well. They understood all HEP exercises.    Communication/Consultation:  Therapy Evaluation sent to referring provider  Equipment provided today:  HEP and Theraband  Recommendations/Intent for next treatment session: Next visit will focus on advancements to more challenging activities to include progressing strength, functional  mobility, pain tolerance, and ROM as tolerated.    >Total Treatment Billable Duration: 53 mins   Time In: 1100  Time Out: 1205    Zenab Gronewold M Jovan Schickling, PTA         Charge Capture  Events  MedBridge Portal  Appt Desk  Attendance Report     Future Appointments   Date Time Provider Department Center   04/11/2024 11:00 AM Elisha Faden, PT [facility]   08/11/2024 10:15 AM [facility] LAB [facility]   08/25/2024 10:00 AM Bailey-Nutting, Deland, PA [facility]

## 2024-04-11 ENCOUNTER — Inpatient Hospital Stay: Admit: 2024-04-11 | Payer: Medicare (Managed Care) | Primary: Medical

## 2024-04-11 NOTE — Progress Notes (Signed)
 Christy Cochran  DOB: 1955/11/27  Primary: [facility] (Medicare Managed)  Secondary:  [facility] [place]  [address]  [facility] [employer]  [place] [address]  Phone: [phone]  Fax: [phone] Plan Frequency: 1-2 sessions per week for 6 weeks  Plan of Care/Certification Expiration Date: 05/24/24        Plan of Care/Certification Expiration Date:  Plan of Care/Certification Expiration Date: 05/24/24    Frequency/Duration: Plan Frequency: 1-2 sessions per week for 6 weeks      Time In/Out:   Time In: 0900  Time Out: 1000      PT Visit Info:    Progress Note Counter: 2      Visit Count:  3    OUTPATIENT PHYSICAL THERAPY:   Treatment Note 04/11/2024       Episode  (L Ankle Pain)               Treatment Diagnosis:    Difficulty walking  Pain in left ankle and joints of left foot  Medical/Referring Diagnosis:    Strain of left ankle, initial encounter [D03.087J]  Chronic bilateral low back pain without sciatica [M54.50, G89.29]      Referring Physician:  Gideon Peters, PA  MD Orders:  PT Eval and Treat  Return MD Appt:  TBD   Date of Onset:  Onset Date: 02/10/24 (Incident occured 6 weeks ago)     Allergies:   Cephalexin, Nitrofurantoin, Sulfa antibiotics, Pentazocine-naloxone hcl, Adhesive tape, and Codeine  Restrictions/Precautions:   None      Interventions Planned (Treatment may consist of any combination of the following):     See Assessment Note    Subjective Comments:   Patient reports her ankle is feeling better and is able to walk and stand for longer durations  Initial Pain Level::     2/10  Post Session Pain Level:       2/10  Medications Last Reviewed:  04/11/2024  Updated Objective Findings:  Pain with L ankle anterior drawer test  Treatment   THERAPEUTIC EXERCISE: (53 minutes):    Exercises per grid below to improve mobility, strength, balance, and coordination.  Required moderate visual, verbal, manual, and tactile cues to promote proper body alignment,  promote proper body posture, promote proper body mechanics, and promote proper body breathing techniques.  Progressed resistance, range, repetitions, and complexity of movement as indicated.   Date:  03/24/2024 Date:  04/07/24 Date:  04/11/2024    Activity/Exercise Parameters Parameters Parameters   PROM   Manual Stretchinh of the R ankle, 10 minutes, DF and PF   Calf Stretch   3x30 with belt, long sitting    Heel Raises Seated EOB, 2x10 Seated EOB 2x10 reps  Seated EOB 2x10 reps    Ankle Isometrics Seated inversion against rolled towel, 10x3 hold Against therapist x 10 reps each 3 sec hold each  Long sitting, 3x10x3 squeezing yellow ball, heels propped   Ankle Strengthening  Supine with BLE's on wedge x 10 reps each direction red band Ankle DF, red band, 3x10x3 hold   BAPPS board  Level 2 seated in all 4 directions slow and gentle x 5 mins total  Level 2 seated in all 4 directions slow and gentle x 5 mins total    Single leg stance   Blue cushion standing at half wall x 3 mins total  Blue cushion standing at half wall x 3 mins total    Tandem stance and gait at half  wall  X 3 mins total 10 ft. X 4 reps touching wall finger tips     Ankle pumps   X 20 reps each in supine with BLE's on wedge     CW & CCW circles   X 20 reps each supine with BLE's on wedge    Nu step   X 10 mins level 4     Side stepping  10 ft. X 4 at half wall.    Sit to stand   At mat table x 10 reps     Education Treatment, home exercise plan, plan of care, prognosis, pain modulation  Safety with exercises and balance.     Medbridge Access Code: [email or web address]           Treatment/Session Summary:    Treatment Assessment:   Patient did well today. She demonstrates good tolerance to exercises and progressions. Heel raises are tolerable. She still reports lateral ankle pain with passive ankle inversion.   Communication/Consultation:  Therapy Evaluation sent to referring provider  Equipment provided today:  HEP and Theraband  Recommendations/Intent for  next treatment session: Next visit will focus on advancements to more challenging activities to include progressing strength, functional mobility, pain tolerance, and ROM as tolerated.    >Total Treatment Billable Duration: 53 minutes   Time In: 0900  Time Out: 1000    Curtistine Boss, PT         Charge Capture  Events  MedBridge Portal  Appt Desk  Attendance Report     Future Appointments   Date Time Provider Department Center   04/23/2024 10:00 AM Boss Curtistine, PT [facility]   05/07/2024 10:00 AM Boss Curtistine, PT [facility]   08/11/2024 10:15 AM [facility] LAB [facility]   08/25/2024 10:00 AM Bailey-Nutting, Deland, PA [facility]

## 2024-04-23 ENCOUNTER — Inpatient Hospital Stay: Payer: Medicare (Managed Care) | Primary: Medical

## 2024-04-23 ENCOUNTER — Encounter

## 2024-04-23 NOTE — Progress Notes (Signed)
 Reena Cap  DOB: 01-15-1956  Primary: [facility]  Secondary:  [facility] [place]  [address]  [facility] [employer]  [place] [address]  Phone: [phone]  Fax: [phone]    PT Visit Info:    Progress Note Counter: 2     OT Visit Info:  No data recorded    OUTPATIENT THERAPY: 04/23/2024  Episode  Appt Desk        Elmer Boutelle did not show for her appointment for today.  Will plan to follow up next during next appointment. Spoke with patient on the phone. Patient forgot about appointment.     Thank you,  Curtistine Boss, PT    Future Appointments   Date Time Provider Department Center   05/07/2024 10:00 AM Boss Curtistine, PT [facility]   08/11/2024 10:15 AM [facility] LAB [facility]   08/25/2024 10:00 AM Bailey-Nutting, Deland, PA [facility]

## 2024-04-24 NOTE — Telephone Encounter (Signed)
 Patient should call our office if refill is needed.

## 2024-05-07 ENCOUNTER — Inpatient Hospital Stay: Admit: 2024-05-07 | Payer: Medicare (Managed Care) | Primary: Medical

## 2024-05-07 DIAGNOSIS — S96912A Strain of unspecified muscle and tendon at ankle and foot level, left foot, initial encounter: Principal | ICD-10-CM

## 2024-05-07 NOTE — Discharge Instructions (Signed)
 Christy Cochran  DOB: 05/02/1956  Primary: [facility] (Medicare Managed)  Secondary:  [facility] [place]  [address]  [facility]  [place] [address]  Phone: [phone]  Fax: [phone] Plan Frequency: 1-2 sessions per week for 6 weeks     Plan of Care/Certification Expiration Date: 05/24/24      Plan of Care/Certification Expiration Date:  Plan of Care/Certification Expiration Date: 05/24/24     Frequency/Duration: To be DC           PT Visit Info:         Visit Count:  1                 OUTPATIENT PHYSICAL THERAPY:             Initial Assessment 05/07/2024               Episode (L Ankle Pain)            Treatment Diagnosis:     Difficulty walking  Pain in left ankle and joints of left foot  Medical/Referring Diagnosis:    Strain of left ankle, initial encounter [D03.087J]  Chronic bilateral low back pain without sciatica [M54.50, G89.29]      Referring Physician:  Gideon Peters, PA    MD Orders:  PT Eval and Treat   Return MD Appt:  TBD  Date of Onset:  Onset Date: 02/10/24 (Incident occured 6 weeks ago)     Allergies:  Cephalexin, Nitrofurantoin, Sulfa antibiotics, Pentazocine-naloxone hcl, Adhesive tape, and Codeine  Restrictions/Precautions:    None  Medications Last Reviewed: 03/24/2024     SUBJECTIVE   History of Injury/Illness (Reason for Referral):  Christy Cochran has attended 1 physical therapy session including initial evaluation as of 03/24/2024. Christy Cochran is a 68 y.o. female with complaints of L ankle pain. Patient was gardening when a wild boar ran towards her which was being chased by a pitbull hunting dog. One of the animals struck her leg and may have caused her to twist her ankle. This occurred about 6 weeks ago. Patient reports the pain is at the L lateral ankle and lower leg. She also reports low back pain which radiates up her back and down her leg. she is not certain if it is coming from her low back or her ankle.   Patient  Stated Goal(s):  Patient wishes to return to cleaning her home without pain  Initial Pain Level:      2/10   Post Session Pain Level:     2/10  Past Medical History/Comorbidities:   Christy Cochran  has a past medical history of Anxiety, Depression, Diabetes mellitus (HCC), Fibromyalgia, Hyperlipidemia, Meniere disease, Osteoporosis, and Restless leg.  Christy Cochran  has a past surgical history that includes Wrist fusion (11/2020); Bunionectomy; Cholecystectomy; Shoulder Arthrotomy; Hysterectomy, total abdominal; Tubal ligation; and Breast enhancement surgery.  Social History/Living Environment:   Patient lives with their family  Type of Home: House: One Story    Prior Level of Function/Work/Activity:   Prior Level of Function: Independent   Current Level of Function: Independent            OBJECTIVE                      ROM    Date:  03/24/2024   ANKLE ROM (TESTED IN SUPINE)     RIGHT LEFT   DF +5 +5   PF 50  50   IV 45 45   EV 10 10      STRENGTH    Date: 03/24/2024       Right Left   Hip Abduction 5 5   Hip IR 5 5   Hip ER 5 5   Hip Flexion 5 5   Knee Extension 5 5   Knee Flexion 5 5   Ankle DF 5 5   Ankle PF 5 5   Ankle IV 5 5   Ankle EV 5 5, mild pain      BALANCE    Date: 03/24/2024   Right >5 seconds   Left >5 seconds      EDEMA    Date:  03/24/2024        Activity/Exercise Left  Right   Figure 8 49 cm 49 cm      FUNCTIONAL MOBILITY    Date:   03/24/2024   Transfers Independent    Gait deviations No deficits   Assistive device None   Stairs No deficits         Outcome Measure:   Tool Used: FOOT AND ANKLE ABILITY MEASURE  Score:  Initial: 58 Most Recent: 69 (Date: 05/07/2024)   Interpretation of Score: For the Activities of Daily Living, there are 21 questions each scored on a 5 point scale with 0 representing Unable to do and 4 representing No difficulty.  The lower the score, the greater the functional disability. 84/84 represents no disability.  Minimal detectable change is 5.7 points.  With the addition of the 8  questions in the Sports Subscale, there are 29 questions, each scored on a 5 point scale with 0 representing Unable to do and 4 representing No difficulty.  The lower the score, the greater the functional disability. 116/116 represents no disability.  Minimal detectable change is 12.3 points.     ASSESSMENT   Progress Note 05/07/2024:   Christy Cochran reports their symptoms have improved by 85% since beginning physical therapy. Patient has made improvements to ankle ROM, strength, mobility and ambulation on even and uneven terrain. Christy Cochran still presents with mild discomfort in the lateral ankle when on her feet for long durations and when working on her yard. Christy Cochran has met 10 out of 10 goals since their initial evaluation. Christy Cochran is appropriate and agreeable to be discharged at this time. They will continue with an independent HEP which has been provided to them.       Initial Assessment:    Christy Cochran has attended 1 physical therapy session including initial evaluation as of 03/24/2024. Christy Cochran is a 68 y.o. female  presents with increased L ankle pain, decreased ankle ROM, decreased strength, decreased functional mobility, and decreased pain tolerance. Patient reports pain with ankle inversion and eversion. Her L anterlolateral ankle is tender to palpation. There is mild swelling at the ATFL site.  Christy Cochran will benefit from a home exercise program, therapeutic activities, postural strengthening exercises, manual therapeutic techniques, modalities, and pain modulation interventions as appropriate to address their current condition.  Christy Cochran will benefit from skilled physical therapy (medically necessary) to address above deficits affecting participation in basic ADLs and overall functional tolerance.         PLAN   Effective Dates: 03/24/2024 TO Plan of Care/Certification Expiration Date: 05/24/24  Frequency/Duration: To be DC      Interventions Planned (Treatment may  consist of any combination of the following):    Location manager, Endurance  Training, Building services engineer, Investment banker, operational, Home Exercise Program (HEP), Manual Therapy, Neuromuscular Re-education/Strengthening, Pain Management, Range of Motion (ROM), Therapeutic Activites, Therapeutic Exercise/Strengthening, and Dry Needling         GOALS:   (Goals have been discussed and agreed upon with patient.) STATUS AS OF   03/24/2024    Short-term Goals : 4 weeks  PROGRESSING MET NOT MET   Lianah Peed will be compliant with home exercise program within 4 weeks in order to improve active participation with management of patient's symptoms and/or functional deficits.  []   [x]   []     Natally Ribera will report <=3/10 pain with walking >15 minutes in order to participate in daily exercise and daily activities.  []   [x]   []     Adrijana Haros will be able to stand >=15 minutes with <2/10 pain to feet in order to participate in household duties without issues/compromise. []   [x]   []     Nieve Rojero will report being able to sleep through the night without waking up secondary to L foot pain. []   [x]   []     Chiamaka Latka  will improve L ankle dorsiflexion AROM to +5 in order to show improvement in ankle ROM and tolerance for functional activity.  []   [x]   []     Kassy Mcenroe will be report improved score on the Foot and Ankle Ability Measure from 58 to 63 to indicate improvement in functional independence. []   [x]   []     Discharge Goals: 12 weeks       Shakesha Soltau will report <=2/10 pain with participation in activities of daily living and overall functional mobility including walking and stair ambulation. []   [x]   []     Briseyda Fehr will be able to stand >=30 minutes without reports of increased foot/ankle pain. []   [x]   []     Debhora Titus will be report improved score on the Foot and Ankle Ability Measure from 58 to 68 to indicate improvement in functional independence. []   [x]   []     Journe Hallmark will improve ankle  strength to >=4+/5 to improve tolerance of ADLs and improve overall functional mobility. []   [x]   []                  Regarding Cookie Pore therapy, I certify that the treatment plan above will be carried out by a therapist or under their direction.  Thank you for this referral,  Curtistine Boss, PT     Referring Physician Signature: Gideon Peters* No signature required     Charge Capture  Events  Appt Desk  Attendance Report

## 2024-05-07 NOTE — Progress Notes (Signed)
 Christy Cochran  DOB: [birth year]  Primary: [facility] (Medicare Managed)  Secondary:  [facility] [place]  [address]  [facility] [employer]  [place] [address]  Phone: [phone]  Fax: [phone]             Plan of Care/Certification Expiration Date:   Plan of Care/Certification Expiration Date: 05/24/24     Frequency/Duration:      To be DC  Time In/Out:   Time In: 1005  Time Out: 1104      PT Visit Info:           Visit Count:  2    OUTPATIENT PHYSICAL THERAPY:   Treatment Note 05/07/2024       Episode  (L Ankle Pain)               Treatment Diagnosis:    Difficulty walking  Pain in left ankle and joints of left foot  Medical/Referring Diagnosis:    Strain of left ankle, initial encounter [D03.087J]  Chronic bilateral low back pain without sciatica [M54.50, G89.29]      Referring Physician:  Gideon Peters, PA  MD Orders:  PT Eval and Treat  Return MD Appt:  TBD   Date of Onset:  Onset Date: 02/10/24 (Incident occured 6 weeks ago)     Allergies:   Cephalexin, Nitrofurantoin, Sulfa antibiotics, Pentazocine-naloxone hcl, Adhesive tape, and Codeine  Restrictions/Precautions:   None      Interventions Planned (Treatment may consist of any combination of the following):     See Assessment Note    Subjective Comments:   Patient reports her ankle is feeling the same and not improving.   Initial Pain Level::     2/10  Post Session Pain Level:       2/10  Medications Last Reviewed:  05/07/2024  Updated Objective Findings:  See Discharge Note from today  Treatment   THERAPEUTIC EXERCISE: (40 minutes):    Exercises per grid below to improve mobility, strength, balance, and coordination.  Required moderate visual, verbal, manual, and tactile cues to promote proper body alignment, promote proper body posture, promote proper body mechanics, and promote proper body breathing techniques.  Progressed resistance, range, repetitions, and complexity of movement as indicated.    Date:  05/07/2024   Activity/Exercise Parameters   NuStep 5 minutes for ROM and muscular endurance    PROM 5 minutes, ankle PF/DF, inversion, eversion   Calf Stretch  3x30 stretch with strap   Ankle Strength PF, black band, 3x10    DF, green band, 3x10                                           Medbridge Access Code: [email or web address]       MANUAL THERAPY: (13 minutes):   Joint mobilization and Soft tissue mobilization was utilized and necessary because of the patient's restricted joint motion, loss of articular motion, and restricted motion of soft tissue.  Soft tissue mobilization of gastroc soleus musculature.   Talocrural joint mobility for DF and PF      Treatment/Session Summary:    Treatment Assessment:   Patient did well today. See DC note from today.   Communication/Consultation:  None today  Equipment provided today:  HEP  Recommendations/Intent for next treatment session: to be DC    >Total Treatment Billable Duration: 53 minutes   Time  In: 1005  Time Out: 1104    Curtistine Boss, PT         Charge Capture  Events  Med[employer] Portal  Appt Desk  Attendance Report     Future Appointments   Date Time Provider Department Center   08/11/2024 10:15 AM [facility]   08/25/2024 10:00 AM Bailey-Nutting, Maureen, PA [facility]

## 2024-07-22 ENCOUNTER — Encounter

## 2024-07-23 MED ORDER — ROSUVASTATIN CALCIUM 40 MG PO TABS
40 | ORAL_TABLET | Freq: Every day | ORAL | 0 refills | 90.00000 days | Status: DC
Start: 2024-07-23 — End: 2024-08-25

## 2024-08-06 ENCOUNTER — Encounter

## 2024-08-11 ENCOUNTER — Encounter: Payer: Medicare (Managed Care) | Primary: Medical

## 2024-08-14 ENCOUNTER — Encounter: Admit: 2024-08-14 | Discharge: 2024-08-14 | Payer: Medicare (Managed Care) | Primary: Medical

## 2024-08-14 ENCOUNTER — Encounter

## 2024-08-15 LAB — COMPREHENSIVE METABOLIC PANEL
ALT: 46 U/L — ABNORMAL HIGH (ref 8–45)
AST: 27 U/L (ref 15–37)
Albumin/Globulin Ratio: 1.6 (ref 1.0–1.9)
Albumin: 3.8 g/dL (ref 3.2–4.6)
Alk Phosphatase: 90 U/L (ref 35–104)
Anion Gap: 9 mmol/L (ref 7–16)
BUN: 16 mg/dL (ref 8–23)
CO2: 27 mmol/L (ref 20–29)
Calcium: 9.4 mg/dL (ref 8.8–10.2)
Chloride: 104 mmol/L (ref 98–107)
Creatinine: 1 mg/dL (ref 0.60–1.10)
Est, Glom Filt Rate: 62 ml/min/1.73m2 (ref >60)
Globulin: 2.5 g/dL (ref 2.3–3.5)
Glucose: 107 mg/dL — ABNORMAL HIGH (ref 70–99)
Potassium: 3.9 mmol/L (ref 3.5–5.1)
Sodium: 140 mmol/L (ref 136–145)
Total Bilirubin: 0.6 mg/dL (ref 0.0–1.2)
Total Protein: 6.3 g/dL (ref 6.3–8.2)

## 2024-08-15 LAB — LIPID PANEL
Chol/HDL Ratio: 1.9 (ref 0.0–5.0)
Cholesterol, Total: 158 mg/dL (ref 0–200)
HDL: 81 mg/dL — ABNORMAL HIGH (ref 40–60)
LDL Cholesterol: 61 mg/dL (ref 0–100)
Triglycerides: 80 mg/dL (ref 0–150)
VLDL Cholesterol Calculated: 16 mg/dL (ref 6–23)

## 2024-08-15 LAB — VITAMIN D 25 HYDROXY: Vit D, 25-Hydroxy: 64.5 ng/mL (ref 30.0–100.0)

## 2024-08-15 LAB — HEMOGLOBIN A1C
Estimated Avg Glucose: 152 mg/dL
Hemoglobin A1C: 6.9 % — ABNORMAL HIGH (ref 0–5.6)

## 2024-08-22 ENCOUNTER — Encounter

## 2024-08-25 ENCOUNTER — Encounter

## 2024-08-25 ENCOUNTER — Ambulatory Visit: Admit: 2024-08-25 | Discharge: 2024-08-25 | Payer: Medicare (Managed Care) | Attending: Medical | Primary: Medical

## 2024-08-25 VITALS — BP 108/70 | HR 72 | Temp 98.40000°F | Resp 18 | Ht 64.0 in | Wt 153.0 lb

## 2024-08-25 DIAGNOSIS — I10 Essential (primary) hypertension: Principal | ICD-10-CM

## 2024-08-25 MED ORDER — AMOXICILLIN-POT CLAVULANATE 875-125 MG PO TABS
875-125 | ORAL_TABLET | Freq: Two times a day (BID) | ORAL | 0 refills | 8.00000 days | Status: DC
Start: 2024-08-25 — End: 2024-08-25

## 2024-08-25 MED ORDER — MELOXICAM 7.5 MG PO TABS
7.5 | ORAL_TABLET | Freq: Every day | ORAL | 0 refills | 30.00000 days | Status: AC
Start: 2024-08-25 — End: 2024-09-15

## 2024-08-25 MED ORDER — VITAMIN D (ERGOCALCIFEROL) 1.25 MG (50000 UT) PO CAPS
1.25 | ORAL_CAPSULE | ORAL | 1 refills | 84.00000 days | Status: AC
Start: 2024-08-25 — End: ?

## 2024-08-25 MED ORDER — GABAPENTIN 300 MG PO CAPS
300 | ORAL_CAPSULE | ORAL | 1 refills | 30.00000 days | Status: AC
Start: 2024-08-25 — End: 2025-02-23

## 2024-08-25 MED ORDER — PRAMIPEXOLE DIHYDROCHLORIDE 0.125 MG PO TABS
0.125 | ORAL_TABLET | ORAL | 1 refills | 90.00000 days | Status: AC
Start: 2024-08-25 — End: ?

## 2024-08-25 MED ORDER — SEMAGLUTIDE (2 MG/DOSE) 8 MG/3ML SC SOPN
8 | SUBCUTANEOUS | 1 refills | 28.00000 days | Status: AC
Start: 2024-08-25 — End: ?

## 2024-08-25 MED ORDER — VENLAFAXINE HCL ER 150 MG PO CP24
150 | ORAL_CAPSULE | Freq: Every day | ORAL | 1 refills | 90.00000 days | Status: AC
Start: 2024-08-25 — End: ?

## 2024-08-25 MED ORDER — AMOXICILLIN-POT CLAVULANATE 875-125 MG PO TABS
875-125 | ORAL_TABLET | Freq: Two times a day (BID) | ORAL | 0 refills | 8.00000 days | Status: AC
Start: 2024-08-25 — End: 2024-09-04

## 2024-08-25 MED ORDER — ROSUVASTATIN CALCIUM 40 MG PO TABS
40 | ORAL_TABLET | Freq: Every day | ORAL | 1 refills | 90.00000 days | Status: AC
Start: 2024-08-25 — End: ?

## 2024-08-25 MED ORDER — HYDROCHLOROTHIAZIDE 12.5 MG PO TABS
12.5 | ORAL_TABLET | ORAL | 1 refills | 90.00000 days | Status: AC
Start: 2024-08-25 — End: ?

## 2024-08-25 MED ORDER — FARXIGA 5 MG PO TABS
5 | ORAL_TABLET | Freq: Every morning | ORAL | 1 refills | 30.00000 days | Status: AC
Start: 2024-08-25 — End: ?

## 2024-08-25 MED ORDER — MELOXICAM 7.5 MG PO TABS
7.5 | ORAL_TABLET | Freq: Every day | ORAL | 0 refills | 30.00000 days | Status: DC
Start: 2024-08-25 — End: 2024-08-25

## 2024-08-25 NOTE — Progress Notes (Signed)
 Have you been to the ER, urgent care clinic since your last visit?  Hospitalized since your last visit?   NO    Have you seen or consulted any other health care providers outside our system since your last visit?   NO      "Have you had a diabetic eye exam?"    NO     No diabetic eye exam on file

## 2024-08-25 NOTE — Progress Notes (Signed)
 "Christy Cochran (DOB: 12-18-1955) is a 68 y.o. female, an established patient, is here for evaluation of the following chief complaint(s):  Chief Complaint   Patient presents with    Hypertension    Congestion     Sinus congestion and pressure     Ear Pain     Right ear - hurting for the lat month    Wrist Pain     Right wrist - started a year ago but has gotten worse over the last 4 months     Diabetes    Hyperlipidemia          ASSESSMENT/PLAN:     Diagnosis Orders   1. Hypertension, essential, benign  hydroCHLOROthiazide  12.5 MG tablet    Comprehensive Metabolic Panel    Albumin/Creatinine Ratio, Urine      2. Type 2 diabetes mellitus with diabetic neuropathy, without long-term current use of insulin  (HCC)  FARXIGA  5 MG tablet    semaglutide , 2 MG/DOSE, (OZEMPIC ) 8 MG/3ML SOPN sc injection    Comprehensive Metabolic Panel    Hemoglobin A1C    Lipid Panel    Albumin/Creatinine Ratio, Urine      3. Vitamin D  deficiency  vitamin D  (ERGOCALCIFEROL ) 1.25 MG (50000 UT) CAPS capsule      4. Dyslipidemia  rosuvastatin  (CRESTOR ) 40 MG tablet    Comprehensive Metabolic Panel    Lipid Panel      5. Acute bacterial sinusitis  amoxicillin -clavulanate (AUGMENTIN ) 875-125 MG per tablet    DISCONTINUED: amoxicillin -clavulanate (AUGMENTIN ) 875-125 MG per tablet      6. Right wrist pain  meloxicam (MOBIC) 7.5 MG tablet    DISCONTINUED: meloxicam (MOBIC) 7.5 MG tablet      7. Fibromyalgia  gabapentin  (NEURONTIN ) 300 MG capsule      8. Neuropathy  gabapentin  (NEURONTIN ) 300 MG capsule      9. Meniere's disease of both ears  hydroCHLOROthiazide  12.5 MG tablet      10. Restless leg syndrome  pramipexole  (MIRAPEX ) 0.125 MG tablet      11. Anxiety state  venlafaxine  (EFFEXOR  XR) 150 MG extended release capsule      12. Encounter for immunization  Influenza, FLUAD Trivalent, (age 39 y+), IM, Preservative Free, 0.5mL            Assessment & Plan  1. Sinus infection:  - Symptoms include congestion and ear pain radiating up through the head  and down behind the ear, with tenderness on the right side.  - Examination revealed partial obstruction due to wax and significant congestion likely due to allergies.  - Augmentin  will be prescribed twice daily for 10 days, to be taken with food to avoid gastrointestinal upset.    2. Right wrist pain:  - Persistent pain in the right wrist that started about a year ago and has recently worsened. There is no history of injury.  - Meloxicam 7.5 mg will be prescribed, to be taken once daily for 21 days. If there is no improvement after 3 weeks, a referral to a hand specialist will be considered.    3. Hypertension:  - Blood pressure is well controlled on the current regimen of hydrochlorothiazide  12.5 mg daily.  - The patient has tolerated the medication well.  - A prescription for hydrochlorothiazide  will be sent to the pharmacy.    4. Hyperlipidemia:  - Cholesterol levels have significantly improved with rosuvastatin  (Crestor ) therapy, but the risk of myocardial infarction remains higher than desired.  - Zetia will be added to  the current regimen to further reduce cardiovascular risk.  - The patient will continue taking rosuvastatin .    5. Vitamin D  deficiency:  - Vitamin D  levels have improved with supplementation.  - The patient will continue taking over-the-counter vitamin D3 at either 2000 IU or 4000 IU daily and will confirm the dosage via MyChart.  - A prescription for once-weekly vitamin D  will be sent to the pharmacy.    6. Diabetes mellitus:  - Hemoglobin A1c is stable and well controlled, though still in the diabetic range.  - The patient is advised to continue monitoring sugar and carbohydrate intake.  - Prescriptions for Farxiga  and Ozempic  will be sent to the pharmacy.    7. Fibromyalgia:  - The patient reports a slight increase in pain despite taking gabapentin  300 mg twice daily.  - The current treatment plan will be maintained to avoid overmedication and potential side effects.    8. Restless leg  syndrome:  - Symptoms are well controlled with Mirapex  as long as the medication is taken on time.    9. Anxiety:  - Anxiety is well controlled with venlafaxine  (Effexor ).  - A prescription for venlafaxine  will be sent to the pharmacy.    10. Adrenal adenoma:  - A CT scan in July 2025 revealed an incidental finding of an adrenal adenoma. Repeat imaging will be scheduled for July 2026 to monitor the adenoma.    11. Health maintenance:  - The patient will receive an influenza vaccine today. The patient is also advised to schedule a diabetic eye exam.    12. H/o fluid on her heart:  - The patient had an episode of fluid buildup a few years ago and was told she had congestive heart failure. However, about a year later, she saw a different cardiologist who said she does not have congestive heart failure.  - As long as she takes dandelion root extract, she does not have any problems with fluid buildup.        Physical Exam  Constitutional:       Appearance: Normal appearance. She is normal weight.   HENT:      Head: Normocephalic and atraumatic. Head contusion: increased cerumen in Rt. EAC, partially obstructing view.      Comments: Right maxillary sinus tenderness.  Nasal turbinates are pale and boggy in appearance.       Right Ear: Tympanic membrane, ear canal and external ear normal.      Left Ear: Tympanic membrane, ear canal and external ear normal. There is no impacted cerumen.      Mouth/Throat:      Mouth: Mucous membranes are moist.   Eyes:      Pupils: Pupils are equal, round, and reactive to light.   Cardiovascular:      Rate and Rhythm: Normal rate and regular rhythm.      Heart sounds: Normal heart sounds.   Pulmonary:      Effort: Pulmonary effort is normal.      Breath sounds: Normal breath sounds.   Skin:     General: Skin is warm and dry.   Neurological:      General: No focal deficit present.      Mental Status: She is alert and oriented to person, place, and time.   Psychiatric:         Mood and Affect:  Mood normal.         Behavior: Behavior normal.         Thought  Content: Thought content normal.         Judgment: Judgment normal.           Follow Up    Return for 24 weeks AWV/HTN/Diabetes mgt w/ labs prior to visit.     SUBJECTIVE/OBJECTIVE:      History of Present Illness  The patient presents for evaluation of sinus infection, right wrist pain, hypertension, hyperlipidemia, vitamin D  deficiency, diabetes mellitus, fibromyalgia, restless leg syndrome, anxiety, and adrenal adenoma.    At her visit On 03/05/24, the following was discussed:      Hypertension.  - Blood pressure readings have been consistently low, likely due to weight loss reducing the need for antihypertensive medications.  - Physical exam: BP 112/78.  - Discussion: Losartan  will be discontinued while continuing hydrochlorothiazide  for Meniere's disease.  - Treatment: If low blood pressure readings persist after stopping losartan , she should inform us .    Hyperlipidemia.  - Cholesterol levels have increased significantly, with a current reading of 276 mg/dL.  - Physical exam: No new findings.  - Discussion: Will resume rosuvastatin  40 mg daily.    Vitamin D  deficiency.  - Vitamin D  level is at the low end of the normal range at 38.6 ng/mL.  - Physical exam: No new findings.  - Discussion: Will restart vitamin D  50,000 international units once a week and continue over-the-counter vitamin D3 2000 IU daily.    At today's visit:     She has been experiencing congestion and ear pain for approximately one month. The pain, described as sharp, radiates upwards and downwards behind her ear. It is constant and intensifies when she is exposed to cold temperatures. This is her first experience with such symptoms. She has an upcoming appointment with an ENT specialist in the first week of January. She has been using Flonase  nasal spray once daily, as twice-daily use results in soreness. She is also taking Claritin and has previously tried Singulair. She has  allergies to sulfa and nitrofurantoin but can tolerate Augmentin .    She has been experiencing right wrist pain for about a year, which has recently worsened. The pain extends down her arm. She reports no injury and has not consulted a hand specialist. She is not taking any anti-inflammatories. Her job involves constant cutting of potatoes, necessitating the use of a compression bandage.    She is currently on hydrochlorothiazide  12.5 mg daily for blood pressure management, which she tolerates well. She discontinued losartan  due to low blood pressure readings in 02/2024.    She is taking rosuvastatin  for cholesterol management. Her cholesterol levels have significantly improved with this medication.    She was previously diagnosed with vitamin D  deficiency and was advised to take vitamin D  supplements weekly. She is unsure of the dosage of her over-the-counter vitamin D3 supplement.    She is on Ozempic  and Farxiga  for diabetes management. She reports no issues with constipation, stomach pain, or nausea. She has not had a diabetic eye exam since her last visit.    She is taking gabapentin  300 mg twice daily for fibromyalgia and neuropathy in the lower legs. She has noticed a slight increase in pain, rating it at 4 or 5 out of 10 while on medication.    She is taking Mirapex  for restless leg syndrome, which is well controlled as long as she takes the medication on time.    She is taking venlafaxine  for anxiety, which is well controlled.    She had a CT scan  in 02/2024, which incidentally found an adenoma on her adrenal gland.    She had an episode of fluid buildup a few years ago and was told she had congestive heart failure. However, about a year later, she saw a different cardiologist who said she does not have congestive heart failure. As long as she takes dandelion root extract, she does not have any problems with fluid buildup.    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 [place]        Vitals:     08/25/24 1005   BP: 108/70   BP Site: Left Upper Arm   Patient Position: Sitting   BP Cuff Size: Medium Adult   Pulse: 72   Resp: 18   Temp: 98.4 F (36.9 C)   TempSrc: Oral   SpO2: 97%   Weight: 69.4 kg (153 lb)   Height: 1.626 m (5' 4)      Last Weight Metrics:      08/25/2024    10:05 AM 03/05/2024    10:28 AM 10/29/2023     9:52 AM 09/13/2023     2:32 PM 06/13/2023    10:02 AM 03/20/2023     8:37 AM 12/29/2022    10:57 AM   Weight Loss Metrics   Height 5' 4 5' 4 5' 4 5' 4 5' 4 5' 4 5' 4   Weight - Scale 153 lbs 145 lbs 13 oz 149 lbs 147 lbs 151 lbs 161 lbs 3 oz 164 lbs   BMI (Calculated) 26.3 kg/m2 25.1 kg/m2 25.6 kg/m2  25.3 kg/m2  26 kg/m2  27.7 kg/m2  28.2 kg/m2        Data saved with a previous flowsheet row definition         Orders Placed This Encounter    Influenza, FLUAD Trivalent, (age 84 y+), IM, Preservative Free, 0.5mL    Comprehensive Metabolic Panel     Standing Status:   Future     Expected Date:   02/02/2025     Expiration Date:   08/25/2025    Hemoglobin A1C     Standing Status:   Future     Expected Date:   02/02/2025     Expiration Date:   08/25/2025    Lipid Panel     Standing Status:   Future     Expected Date:   02/02/2025     Expiration Date:   08/25/2025    Albumin/Creatinine Ratio, Urine     Standing Status:   Future     Expected Date:   02/02/2025     Expiration Date:   08/25/2025    DISCONTD: amoxicillin -clavulanate (AUGMENTIN ) 875-125 MG per tablet     Sig: Take 1 tablet by mouth 2 times daily for 10 days     Dispense:  20 tablet     Refill:  0     Take w/ food    DISCONTD: meloxicam (MOBIC) 7.5 MG tablet     Sig: Take 1 tablet by mouth daily for 21 days     Dispense:  21 tablet     Refill:  0    FARXIGA  5 MG tablet     Sig: Take 1 tablet by mouth every morning     Dispense:  90 tablet     Refill:  1    gabapentin  (NEURONTIN ) 300 MG capsule     Sig: TAKE 1 CASPULE BY MOUTH TWICE DAILY     Dispense:  180  capsule     Refill:  1    hydroCHLOROthiazide  12.5 MG tablet     Sig: TAKE 1 TABLET BY  MOUTH EVERY DAY     Dispense:  90 tablet     Refill:  1     Please d/c any orders on file for HCTZ 25 mg    pramipexole  (MIRAPEX ) 0.125 MG tablet     Sig: TAKE 3 TABLETS BY MOUTH EVERY HS     Dispense:  270 tablet     Refill:  1    rosuvastatin  (CRESTOR ) 40 MG tablet     Sig: Take 1 tablet by mouth daily     Dispense:  90 tablet     Refill:  1    semaglutide , 2 MG/DOSE, (OZEMPIC ) 8 MG/3ML SOPN sc injection     Sig: Inject 2 mg into the skin every 7 days     Dispense:  9 mL     Refill:  1     Please d/c any orders on file for Trulicity     venlafaxine  (EFFEXOR  XR) 150 MG extended release capsule     Sig: Take 1 capsule by mouth daily     Dispense:  90 capsule     Refill:  1     Please place rx on file until pt. Requests refill.    vitamin D  (ERGOCALCIFEROL ) 1.25 MG (50000 UT) CAPS capsule     Sig: Take 1 capsule by mouth once a week     Dispense:  12 capsule     Refill:  1    meloxicam (MOBIC) 7.5 MG tablet     Sig: Take 1 tablet by mouth daily for 21 days     Dispense:  21 tablet     Refill:  0    amoxicillin -clavulanate (AUGMENTIN ) 875-125 MG per tablet     Sig: Take 1 tablet by mouth 2 times daily for 10 days     Dispense:  20 tablet     Refill:  0     Take w/ food         An electronic signature was used to authenticate this note.  -- Deland Robinsons, PA     Part of this note was written by using a voice dictation software. The note has been proof read but may still contain some grammatical/other typographical errors.   AND/OR:  The patient (or guardian, if applicable) and other individuals in attendance with the patient were advised that Artificial Intelligence will be utilized during this visit to record, process the conversation to generate a clinical note, and support improvement of the AI technology. The patient (or guardian, if applicable) and other individuals in attendance at the appointment consented to the use of AI, including the recording.            "

## 2024-09-17 ENCOUNTER — Encounter

## 2024-09-17 NOTE — Progress Notes (Signed)
 Referral placed to ortho for right wrist:  Dr. Alton in [place] (per pt request)

## 2024-09-27 ENCOUNTER — Encounter

## 2024-10-01 NOTE — Telephone Encounter (Signed)
 Called and spoke to patient to relay MyChart message, pt stated she has already seen Dr. Marijo last week and had a steroid injection done - which she states it did not help. Was told by Dr. Marijo that he did not see any tendonitis and she will follow up with him sometime next week.
